# Patient Record
Sex: Male | Born: 1945 | Race: White | Hispanic: No | Marital: Married | Smoking: Former smoker
Health system: Southern US, Community
[De-identification: ages and names within clinical notes are randomized; demographics above are authoritative.]

## PROBLEM LIST (undated history)

## (undated) DIAGNOSIS — I10 Essential (primary) hypertension: Secondary | ICD-10-CM

## (undated) DIAGNOSIS — E785 Hyperlipidemia, unspecified: Secondary | ICD-10-CM

## (undated) DIAGNOSIS — Q231 Congenital insufficiency of aortic valve: Secondary | ICD-10-CM

## (undated) DIAGNOSIS — R7301 Impaired fasting glucose: Secondary | ICD-10-CM

## (undated) DIAGNOSIS — E119 Type 2 diabetes mellitus without complications: Secondary | ICD-10-CM

## (undated) DIAGNOSIS — I712 Thoracic aortic aneurysm, without rupture: Secondary | ICD-10-CM

## (undated) DIAGNOSIS — Q2381 Bicuspid aortic valve: Secondary | ICD-10-CM

## (undated) DIAGNOSIS — R972 Elevated prostate specific antigen [PSA]: Secondary | ICD-10-CM

## (undated) DIAGNOSIS — B351 Tinea unguium: Secondary | ICD-10-CM

## (undated) DIAGNOSIS — R011 Cardiac murmur, unspecified: Secondary | ICD-10-CM

## (undated) DIAGNOSIS — K219 Gastro-esophageal reflux disease without esophagitis: Secondary | ICD-10-CM

## (undated) DIAGNOSIS — I7121 Aneurysm of the ascending aorta, without rupture: Secondary | ICD-10-CM

## (undated) HISTORY — DX: Bicuspid aortic valve: Q23.81

## (undated) HISTORY — DX: Essential (primary) hypertension: I10

## (undated) HISTORY — DX: Congenital insufficiency of aortic valve: Q23.1

## (undated) HISTORY — DX: Hyperlipidemia, unspecified: E78.5

## (undated) HISTORY — DX: Impaired fasting glucose: R73.01

## (undated) HISTORY — DX: Type 2 diabetes mellitus without complications: E11.9

## (undated) HISTORY — PX: CATARACT EXTRACTION, BILATERAL: SHX1313

## (undated) HISTORY — DX: Elevated prostate specific antigen (PSA): R97.20

## (undated) SURGERY — Surgical Case
Anesthesia: *Unknown

---

## 2009-04-02 ENCOUNTER — Encounter: Admission: RE | Admit: 2009-04-02 | Discharge: 2009-04-02 | Payer: Self-pay | Admitting: Cardiology

## 2009-04-02 IMAGING — CT CT ANGIO CHEST
3 of 5 series · 17 of 30 positions shown · IV contrast (agent unspecified)
Comparison: None

CLINICAL DATA: History of bicuspid aortic valve, evaluate for
aneurysm

CT ANGIOGRAPHY CHEST WITH CONTRAST
TECHNIQUE: Multidetector CT imaging of the chest was performed
using the standard protocol during bolus administration of
intravenous contrast. Multiplanar CT image reconstructions
including MIPs were obtained to evaluate the vascular anatomy.
Contrast: 100 ml [VD]

[Series 5: angio · axial · 0.78mm/px · z∈[-263,-33]mm · 7 of 124 slices shown]
[im 16/124  lung]
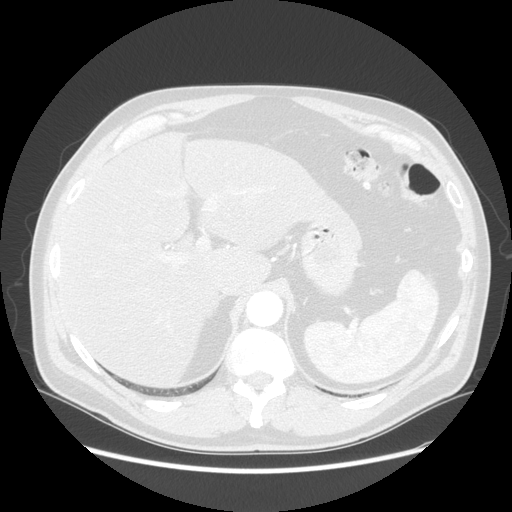
[im 31/124  mediastinal]
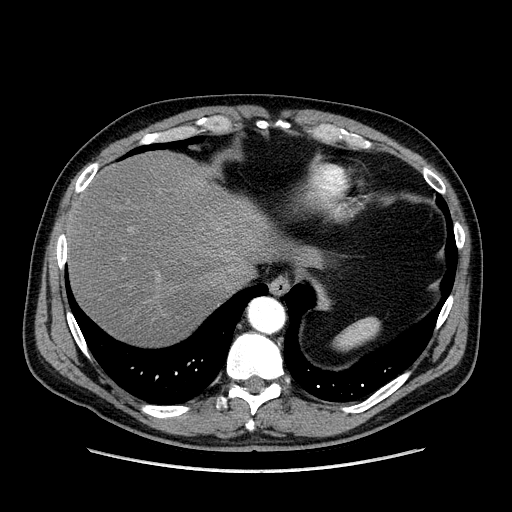
[im 47/124  lung]
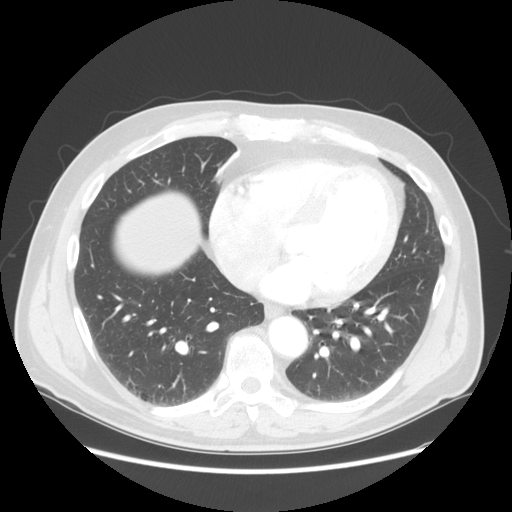
[im 62/124  mediastinal]
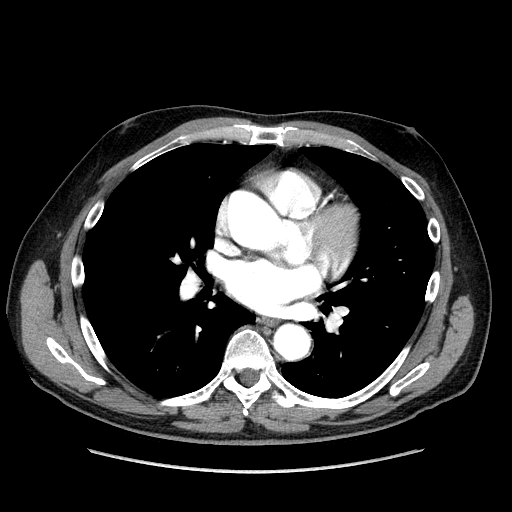
[im 77/124  lung]
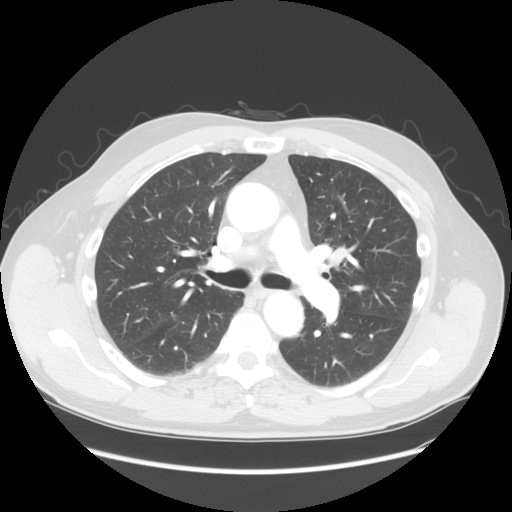
[im 93/124  mediastinal]
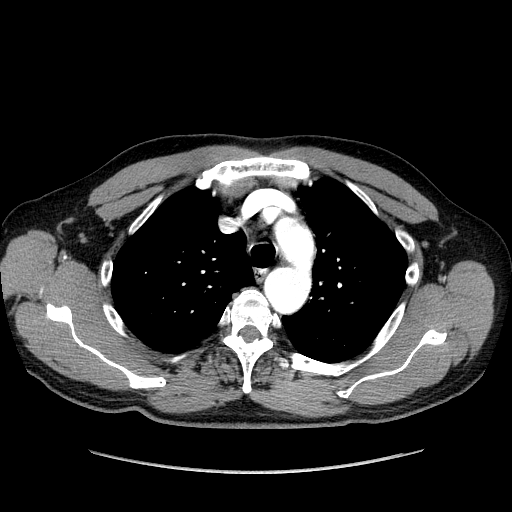
[im 108/124  lung]
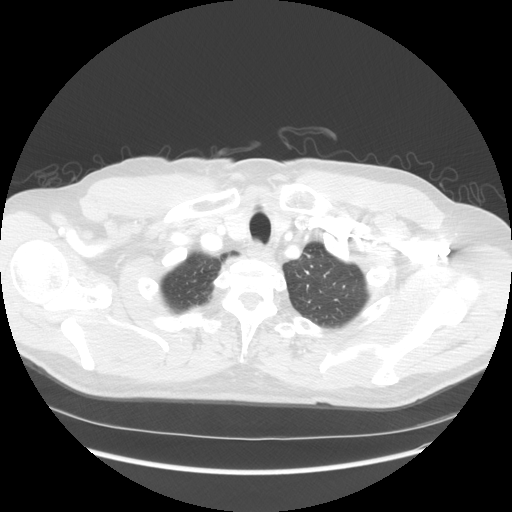

[Series 400: oblique · sagittal · 0.78mm/px · 2 of 50 slices shown]
[im 17/50  lung]
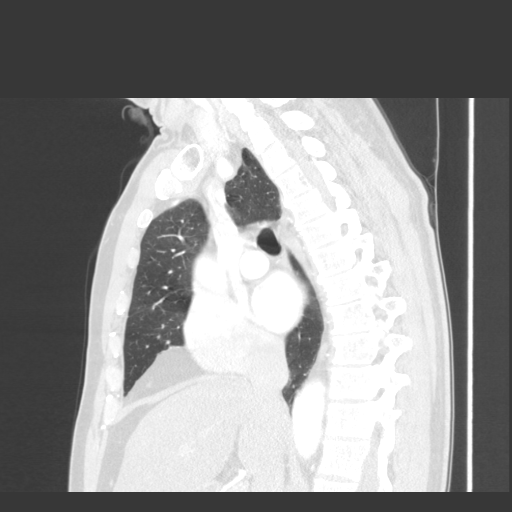
[im 33/50  lung]
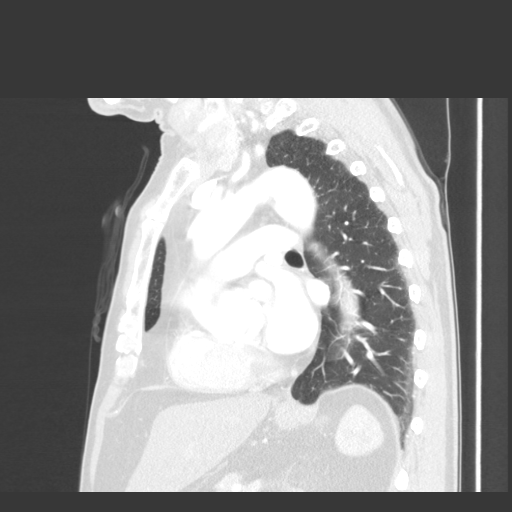

[Series 602: sagittal body · sagittal · 0.78mm/px · 8 of 161 slices shown]
[im 15/161  lung]
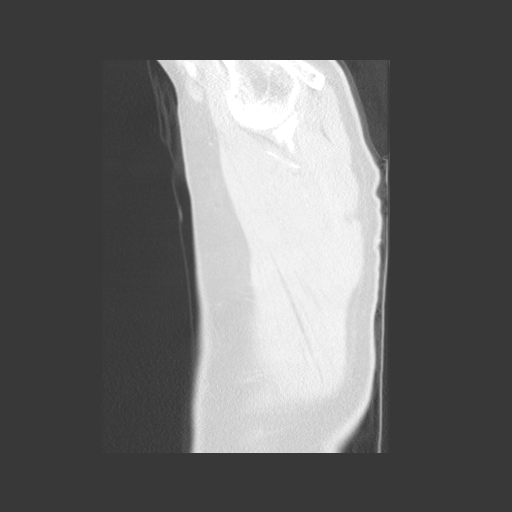
[im 30/161  lung]
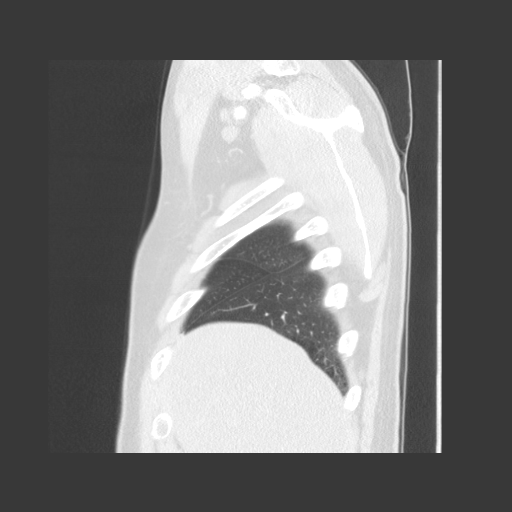
[im 59/161  lung]
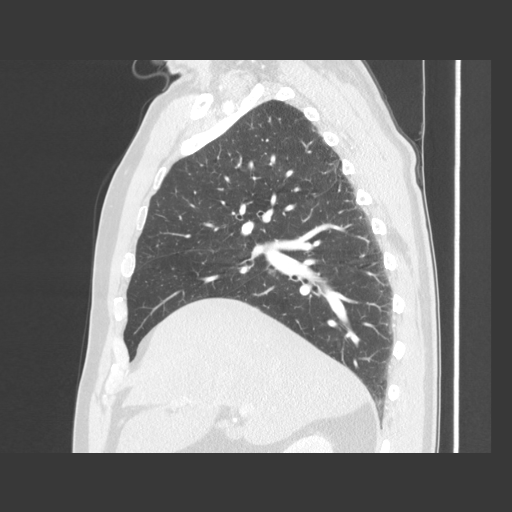
[im 73/161  lung]
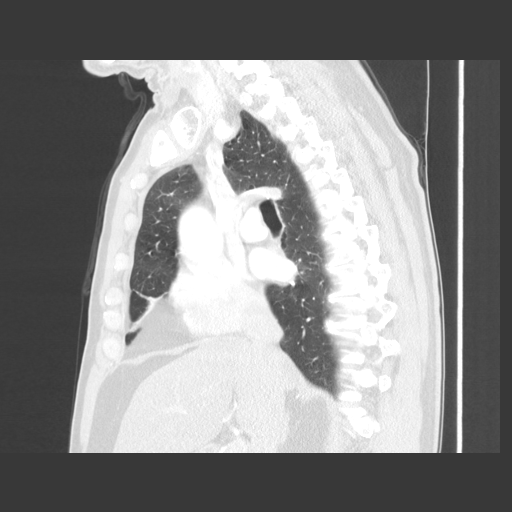
[im 88/161  lung]
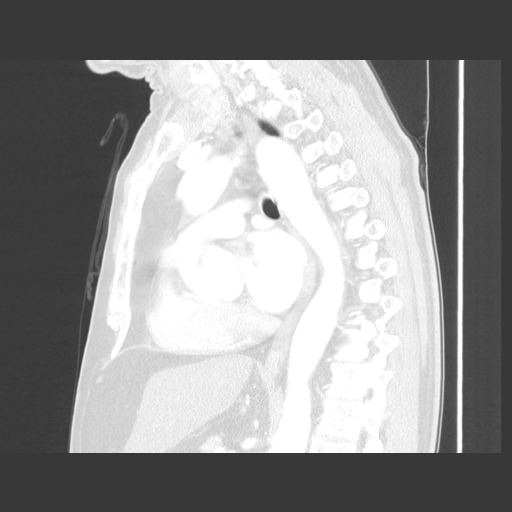
[im 102/161  lung]
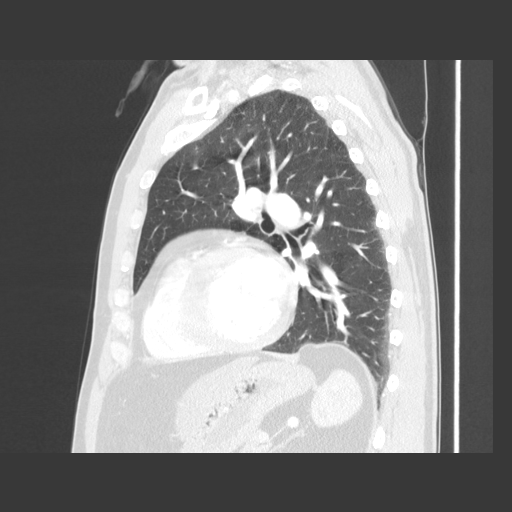
[im 131/161  lung]
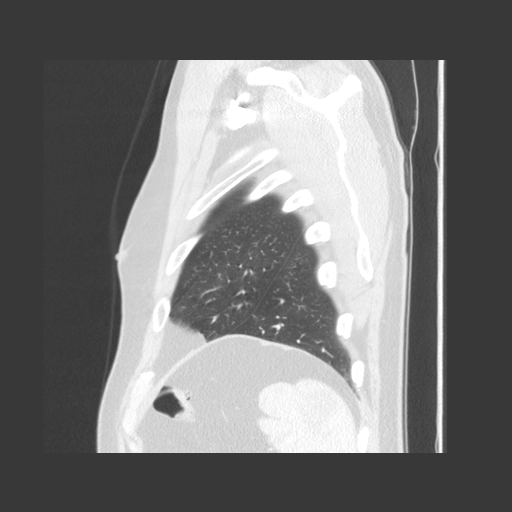
[im 146/161  lung]
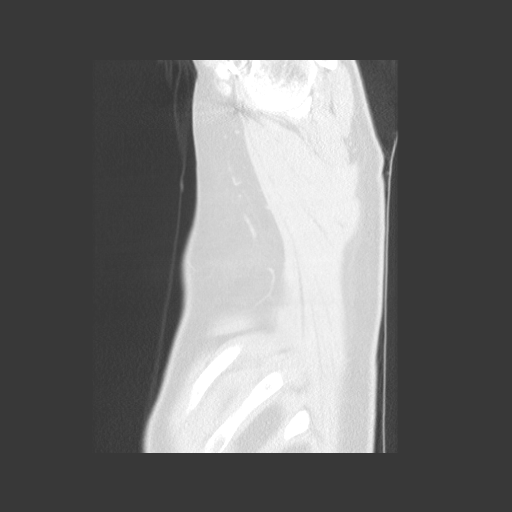

[17 of 30 positions shown; findings below may reference images not displayed]

FINDINGS: On the unenhanced study there is mild cardiomegaly
present with faint coronary artery calcification noted.  The liver
is somewhat low attenuation suggesting fatty infiltration.

The thoracic aorta opacifies well with no evidence of focal
aneurysm.  There is considerable cardiac motion obscuring detail at
the level of the aortic valve.  However no aneurysm is seen of the
sinus of Valsalva and the ascending aorta measures only 39 mm in
diameter at the level of the main pulmonary artery.  There is only
slight narrowing of the distal aortic arch but no definite evidence
of coarctation is identified.  The origins of the great vessels are
patent.  The descending thoracic aorta is normal in caliber.  The
pulmonary arteries opacify with no acute abnormality.

On the lung window images no lung parenchymal abnormality is seen.
No effusion is noted.  No bony abnormality is seen.

Review of the MIP images confirms the above findings.
IMPRESSION: 1.  No thoracic aortic aneurysm is seen.  The ascending aorta is
not significantly dilated. Cardiac motion does obscure the level of
the aortic valves.
2.  Minimal narrowing of the distal aortic large but no evidence of
coarctation is identified.

## 2015-09-20 DIAGNOSIS — Z1389 Encounter for screening for other disorder: Secondary | ICD-10-CM | POA: Insufficient documentation

## 2015-09-20 DIAGNOSIS — Z8042 Family history of malignant neoplasm of prostate: Secondary | ICD-10-CM | POA: Insufficient documentation

## 2015-09-23 ENCOUNTER — Telehealth: Payer: Self-pay | Admitting: Cardiology

## 2015-10-21 ENCOUNTER — Encounter: Payer: Self-pay | Admitting: Cardiology

## 2015-10-25 ENCOUNTER — Ambulatory Visit (INDEPENDENT_AMBULATORY_CARE_PROVIDER_SITE_OTHER): Payer: Medicare Other | Admitting: Cardiology

## 2015-10-25 ENCOUNTER — Encounter: Payer: Self-pay | Admitting: Cardiology

## 2015-10-25 VITALS — BP 130/58 | HR 51 | Ht 71.0 in | Wt 209.8 lb

## 2015-10-25 DIAGNOSIS — I10 Essential (primary) hypertension: Secondary | ICD-10-CM | POA: Insufficient documentation

## 2015-10-25 DIAGNOSIS — I350 Nonrheumatic aortic (valve) stenosis: Secondary | ICD-10-CM | POA: Insufficient documentation

## 2015-10-25 DIAGNOSIS — I351 Nonrheumatic aortic (valve) insufficiency: Secondary | ICD-10-CM

## 2015-10-25 DIAGNOSIS — R011 Cardiac murmur, unspecified: Secondary | ICD-10-CM | POA: Insufficient documentation

## 2015-10-25 DIAGNOSIS — Q231 Congenital insufficiency of aortic valve: Secondary | ICD-10-CM | POA: Insufficient documentation

## 2015-10-25 DIAGNOSIS — I77810 Thoracic aortic ectasia: Secondary | ICD-10-CM

## 2015-10-25 DIAGNOSIS — R7301 Impaired fasting glucose: Secondary | ICD-10-CM | POA: Insufficient documentation

## 2015-10-25 DIAGNOSIS — E7800 Pure hypercholesterolemia, unspecified: Secondary | ICD-10-CM | POA: Insufficient documentation

## 2015-10-25 DIAGNOSIS — E785 Hyperlipidemia, unspecified: Secondary | ICD-10-CM | POA: Insufficient documentation

## 2015-10-25 NOTE — Patient Instructions (Signed)

## 2015-10-25 NOTE — Progress Notes (Signed)
Cardiology Office Note    Date:  10/25/2015   ID:  Stephen Mullins, DOB 26-Jan-1946, MRN [id number]  PCP:  Gaye Alken, MD  Cardiologist:   Donato Schultz, MD     History of Present Illness:  Stephen Mullins is a 70 y.o. male with bicuspid aortic valve previously seen in 2010, CT angiogram performed at that time of aorta, no coarctation. 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  No symptoms, no chest pain, no syncope, no history of endocarditis, no shortness of breath. Gym 3 times a week. Cut grass.   Nonsmoker. Retired. Enjoying retired life.    Past Medical History  Diagnosis Date  . Hypertension   . Bicuspid aortic valve     with aortic stenosis and mild insufficiency, mild to moderate aortic root dilitation  . Borderline hyperlipidemia   . Elevated PSA     due to prostatitis  . Elevated fasting glucose     No past surgical history on file.  Outpatient Prescriptions Prior to Visit  Medication Sig Dispense Refill  . aspirin 81 MG tablet Take 81 mg by mouth daily.    . hydrochlorothiazide (HYDRODIURIL) 12.5 MG tablet Take 12.5 mg by mouth daily.    . metoprolol succinate (TOPROL-XL) 25 MG 24 hr tablet Take 25 mg by mouth daily.     No facility-administered medications prior to visit.     Allergies:   Tetanus toxoid adsorbed   Social History   Social History  . Marital Status: Married    Spouse Name: N/A  . Number of Children: N/A  . Years of Education: N/A   Occupational History  . retired    Social History Main Topics  . Smoking status: Former Smoker    Quit date: 10/20/2001  . Smokeless tobacco: None  . Alcohol Use: 0.0 oz/week    0 Standard drinks or equivalent per week  . Drug Use: No  . Sexual Activity: Not Asked   Other Topics Concern  . None   Social History Narrative     Family History:  The patient's family history includes Prostate cancer in his father.   ROS:   Please see the history of present illness.    ROS All  other systems reviewed and are negative.   PHYSICAL EXAM:   VS:  BP 130/58 mmHg  Pulse 51  Ht  (1.803 m)  Wt 209 lb 12.8 oz (95.165 kg)  BMI 29.27 kg/m2   GEN: Well nourished, well developed, in no acute distress HEENT: normal Neck: no JVD, +carotid bruits (radiation of murmur), or masses Cardiac: RRR; 3/6 systolic murmur RUSB, no rubs, or gallops,no edema  Respiratory:  clear to auscultation bilaterally, normal work of breathing GI: soft, nontender, nondistended, + BS MS: no deformity or atrophy Skin: warm and dry, no rash Neuro:  Alert and Oriented x 3, Strength and sensation are intact Psych: euthymic mood, full affect  Wt Readings from Last 3 Encounters:  10/25/15 209 lb 12.8 oz (95.165 kg)      Studies/Labs Reviewed:   EKG:  EKG is ordered today.  10/25/15-sinus bradycardia rate 51 with nonspecific ST-T wave changes, LVH.  Recent Labs: No results found for requested labs within last 365 days.   Lipid Panel No results found for: CHOL, TRIG, HDL, CHOLHDL, VLDL, LDLCALC, LDLDIRECT   Creatinine 0.9, ALT 28, LDL 144, HDL 40, hemoglobin A1c 6.1  Additional studies/ records that were reviewed today include:  Prior office note from 2010  reviewed.    ASSESSMENT:    1. Bicuspid aortic valve   2. Dilated aortic root (HCC)   3. Aortic stenosis   4. Aortic regurgitation   5. Hyperlipidemia      PLAN:  In order of problems listed above:  1. Bicuspid aortic valve-it is been approximately 7 years since last evaluation. We will check echocardiogram. Previously minimal aortic stenosis was noted. Prior CT scan of aorta showed no evidence of coarctation. 2. Essential hypertension-continue current medications. 3. Minimally dilated aortic root-39 mm at last check. Checking echocardiogram. 4. Hyperlipidemia-go ahead and continue with diet and exercise.    Medication Adjustments/Labs and Tests Ordered: Current medicines are reviewed at length with the patient today.   Concerns regarding medicines are outlined above.  Medication changes, Labs and Tests ordered today are listed in the Patient Instructions below. Patient Instructions  Medication Instructions:  The current medical regimen is effective;  continue present plan and medications.  Testing/Procedures: Your physician has requested that you have an echocardiogram. Echocardiography is a painless test that uses sound waves to create images of your heart. It provides your doctor with information about the size and shape of your heart and how well your heart's chambers and valves are working. This procedure takes approximately one hour. There are no restrictions for this procedure.  Follow-Up: Follow up in 1 year with Dr. Anne Fu.  You will receive a letter in the mail 2 months before you are due.  Please call us when you receive this letter to schedule your follow up appointment.  If you need a refill on your cardiac medications before your next appointment, please call your pharmacy.  Thank you for choosing [facility]!!             Signed, Donato Schultz, MD  10/25/2015 8:19 AM    [facility] [address] [place], [place], [place]  [address] Phone: [phone]; Fax: [phone]

## 2015-11-05 ENCOUNTER — Ambulatory Visit (HOSPITAL_COMMUNITY): Payer: Medicare Other | Attending: Cardiovascular Disease

## 2015-11-05 ENCOUNTER — Other Ambulatory Visit: Payer: Self-pay

## 2015-11-05 DIAGNOSIS — I77810 Thoracic aortic ectasia: Secondary | ICD-10-CM | POA: Diagnosis not present

## 2015-11-05 DIAGNOSIS — Z87891 Personal history of nicotine dependence: Secondary | ICD-10-CM | POA: Diagnosis not present

## 2015-11-05 DIAGNOSIS — I351 Nonrheumatic aortic (valve) insufficiency: Secondary | ICD-10-CM

## 2015-11-05 DIAGNOSIS — I340 Nonrheumatic mitral (valve) insufficiency: Secondary | ICD-10-CM | POA: Diagnosis not present

## 2015-11-05 DIAGNOSIS — E785 Hyperlipidemia, unspecified: Secondary | ICD-10-CM | POA: Insufficient documentation

## 2015-11-05 DIAGNOSIS — Q231 Congenital insufficiency of aortic valve: Secondary | ICD-10-CM | POA: Diagnosis not present

## 2015-11-05 DIAGNOSIS — I350 Nonrheumatic aortic (valve) stenosis: Secondary | ICD-10-CM | POA: Diagnosis not present

## 2015-11-05 DIAGNOSIS — I352 Nonrheumatic aortic (valve) stenosis with insufficiency: Secondary | ICD-10-CM | POA: Insufficient documentation

## 2015-11-05 DIAGNOSIS — I119 Hypertensive heart disease without heart failure: Secondary | ICD-10-CM | POA: Insufficient documentation

## 2015-11-05 DIAGNOSIS — I359 Nonrheumatic aortic valve disorder, unspecified: Secondary | ICD-10-CM | POA: Diagnosis present

## 2015-11-08 ENCOUNTER — Telehealth: Payer: Self-pay | Admitting: Cardiology

## 2015-11-08 NOTE — Telephone Encounter (Signed)
Returning call from this morning,he does not know who called. °

## 2015-11-08 NOTE — Telephone Encounter (Signed)
Notified of echo results.

## 2016-09-15 DIAGNOSIS — R69 Illness, unspecified: Secondary | ICD-10-CM | POA: Diagnosis not present

## 2016-10-13 DIAGNOSIS — E7800 Pure hypercholesterolemia, unspecified: Secondary | ICD-10-CM | POA: Diagnosis not present

## 2016-10-13 DIAGNOSIS — R7301 Impaired fasting glucose: Secondary | ICD-10-CM | POA: Diagnosis not present

## 2016-10-13 DIAGNOSIS — Z125 Encounter for screening for malignant neoplasm of prostate: Secondary | ICD-10-CM | POA: Diagnosis not present

## 2016-10-13 DIAGNOSIS — I10 Essential (primary) hypertension: Secondary | ICD-10-CM | POA: Diagnosis not present

## 2016-10-13 DIAGNOSIS — Q231 Congenital insufficiency of aortic valve: Secondary | ICD-10-CM | POA: Diagnosis not present

## 2017-01-25 DIAGNOSIS — B351 Tinea unguium: Secondary | ICD-10-CM | POA: Diagnosis not present

## 2017-02-02 ENCOUNTER — Ambulatory Visit (INDEPENDENT_AMBULATORY_CARE_PROVIDER_SITE_OTHER): Payer: Medicare HMO | Admitting: Podiatry

## 2017-02-02 ENCOUNTER — Encounter: Payer: Self-pay | Admitting: Podiatry

## 2017-02-02 DIAGNOSIS — Z79899 Other long term (current) drug therapy: Secondary | ICD-10-CM | POA: Diagnosis not present

## 2017-02-02 DIAGNOSIS — B351 Tinea unguium: Secondary | ICD-10-CM | POA: Diagnosis not present

## 2017-02-02 MED ORDER — TERBINAFINE HCL 250 MG PO TABS: 250.0000 mg | ORAL_TABLET | Freq: Every day | ORAL | 0 refills | Status: DC

## 2017-02-02 NOTE — Progress Notes (Signed)
   Subjective:    Patient ID: Stephen Mullins, male    DOB: 21-Mar-1946, 71 y.o.   MRN: [id number]  HPI  71 year old male presents the office if her concerns of fungus to his toenails. He states that he was on Lamisil previously to this has been over a year ago and that did help. He treatment 2016 as well as 2017 both with Lamisil. He states he is a 20 fungus for several years prior to this Lamisil did help but his stretching is reoccurrence. He is inquiring about doing one more round of Lamisil. He states the majority of his symptoms are post to the big toes as well as the third toes. He denies any pain in the toenails and denies any swelling redness or drainage. He states he appears a had a culture done. He states he understands the risks of Lamisil but wishes to do one more round.  Review of Systems  All other systems reviewed and are negative.      Objective:   Physical Exam General: AAO x3, NAD  Dermatological: Nails appear to be hypertrophic, dystrophic, discolored with yellow to brown discoloration most notably to the bilateral hallux nails as well as the third digit toenails. There is no surrounding redness or drainage or any signs of infection. There is no pain in the toenails. No open lesions are identified.  Vascular: Dorsalis Pedis artery and Posterior Tibial artery pedal pulses are 2/4 bilateral with immedate capillary fill time. Pedal hair growth present.There is no pain with calf compression, swelling, warmth, erythema.   Neruologic: Grossly intact via light touch bilateral. Vibratory intact via tuning fork bilateral. Protective threshold with Semmes Wienstein monofilament intact to all pedal sites bilateral.   Musculoskeletal: No gross boney pedal deformities bilateral. No pain, crepitus, or limitation noted with foot and ankle range of motion bilateral. Muscular strength 5/5 in all groups tested bilateral.  Gait: Unassisted, Nonantalgic.      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 At this point he understands the risks of Lamisil he states and he wishes to go ahead and proceed with another round. I discussed including one more round of 3 months however I would not continue after this point. He understands this. I did prescribe medication Lamisil stated I did order blood work. He has not started the medication; with results the blood work and he verbally understood this. His been over 1 year since he has been on Lamisil.  -We'll also try topical medicine which is ordered today through [facility] -Possible laser in the future -No guarantees given as the outcome but explained success rates for "cure"  -RTC 4 weeks to recheck blood work or sooner if needed.   Ovid CurdMatthew Dory Demont, DPM

## 2017-02-02 NOTE — Patient Instructions (Signed)

## 2017-02-03 LAB — CBC WITH DIFFERENTIAL/PLATELET
Basophils Absolute: 0 10*3/uL (ref 0.0–0.2)
Basos: 0 %
EOS (ABSOLUTE): 0.3 10*3/uL (ref 0.0–0.4)
EOS: 3 %
HEMATOCRIT: 47.5 % (ref 37.5–51.0)
HEMOGLOBIN: 16.3 g/dL (ref 13.0–17.7)
Immature Grans (Abs): 0 10*3/uL (ref 0.0–0.1)
Immature Granulocytes: 0 %
LYMPHS ABS: 1.6 10*3/uL (ref 0.7–3.1)
Lymphs: 19 %
MCH: 29.7 pg (ref 26.6–33.0)
MCHC: 34.3 g/dL (ref 31.5–35.7)
MCV: 87 fL (ref 79–97)
MONOCYTES: 7 %
Monocytes Absolute: 0.6 10*3/uL (ref 0.1–0.9)
NEUTROS ABS: 5.7 10*3/uL (ref 1.4–7.0)
Neutrophils: 71 %
Platelets: 230 10*3/uL (ref 150–379)
RBC: 5.49 x10E6/uL (ref 4.14–5.80)
RDW: 13.4 % (ref 12.3–15.4)
WBC: 8.2 10*3/uL (ref 3.4–10.8)

## 2017-02-03 LAB — HEPATIC FUNCTION PANEL
ALBUMIN: 4.4 g/dL (ref 3.5–4.8)
ALT: 23 IU/L (ref 0–44)
AST: 22 IU/L (ref 0–40)
Alkaline Phosphatase: 52 IU/L (ref 39–117)
BILIRUBIN TOTAL: 0.6 mg/dL (ref 0.0–1.2)
BILIRUBIN, DIRECT: 0.16 mg/dL (ref 0.00–0.40)
TOTAL PROTEIN: 7 g/dL (ref 6.0–8.5)

## 2017-02-04 ENCOUNTER — Telehealth: Payer: Self-pay | Admitting: *Deleted

## 2017-02-04 MED ORDER — NONFORMULARY OR COMPOUNDED ITEM: 2 refills | Status: DC

## 2017-02-04 NOTE — Telephone Encounter (Addendum)
-----   Message from Vivi BarrackMatthew R Wagoner, DPM sent at 02/03/2017  1:55 PM EDT ----- Lab work normal- ok to do lamisil. Please let him know. 02/04/2017-I informed pt of Dr. Gabriel RungWagoner's review of results and orders. Pt states Dr. Ardelle AntonWagoner had also [place]. I reviewed LOV 02/02/2017 and Dr. Ardelle AntonWagoner had ordered [facility] Onychomycosis nail lacquer. [facility] to [facility].

## 2017-03-02 ENCOUNTER — Ambulatory Visit (INDEPENDENT_AMBULATORY_CARE_PROVIDER_SITE_OTHER): Payer: Medicare HMO | Admitting: Podiatry

## 2017-03-02 ENCOUNTER — Encounter: Payer: Self-pay | Admitting: Podiatry

## 2017-03-02 DIAGNOSIS — B351 Tinea unguium: Secondary | ICD-10-CM | POA: Diagnosis not present

## 2017-03-02 DIAGNOSIS — Z79899 Other long term (current) drug therapy: Secondary | ICD-10-CM

## 2017-03-02 NOTE — Progress Notes (Signed)
Subjective: 71 year old male presents the office they for follow-up evaluation of nail fungus. He states his been on Lamisil he is having no issues taking the medication. He is also been applying topical medication. He has no new concerns today. Denies any pain or swelling or any signs of infection to the toenails. Denies any systemic complaints such as fevers, chills, nausea, vomiting. No acute changes since last appointment, and no other complaints at this time.   Objective: AAO x3, NAD DP/PT pulses palpable bilaterally, CRT less than 3 seconds Nails are very dystrophic, discolored, hypertrophic and yellowed upon discoloration. There is some darkened discoloration within the right hallux toenail. Upon debridement I was able to remove subungual hematoma which is old. There is no extension of hyperpigmentation around the surrounding skin to the underlying nail bed. There is no pain in the nails there is no swelling redness or drainage.  No open lesions or pre-ulcerative lesions.  No pain with calf compression, swelling, warmth, erythema  Assessment: Onychomycosis  Plan: -All treatment options discussed with the patient including all alternatives, risks, complications.  -Recommended he continue with Lamisil. He has no side effects but continue to monitor closely. I did repeat blood work today. Continue topical medication. I'll see him back in 4 weeks and likely repeat blood work again. -Patient encouraged to call the office with any questions, concerns, change in symptoms.   Ovid CurdMatthew Wagoner, DPM

## 2017-03-02 NOTE — Patient Instructions (Signed)

## 2017-03-03 LAB — CBC WITH DIFFERENTIAL/PLATELET
BASOS ABS: 0 10*3/uL (ref 0.0–0.2)
Basos: 0 %
EOS (ABSOLUTE): 0.3 10*3/uL (ref 0.0–0.4)
Eos: 3 %
Hematocrit: 47 % (ref 37.5–51.0)
Hemoglobin: 15.7 g/dL (ref 13.0–17.7)
IMMATURE GRANS (ABS): 0 10*3/uL (ref 0.0–0.1)
IMMATURE GRANULOCYTES: 0 %
LYMPHS: 23 %
Lymphocytes Absolute: 1.7 10*3/uL (ref 0.7–3.1)
MCH: 29.4 pg (ref 26.6–33.0)
MCHC: 33.4 g/dL (ref 31.5–35.7)
MCV: 88 fL (ref 79–97)
MONOS ABS: 0.5 10*3/uL (ref 0.1–0.9)
Monocytes: 7 %
NEUTROS PCT: 67 %
Neutrophils Absolute: 5.2 10*3/uL (ref 1.4–7.0)
PLATELETS: 232 10*3/uL (ref 150–379)
RBC: 5.34 x10E6/uL (ref 4.14–5.80)
RDW: 13.6 % (ref 12.3–15.4)
WBC: 7.8 10*3/uL (ref 3.4–10.8)

## 2017-03-03 LAB — HEPATIC FUNCTION PANEL
ALBUMIN: 4.3 g/dL (ref 3.5–4.8)
ALT: 26 IU/L (ref 0–44)
AST: 23 IU/L (ref 0–40)
Alkaline Phosphatase: 49 IU/L (ref 39–117)
BILIRUBIN TOTAL: 0.5 mg/dL (ref 0.0–1.2)
BILIRUBIN, DIRECT: 0.12 mg/dL (ref 0.00–0.40)
TOTAL PROTEIN: 7.2 g/dL (ref 6.0–8.5)

## 2017-03-04 NOTE — Telephone Encounter (Addendum)
-----   Message from Vivi BarrackMatthew R Wagoner, DPM sent at 03/03/2017  9:22 AM EDT ----- Blood work normal- please let him know. Continue lamisil. Follow up in 4 weeks. 03/04/2017-I informed pt of Dr. Gabriel RungWagoner's review of results and orders.

## 2017-03-18 DIAGNOSIS — R69 Illness, unspecified: Secondary | ICD-10-CM | POA: Diagnosis not present

## 2017-03-30 ENCOUNTER — Encounter: Payer: Self-pay | Admitting: Podiatry

## 2017-03-30 ENCOUNTER — Ambulatory Visit (INDEPENDENT_AMBULATORY_CARE_PROVIDER_SITE_OTHER): Payer: Medicare HMO | Admitting: Podiatry

## 2017-03-30 DIAGNOSIS — B351 Tinea unguium: Secondary | ICD-10-CM | POA: Diagnosis not present

## 2017-03-30 DIAGNOSIS — Z79899 Other long term (current) drug therapy: Secondary | ICD-10-CM | POA: Diagnosis not present

## 2017-03-30 NOTE — Progress Notes (Signed)
Subjective: 71 year old male presents the office they for follow-up evaluation of nail fungus. He has continued  on Lamisil he is having no issues taking the medication. He is also been applying topical medication. He has no new concerns today. Denies any pain or swelling or any signs of infection to the toenails. Denies any systemic complaints such as fevers, chills, nausea, vomiting. No acute changes since last appointment, and no other complaints at this time.   Objective: AAO x3, NAD DP/PT pulses palpable bilaterally, CRT less than 3 seconds Nails are very dystrophic, discolored, hypertrophic and yellowed upon discoloration. There is some darkened discoloration within the LEFT hallux toenail. Upon debridement I was able to remove subungual hematoma which is old. His has been ongoing for several years he sates.There is no extension of hyperpigmentation around the surrounding skin to the underlying nail bed. There is no pain in the nails there is no swelling redness or drainage.  No open lesions or pre-ulcerative lesions.  No pain with calf compression, swelling, warmth, erythema  Assessment: Onychomycosis  Plan: -All treatment options discussed with the patient including all alternatives, risks, complications.  -I digit debrided the left hallux toenail from the left hallux toenail to do a biopsy this given the longevity has been present. It does appear to be old subungual hematoma however. -Recommended he continue with Lamisil. He has no side effects but continue to monitor closely. I did repeat blood work today. Continue topical medication. I'll see him back in 4 weeks and likely repeat blood work again. -Patient encouraged to call the office with any questions, concerns, change in symptoms.   *Patient did show me his back today which there does appear to be a cyst or start of a boil to his back. Recommended him to see his PCP  Ovid CurdMatthew Wagoner, DPM

## 2017-03-30 NOTE — Patient Instructions (Signed)

## 2017-03-30 NOTE — Addendum Note (Signed)
Addended by: Hadley PenOX, Garnett Rekowski R on: 03/30/2017 12:12 PM   Modules accepted: Orders

## 2017-03-31 LAB — CBC WITH DIFFERENTIAL/PLATELET
BASOS ABS: 0 10*3/uL (ref 0.0–0.2)
Basos: 0 %
EOS (ABSOLUTE): 0.2 10*3/uL (ref 0.0–0.4)
Eos: 2 %
HEMOGLOBIN: 15.5 g/dL (ref 13.0–17.7)
Hematocrit: 46.1 % (ref 37.5–51.0)
IMMATURE GRANS (ABS): 0 10*3/uL (ref 0.0–0.1)
IMMATURE GRANULOCYTES: 0 %
LYMPHS: 19 %
Lymphocytes Absolute: 1.6 10*3/uL (ref 0.7–3.1)
MCH: 29.8 pg (ref 26.6–33.0)
MCHC: 33.6 g/dL (ref 31.5–35.7)
MCV: 89 fL (ref 79–97)
MONOCYTES: 8 %
Monocytes Absolute: 0.7 10*3/uL (ref 0.1–0.9)
NEUTROS ABS: 5.9 10*3/uL (ref 1.4–7.0)
NEUTROS PCT: 71 %
Platelets: 234 10*3/uL (ref 150–379)
RBC: 5.21 x10E6/uL (ref 4.14–5.80)
RDW: 13.7 % (ref 12.3–15.4)
WBC: 8.4 10*3/uL (ref 3.4–10.8)

## 2017-03-31 LAB — HEPATIC FUNCTION PANEL
ALT: 27 IU/L (ref 0–44)
AST: 23 IU/L (ref 0–40)
Albumin: 4.4 g/dL (ref 3.5–4.8)
Alkaline Phosphatase: 52 IU/L (ref 39–117)
BILIRUBIN TOTAL: 0.6 mg/dL (ref 0.0–1.2)
Bilirubin, Direct: 0.15 mg/dL (ref 0.00–0.40)
Total Protein: 7.2 g/dL (ref 6.0–8.5)

## 2017-04-02 ENCOUNTER — Telehealth: Payer: Self-pay | Admitting: *Deleted

## 2017-04-02 NOTE — Telephone Encounter (Addendum)
-----   Message from Vivi BarrackMatthew R Wagoner, DPM sent at 03/31/2017  3:58 PM EDT ----- Labs normal; please let him know he can continue Lamisil. Thank you.04/02/2017-Left message informing pt of Dr. Gabriel RungWagoner's review of results and orders.

## 2017-04-27 ENCOUNTER — Ambulatory Visit (INDEPENDENT_AMBULATORY_CARE_PROVIDER_SITE_OTHER): Payer: Medicare HMO | Admitting: Podiatry

## 2017-04-27 DIAGNOSIS — Z79899 Other long term (current) drug therapy: Secondary | ICD-10-CM | POA: Diagnosis not present

## 2017-04-27 DIAGNOSIS — B351 Tinea unguium: Secondary | ICD-10-CM | POA: Diagnosis not present

## 2017-04-27 MED ORDER — TERBINAFINE HCL 250 MG PO TABS: 250.0000 mg | ORAL_TABLET | Freq: Every day | ORAL | 0 refills | Status: DC

## 2017-04-27 NOTE — Patient Instructions (Signed)

## 2017-04-27 NOTE — Progress Notes (Signed)
Subjective: 71 year old male presents the office they for follow-up evaluation of nail fungus. He has continued on Lamisil he is having no issues taking the medication. He has almost completed 3 months. He is also been applying topical medication that he received from [facility]. He has no new concerns today. Denies any pain or swelling or any signs of infection to the toenails. Denies any systemic complaints such as fevers, chills, nausea, vomiting. No acute changes since last appointment, and no other complaints at this time.   Objective: AAO x3, NAD DP/PT pulses palpable bilaterally, CRT less than 3 seconds Nails are very dystrophic, discolored, hypertrophic and yellowed upon discoloration. There doesn't be evidence of clearing on the proximal nail borders. There is some darkened discoloration within the LEFT hallux toenail consistent with subungual hematoma. There is no extension of hyperpigmentation around the surrounding skin to the underlying nail bed. There is no pain in the nails there is no swelling redness or drainage.  No open lesions or pre-ulcerative lesions.  No pain with calf compression, swelling, warmth, erythema  Assessment: Onychomycosis  Plan: -All treatment options discussed with the patient including all alternatives, risks, complications.  -At this point the nails are improving. Discussed him continuing Lamisil for 30 more days for a total 4 months. He wishes to do this. Continue to monitor side effects. Repeated blood work today. -Continue topical antifungal as well. -Also continue preventive measures to help spreading and discussed shoe changes, cleaning shoes and showers as well.  Ovid Curd, DPM

## 2017-04-28 LAB — CBC WITH DIFFERENTIAL/PLATELET
BASOS: 1 %
Basophils Absolute: 0 10*3/uL (ref 0.0–0.2)
EOS (ABSOLUTE): 0.2 10*3/uL (ref 0.0–0.4)
EOS: 3 %
HEMATOCRIT: 44.9 % (ref 37.5–51.0)
HEMOGLOBIN: 15.4 g/dL (ref 13.0–17.7)
IMMATURE GRANS (ABS): 0 10*3/uL (ref 0.0–0.1)
IMMATURE GRANULOCYTES: 0 %
Lymphocytes Absolute: 1.8 10*3/uL (ref 0.7–3.1)
Lymphs: 23 %
MCH: 29.6 pg (ref 26.6–33.0)
MCHC: 34.3 g/dL (ref 31.5–35.7)
MCV: 86 fL (ref 79–97)
MONOCYTES: 7 %
MONOS ABS: 0.6 10*3/uL (ref 0.1–0.9)
NEUTROS PCT: 66 %
Neutrophils Absolute: 5.3 10*3/uL (ref 1.4–7.0)
Platelets: 258 10*3/uL (ref 150–379)
RBC: 5.21 x10E6/uL (ref 4.14–5.80)
RDW: 13.7 % (ref 12.3–15.4)
WBC: 7.9 10*3/uL (ref 3.4–10.8)

## 2017-04-28 LAB — HEPATIC FUNCTION PANEL
ALBUMIN: 4.2 g/dL (ref 3.5–4.8)
ALK PHOS: 50 IU/L (ref 39–117)
ALT: 22 IU/L (ref 0–44)
AST: 22 IU/L (ref 0–40)
Bilirubin Total: 0.4 mg/dL (ref 0.0–1.2)
Bilirubin, Direct: 0.11 mg/dL (ref 0.00–0.40)
TOTAL PROTEIN: 7.1 g/dL (ref 6.0–8.5)

## 2017-04-30 ENCOUNTER — Telehealth: Payer: Self-pay | Admitting: *Deleted

## 2017-04-30 NOTE — Telephone Encounter (Addendum)
-----   Message from Vivi Barrack, DPM sent at 04/28/2017 10:44 AM EDT ----- Lab work is normal- please let him know. Can continue Lamisil. Thanks.04/30/2017-I informed pt of Dr. Gabriel Rung review of results and orders.

## 2017-05-17 ENCOUNTER — Ambulatory Visit: Payer: Medicare HMO | Admitting: Podiatry

## 2017-05-24 ENCOUNTER — Encounter: Payer: Self-pay | Admitting: Podiatry

## 2017-05-24 ENCOUNTER — Ambulatory Visit (INDEPENDENT_AMBULATORY_CARE_PROVIDER_SITE_OTHER): Payer: Medicare HMO | Admitting: Podiatry

## 2017-05-24 DIAGNOSIS — B351 Tinea unguium: Secondary | ICD-10-CM

## 2017-05-24 NOTE — Progress Notes (Signed)
Subjec24tive: 71 year old male presents the office they for follow-up evaluation of nail fungus. He will finish his fourth month of Lamisil next week. Continue she denies any side effects the medication. He states the nails are looking better but still thickened discolored. He states that this has been ongoing issue and that he is in an uphill battle with the nails. Denies any pain to the toenails and denies any surrounding redness or drainage or any swelling. He has no other concerns today.   Objective: AAO x3, NAD DP/PT pulses palpable bilaterally, CRT less than 3 seconds Nails are very dystrophic, discolored, hypertrophic and yellowed upon discoloration. There is evidence of clear on the proximal nail borders. There is no pain in the nails there is no swelling redness or drainage or any clinical signs of infection present. No evidence of tinea pedis today.   No open lesions or pre-ulcerative lesions.  No pain with calf compression, swelling, warmth, erythema  Assessment: Onychomycosiswith some improvement   Plan: -All treatment options discussed with the patient including all alternatives, risks, complications -Finish course of Lamisil this point we do not do any further Lamisil. His continue with the topical antifungal. Redness continue this daily. Also discussed external factors to help with the fungus. -Follow-up in 3 months to recheck the nails. Discussed with him laser therapy other treatment options. He will hold off on this for now and continue with topical.   Ovid Curd, DPM

## 2017-06-14 DIAGNOSIS — L820 Inflamed seborrheic keratosis: Secondary | ICD-10-CM | POA: Diagnosis not present

## 2017-06-14 DIAGNOSIS — L720 Epidermal cyst: Secondary | ICD-10-CM | POA: Diagnosis not present

## 2017-06-23 ENCOUNTER — Encounter: Payer: Self-pay | Admitting: Podiatry

## 2017-06-23 ENCOUNTER — Ambulatory Visit (INDEPENDENT_AMBULATORY_CARE_PROVIDER_SITE_OTHER): Payer: Medicare HMO

## 2017-06-23 ENCOUNTER — Ambulatory Visit (INDEPENDENT_AMBULATORY_CARE_PROVIDER_SITE_OTHER): Payer: Medicare HMO | Admitting: Podiatry

## 2017-06-23 DIAGNOSIS — M722 Plantar fascial fibromatosis: Secondary | ICD-10-CM

## 2017-06-23 NOTE — Patient Instructions (Signed)

## 2017-06-24 NOTE — Progress Notes (Signed)
Subjective: Stephen Mullins presents the office today for new concerns of pain in the bottom of his left heel. He believes that he has plantar fasciitis fasciitis before. He states that he has pain if he sits for some time and stands back up. He denies any recent injury or trauma. No numbness or tingling. He has been stretching as well as taking anti-inflammatories as his been helping some but does continue. He is no other concerns today. He's having other issues of this toenails may continue to grow out. Denies any systemic complaints such as fevers, chills, nausea, vomiting. No acute changes since last appointment, and no other complaints at this time.   Objective: AAO x3, NAD DP/PT pulses palpable bilaterally, CRT less than 3 seconds Tenderness to palpation along the plantar medial tubercle of the calcaneus at the insertion of plantar fascia on the left foot. There is no pain along the course of the plantar fascia within the arch of the foot. Plantar fascia appears to be intact. There is no pain with lateral compression of the calcaneus or pain with vibratory sensation. There is no pain along the course or insertion of the achilles tendon. No other areas of tenderness to bilateral lower extremities. Negative tinel sign No pain on the toenails may continue to grow out. Overall improvement except the left great toe with only minimal improvement. No open lesions or pre-ulcerative lesions.  No pain with calf compression, swelling, warmth, erythema  Assessment: Left heel pain, likely plantar fasciitis  Plan: -All treatment options discussed with the patient including all alternatives, risks, complications.  -Etiology of symptoms were discussed -X-rays were obtained and reviewed with the patient. Calcaneal spurring is present. No evidence of acute fracture identified today. -Patient elects to proceed with steroid injection into the left heel. Under sterile skin preparation, a total of 2.5cc of kenalog 10, 0.5%  Marcaine plain, and 2% lidocaine plain were infiltrated into the symptomatic area without complication. A band-aid was applied. Patient tolerated the injection well without complication. Post-injection care with discussed with the patient. Discussed with the patient to ice the area over the next couple of days to help prevent a steroid flare.  -Plantar fascial brace was dispensed -Stretching, icing exercises daily. -Continue anti-inflammatories as needed -Discussed shoe gear changes and inserts -Follow up in 3 weeks if symptoms continue or sooner if needed. -Patient encouraged to call the office with any questions, concerns, change in symptoms.   Stephen Mullins, DPM

## 2017-06-25 DIAGNOSIS — M722 Plantar fascial fibromatosis: Secondary | ICD-10-CM | POA: Insufficient documentation

## 2017-07-15 ENCOUNTER — Encounter: Payer: Self-pay | Admitting: Podiatry

## 2017-07-15 ENCOUNTER — Ambulatory Visit: Payer: Medicare HMO | Admitting: Podiatry

## 2017-07-15 DIAGNOSIS — M722 Plantar fascial fibromatosis: Secondary | ICD-10-CM | POA: Diagnosis not present

## 2017-07-15 DIAGNOSIS — B351 Tinea unguium: Secondary | ICD-10-CM

## 2017-07-15 NOTE — Progress Notes (Signed)
Subjective: Stephen Mullins presents to the office today for follow-up evaluation of left heel pain. They state that they are doing better but the pain has moved. They have been icing, stretching, try to wear supportive shoe as much as possible.  He also feels that the toenail fungus is doing better.  He is asking if he should continue with the topical antifungal.  He denies any pain or redness or swelling to the toenail sites.  No other complaints at this time. No acute changes since last appointment. They deny any systemic complaints such as fevers, chills, nausea, vomiting.  Objective: General: AAO x3, NAD  Dermatological: Skin is warm, dry and supple bilateral. There are no open sores, no preulcerative lesions, no rash or signs of infection present.  Overall the toenails appear to be improved.  The left hallux toenail is still somewhat discolored with yellow discoloration of the other toenails are improved.  There is no pain, swelling, redness or drainage coming from the toenail sites.  Vascular: Dorsalis Pedis artery and Posterior Tibial artery pedal pulses are 2/4 bilateral with immedate capillary fill time. Pedal hair growth present. There is no pain with calf compression, swelling, warmth, erythema.   Neruologic: Grossly intact via light touch bilateral.   Musculoskeletal: There is improved but still continue tenderness palpation along the plantar  tubercle of the calcaneus at the insertion of the plantar fascia on the left foot, more along the lateral aspect. There is no pain along the course of the plantar fascia within the arch of the foot. Plantar fascia appears to be intact bilaterally. There is no pain with lateral compression of the calcaneus and there is no pain with vibratory sensation. There is no pain along the course or insertion of the Achilles tendon. There are no other areas of tenderness to bilateral lower extremities. No gross boney pedal deformities bilateral. No pain, crepitus,  or limitation noted with foot and ankle range of motion bilateral. Muscular strength 5/5 in all groups tested bilateral.  Gait: Unassisted, Nonantalgic.   Assessment: Presents for follow-up evaluation for heel pain, likely plantar fasciitis ; onychomycosis  Plan: -Treatment options discussed including all alternatives, risks, and complications -Patient elects to proceed with steroid injection into the left heel, more along the lateral aspect. Under sterile skin preparation, a total of 2.5cc of kenalog 10, 0.5% Marcaine plain, and 2% lidocaine plain were infiltrated into the symptomatic area without complication. A band-aid was applied. Patient tolerated the injection well without complication. Post-injection care with discussed with the patient. Discussed with the patient to ice the area over the next couple of days to help prevent a steroid flare.  -Ice and stretching exercises on a daily basis. -Continue supportive shoe gear. He has OTC inserts in his shoes -I want him to continue the topical antifungal.  I reordered-this today through [facility]. -Follow-up in 3-4 weeks or sooner if any problems arise. In the meantime, encouraged to call the office with any questions, concerns, change in symptoms.   Ovid CurdMatthew Floriene Jeschke, DPM

## 2017-08-05 ENCOUNTER — Ambulatory Visit: Payer: Medicare HMO | Admitting: Podiatry

## 2017-08-20 DIAGNOSIS — M722 Plantar fascial fibromatosis: Secondary | ICD-10-CM | POA: Diagnosis not present

## 2017-08-23 ENCOUNTER — Ambulatory Visit: Payer: Medicare HMO | Admitting: Podiatry

## 2017-08-23 DIAGNOSIS — M722 Plantar fascial fibromatosis: Secondary | ICD-10-CM | POA: Diagnosis not present

## 2017-08-23 NOTE — Progress Notes (Signed)
Subjective: Stephen Mullins presents the office today for follow-up evaluation of left heel pain, plantar fasciitis.  He states that overall of the second injection he noticed some good improvement but over the last couple weeks he is stated about the same.  He still gets some discomfort in the bottom of the heel.  Overall he is doing better and slowly improving but it does continue.  He denies any numbness or tingling.  The pain is worse in the morning and he first gets up.  The pain is not waking up at night.  He has been stretching icing as well. Denies any systemic complaints such as fevers, chills, nausea, vomiting. No acute changes since last appointment, and no other complaints at this time.  He is to hold off on ibuprofen as he has been taking this consistently for the last couple weeks but he is not having any side effects from medication he reports.  Objective: AAO x3, NAD DP/PT pulses palpable bilaterally, CRT less than 3 seconds There is improved but continued tenderness to palpation along the plantar medial tubercle of the calcaneus at the insertion of plantar fascia on the left foot. There is no pain along the course of the plantar fascia within the arch of the foot. Plantar fascia appears to be intact. There is no pain with lateral compression of the calcaneus or pain with vibratory sensation. There is no pain along the course or insertion of the achilles tendon. No other areas of tenderness to bilateral lower extremities. Negative tinel sign No open lesions or pre-ulcerative lesions.  No pain with calf compression, swelling, warmth, erythema  Assessment: 71 year old male with continued left heel pain, plantar fasciitis  Plan: -All treatment options discussed with the patient including all alternatives, risks, complications.  -Discussed the third steroid injection he wishes to proceed with this.  See procedure note below. -Night splint dispensed -Discussed shoe gear modifications.  He did  purchase new over-the-counter inserts last week.  Continue with this. -Continue stretching, icing daily. -Discussed other options including PT and EPAT -Patient encouraged to call the office with any questions, concerns, change in symptoms.   Vivi BarrackMatthew R Wagoner DPM

## 2017-09-15 DIAGNOSIS — R69 Illness, unspecified: Secondary | ICD-10-CM | POA: Diagnosis not present

## 2017-09-23 ENCOUNTER — Encounter: Payer: Self-pay | Admitting: Podiatry

## 2017-09-23 ENCOUNTER — Ambulatory Visit: Payer: Medicare HMO | Admitting: Podiatry

## 2017-09-23 DIAGNOSIS — M722 Plantar fascial fibromatosis: Secondary | ICD-10-CM

## 2017-09-23 NOTE — Patient Instructions (Signed)

## 2017-09-24 NOTE — Progress Notes (Signed)
Subjective: Stephen Mullins presents the office today for follow-up evaluation of left heel pain, plantar fasciitis.  He states that he thinks the area is  "almost healed". He states that he is doing much better in the max his pain level is about a 1/10 today.  He states that he has not been having any pain on a daily basis except for he did walk an entire round of golf and he had some pain afterwards but after sitting to rest his pain resolved.  He has been icing intermittently but has not been stretching or using the brace.  He has no other concerns today.  Objective: AAO x3, NAD DP/PT pulses palpable bilaterally, CRT less than 3 seconds There is currently no discomfort to the palpation along the plantar medial tubercle of the calcaneus at the insertion of plantar fascia on the left foot. There is no pain along the course of the plantar fascia within the arch of the foot. Plantar fascia appears to be intact. There is no pain with lateral compression of the calcaneus or pain with vibratory sensation. There is no pain along the course or insertion of the achilles tendon. No other areas of tenderness to bilateral lower extremities. Negative tinel sign No open lesions or pre-ulcerative lesions.  No pain with calf compression, swelling, warmth, erythema  Assessment: 72 year old male with resolved left heel pain, plantar fasciitis  Plan: -All treatment options discussed with the patient including all alternatives, risks, complications.  -At this point he is having very minimal discomfort.  We discussed measures to help prevent reoccurrence him to continue with stretching, icing on a daily basis as well as the inserts that he has inside of his shoes.  Also discussed supportive shoes.  There is any reoccurrence or any issues she is going to call the office for follow-up otherwise I will see him back as needed and he agrees this plan has no further questions or concerns today.  Stephen BarrackMatthew R Mustapha Mullins DPM

## 2017-10-05 DIAGNOSIS — E7800 Pure hypercholesterolemia, unspecified: Secondary | ICD-10-CM | POA: Diagnosis not present

## 2017-10-05 DIAGNOSIS — Z125 Encounter for screening for malignant neoplasm of prostate: Secondary | ICD-10-CM | POA: Diagnosis not present

## 2017-10-05 DIAGNOSIS — I719 Aortic aneurysm of unspecified site, without rupture: Secondary | ICD-10-CM | POA: Diagnosis not present

## 2017-10-05 DIAGNOSIS — I10 Essential (primary) hypertension: Secondary | ICD-10-CM | POA: Diagnosis not present

## 2017-10-05 DIAGNOSIS — R7301 Impaired fasting glucose: Secondary | ICD-10-CM | POA: Diagnosis not present

## 2017-10-05 DIAGNOSIS — Q231 Congenital insufficiency of aortic valve: Secondary | ICD-10-CM | POA: Diagnosis not present

## 2017-10-26 ENCOUNTER — Telehealth: Payer: Self-pay | Admitting: Cardiology

## 2017-10-26 DIAGNOSIS — Q231 Congenital insufficiency of aortic valve: Secondary | ICD-10-CM

## 2017-10-26 NOTE — Telephone Encounter (Signed)
Spoke with pt and advised we did need to get an echo because he had one in 2017 and result said to repeat in a year.  Advised I would place order and have scheduler reach out to pt.  Pt appreciative for call.

## 2017-10-26 NOTE — Telephone Encounter (Signed)
Patient calling, would like to verify if he needs to have an echo completed before next appointment 11-04-17

## 2017-10-28 ENCOUNTER — Encounter: Payer: Self-pay | Admitting: Cardiology

## 2017-11-01 ENCOUNTER — Ambulatory Visit (HOSPITAL_COMMUNITY): Payer: Medicare HMO | Attending: Cardiovascular Disease

## 2017-11-01 ENCOUNTER — Other Ambulatory Visit: Payer: Self-pay

## 2017-11-01 DIAGNOSIS — Q231 Congenital insufficiency of aortic valve: Secondary | ICD-10-CM | POA: Diagnosis not present

## 2017-11-01 DIAGNOSIS — I10 Essential (primary) hypertension: Secondary | ICD-10-CM | POA: Insufficient documentation

## 2017-11-04 ENCOUNTER — Ambulatory Visit: Payer: Medicare HMO | Admitting: Cardiology

## 2017-11-04 ENCOUNTER — Encounter: Payer: Self-pay | Admitting: Cardiology

## 2017-11-04 VITALS — BP 164/76 | HR 57 | Ht 71.0 in | Wt 208.8 lb

## 2017-11-04 DIAGNOSIS — I10 Essential (primary) hypertension: Secondary | ICD-10-CM

## 2017-11-04 DIAGNOSIS — Z01818 Encounter for other preprocedural examination: Secondary | ICD-10-CM | POA: Diagnosis not present

## 2017-11-04 DIAGNOSIS — I350 Nonrheumatic aortic (valve) stenosis: Secondary | ICD-10-CM

## 2017-11-04 LAB — BASIC METABOLIC PANEL
BUN / CREAT RATIO: 17 (ref 10–24)
BUN: 15 mg/dL (ref 8–27)
CHLORIDE: 102 mmol/L (ref 96–106)
CO2: 24 mmol/L (ref 20–29)
Calcium: 10.1 mg/dL (ref 8.6–10.2)
Creatinine, Ser: 0.88 mg/dL (ref 0.76–1.27)
GFR, EST AFRICAN AMERICAN: 99 mL/min/{1.73_m2} (ref >59)
GFR, EST NON AFRICAN AMERICAN: 86 mL/min/{1.73_m2} (ref >59)
Glucose: 128 mg/dL — ABNORMAL HIGH (ref 65–99)
POTASSIUM: 4.1 mmol/L (ref 3.5–5.2)
SODIUM: 140 mmol/L (ref 134–144)

## 2017-11-04 LAB — CBC
HEMATOCRIT: 48.3 % (ref 37.5–51.0)
Hemoglobin: 16.7 g/dL (ref 13.0–17.7)
MCH: 30 pg (ref 26.6–33.0)
MCHC: 34.6 g/dL (ref 31.5–35.7)
MCV: 87 fL (ref 79–97)
PLATELETS: 274 10*3/uL (ref 150–379)
RBC: 5.56 x10E6/uL (ref 4.14–5.80)
RDW: 13.5 % (ref 12.3–15.4)
WBC: 8.7 10*3/uL (ref 3.4–10.8)

## 2017-11-04 LAB — PROTIME-INR
INR: 1.1 (ref 0.8–1.2)
Prothrombin Time: 11.1 s (ref 9.1–12.0)

## 2017-11-04 NOTE — Patient Instructions (Signed)
Medication Instructions:  The current medical regimen is effective;  continue present plan and medications.  Labwork: Please have blood work today (BMP,CBC and PT/INR)  Testing/Procedures: Your physician has requested that you have a cardiac catheterization. Cardiac catheterization is used to diagnose and/or treat various heart conditions. Doctors may recommend this procedure for a number of different reasons. The most common reason is to evaluate chest pain. Chest pain can be a symptom of coronary artery disease (CAD), and cardiac catheterization can show whether plaque is narrowing or blocking your heart's arteries. This procedure is also used to evaluate the valves, as well as measure the blood flow and oxygen levels in different parts of your heart. For further information please visit [email or web address]. Please follow instruction sheet, as given.  You are being referred to [facility] for evaluation of Aortic Valve Replacement.  Follow-Up: Further follow up to be determined postoperatively.  If you need a refill on your cardiac medications before your next appointment, please call your pharmacy.  Thank you for choosing [facility]!!      [facility] [address], [address] [place] [address] Dept: [phone] Loc: [phone]  Rosary LivelyDavid E Kutsch  11/04/2017  You are scheduled for a cardiac cath on Tuesday, November 09, 2017 with Dr. Clifton JamesMcAlhany .  1. Please arrive at the [facility] at [facility]: [address] [place], [place] [address] at 6 AM (two hours before your procedure to ensure your preparation). Free valet parking service is available.   Special note: Every effort is made to have your procedure done on time. Please understand that emergencies sometimes delay scheduled procedures.  2. Diet: Nothing to eat or drink after midnight the night before.  3.  Labs: please have blood work today.  4. Medication instructions in preparation for your procedure:     On the morning of your procedure, take your ASA and any other medications as listed with sips of water.  5. Plan for one night stay--bring personal belongings. 6. Bring a current list of your medications and current insurance cards. 7. You MUST have a responsible person to drive you home. 8. Someone MUST be with you the first 24 hours after you arrive home or your discharge will be delayed. 9. Please wear clothes that are easy to get on and off and wear slip-on shoes.  Thank you for allowing us to care for you!   -- [facility]

## 2017-11-04 NOTE — H&P (View-Only) (Signed)
Cardiology Office Note    Date:  11/04/2017   ID:  RYANE Mullins, DOB 1946-05-11, MRN [id number]  PCP:  [name], MD  Cardiologist:   Donato Schultz, MD     History of Present Illness:  Stephen Mullins is a 72 y.o. male with bicuspid aortic valve previously seen in 2010, CT angiogram performed at that time of aorta, no coarctation. 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  Echocardiogram on 11/01/17 shows now severe aortic stenosis, bicuspid.  LVH noted as well.  Normal EF.  He has been noticing some increased shortness of breath when going upstairs, for instance that is beach house when getting up to the top after carrying the groceries, he is winded.  No chest pain, no syncope, no history of endocarditis.  He takes good care of himself.  Nonsmoker (quit in 2003).  Retired. Enjoying retired life.    Past Medical History:  Diagnosis Date  . 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 Borderline hyperlipidemia   . Elevated fasting glucose   . Elevated PSA    due to prostatitis  . Hypertension     History reviewed. No pertinent surgical history.  Outpatient Medications Prior to Visit  Medication Sig Dispense Refill  . aspirin 81 MG tablet Take 81 mg by mouth daily.    . hydrochlorothiazide (HYDRODIURIL) 12.5 MG tablet Take 12.5 mg by mouth daily.    [name] ibuprofen (ADVIL,MOTRIN) 600 MG tablet Take 600 mg by mouth every 6 (six) hours as needed for mild pain.    . metoprolol succinate (TOPROL-XL) 25 MG 24 hr tablet Take 25 mg by mouth daily.    [facility]  Achilles Tendonitis Cream- Diclofenac 3%, Baclofen 2%, Bupivacaine 1%, Doxepin 5%, Gabapentin 6%, Ibuprofen 3%, Pentoxifylline 3% Apply 1-2 grams to affected area 3-4 times daily Qty. 120 gm 3 refills    . NONFORMULARY OR COMPOUNDED ITEM [facility]:  Onychomycosis nail lacquer - Fluconazole 2%, Terbinafine 1%, DMSO,  apply daily to affected area. (Patient not taking: Reported on 11/04/2017) 120 each 2  . terbinafine (LAMISIL) 250 MG tablet Take 1 tablet (250 mg total) by mouth daily. (Patient not taking: Reported on 11/04/2017) 90 tablet 0  . terbinafine (LAMISIL) 250 MG tablet Take 1 tablet (250 mg total) by mouth daily. (Patient not taking: Reported on 11/04/2017) 30 tablet 0   No facility-administered medications prior to visit.      Allergies:   Tetanus toxoid adsorbed and Tetanus toxoid, adsorbed   Social History   Socioeconomic History  . Marital status: Married    Spouse name: None  . Number of children: None  . Years of education: None  . Highest education level: None  Social Needs  . Financial resource strain: None  . Food insecurity - worry: None  . Food insecurity - inability: None  . Transportation needs - medical: None  . Transportation needs - non-medical: None  Occupational History  . Occupation: retired  Tobacco Use  . Smoking status: Former Smoker    Last attempt to quit: 10/20/2001    Years since quitting: 16.0  . Smokeless tobacco: Never Used  Substance and Sexual Activity  . Alcohol use: Yes    Alcohol/week: 0.0 oz  . Drug use: No  . Sexual activity: None  Other Topics Concern  . None  Social History Narrative  . None  Family History:  The patient's family history includes Prostate cancer in his father.   ROS:   Please see the history of present illness.    Review of Systems  All other systems reviewed and are negative.    PHYSICAL EXAM:   VS:  BP (!) 164/76   Pulse (!) 57   Ht 5\' 11"  (1.803 m)   Wt 208 lb 12.8 oz (94.7 kg)   BMI 29.12 kg/m    GEN: Well nourished, well developed, in no acute distress  HEENT: normal  Neck: no JVD, +carotid bruits, or masses Cardiac: RRR; 3/6 SM, no  rubs, or gallops,no edema  Respiratory:  clear to auscultation bilaterally, normal work of breathing GI: soft, nontender, nondistended, + BS MS: no deformity or atrophy    Skin: warm and dry, no rash Neuro:  Alert and Oriented x 3, Strength and sensation are intact Psych: euthymic mood, full affect  Wt Readings from Last 3 Encounters:  11/04/17 208 lb 12.8 oz (94.7 kg)  10/25/15 209 lb 12.8 oz (95.2 kg)      Studies/Labs Reviewed:   EKG:  EKG is ordered today.  11/04/17-sinus bradycardia rate 57, LVH with repolarization abnormality personally reviewed-prior 10/25/15-sinus bradycardia rate 51 with nonspecific ST-T wave changes, LVH.  Recent Labs: 04/27/2017: ALT 22; Hemoglobin 15.4; Platelets 258   Lipid Panel No results found for: CHOL, TRIG, HDL, CHOLHDL, VLDL, LDLCALC, LDLDIRECT   Creatinine 0.9, ALT 28, LDL 144, HDL 40, hemoglobin A1c 6.1  Additional studies/ records that were reviewed today include:  Prior ecg, labs, office note reviewed  ECHO: 11/01/17: - Left ventricle: The cavity size was normal. Wall thickness was   increased in a pattern of severe LVH. Systolic function was   normal. The estimated ejection fraction was in the range of 60%   to 65%. Wall motion was normal; there were no regional wall   motion abnormalities. Features are consistent with a pseudonormal   left ventricular filling pattern, with concomitant abnormal   relaxation and increased filling pressure (grade 2 diastolic   dysfunction). - Aortic valve: Mildly calcified annulus. Bicuspid; moderately   thickened, moderately calcified leaflets. There was severe   stenosis. There was moderate to severe regurgitation. Valve area   (VTI): 0.88 cm^2. Valve area (Vmax): 0.81 cm^2. Valve area   (Vmean): 0.73 cm^2. - Left atrium: The atrium was mildly dilated. - Pulmonary arteries: Systolic pressure was mildly increased. PA   peak pressure: 32 mm Hg (S).   ASSESSMENT:    1. Aortic valve stenosis, etiology of cardiac valve disease unspecified   2. Pre-operative examination   3. Essential (primary) hypertension      PLAN:  In order of problems listed  above:  1. Bicuspid aortic valve- now demonstrates severe aortic stenosis.  Has symptoms of shortness of breath.  Previously moderate aortic stenosis was noted. Prior CT scan of aorta showed no evidence of coarctation.  I will go ahead and refer him to [facility] for evaluation of aortic valve replacement.  We will go ahead and set him up for cardiac catheterization preoperatively .  Risks and benefits including stroke, heart attack, death, renal impairment, bleeding have been discussed.  He is willing to proceed.  I do not feel strongly that he requires a right heart catheterization. 2. Essential hypertension-continue current medications.  Mildly elevated today, previously was under good control.  Does have signs of LVH on echocardiogram.  No changes made today. 3. 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 4. Hyperlipidemia-go ahead and continue with diet and exercise.  Follow-up to be determined postop  Medication Adjustments/Labs and Tests Ordered: Current medicines are reviewed at length with the patient today.  Concerns regarding medicines are outlined above.  Medication changes, Labs and Tests ordered today are listed in the Patient Instructions below. There are no Patient Instructions on file for this visit.     Signed, Donato Schultz, MD  11/04/2017 10:06 AM    [facility] [address] [place], [place], [place]  [address] Phone: [phone]; Fax: [phone]

## 2017-11-04 NOTE — Progress Notes (Signed)
Cardiology Office Note    Date:  11/04/2017   ID:  Stephen Mullins, DOB 1946-05-11, MRN [id number]  PCP:  [name], MD  Cardiologist:   Donato Schultz, MD     History of Present Illness:  Stephen Mullins is a 72 y.o. male with bicuspid aortic valve previously seen in 2010, CT angiogram performed at that time of aorta, no coarctation. 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  Echocardiogram on 11/01/17 shows now severe aortic stenosis, bicuspid.  LVH noted as well.  Normal EF.  He has been noticing some increased shortness of breath when going upstairs, for instance that [place] when getting up to the top after carrying the groceries, he is winded.  No chest pain, no syncope, no history of endocarditis.  He takes good care of himself.  Nonsmoker (quit in 2003).  Retired. Enjoying retired life.    Past Medical History:  Diagnosis Date  . 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 Borderline hyperlipidemia   . Elevated fasting glucose   . Elevated PSA    due to prostatitis  . Hypertension     History reviewed. No pertinent surgical history.  Outpatient Medications Prior to Visit  Medication Sig Dispense Refill  . aspirin 81 MG tablet Take 81 mg by mouth daily.    . hydrochlorothiazide (HYDRODIURIL) 12.5 MG tablet Take 12.5 mg by mouth daily.    [name] ibuprofen (ADVIL,MOTRIN) 600 MG tablet Take 600 mg by mouth every 6 (six) hours as needed for mild pain.    . metoprolol succinate (TOPROL-XL) 25 MG 24 hr tablet Take 25 mg by mouth daily.    [facility]  Achilles Tendonitis Cream- Diclofenac 3%, Baclofen 2%, Bupivacaine 1%, Doxepin 5%, Gabapentin 6%, Ibuprofen 3%, Pentoxifylline 3% Apply 1-2 grams to affected area 3-4 times daily Qty. 120 gm 3 refills    . NONFORMULARY OR COMPOUNDED ITEM [facility]:  Onychomycosis nail lacquer - Fluconazole 2%, Terbinafine 1%, DMSO,  apply daily to affected area. (Patient not taking: Reported on 11/04/2017) 120 each 2  . terbinafine (LAMISIL) 250 MG tablet Take 1 tablet (250 mg total) by mouth daily. (Patient not taking: Reported on 11/04/2017) 90 tablet 0  . terbinafine (LAMISIL) 250 MG tablet Take 1 tablet (250 mg total) by mouth daily. (Patient not taking: Reported on 11/04/2017) 30 tablet 0   No facility-administered medications prior to visit.      Allergies:   Tetanus toxoid adsorbed and Tetanus toxoid, adsorbed   Social History   Socioeconomic History  . Marital status: Married    Spouse name: None  . Number of children: None  . Years of education: None  . Highest education level: None  Social Needs  . Financial resource strain: None  . Food insecurity - worry: None  . Food insecurity - inability: None  . Transportation needs - medical: None  . Transportation needs - non-medical: None  Occupational History  . Occupation: retired  Tobacco Use  . Smoking status: Former Smoker    Last attempt to quit: 10/20/2001    Years since quitting: 16.0  . Smokeless tobacco: Never Used  Substance and Sexual Activity  . Alcohol use: Yes    Alcohol/week: 0.0 oz  . Drug use: No  . Sexual activity: None  Other Topics Concern  . None  Social History Narrative  . None  Family History:  The patient's family history includes Prostate cancer in his father.   ROS:   Please see the history of present illness.    Review of Systems  All other systems reviewed and are negative.    PHYSICAL EXAM:   VS:  BP (!) 164/76   Pulse (!) 57   Ht 5\' 11"  (1.803 m)   Wt 208 lb 12.8 oz (94.7 kg)   BMI 29.12 kg/m    GEN: Well nourished, well developed, in no acute distress  HEENT: normal  Neck: no JVD, +carotid bruits, or masses Cardiac: RRR; 3/6 SM, no  rubs, or gallops,no edema  Respiratory:  clear to auscultation bilaterally, normal work of breathing GI: soft, nontender, nondistended, + BS MS: no deformity or atrophy    Skin: warm and dry, no rash Neuro:  Alert and Oriented x 3, Strength and sensation are intact Psych: euthymic mood, full affect  Wt Readings from Last 3 Encounters:  11/04/17 208 lb 12.8 oz (94.7 kg)  10/25/15 209 lb 12.8 oz (95.2 kg)      Studies/Labs Reviewed:   EKG:  EKG is ordered today.  11/04/17-sinus bradycardia rate 57, LVH with repolarization abnormality personally reviewed-prior 10/25/15-sinus bradycardia rate 51 with nonspecific ST-T wave changes, LVH.  Recent Labs: 04/27/2017: ALT 22; Hemoglobin 15.4; Platelets 258   Lipid Panel No results found for: CHOL, TRIG, HDL, CHOLHDL, VLDL, LDLCALC, LDLDIRECT   Creatinine 0.9, ALT 28, LDL 144, HDL 40, hemoglobin A1c 6.1  Additional studies/ records that were reviewed today include:  Prior ecg, labs, office note reviewed  ECHO: 11/01/17: - Left ventricle: The cavity size was normal. Wall thickness was   increased in a pattern of severe LVH. Systolic function was   normal. The estimated ejection fraction was in the range of 60%   to 65%. Wall motion was normal; there were no regional wall   motion abnormalities. Features are consistent with a pseudonormal   left ventricular filling pattern, with concomitant abnormal   relaxation and increased filling pressure (grade 2 diastolic   dysfunction). - Aortic valve: Mildly calcified annulus. Bicuspid; moderately   thickened, moderately calcified leaflets. There was severe   stenosis. There was moderate to severe regurgitation. Valve area   (VTI): 0.88 cm^2. Valve area (Vmax): 0.81 cm^2. Valve area   (Vmean): 0.73 cm^2. - Left atrium: The atrium was mildly dilated. - Pulmonary arteries: Systolic pressure was mildly increased. PA   peak pressure: 32 mm Hg (S).   ASSESSMENT:    1. Aortic valve stenosis, etiology of cardiac valve disease unspecified   2. Pre-operative examination   3. Essential (primary) hypertension      PLAN:  In order of problems listed  above:  1. Bicuspid aortic valve- now demonstrates severe aortic stenosis.  Has symptoms of shortness of breath.  Previously moderate aortic stenosis was noted. Prior CT scan of aorta showed no evidence of coarctation.  I will go ahead and refer him to [facility] for evaluation of aortic valve replacement.  We will go ahead and set him up for cardiac catheterization preoperatively .  Risks and benefits including stroke, heart attack, death, renal impairment, bleeding have been discussed.  He is willing to proceed.  I do not feel strongly that he requires a right heart catheterization. 2. Essential hypertension-continue current medications.  Mildly elevated today, previously was under good control.  Does have signs of LVH on echocardiogram.  No changes made today. 3. 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 4. Hyperlipidemia-go ahead and continue with diet and exercise.  Follow-up to be determined postop  Medication Adjustments/Labs and Tests Ordered: Current medicines are reviewed at length with the patient today.  Concerns regarding medicines are outlined above.  Medication changes, Labs and Tests ordered today are listed in the Patient Instructions below. There are no Patient Instructions on file for this visit.     Signed, Donato Schultz, MD  11/04/2017 10:06 AM    [facility] [address] [place], [place], [place]  [address] Phone: [phone]; Fax: [phone]

## 2017-11-08 ENCOUNTER — Telehealth: Payer: Self-pay | Admitting: *Deleted

## 2017-11-08 NOTE — Telephone Encounter (Signed)
Pt contacted pre-catheterization scheduled at [facility] for: Tuesday November 09, 2017 at 8 AM Verified arrival time and place: [facility] at: 6 AM Nothing to eat or drink after midnight prior to cath.  Hold:  HCTZ AM of cath  Except hold medications AM meds can be  taken pre-cath with sip of water including: ASA 81 mg   Confirmed patient has responsible person to drive home post procedure and observe patient for 24 hours: yes

## 2017-11-09 ENCOUNTER — Encounter (HOSPITAL_COMMUNITY)
Admission: RE | Disposition: A | Discharge status: Home / Self Care | Payer: Self-pay | Source: Ambulatory Visit | Attending: Cardiovascular Disease

## 2017-11-09 ENCOUNTER — Ambulatory Visit (HOSPITAL_COMMUNITY)
Admission: RE | Admit: 2017-11-09 | Discharge: 2017-11-09 | Disposition: A | Discharge status: Home / Self Care | Payer: Medicare HMO | Source: Ambulatory Visit | Attending: Cardiovascular Disease | Admitting: Cardiovascular Disease

## 2017-11-09 ENCOUNTER — Encounter (HOSPITAL_COMMUNITY): Payer: Self-pay | Admitting: Cardiovascular Disease

## 2017-11-09 DIAGNOSIS — I2510 Atherosclerotic heart disease of native coronary artery without angina pectoris: Secondary | ICD-10-CM | POA: Insufficient documentation

## 2017-11-09 DIAGNOSIS — I10 Essential (primary) hypertension: Secondary | ICD-10-CM | POA: Insufficient documentation

## 2017-11-09 DIAGNOSIS — I350 Nonrheumatic aortic (valve) stenosis: Secondary | ICD-10-CM | POA: Insufficient documentation

## 2017-11-09 DIAGNOSIS — Z7982 Long term (current) use of aspirin: Secondary | ICD-10-CM | POA: Diagnosis not present

## 2017-11-09 DIAGNOSIS — Z87891 Personal history of nicotine dependence: Secondary | ICD-10-CM | POA: Insufficient documentation

## 2017-11-09 HISTORY — PX: RIGHT/LEFT HEART CATH AND CORONARY ANGIOGRAPHY: CATH118266

## 2017-11-09 LAB — POCT I-STAT 3, ART BLOOD GAS (G3+)
Bicarbonate: 24.7 mmol/L (ref 20.0–28.0)
O2 Saturation: 93 %
TCO2: 26 mmol/L (ref 22–32)
pCO2 arterial: 40.2 mmHg (ref 32.0–48.0)
pH, Arterial: 7.396 (ref 7.350–7.450)
pO2, Arterial: 67 mmHg — ABNORMAL LOW (ref 83.0–108.0)

## 2017-11-09 LAB — POCT I-STAT 3, VENOUS BLOOD GAS (G3P V)
Acid-Base Excess: 1 mmol/L (ref 0.0–2.0)
Bicarbonate: 26.3 mmol/L (ref 20.0–28.0)
O2 Saturation: 66 %
TCO2: 28 mmol/L (ref 22–32)
pCO2, Ven: 45.2 mmHg (ref 44.0–60.0)
pH, Ven: 7.373 (ref 7.250–7.430)
pO2, Ven: 36 mmHg (ref 32.0–45.0)

## 2017-11-09 LAB — POCT ACTIVATED CLOTTING TIME: Activated Clotting Time: 186 s

## 2017-11-09 SURGERY — RIGHT/LEFT HEART CATH AND CORONARY ANGIOGRAPHY
Anesthesia: LOCAL

## 2017-11-09 MED ORDER — HEPARIN (PORCINE) IN NACL 2-0.9 UNIT/ML-% IJ SOLN
INTRAMUSCULAR | Status: AC | PRN
  Administered 2017-11-09: 1000 mL

## 2017-11-09 MED ORDER — IOPAMIDOL (ISOVUE-370) INJECTION 76%
INTRAVENOUS | Status: DC | PRN
  Administered 2017-11-09: 85 mL via INTRA_ARTERIAL

## 2017-11-09 MED ORDER — MIDAZOLAM HCL 2 MG/2ML IJ SOLN
INTRAMUSCULAR | Status: AC
  Filled 2017-11-09: qty 2

## 2017-11-09 MED ORDER — SODIUM CHLORIDE 0.9 % WEIGHT BASED INFUSION
3.0000 mL/kg/h | INTRAVENOUS | Status: AC
  Administered 2017-11-09: 3 mL/kg/h via INTRAVENOUS

## 2017-11-09 MED ORDER — SODIUM CHLORIDE 0.9 % WEIGHT BASED INFUSION: 1.0000 mL/kg/h | INTRAVENOUS | Status: DC

## 2017-11-09 MED ORDER — HEPARIN SODIUM (PORCINE) 1000 UNIT/ML IJ SOLN
INTRAMUSCULAR | Status: DC | PRN
  Administered 2017-11-09: 4500 [IU] via INTRAVENOUS

## 2017-11-09 MED ORDER — LIDOCAINE HCL 1 % IJ SOLN
INTRAMUSCULAR | Status: AC
  Filled 2017-11-09: qty 20

## 2017-11-09 MED ORDER — IOPAMIDOL (ISOVUE-370) INJECTION 76%
INTRAVENOUS | Status: AC
  Filled 2017-11-09: qty 100

## 2017-11-09 MED ORDER — SODIUM CHLORIDE 0.9% FLUSH: 3.0000 mL | INTRAVENOUS | Status: DC | PRN

## 2017-11-09 MED ORDER — MIDAZOLAM HCL 2 MG/2ML IJ SOLN
INTRAMUSCULAR | Status: DC | PRN
  Administered 2017-11-09: 1 mg via INTRAVENOUS
  Administered 2017-11-09: 2 mg via INTRAVENOUS

## 2017-11-09 MED ORDER — VERAPAMIL HCL 2.5 MG/ML IV SOLN
INTRAVENOUS | Status: DC | PRN
  Administered 2017-11-09: 10 mL via INTRA_ARTERIAL

## 2017-11-09 MED ORDER — LIDOCAINE HCL (PF) 1 % IJ SOLN
INTRAMUSCULAR | Status: DC | PRN
  Administered 2017-11-09 (×2): 2 mL via SUBCUTANEOUS
  Administered 2017-11-09: 15 mL via SUBCUTANEOUS

## 2017-11-09 MED ORDER — SODIUM CHLORIDE 0.9 % IV SOLN: 250.0000 mL | INTRAVENOUS | Status: DC | PRN

## 2017-11-09 MED ORDER — SODIUM CHLORIDE 0.9% FLUSH: 3.0000 mL | Freq: Two times a day (BID) | INTRAVENOUS | Status: DC

## 2017-11-09 MED ORDER — FENTANYL CITRATE (PF) 100 MCG/2ML IJ SOLN
INTRAMUSCULAR | Status: DC | PRN
  Administered 2017-11-09 (×2): 25 ug via INTRAVENOUS

## 2017-11-09 MED ORDER — ASPIRIN 81 MG PO CHEW: 81.0000 mg | CHEWABLE_TABLET | ORAL | Status: DC

## 2017-11-09 MED ORDER — VERAPAMIL HCL 2.5 MG/ML IV SOLN
INTRAVENOUS | Status: AC
  Filled 2017-11-09: qty 2

## 2017-11-09 MED ORDER — SODIUM CHLORIDE 0.9 % IV SOLN: INTRAVENOUS | Status: AC

## 2017-11-09 MED ORDER — HEPARIN SODIUM (PORCINE) 1000 UNIT/ML IJ SOLN
INTRAMUSCULAR | Status: AC
  Filled 2017-11-09: qty 1

## 2017-11-09 MED ORDER — FENTANYL CITRATE (PF) 100 MCG/2ML IJ SOLN
INTRAMUSCULAR | Status: AC
  Filled 2017-11-09: qty 2

## 2017-11-09 SURGICAL SUPPLY — 18 items
CATH BALLN WEDGE 5F 110CM (CATHETERS) ×2 IMPLANT
CATH IMPULSE 5F ANG/FL3.5 (CATHETERS) ×2 IMPLANT
CATH INFINITI 5 FR 3DRC (CATHETERS) ×2 IMPLANT
CATH SWAN GANZ 7F STRAIGHT (CATHETERS) ×2 IMPLANT
COVER PRB 48X5XTLSCP FOLD TPE (BAG) ×1 IMPLANT
COVER PROBE 5X48 (BAG) ×1
DEVICE RAD COMP TR BAND LRG (VASCULAR PRODUCTS) ×2 IMPLANT
GLIDESHEATH SLEND SS 6F .021 (SHEATH) ×2 IMPLANT
GUIDEWIRE .025 260CM (WIRE) ×2 IMPLANT
GUIDEWIRE INQWIRE 1.5J.035X260 (WIRE) ×1 IMPLANT
INQWIRE 1.5J .035X260CM (WIRE) ×2
KIT HEART LEFT (KITS) ×2 IMPLANT
KIT MICROPUNCTURE NIT STIFF (SHEATH) ×2 IMPLANT
PACK CARDIAC CATHETERIZATION (CUSTOM PROCEDURE TRAY) ×2 IMPLANT
SHEATH GLIDE SLENDER 4/5FR (SHEATH) ×2 IMPLANT
SHEATH PINNACLE 7F 10CM (SHEATH) ×2 IMPLANT
TRANSDUCER W/STOPCOCK (MISCELLANEOUS) ×2 IMPLANT
TUBING CIL FLEX 10 FLL-RA (TUBING) ×2 IMPLANT

## 2017-11-09 NOTE — Interval H&P Note (Signed)
History and Physical Interval Note:  11/09/2017 8:39 AM  Stephen Mullins  has presented today for cardiac cath with the diagnosis of severe aortic stenosis  The various methods of treatment have been discussed with the patient and family. After consideration of risks, benefits and other options for treatment, the patient has consented to  Procedure(s): RIGHT/LEFT HEART CATH AND CORONARY ANGIOGRAPHY (N/A) as a surgical intervention .  The patient's history has been reviewed, patient examined, no change in status, stable for surgery.  I have reviewed the patient's chart and labs.  Questions were answered to the patient's satisfaction.    Cath Lab Visit (complete for each Cath Lab visit)  Clinical Evaluation Leading to the Procedure:   ACS: No.  Non-ACS:    Anginal Classification: No Symptoms  Anti-ischemic medical therapy: Minimal Therapy (1 class of medications)  Non-Invasive Test Results: No non-invasive testing performed  Prior CABG: No previous CABG         Verne Carrowhristopher Chuck Caban

## 2017-11-09 NOTE — CV Procedure (Signed)
Site area: Right femoral vein  Site Prior to Removal: level 0 Pressure Applied For: 10 mins Manual: yes Patient Status During Pull:  stable Post Pull Site: level 0 Post Pull Instructions Given: Yes  Post Pull Pulses Present: DP 2+ Dressing Applied: gauze and tegaderm Bedrest begins @ 1035 Comments: Bedrest for 3 hours

## 2017-11-09 NOTE — Discharge Instructions (Signed)
Radial Site Care °Refer to this sheet in the next few weeks. These instructions provide you with information about caring for yourself after your procedure. Your health care provider may also give you more specific instructions. Your treatment has been planned according to current medical practices, but problems sometimes occur. Call your health care provider if you have any problems or questions after your procedure. °What can I expect after the procedure? °After your procedure, it is typical to have the following: °· Bruising at the radial site that usually fades within 1-2 weeks. °· Blood collecting in the tissue (hematoma) that may be painful to the touch. It should usually decrease in size and tenderness within 1-2 weeks. ° °Follow these instructions at home: °· Take medicines only as directed by your health care provider. °· You may shower 24-48 hours after the procedure or as directed by your health care provider. Remove the bandage (dressing) and gently wash the site with plain soap and water. Pat the area dry with a clean towel. Do not rub the site, because this may cause bleeding. °· Do not take baths, swim, or use a hot tub until your health care provider approves. °· Check your insertion site every day for redness, swelling, or drainage. °· Do not apply powder or lotion to the site. °· Do not flex or bend the affected arm for 24 hours or as directed by your health care provider. °· Do not push or pull heavy objects with the affected arm for 24 hours or as directed by your health care provider. °· Do not lift over 10 lb (4.5 kg) for 5 days after your procedure or as directed by your health care provider. °· Ask your health care provider when it is okay to: °? Return to work or school. °? Resume usual physical activities or sports. °? Resume sexual activity. °· Do not drive home if you are discharged the same day as the procedure. Have someone else drive you. °· You may drive 24 hours after the procedure  unless otherwise instructed by your health care provider. °· Do not operate machinery or power tools for 24 hours after the procedure. °· If your procedure was done as an outpatient procedure, which means that you went home the same day as your procedure, a responsible adult should be with you for the first 24 hours after you arrive home. °· Keep all follow-up visits as directed by your health care provider. This is important. °Contact a health care provider if: °· You have a fever. °· You have chills. °· You have increased bleeding from the radial site. Hold pressure on the site. °Get help right away if: °· You have unusual pain at the radial site. °· You have redness, warmth, or swelling at the radial site. °· You have drainage (other than a small amount of blood on the dressing) from the radial site. °· The radial site is bleeding, and the bleeding does not stop after 30 minutes of holding steady pressure on the site. °· Your arm or hand becomes pale, cool, tingly, or numb. °This information is not intended to replace advice given to you by your health care provider. Make sure you discuss any questions you have with your health care provider. °Document Released: 09/26/2010 Document Revised: 01/30/2016 Document Reviewed: 03/12/2014 °Elsevier Interactive Patient Education © 2018 [employer] Inc[employer] °Femoral Site Care °Refer to this sheet in the next few weeks. These instructions provide you with information about caring for yourself after your procedure. Your   health care provider may also give you more specific instructions. Your treatment has been planned according to current medical practices, but problems sometimes occur. Call your health care provider if you have any problems or questions after your procedure. °What can I expect after the procedure? °After your procedure, it is typical to have the following: °· Bruising at the site that usually fades within 1-2 weeks. °· Blood collecting in the tissue (hematoma) that  may be painful to the touch. It should usually decrease in size and tenderness within 1-2 weeks. ° °Follow these instructions at home: °· Take medicines only as directed by your health care provider. °· You may shower 24-48 hours after the procedure or as directed by your health care provider. Remove the bandage (dressing) and gently wash the site with plain soap and water. Pat the area dry with a clean towel. Do not rub the site, because this may cause bleeding. °· Do not take baths, swim, or use a hot tub until your health care provider approves. °· Check your insertion site every day for redness, swelling, or drainage. °· Do not apply powder or lotion to the site. °· Limit use of stairs to twice a day for the first 2-3 days or as directed by your health care provider. °· Do not squat for the first 2-3 days or as directed by your health care provider. °· Do not lift over 10 lb (4.5 kg) for 5 days after your procedure or as directed by your health care provider. °· Ask your health care provider when it is okay to: °? Return to work or school. °? Resume usual physical activities or sports. °? Resume sexual activity. °· Do not drive home if you are discharged the same day as the procedure. Have someone else drive you. °· You may drive 24 hours after the procedure unless otherwise instructed by your health care provider. °· Do not operate machinery or power tools for 24 hours after the procedure or as directed by your health care provider. °· If your procedure was done as an outpatient procedure, which means that you went home the same day as your procedure, a responsible adult should be with you for the first 24 hours after you arrive home. °· Keep all follow-up visits as directed by your health care provider. This is important. °Contact a health care provider if: °· You have a fever. °· You have chills. °· You have increased bleeding from the site. Hold pressure on the site. °Get help right away if: °· You have  unusual pain at the site. °· You have redness, warmth, or swelling at the site. °· You have drainage (other than a small amount of blood on the dressing) from the site. °· The site is bleeding, and the bleeding does not stop after 30 minutes of holding steady pressure on the site. °· Your leg or foot becomes pale, cool, tingly, or numb. °This information is not intended to replace advice given to you by your health care provider. Make sure you discuss any questions you have with your health care provider. °Document Released: 04/27/2014 Document Revised: 01/30/2016 Document Reviewed: 03/13/2014 °Elsevier Interactive Patient Education © 2018 Elsevier Inc. ° °

## 2017-11-24 ENCOUNTER — Other Ambulatory Visit: Payer: Self-pay | Admitting: *Deleted

## 2017-11-24 ENCOUNTER — Encounter: Payer: Self-pay | Admitting: Cardiothoracic Surgery

## 2017-11-24 ENCOUNTER — Institutional Professional Consult (permissible substitution): Payer: Medicare HMO | Admitting: Cardiothoracic Surgery

## 2017-11-24 VITALS — BP 150/66 | HR 61 | Resp 20 | Ht 71.0 in | Wt 202.0 lb

## 2017-11-24 DIAGNOSIS — Q231 Congenital insufficiency of aortic valve: Secondary | ICD-10-CM | POA: Diagnosis not present

## 2017-11-24 DIAGNOSIS — I350 Nonrheumatic aortic (valve) stenosis: Secondary | ICD-10-CM

## 2017-11-24 DIAGNOSIS — Q2381 Bicuspid aortic valve: Secondary | ICD-10-CM

## 2017-11-24 NOTE — Progress Notes (Signed)
PCP is [name], MD Referring Provider is Jake Bathe, MD  Chief Complaint  Patient presents with  . Aortic Stenosis    Surgical eval, Cardiac Cath 11/09/17, ECHO 11/01/17   Patient examined, images of cardiac cath angiograms and echocardiogram personally reviewed and discussed with patient and wife   HPI: 72 year old retired male ex-smoker with long history of cardiac murmur.  The patient has been followed by Dr. Anne Fu for aortic stenosis which by echo has been moderate in severity.  However on the most recent echo his gradient indicated severe aortic stenosis with a valve area of 0.7, peak gradient 75 mmHg, mean gradient 42 mmHg.  There is also moderate-severe aortic insufficiency.  Patient had severe LVH.  Although the patient today denies any symptoms of chest pain shortness of breath on exertion or presyncope, in the past he has related symptoms of shortness of breath while [place] items [place].  The patient does aerobic activity at the fitness center at least 3 days a week and also plays some doubles racquetball with his wife.  No family history of heart valve surgery or aortic stenosis.  No history of rheumatic fever or scarlet fever as a child.  No recent dental complaints.  He gets his teeth cleaned every 6 months. He is recently retired from [employer].  The patient had normal coronaries on coronary angiogram.  Right heart cath pressures were normal cardiac index was 2.1 Past Medical History:  Diagnosis Date  . 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 Borderline hyperlipidemia   . Elevated fasting glucose   . Elevated PSA    due to prostatitis  . Hypertension     Past Surgical History:  Procedure Laterality Date  . RIGHT/LEFT HEART CATH AND CORONARY ANGIOGRAPHY N/A 11/09/2017   Procedure: RIGHT/LEFT HEART CATH AND CORONARY ANGIOGRAPHY;  Surgeon:  [name], MD;  Location: [facility] INVASIVE CV LAB;  Service: Cardiovascular;  Laterality: N/A;    Family History  Problem Relation Age of Onset  . Prostate cancer Father     Social History Social History   Tobacco Use  . Smoking status: Former Smoker    Last attempt to quit: 10/20/2001    Years since quitting: 16.1  . Smokeless tobacco: Never Used  Substance Use Topics  . Alcohol use: Yes    Alcohol/week: 0.0 oz  . Drug use: No    Current Outpatient Medications  Medication Sig Dispense Refill  . aspirin 81 MG tablet Take 81 mg by mouth daily.    . hydrochlorothiazide (HYDRODIURIL) 12.5 MG tablet Take 12.5 mg by mouth daily.    [name] ibuprofen (ADVIL,MOTRIN) 600 MG tablet Take 600 mg by mouth every 6 (six) hours as needed for headache or mild pain.     . metoprolol succinate (TOPROL-XL) 25 MG 24 hr tablet Take 25 mg by mouth daily.     No current facility-administered medications for this visit.     Allergies  Allergen Reactions  . Tetanus Toxoid Adsorbed Other (See Comments)    TURNED BLUE AGE 69  . Tetanus Toxoid, Adsorbed Other (See Comments)    Pt. States as small child he had tetnus made from horse serum and he turned 'blue'    Review of Systems        Review of Systems :  [ y ] = yes, [  ] = no  General :  Weight gain [   ]    Weight loss  [   ]  Fatigue [  ]  Fever [  ]  Chills  [  ]                                Weakness  [  ]           HEENT    Headache [  ]  Dizziness [  ]  Blurred vision [  ] Glaucoma  [  ]                          Nosebleeds [  ] Painful or loose teeth [  ]        Cardiac :  Chest pain/ pressure [  ]  Resting SOB [  ] exertional SOB [yes]                        Orthopnea [  ]  Pedal edema  [  ]  Palpitations [  ] Syncope/presyncope [ ]                         Paroxysmal nocturnal dyspnea [  ]         Pulmonary : cough [  ]  wheezing [  ]  Hemoptysis [  ] Sputum [  ] Snoring [  ]                              Pneumothorax [  ]   Sleep apnea [  ]        GI : Vomiting [  ]  Dysphagia [  ]  Melena  [  ]  Abdominal pain [  ] BRBPR [  ]              Heart burn [  ]  Constipation [  ] Diarrhea  [  ] Colonoscopy [   ]        GU : Hematuria [  ]  Dysuria [  ]  Nocturia [  ] UTI's [  ]        Vascular : Claudication [  ]  Rest pain [  ]  DVT [  ] Vein stripping [  ] leg ulcers [  ]                          TIA [  ] Stroke [  ]  Varicose veins [yes on left leg]        NEURO :  Headaches  [  ] Seizures [  ] Vision changes [  ] Paresthesias [  ]                                       Seizures [  ]        Musculoskeletal :  Arthritis [  ] Gout  [  ]  Back pain [  ]  Joint pain [  ]        Skin :  Rash [  ]  Melanoma [  ] Sores [  ]  Heme : Bleeding problems [  ]Clotting Disorders [  ] Anemia [  ]Blood Transfusion [ ]         Endocrine : Diabetes [  ] Heat or Cold intolerance [  ] Polyuria [  ]excessive thirst [ ]         Psych : Depression [  ]  Anxiety [  ]  Psych hospitalizations [  ] Memory change [  ]        Right-hand-dominant      Has never been hospitalized                                         BP (!) 150/66   Pulse 61   Resp 20   Ht 5\' 11"  (1.803 m)   Wt 202 lb (91.6 kg)   SpO2 97%   BMI 28.17 kg/m  Physical Exam     Physical Exam  General: Well-developed well-nourished 72 year old male no acute distress accompanied by his wife HEENT: Normocephalic pupils equal , dentition adequate Neck: Supple without JVD, adenopathy, or bruit Chest: Clear to auscultation, symmetrical breath sounds, no rhonchi, no tenderness             or deformity Cardiovascular: Regular rate and rhythm, n4/6 systolic ejection murmur of ASr, no gallop, peripheral pulses             palpable in all extremities Abdomen:  Soft, nontender, no palpable mass or organomegaly Extremities: Warm, well-perfused, no clubbing cyanosis edema or tenderness,              no venous stasis changes of the legs-superficial varicosities of  left lower leg Rectal/GU: Deferred Neuro: Grossly non--focal and symmetrical throughout Skin: Clean and dry without rash or ulceration   Diagnostic Tests: Echo images coronary arteriogram images all personally reviewed and reported above  Impression: Severe aortic stenosis with LVH  bicuspid aortic valve Evidence of some ascending aortic dilatation by echo-CT scan pending   Plan patient would benefit from aortic valve replacement with a biologic AVR for improved survival, preservation of LV function and to prevent heart failure.  I discussed the procedure of AVR in detail with the patient is wife including indications benefits details of surgery, and risks. He indicates his understanding and agrees to proceed with surgery which [facility].  Mikey BussingPeter Van Trigt III, MD [facility] [phone]

## 2017-11-25 ENCOUNTER — Other Ambulatory Visit: Payer: Self-pay | Admitting: *Deleted

## 2017-11-25 DIAGNOSIS — I351 Nonrheumatic aortic (valve) insufficiency: Secondary | ICD-10-CM

## 2017-11-25 DIAGNOSIS — Z01818 Encounter for other preprocedural examination: Secondary | ICD-10-CM

## 2017-11-25 DIAGNOSIS — I350 Nonrheumatic aortic (valve) stenosis: Secondary | ICD-10-CM

## 2017-12-15 NOTE — Pre-Procedure Instructions (Signed)
Stephen LivelyDavid E Mullins  12/15/2017      [facility] - [place], [place] - [address] [place] [id number] Phone: [phone] Fax: [phone]    Your procedure is scheduled on 12/20/2017.  [facility] at 0530 A.M.  Call this number if you have problems the morning of surgery:  [phone]   Remember:  Do not eat food or drink liquids after midnight.   Continue all medications as directed by your physician except follow these medication instructions before surgery below   Take these medicines the morning of surgery with A SIP OF WATER: Metoprolol succinate (Toprol-XL)  7 days prior to surgery STOP taking any NSAIDs - Aleve, Naproxen, Ibuprofen, Motrin, Advil, Goody's, BC's; all herbal medications, fish oil, and all vitamins.  Follow your doctors instructions regarding your Aspirin.  If no instructions were given by your doctor, then you will need to call the prescribing office office to get instructions.       Do not wear jewelry.  Do not wear lotions, powders, or colognes, or deodorant.  Men may shave face and neck.  Do not bring valuables to the hospital.  [facility] is not responsible for any belongings or valuables.  Hearing Aids, eyeglasses, contacts, dentures or bridgework may not be worn into surgery.  Leave your suitcase in the car.  After surgery it may be brought to your room.  For patients admitted to the hospital, discharge time will be determined by your treatment team.  Patients discharged the day of surgery will not be allowed to drive home.   Name and phone number of your driver:    Special instructions:   [facility]- Preparing For Surgery  Before surgery, you can play an important role. Because skin is not sterile, your skin needs to be as free of germs as possible. You can reduce the number of germs on your skin by washing with CHG (chlorahexidine gluconate) Soap before surgery.   CHG is an antiseptic cleaner which kills germs and bonds with the skin to continue killing germs even after washing.  Please do not use if you have an allergy to CHG or antibacterial soaps. If your skin becomes reddened/irritated stop using the CHG.  Do not shave (including legs and underarms) for at least 48 hours prior to first CHG shower. It is OK to shave your face.  Please follow these instructions carefully.   1. Shower the NIGHT BEFORE SURGERY and the MORNING OF SURGERY with CHG.   2. If you chose to wash your hair, wash your hair first as usual with your normal shampoo.  3. After you shampoo, rinse your hair and body thoroughly to remove the shampoo.  4. Use CHG as you would any other liquid soap. You can apply CHG directly to the skin and wash gently with a scrungie or a clean washcloth.   5. Apply the CHG Soap to your body ONLY FROM THE NECK DOWN.  Do not use on open wounds or open sores. Avoid contact with your eyes, ears, mouth and genitals (private parts). Wash Face and genitals (private parts)  with your normal soap.  6. Wash thoroughly, paying special attention to the area where your surgery will be performed.  7. Thoroughly rinse your body with warm water from the neck down.  8. DO NOT shower/wash with your normal soap after using and rinsing off the CHG Soap.  9. Pat yourself dry with a  CLEAN TOWEL.  10. Wear CLEAN PAJAMAS to bed the night before surgery, wear comfortable clothes the morning of surgery  11. Place CLEAN SHEETS on your bed the night of your first shower and DO NOT SLEEP WITH PETS.    Day of Surgery: Shower as stated above. Do not apply any deodorants/lotions.  Please wear clean clothes to the hospital/surgery center.      Please read over the following fact sheets that you were given.

## 2017-12-16 ENCOUNTER — Other Ambulatory Visit: Payer: Self-pay

## 2017-12-16 ENCOUNTER — Encounter (HOSPITAL_COMMUNITY): Payer: Self-pay

## 2017-12-16 ENCOUNTER — Encounter (HOSPITAL_COMMUNITY)
Admission: RE | Admit: 2017-12-16 | Discharge: 2017-12-16 | Disposition: A | Discharge status: Home / Self Care | Payer: Medicare HMO | Source: Ambulatory Visit | Attending: Cardiothoracic Surgery | Admitting: Cardiothoracic Surgery

## 2017-12-16 ENCOUNTER — Ambulatory Visit (HOSPITAL_COMMUNITY)
Admission: RE | Admit: 2017-12-16 | Discharge: 2017-12-16 | Disposition: A | Discharge status: Home / Self Care | Payer: Medicare HMO | Source: Ambulatory Visit | Attending: Cardiothoracic Surgery | Admitting: Cardiothoracic Surgery

## 2017-12-16 ENCOUNTER — Ambulatory Visit
Admission: RE | Admit: 2017-12-16 | Discharge: 2017-12-16 | Disposition: A | Discharge status: Home / Self Care | Payer: Medicare HMO | Source: Ambulatory Visit | Attending: Cardiothoracic Surgery | Admitting: Cardiothoracic Surgery

## 2017-12-16 DIAGNOSIS — Z01810 Encounter for preprocedural cardiovascular examination: Secondary | ICD-10-CM | POA: Insufficient documentation

## 2017-12-16 DIAGNOSIS — I350 Nonrheumatic aortic (valve) stenosis: Secondary | ICD-10-CM

## 2017-12-16 DIAGNOSIS — I719 Aortic aneurysm of unspecified site, without rupture: Secondary | ICD-10-CM | POA: Diagnosis not present

## 2017-12-16 DIAGNOSIS — Z01818 Encounter for other preprocedural examination: Secondary | ICD-10-CM

## 2017-12-16 DIAGNOSIS — R918 Other nonspecific abnormal finding of lung field: Secondary | ICD-10-CM | POA: Diagnosis not present

## 2017-12-16 DIAGNOSIS — I351 Nonrheumatic aortic (valve) insufficiency: Secondary | ICD-10-CM

## 2017-12-16 HISTORY — DX: Aneurysm of the ascending aorta, without rupture: I71.21

## 2017-12-16 HISTORY — DX: Gastro-esophageal reflux disease without esophagitis: K21.9

## 2017-12-16 HISTORY — DX: Cardiac murmur, unspecified: R01.1

## 2017-12-16 HISTORY — DX: Tinea unguium: B35.1

## 2017-12-16 HISTORY — DX: Thoracic aortic aneurysm, without rupture: I71.2

## 2017-12-16 LAB — BLOOD GAS, ARTERIAL
Acid-Base Excess: 2.7 mmol/L — ABNORMAL HIGH (ref 0.0–2.0)
Bicarbonate: 26.8 mmol/L (ref 20.0–28.0)
Drawn by: 470591
O2 Saturation: 97.7 %
Patient temperature: 98.6
pCO2 arterial: 42 mmHg (ref 32.0–48.0)
pH, Arterial: 7.421 (ref 7.350–7.450)
pO2, Arterial: 97.4 mmHg (ref 83.0–108.0)

## 2017-12-16 LAB — CBC
HCT: 48 % (ref 39.0–52.0)
Hemoglobin: 16 g/dL (ref 13.0–17.0)
MCH: 30 pg (ref 26.0–34.0)
MCHC: 33.3 g/dL (ref 30.0–36.0)
MCV: 90.1 fL (ref 78.0–100.0)
Platelets: 239 10*3/uL (ref 150–400)
RBC: 5.33 MIL/uL (ref 4.22–5.81)
RDW: 13.3 % (ref 11.5–15.5)
WBC: 8.8 10*3/uL (ref 4.0–10.5)

## 2017-12-16 LAB — PULMONARY FUNCTION TEST
DL/VA % pred: 105 %
DL/VA: 4.91 ml/min/mmHg/L
DLCO unc % pred: 86 %
DLCO unc: 29.21 ml/min/mmHg
FEF 25-75 Post: 2.98 L/sec
FEF 25-75 Pre: 1.58 L/sec
FEF2575-%Change-Post: 87 %
FEF2575-%Pred-Post: 121 %
FEF2575-%Pred-Pre: 64 %
FEV1-%Change-Post: 19 %
FEV1-%Pred-Post: 85 %
FEV1-%Pred-Pre: 71 %
FEV1-Post: 2.83 L
FEV1-Pre: 2.36 L
FEV1FVC-%Change-Post: 8 %
FEV1FVC-%Pred-Pre: 98 %
FEV6-%Change-Post: 10 %
FEV6-%Pred-Post: 85 %
FEV6-%Pred-Pre: 77 %
FEV6-Post: 3.63 L
FEV6-Pre: 3.28 L
FEV6FVC-%Pred-Post: 106 %
FEV6FVC-%Pred-Pre: 106 %
FVC-%Change-Post: 10 %
FVC-%Pred-Post: 80 %
FVC-%Pred-Pre: 72 %
FVC-Post: 3.63 L
FVC-Pre: 3.28 L
Post FEV1/FVC ratio: 78 %
Post FEV6/FVC ratio: 100 %
Pre FEV1/FVC ratio: 72 %
Pre FEV6/FVC Ratio: 100 %
RV % pred: 116 %
RV: 2.96 L
TLC % pred: 92 %
TLC: 6.72 L

## 2017-12-16 LAB — HEMOGLOBIN A1C
Hgb A1c MFr Bld: 5.8 % — ABNORMAL HIGH (ref 4.8–5.6)
Mean Plasma Glucose: 119.76 mg/dL

## 2017-12-16 LAB — URINALYSIS, ROUTINE W REFLEX MICROSCOPIC
Bilirubin Urine: NEGATIVE
Glucose, UA: NEGATIVE mg/dL
Hgb urine dipstick: NEGATIVE
Ketones, ur: NEGATIVE mg/dL
Leukocytes, UA: NEGATIVE
Nitrite: NEGATIVE
Protein, ur: NEGATIVE mg/dL
Specific Gravity, Urine: 1.021 (ref 1.005–1.030)
pH: 5 (ref 5.0–8.0)

## 2017-12-16 LAB — COMPREHENSIVE METABOLIC PANEL
ALT: 26 U/L (ref 17–63)
AST: 26 U/L (ref 15–41)
Albumin: 4.2 g/dL (ref 3.5–5.0)
Alkaline Phosphatase: 51 U/L (ref 38–126)
Anion gap: 13 (ref 5–15)
BUN: 15 mg/dL (ref 6–20)
CO2: 19 mmol/L — ABNORMAL LOW (ref 22–32)
Calcium: 9.7 mg/dL (ref 8.9–10.3)
Chloride: 107 mmol/L (ref 101–111)
Creatinine, Ser: 0.88 mg/dL (ref 0.61–1.24)
GFR calc Af Amer: >60 mL/min (ref >60)
GFR calc non Af Amer: >60 mL/min (ref >60)
Glucose, Bld: 123 mg/dL — ABNORMAL HIGH (ref 65–99)
Potassium: 4 mmol/L (ref 3.5–5.1)
Sodium: 139 mmol/L (ref 135–145)
Total Bilirubin: 1 mg/dL (ref 0.3–1.2)
Total Protein: 7.4 g/dL (ref 6.5–8.1)

## 2017-12-16 LAB — ABO/RH: ABO/RH(D): O POS

## 2017-12-16 LAB — SURGICAL PCR SCREEN
MRSA, PCR: NEGATIVE
Staphylococcus aureus: NEGATIVE

## 2017-12-16 LAB — APTT: aPTT: 34 seconds (ref 24–36)

## 2017-12-16 LAB — PROTIME-INR
INR: 1.03
Prothrombin Time: 13.4 seconds (ref 11.4–15.2)

## 2017-12-16 IMAGING — CR DG CHEST 2V
2 series · 2 of 2 positions shown · non-contrast
Comparison: Chest CT [DATE]

CLINICAL DATA: Preoperative study prior to a aortic valve surgery.

EXAM:
CHEST - 2 VIEW

[w chest pa]
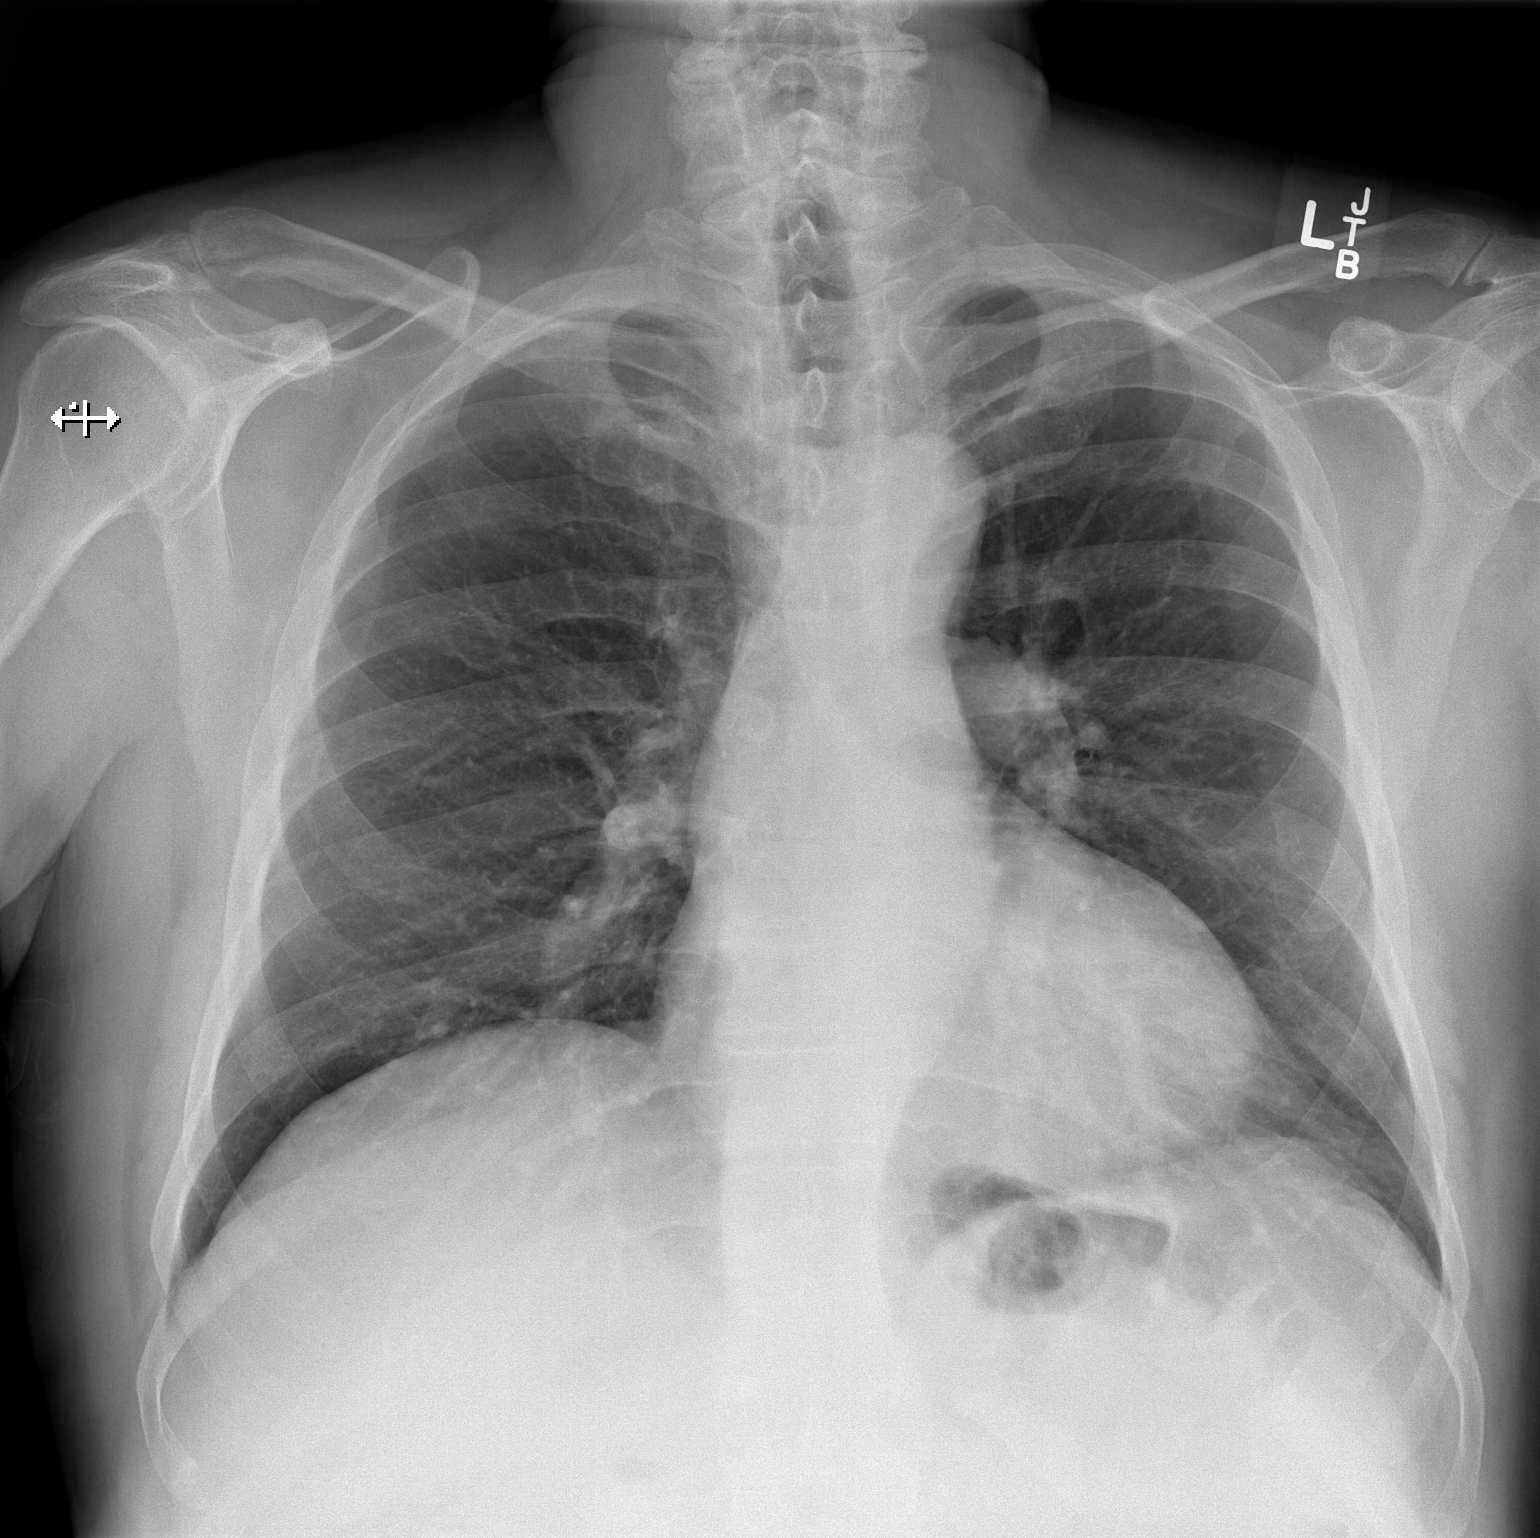

[w chest lat]
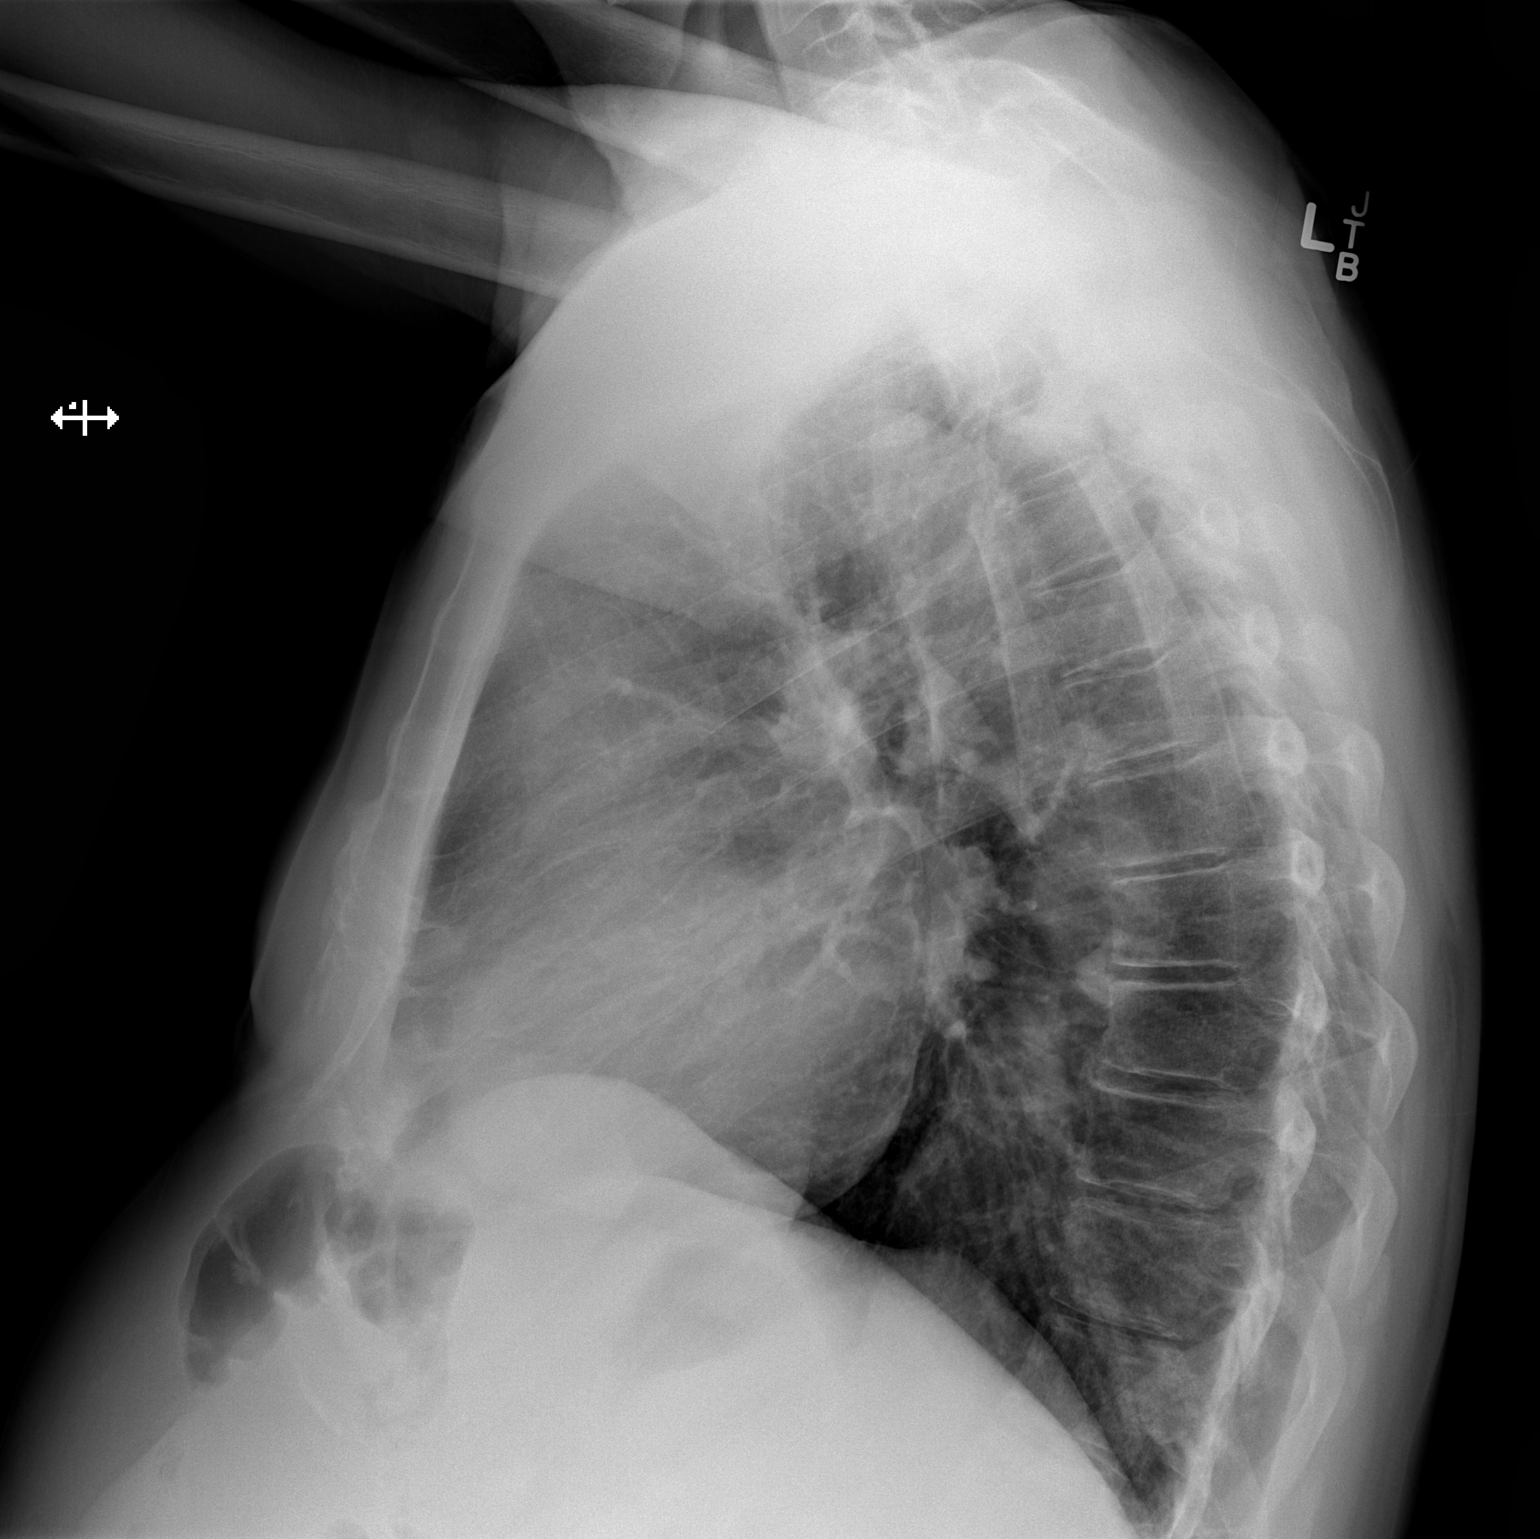

[2 of 2 positions shown; findings below may reference images not displayed]

FINDINGS: The heart, hila, and mediastinum are normal. There is a mildly
tortuous thoracic aorta. No pneumothorax. No nodules or masses. No
focal infiltrates.
IMPRESSION: No active cardiopulmonary disease.

## 2017-12-16 IMAGING — CT CT ANGIO CHEST
2 of 6 series · 13 of 36 positions shown · IV contrast (iopamidol)
Comparison: Prior CTA of the chest on [DATE]

CLINICAL DATA: Aortic stenosis. Preoperative evaluation prior to
planned aortic valve replacement surgery.

EXAM:
CT ANGIOGRAPHY CHEST WITH CONTRAST
TECHNIQUE: Multidetector CT imaging of the chest was performed using the
standard protocol during bolus administration of intravenous
contrast. Multiplanar CT image reconstructions and MIPs were
obtained to evaluate the vascular anatomy.
CONTRAST:  75mL [EK] IOPAMIDOL ([EK]) INJECTION 76%

[Series 5: cta thorax 2.00 bv36 s3 ax axial arterial · axial · arterial · 0.60mm/px · z∈[+1480,+1758]mm · 12 of 165 slices shown]
[im 13/165  lung]
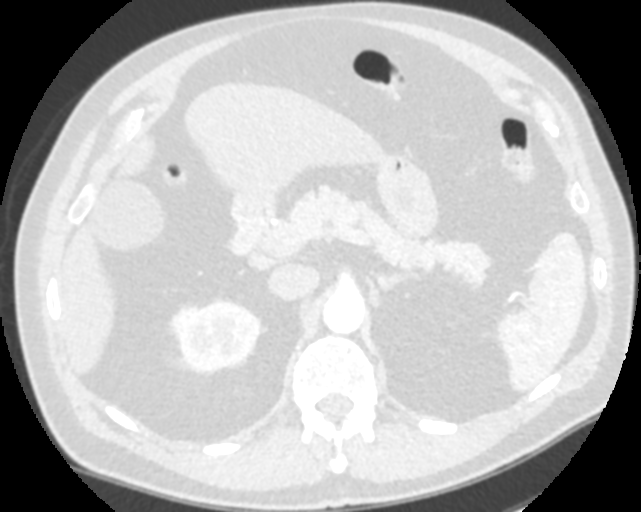
[im 26/165  mediastinal]
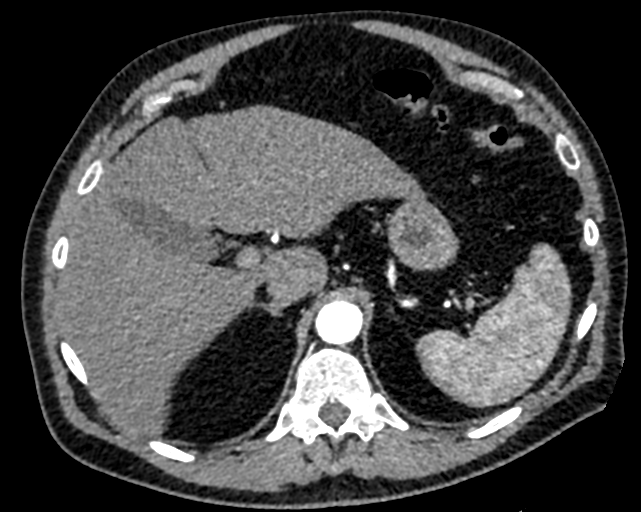
[im 38/165  lung]
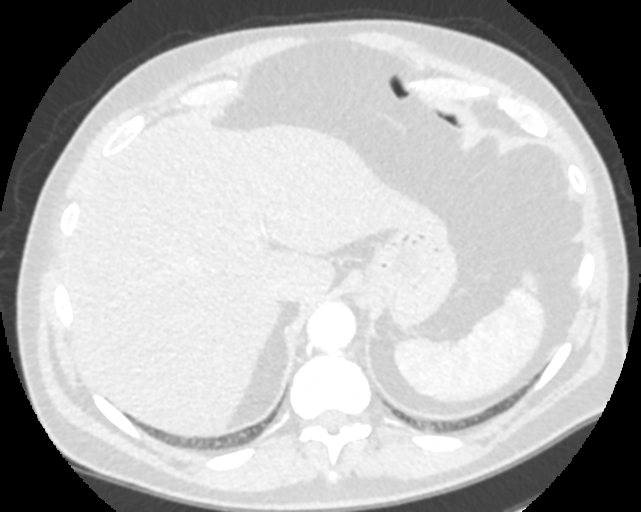
[im 51/165  mediastinal]
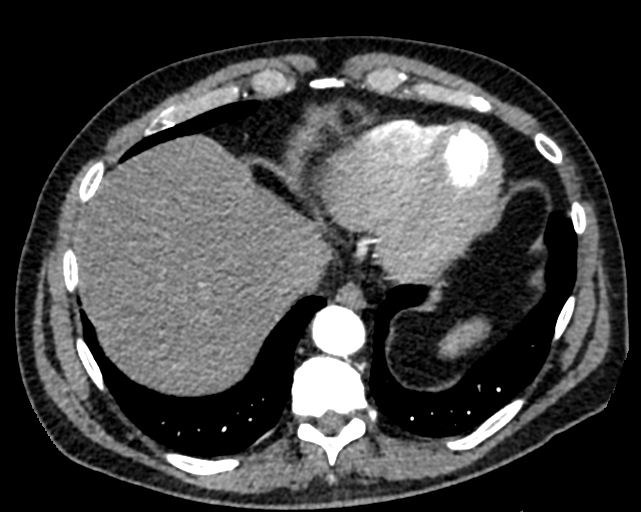
[im 64/165  lung]
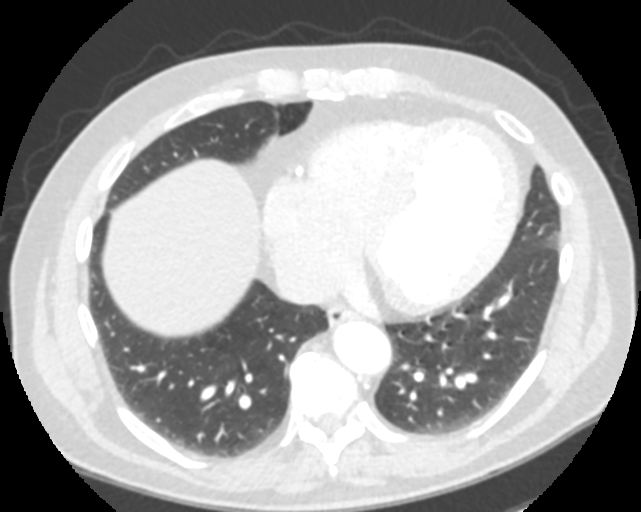
[im 76/165  mediastinal]
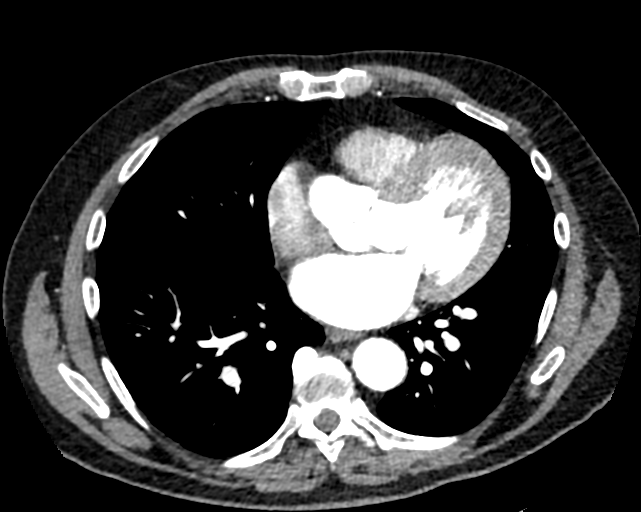
[im 89/165  lung]
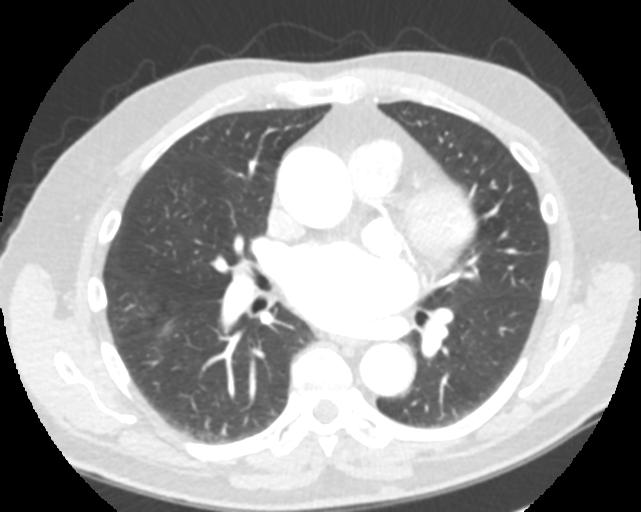
[im 101/165  mediastinal]
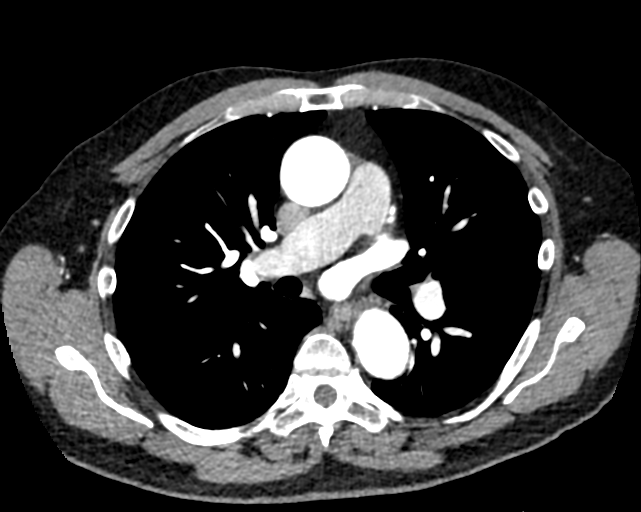
[im 114/165  lung]
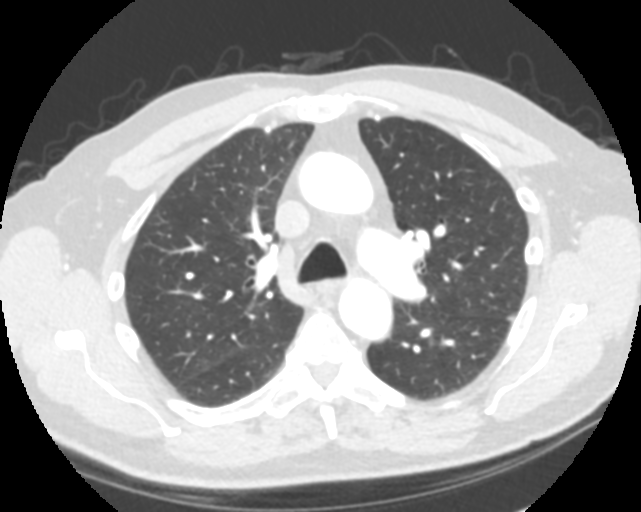
[im 127/165  mediastinal]
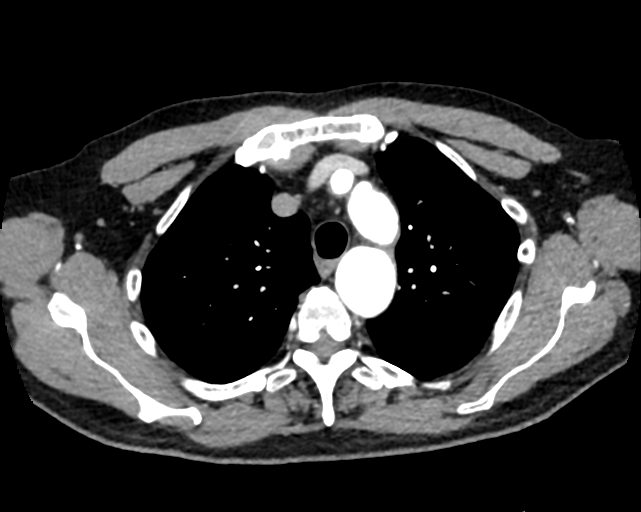
[im 139/165  lung]
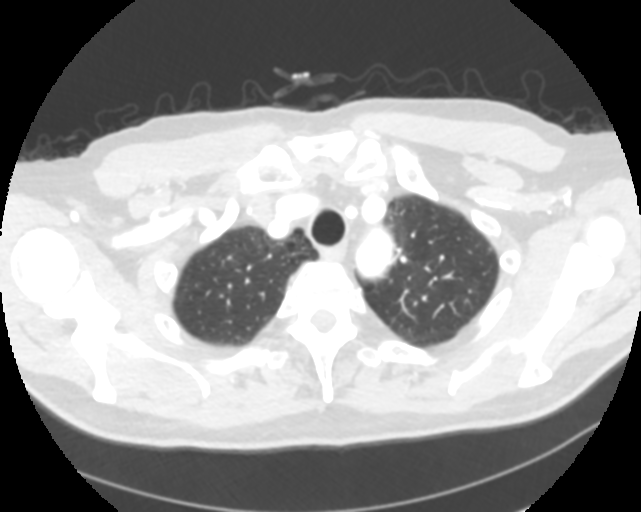
[im 152/165  mediastinal]
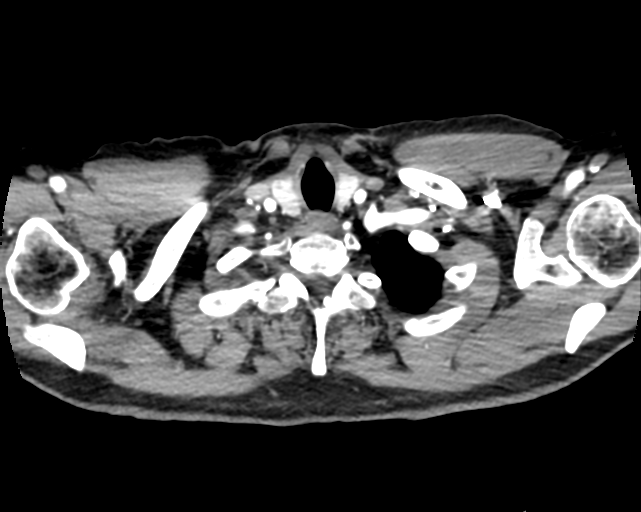

[Series 10: cta thorax 2.00 bv36 s3 cor cor st · coronal · 0.64mm/px · 1 of 154 slices shown]
[im 77/154  mediastinal]
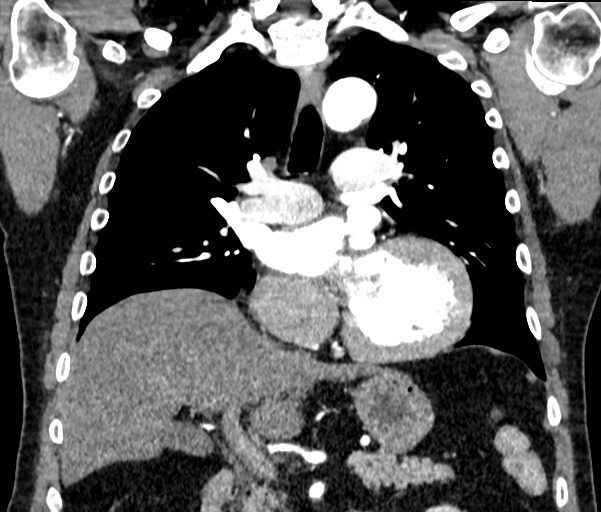

[13 of 36 positions shown; findings below may reference images not displayed]

FINDINGS: Cardiovascular: The aortic valve and annulus are heavily calcified.
The aortic root measures approximately 3.9 cm in diameter at the
level of the sinuses of Valsalva. The ascending thoracic aorta
demonstrates maximal diameter of 4.4 cm. The proximal arch measures
2.9 cm. The distal arch and proximal descending thoracic aorta are
mildly dilated measuring up to 3.6 cm. The lower descending thoracic
aorta measures 3 cm. No evidence of aortic dissection. No
significant atherosclerotic plaque is identified in the thoracic
aorta.

Proximal great vessels are normally patent and demonstrate normal
branching anatomy. The heart size is at the upper limits of normal.
Mild amount of calcified plaque present in the proximal LAD. No
pericardial fluid identified. Central pulmonary arteries are normal
in caliber.

Mediastinum/Nodes: No enlarged mediastinal, hilar, or axillary lymph
nodes. Thyroid gland, trachea, and esophagus demonstrate no
significant findings.

Lungs/Pleura: There is no evidence of pulmonary edema,
consolidation, pneumothorax, nodule or pleural fluid.

Upper Abdomen: No acute abnormality.

Musculoskeletal: No chest wall abnormality. No acute or significant
osseous findings.

Review of the MIP images confirms the above findings.
IMPRESSION: Heavily calcified aortic valve with associated mild aneurysmal
dilatation of the ascending thoracic aorta measuring 4.4 cm in
greatest diameter.

Aortic aneurysm NOS ([EK]-[EK]).

## 2017-12-16 MED ORDER — IOPAMIDOL (ISOVUE-370) INJECTION 76%
75.0000 mL | Freq: Once | INTRAVENOUS | Status: AC | PRN
  Administered 2017-12-16: 75 mL via INTRAVENOUS

## 2017-12-16 MED ORDER — ALBUTEROL SULFATE (2.5 MG/3ML) 0.083% IN NEBU
2.5000 mg | INHALATION_SOLUTION | Freq: Once | RESPIRATORY_TRACT | Status: AC
  Administered 2017-12-16: 2.5 mg via RESPIRATORY_TRACT

## 2017-12-16 NOTE — Progress Notes (Signed)
PCP: Dr. Zachery DauerBarnes Cardiologist: Dr. Anne FuSkains  EKG: 11/25/2027 CXR: Today ECHO: 11/01/2017 Stress Test: denies Cardiac Cath: 11/09/2017  Patient denies shortness of breath, fever, cough, and chest pain at PAT appointment.  Patient verbalized understanding of instructions provided today at the PAT appointment.  Patient asked to review instructions at home and day of surgery.

## 2017-12-16 NOTE — Progress Notes (Signed)
Pre-AVR testing has been completed. 1-39% ICA stenosis bilaterally.  12/16/17 10:40 AM Stephen Mullins RVT

## 2017-12-17 ENCOUNTER — Encounter (HOSPITAL_COMMUNITY): Payer: Self-pay

## 2017-12-17 MED ORDER — POTASSIUM CHLORIDE 2 MEQ/ML IV SOLN
80.0000 meq | INTRAVENOUS | Status: DC
  Filled 2017-12-17: qty 40

## 2017-12-17 MED ORDER — DEXMEDETOMIDINE HCL IN NACL 400 MCG/100ML IV SOLN
0.1000 ug/kg/h | INTRAVENOUS | Status: AC
  Administered 2017-12-20: 0.7 ug/kg/h via INTRAVENOUS
  Filled 2017-12-17: qty 100

## 2017-12-17 MED ORDER — TRANEXAMIC ACID 1000 MG/10ML IV SOLN
1.5000 mg/kg/h | INTRAVENOUS | Status: AC
  Administered 2017-12-20: 1.5 mg/kg/h via INTRAVENOUS
  Filled 2017-12-17: qty 25

## 2017-12-17 MED ORDER — EPINEPHRINE PF 1 MG/ML IJ SOLN
0.0000 ug/min | INTRAVENOUS | Status: DC
  Filled 2017-12-17: qty 4

## 2017-12-17 MED ORDER — NITROGLYCERIN IN D5W 200-5 MCG/ML-% IV SOLN
2.0000 ug/min | INTRAVENOUS | Status: AC
  Administered 2017-12-20: 5 ug/min via INTRAVENOUS
  Filled 2017-12-17: qty 250

## 2017-12-17 MED ORDER — SODIUM CHLORIDE 0.9 % IV SOLN
750.0000 mg | INTRAVENOUS | Status: DC
  Filled 2017-12-17 (×3): qty 750

## 2017-12-17 MED ORDER — DOPAMINE-DEXTROSE 3.2-5 MG/ML-% IV SOLN
0.0000 ug/kg/min | INTRAVENOUS | Status: DC
  Filled 2017-12-17: qty 250

## 2017-12-17 MED ORDER — PLASMA-LYTE 148 IV SOLN
INTRAVENOUS | Status: DC
  Filled 2017-12-17: qty 2.5

## 2017-12-17 MED ORDER — SODIUM CHLORIDE 0.9 % IV SOLN
INTRAVENOUS | Status: DC
  Filled 2017-12-17: qty 30

## 2017-12-17 MED ORDER — MAGNESIUM SULFATE 50 % IJ SOLN
40.0000 meq | INTRAMUSCULAR | Status: DC
  Filled 2017-12-17: qty 9.85

## 2017-12-17 MED ORDER — SODIUM CHLORIDE 0.9 % IV SOLN
30.0000 ug/min | INTRAVENOUS | Status: AC
  Administered 2017-12-20: 30 ug/min via INTRAVENOUS
  Filled 2017-12-17: qty 2

## 2017-12-17 MED ORDER — TRANEXAMIC ACID (OHS) BOLUS VIA INFUSION
15.0000 mg/kg | INTRAVENOUS | Status: AC
  Administered 2017-12-20: 1359 mg via INTRAVENOUS
  Filled 2017-12-17: qty 1359

## 2017-12-17 MED ORDER — SODIUM CHLORIDE 0.9 % IV SOLN
1.5000 g | INTRAVENOUS | Status: AC
  Administered 2017-12-20: 1.5 g via INTRAVENOUS
  Filled 2017-12-17: qty 1.5

## 2017-12-17 MED ORDER — MILRINONE LACTATE IN DEXTROSE 20-5 MG/100ML-% IV SOLN
0.1250 ug/kg/min | INTRAVENOUS | Status: AC
  Administered 2017-12-20: 0.25 ug/kg/min via INTRAVENOUS
  Filled 2017-12-17: qty 100

## 2017-12-17 MED ORDER — TRANEXAMIC ACID (OHS) PUMP PRIME SOLUTION
2.0000 mg/kg | INTRAVENOUS | Status: DC
  Filled 2017-12-17: qty 1.81

## 2017-12-17 MED ORDER — VANCOMYCIN HCL 10 G IV SOLR
1500.0000 mg | INTRAVENOUS | Status: AC
  Administered 2017-12-20: 1500 mg via INTRAVENOUS
  Filled 2017-12-17: qty 1500

## 2017-12-17 MED ORDER — INSULIN REGULAR HUMAN 100 UNIT/ML IJ SOLN
INTRAMUSCULAR | Status: AC
  Administered 2017-12-20: 1 [IU]/h via INTRAVENOUS
  Filled 2017-12-17: qty 1

## 2017-12-17 NOTE — Progress Notes (Signed)
Anesthesia chart review: Patient is a 72 year old male scheduled for aortic valve replacement on 12/20/17 by Dr. Kathlee NationsPeter Van Trigt. 12/16/17 chest CT also showed 4.4 cm TAA. [name].   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 Only mild CAD by 11/2017 cath.  PCP is Dr. Juluis RainierElizabeth Barnes. Cardiologist is Dr. Donato SchultzMark Skains.  Meds include aspirin 81 mg, HCTZ, Toprol-XL, Afrin nasal spray.  BP (!) 158/56   Pulse (!) 57   Temp 36.6 C   Resp 20   Ht 5\' 11"  (1.803 m)   Wt 199 lb 12.8 oz (90.6 kg)   SpO2 95%   BMI 27.87 kg/m   EKG 11/04/17: SB at 57 bpm, LVH with repolarization abnormality.  Cardiac cath 11/09/17:  Prox RCA lesion is 30% stenosed.  Mid RCA lesion is 30% stenosed.  RPDA lesion is 30% stenosed.  Ost Cx to Mid Cx lesion is 20% stenosed.  Ost 1st Mrg lesion is 20% stenosed.  Mid LAD lesion is 20% stenosed.  Ost LM lesion is 20% stenosed. 1. Mild non-obstructive CAD 2. Severe aortic stenosis (mean gradient 35.6 mmHg, peak to peak gradient 36 mmHg, AVA 0.95 cm2). Recommendations: Continue workup for AVR.   Echo 11/01/17: Study Conclusions - Left ventricle: The cavity size was normal. Wall thickness was   increased in a pattern of severe LVH. Systolic function was   normal. The estimated ejection fraction was in the range of 60%   to 65%. Wall motion was normal; there were no regional wall   motion abnormalities. Features are consistent with a pseudonormal   left ventricular filling pattern, with concomitant abnormal   relaxation and increased filling pressure (grade 2 diastolic   dysfunction). - Aortic valve: Mildly calcified annulus. Bicuspid; moderately   thickened, moderately calcified leaflets. There was severe   stenosis. There was moderate to severe regurgitation. Valve area   (VTI): 0.88 cm^2. Valve area (Vmax):  0.81 cm^2. Valve area   (Vmean): 0.73 cm^2. - Left atrium: The atrium was mildly dilated. - Pulmonary arteries: Systolic pressure was mildly increased. PA   peak pressure: 32 mm Hg (S).  Carotid U/S 12/16/17: Final Interpretation: - Right Carotid: Velocities in the right ICA are consistent with a 1-39% stenosis. - Left Carotid: Velocities in the left ICA are consistent with a 1-39% stenosis. - Vertebrals: Bilateral vertebral arteries demonstrate antegrade flow.  CTA chest 12/16/17: IMPRESSION: Heavily calcified aortic valve with associated mild aneurysmal dilatation of the ascending thoracic aorta measuring 4.4 cm in greatest diameter.  CXR 12/16/17: IMPRESSION: No active cardiopulmonary disease.  PFTs 12/16/17: FVC 3.28 (72%), FEV1 2.36 (71%), DLCO unc 29.31 (86%).  Preoperative labs noted. CBC, PT/PTT, UA WNL. Cr 0.88. A1c 5.8.   If no acute changes then I anticipate that he can proceed as planned. Patient is currently posted for AVR, but also has evidence of 4.4 cm ascending TAA on yesterday's CTA which was ordered by Dr. Donata ClayVan Trigt due to finding of mildly dilated ascending aorta on echo. Anesthesiologist and surgeon to evaluate patient on the day of surgery to review definitive anesthesia and surgical plans.  Velna Ochsllison Zelenak, PA-C [facility]/Anesthesiology Phone [phone] 12/17/2017 10:08 AM

## 2017-12-19 NOTE — Anesthesia Preprocedure Evaluation (Addendum)
Anesthesia Evaluation  Patient identified by MRN, date of birth, ID band Patient awake    Reviewed: Allergy & Precautions, NPO status , Patient's Chart, lab work & pertinent test results, reviewed documented beta blocker date and time   History of Anesthesia Complications Negative for: history of anesthetic complications  Airway Mallampati: II  TM Distance: >3 FB Neck ROM: Full    Dental  (+) Dental Advisory Given   Pulmonary former smoker (quit 2003),    breath sounds clear to auscultation       Cardiovascular hypertension, Pt. on medications and Pt. on home beta blockers (-) angina+ CAD (non-obstructive by cath 3/19) and + Peripheral Vascular Disease (mod aortic root dilation)  + Valvular Problems/Murmurs (bicuspid Ao valve) AS  Rhythm:Regular Rate:Normal + Systolic murmurs 1/612/19 ECHO: EF 60-65%, severe AS with mod-severe AI, mean grad 43 mmHg, peak grad 75 mmHg, AVA (VTI) 0.88 cm2   Neuro/Psych negative neurological ROS     GI/Hepatic negative GI ROS, Neg liver ROS,   Endo/Other  negative endocrine ROS  Renal/GU negative Renal ROS     Musculoskeletal   Abdominal   Peds  Hematology negative hematology ROS (+)   Anesthesia Other Findings Prostate cancer  Reproductive/Obstetrics                            Anesthesia Physical Anesthesia Plan  ASA: III  Anesthesia Plan: General   Post-op Pain Management:    Induction: Intravenous  PONV Risk Score and Plan: 2 and Treatment may vary due to age or medical condition  Airway Management Planned: Oral ETT  Additional Equipment: Arterial line, PA Cath, TEE and Ultrasound Guidance Line Placement  Intra-op Plan:   Post-operative Plan: Post-operative intubation/ventilation  Informed Consent: I have reviewed the patients History and Physical, chart, labs and discussed the procedure including the risks, benefits and alternatives for the  proposed anesthesia with the patient or authorized representative who has indicated his/her understanding and acceptance.   Dental advisory given  Plan Discussed with: CRNA and Surgeon  Anesthesia Plan Comments: (Plan routine monitors, A-line, PA catheter, GETA with TEE and post op ventilation)        Anesthesia Quick Evaluation

## 2017-12-20 ENCOUNTER — Inpatient Hospital Stay (HOSPITAL_COMMUNITY): Payer: Medicare HMO | Admitting: Vascular Surgery

## 2017-12-20 ENCOUNTER — Encounter (HOSPITAL_COMMUNITY): Payer: Self-pay | Admitting: *Deleted

## 2017-12-20 ENCOUNTER — Encounter (HOSPITAL_COMMUNITY)
Admission: RE | Disposition: A | Discharge status: Home / Self Care | Payer: Self-pay | Source: Ambulatory Visit | Attending: Cardiothoracic Surgery

## 2017-12-20 ENCOUNTER — Inpatient Hospital Stay (HOSPITAL_COMMUNITY): Payer: Medicare HMO

## 2017-12-20 ENCOUNTER — Inpatient Hospital Stay (HOSPITAL_COMMUNITY): Payer: Medicare HMO | Admitting: Certified Registered"

## 2017-12-20 ENCOUNTER — Inpatient Hospital Stay (HOSPITAL_COMMUNITY)
Admission: RE | Admit: 2017-12-20 | Discharge: 2017-12-25 | DRG: 220 | Disposition: A | Discharge status: Home / Self Care | Payer: Medicare HMO | Source: Ambulatory Visit | Attending: Cardiothoracic Surgery | Admitting: Cardiothoracic Surgery

## 2017-12-20 DIAGNOSIS — E8770 Fluid overload, unspecified: Secondary | ICD-10-CM | POA: Diagnosis not present

## 2017-12-20 DIAGNOSIS — E785 Hyperlipidemia, unspecified: Secondary | ICD-10-CM | POA: Diagnosis not present

## 2017-12-20 DIAGNOSIS — R197 Diarrhea, unspecified: Secondary | ICD-10-CM | POA: Diagnosis not present

## 2017-12-20 DIAGNOSIS — I459 Conduction disorder, unspecified: Secondary | ICD-10-CM | POA: Diagnosis present

## 2017-12-20 DIAGNOSIS — Z8042 Family history of malignant neoplasm of prostate: Secondary | ICD-10-CM | POA: Diagnosis not present

## 2017-12-20 DIAGNOSIS — Z952 Presence of prosthetic heart valve: Secondary | ICD-10-CM

## 2017-12-20 DIAGNOSIS — I480 Paroxysmal atrial fibrillation: Secondary | ICD-10-CM | POA: Diagnosis not present

## 2017-12-20 DIAGNOSIS — Z7982 Long term (current) use of aspirin: Secondary | ICD-10-CM | POA: Diagnosis not present

## 2017-12-20 DIAGNOSIS — I712 Thoracic aortic aneurysm, without rupture: Secondary | ICD-10-CM | POA: Diagnosis present

## 2017-12-20 DIAGNOSIS — I358 Other nonrheumatic aortic valve disorders: Secondary | ICD-10-CM | POA: Diagnosis not present

## 2017-12-20 DIAGNOSIS — Z87891 Personal history of nicotine dependence: Secondary | ICD-10-CM

## 2017-12-20 DIAGNOSIS — Q231 Congenital insufficiency of aortic valve: Secondary | ICD-10-CM

## 2017-12-20 DIAGNOSIS — I77810 Thoracic aortic ectasia: Secondary | ICD-10-CM | POA: Diagnosis not present

## 2017-12-20 DIAGNOSIS — I350 Nonrheumatic aortic (valve) stenosis: Secondary | ICD-10-CM

## 2017-12-20 DIAGNOSIS — D62 Acute posthemorrhagic anemia: Secondary | ICD-10-CM | POA: Diagnosis not present

## 2017-12-20 DIAGNOSIS — R7303 Prediabetes: Secondary | ICD-10-CM | POA: Diagnosis not present

## 2017-12-20 DIAGNOSIS — I10 Essential (primary) hypertension: Secondary | ICD-10-CM | POA: Diagnosis not present

## 2017-12-20 DIAGNOSIS — I352 Nonrheumatic aortic (valve) stenosis with insufficiency: Secondary | ICD-10-CM | POA: Diagnosis not present

## 2017-12-20 DIAGNOSIS — I083 Combined rheumatic disorders of mitral, aortic and tricuspid valves: Secondary | ICD-10-CM | POA: Diagnosis not present

## 2017-12-20 DIAGNOSIS — Z953 Presence of xenogenic heart valve: Secondary | ICD-10-CM

## 2017-12-20 DIAGNOSIS — J9811 Atelectasis: Secondary | ICD-10-CM | POA: Diagnosis not present

## 2017-12-20 DIAGNOSIS — J90 Pleural effusion, not elsewhere classified: Secondary | ICD-10-CM | POA: Diagnosis not present

## 2017-12-20 HISTORY — PX: AORTIC VALVE REPLACEMENT: SHX41

## 2017-12-20 HISTORY — PX: TEE WITHOUT CARDIOVERSION: SHX5443

## 2017-12-20 LAB — POCT I-STAT, CHEM 8
BUN: 17 mg/dL (ref 6–20)
BUN: 15 mg/dL (ref 6–20)
BUN: 16 mg/dL (ref 6–20)
BUN: 14 mg/dL (ref 6–20)
BUN: 14 mg/dL (ref 6–20)
BUN: 12 mg/dL (ref 6–20)
Calcium, Ion: 1.34 mmol/L (ref 1.15–1.40)
Calcium, Ion: 1.13 mmol/L — ABNORMAL LOW (ref 1.15–1.40)
Calcium, Ion: 1.29 mmol/L (ref 1.15–1.40)
Calcium, Ion: 1.13 mmol/L — ABNORMAL LOW (ref 1.15–1.40)
Calcium, Ion: 1.11 mmol/L — ABNORMAL LOW (ref 1.15–1.40)
Calcium, Ion: 1.25 mmol/L (ref 1.15–1.40)
Chloride: 103 mmol/L (ref 101–111)
Chloride: 103 mmol/L (ref 101–111)
Chloride: 105 mmol/L (ref 101–111)
Chloride: 104 mmol/L (ref 101–111)
Chloride: 103 mmol/L (ref 101–111)
Chloride: 107 mmol/L (ref 101–111)
Creatinine, Ser: 0.7 mg/dL (ref 0.61–1.24)
Creatinine, Ser: 0.7 mg/dL (ref 0.61–1.24)
Creatinine, Ser: 0.8 mg/dL (ref 0.61–1.24)
Creatinine, Ser: 0.7 mg/dL (ref 0.61–1.24)
Creatinine, Ser: 0.7 mg/dL (ref 0.61–1.24)
Creatinine, Ser: 0.8 mg/dL (ref 0.61–1.24)
Glucose, Bld: 140 mg/dL — ABNORMAL HIGH (ref 65–99)
Glucose, Bld: 120 mg/dL — ABNORMAL HIGH (ref 65–99)
Glucose, Bld: 134 mg/dL — ABNORMAL HIGH (ref 65–99)
Glucose, Bld: 145 mg/dL — ABNORMAL HIGH (ref 65–99)
Glucose, Bld: 138 mg/dL — ABNORMAL HIGH (ref 65–99)
Glucose, Bld: 96 mg/dL (ref 65–99)
HCT: 44 % (ref 39.0–52.0)
HCT: 34 % — ABNORMAL LOW (ref 39.0–52.0)
HCT: 41 % (ref 39.0–52.0)
HCT: 33 % — ABNORMAL LOW (ref 39.0–52.0)
HCT: 34 % — ABNORMAL LOW (ref 39.0–52.0)
HCT: 34 % — ABNORMAL LOW (ref 39.0–52.0)
Hemoglobin: 15 g/dL (ref 13.0–17.0)
Hemoglobin: 11.6 g/dL — ABNORMAL LOW (ref 13.0–17.0)
Hemoglobin: 13.9 g/dL (ref 13.0–17.0)
Hemoglobin: 11.2 g/dL — ABNORMAL LOW (ref 13.0–17.0)
Hemoglobin: 11.6 g/dL — ABNORMAL LOW (ref 13.0–17.0)
Hemoglobin: 11.6 g/dL — ABNORMAL LOW (ref 13.0–17.0)
Potassium: 4 mmol/L (ref 3.5–5.1)
Potassium: 3.7 mmol/L (ref 3.5–5.1)
Potassium: 4.1 mmol/L (ref 3.5–5.1)
Potassium: 4.3 mmol/L (ref 3.5–5.1)
Potassium: 4.2 mmol/L (ref 3.5–5.1)
Potassium: 3.9 mmol/L (ref 3.5–5.1)
Sodium: 141 mmol/L (ref 135–145)
Sodium: 141 mmol/L (ref 135–145)
Sodium: 142 mmol/L (ref 135–145)
Sodium: 139 mmol/L (ref 135–145)
Sodium: 140 mmol/L (ref 135–145)
Sodium: 142 mmol/L (ref 135–145)
TCO2: 29 mmol/L (ref 22–32)
TCO2: 30 mmol/L (ref 22–32)
TCO2: 30 mmol/L (ref 22–32)
TCO2: 27 mmol/L (ref 22–32)
TCO2: 27 mmol/L (ref 22–32)
TCO2: 25 mmol/L (ref 22–32)

## 2017-12-20 LAB — POCT I-STAT 4, (NA,K, GLUC, HGB,HCT)
Glucose, Bld: 126 mg/dL — ABNORMAL HIGH (ref 65–99)
HCT: 35 % — ABNORMAL LOW (ref 39.0–52.0)
Hemoglobin: 11.9 g/dL — ABNORMAL LOW (ref 13.0–17.0)
Potassium: 4 mmol/L (ref 3.5–5.1)
Sodium: 143 mmol/L (ref 135–145)

## 2017-12-20 LAB — POCT I-STAT 3, ART BLOOD GAS (G3+)
Acid-Base Excess: 5 mmol/L — ABNORMAL HIGH (ref 0.0–2.0)
Acid-base deficit: 2 mmol/L (ref 0.0–2.0)
Acid-base deficit: 7 mmol/L — ABNORMAL HIGH (ref 0.0–2.0)
Acid-base deficit: 1 mmol/L (ref 0.0–2.0)
Acid-base deficit: 2 mmol/L (ref 0.0–2.0)
Bicarbonate: 30.4 mmol/L — ABNORMAL HIGH (ref 20.0–28.0)
Bicarbonate: 24 mmol/L (ref 20.0–28.0)
Bicarbonate: 18.7 mmol/L — ABNORMAL LOW (ref 20.0–28.0)
Bicarbonate: 24.1 mmol/L (ref 20.0–28.0)
Bicarbonate: 23.5 mmol/L (ref 20.0–28.0)
O2 Saturation: 100 %
O2 Saturation: 100 %
O2 Saturation: 97 %
O2 Saturation: 98 %
O2 Saturation: 98 %
Patient temperature: 35.3
Patient temperature: 38
Patient temperature: 38.5
TCO2: 32 mmol/L (ref 22–32)
TCO2: 25 mmol/L (ref 22–32)
TCO2: 20 mmol/L — ABNORMAL LOW (ref 22–32)
TCO2: 25 mmol/L (ref 22–32)
TCO2: 25 mmol/L (ref 22–32)
pCO2 arterial: 49.5 mmHg — ABNORMAL HIGH (ref 32.0–48.0)
pCO2 arterial: 44.9 mmHg (ref 32.0–48.0)
pCO2 arterial: 32.8 mmHg (ref 32.0–48.0)
pCO2 arterial: 44.4 mmHg (ref 32.0–48.0)
pCO2 arterial: 45.3 mmHg (ref 32.0–48.0)
pH, Arterial: 7.396 (ref 7.350–7.450)
pH, Arterial: 7.336 — ABNORMAL LOW (ref 7.350–7.450)
pH, Arterial: 7.356 (ref 7.350–7.450)
pH, Arterial: 7.347 — ABNORMAL LOW (ref 7.350–7.450)
pH, Arterial: 7.331 — ABNORMAL LOW (ref 7.350–7.450)
pO2, Arterial: 398 mmHg — ABNORMAL HIGH (ref 83.0–108.0)
pO2, Arterial: 371 mmHg — ABNORMAL HIGH (ref 83.0–108.0)
pO2, Arterial: 88 mmHg (ref 83.0–108.0)
pO2, Arterial: 109 mmHg — ABNORMAL HIGH (ref 83.0–108.0)
pO2, Arterial: 126 mmHg — ABNORMAL HIGH (ref 83.0–108.0)

## 2017-12-20 LAB — APTT: aPTT: 39 seconds — ABNORMAL HIGH (ref 24–36)

## 2017-12-20 LAB — GLUCOSE, CAPILLARY
Glucose-Capillary: 127 mg/dL — ABNORMAL HIGH (ref 65–99)
Glucose-Capillary: 148 mg/dL — ABNORMAL HIGH (ref 65–99)
Glucose-Capillary: 144 mg/dL — ABNORMAL HIGH (ref 65–99)
Glucose-Capillary: 109 mg/dL — ABNORMAL HIGH (ref 65–99)
Glucose-Capillary: 100 mg/dL — ABNORMAL HIGH (ref 65–99)
Glucose-Capillary: 106 mg/dL — ABNORMAL HIGH (ref 65–99)
Glucose-Capillary: 114 mg/dL — ABNORMAL HIGH (ref 65–99)
Glucose-Capillary: 117 mg/dL — ABNORMAL HIGH (ref 65–99)
Glucose-Capillary: 119 mg/dL — ABNORMAL HIGH (ref 65–99)
Glucose-Capillary: 125 mg/dL — ABNORMAL HIGH (ref 65–99)

## 2017-12-20 LAB — PROTIME-INR
INR: 1.49
Prothrombin Time: 17.9 seconds — ABNORMAL HIGH (ref 11.4–15.2)

## 2017-12-20 LAB — CBC
HCT: 36.7 % — ABNORMAL LOW (ref 39.0–52.0)
HEMATOCRIT: 37.8 % — AB (ref 39.0–52.0)
HEMOGLOBIN: 12.6 g/dL — AB (ref 13.0–17.0)
Hemoglobin: 12.3 g/dL — ABNORMAL LOW (ref 13.0–17.0)
MCH: 29.8 pg (ref 26.0–34.0)
MCH: 29.5 pg (ref 26.0–34.0)
MCHC: 33.3 g/dL (ref 30.0–36.0)
MCHC: 33.5 g/dL (ref 30.0–36.0)
MCV: 89.4 fL (ref 78.0–100.0)
MCV: 88 fL (ref 78.0–100.0)
Platelets: 186 10*3/uL (ref 150–400)
Platelets: 159 10*3/uL (ref 150–400)
RBC: 4.23 MIL/uL (ref 4.22–5.81)
RBC: 4.17 MIL/uL — ABNORMAL LOW (ref 4.22–5.81)
RDW: 13.2 % (ref 11.5–15.5)
RDW: 12.9 % (ref 11.5–15.5)
WBC: 12.6 10*3/uL — ABNORMAL HIGH (ref 4.0–10.5)
WBC: 11.4 10*3/uL — ABNORMAL HIGH (ref 4.0–10.5)

## 2017-12-20 LAB — HEMOGLOBIN AND HEMATOCRIT, BLOOD
HCT: 34.7 % — ABNORMAL LOW (ref 39.0–52.0)
Hemoglobin: 11.9 g/dL — ABNORMAL LOW (ref 13.0–17.0)

## 2017-12-20 LAB — CREATININE, SERUM
Creatinine, Ser: 0.82 mg/dL (ref 0.61–1.24)
GFR calc Af Amer: >60 mL/min (ref >60)
GFR calc non Af Amer: >60 mL/min (ref >60)

## 2017-12-20 LAB — PREPARE RBC (CROSSMATCH)

## 2017-12-20 LAB — MAGNESIUM: Magnesium: 3.1 mg/dL — ABNORMAL HIGH (ref 1.7–2.4)

## 2017-12-20 LAB — PLATELET COUNT: Platelets: 177 10*3/uL (ref 150–400)

## 2017-12-20 IMAGING — DX DG CHEST 1V PORT
1 series · 1 of 1 positions shown · non-contrast
Comparison: [DATE].

CLINICAL DATA: Status post aortic valve replacement.

EXAM:
PORTABLE CHEST 1 VIEW

[chest ap]
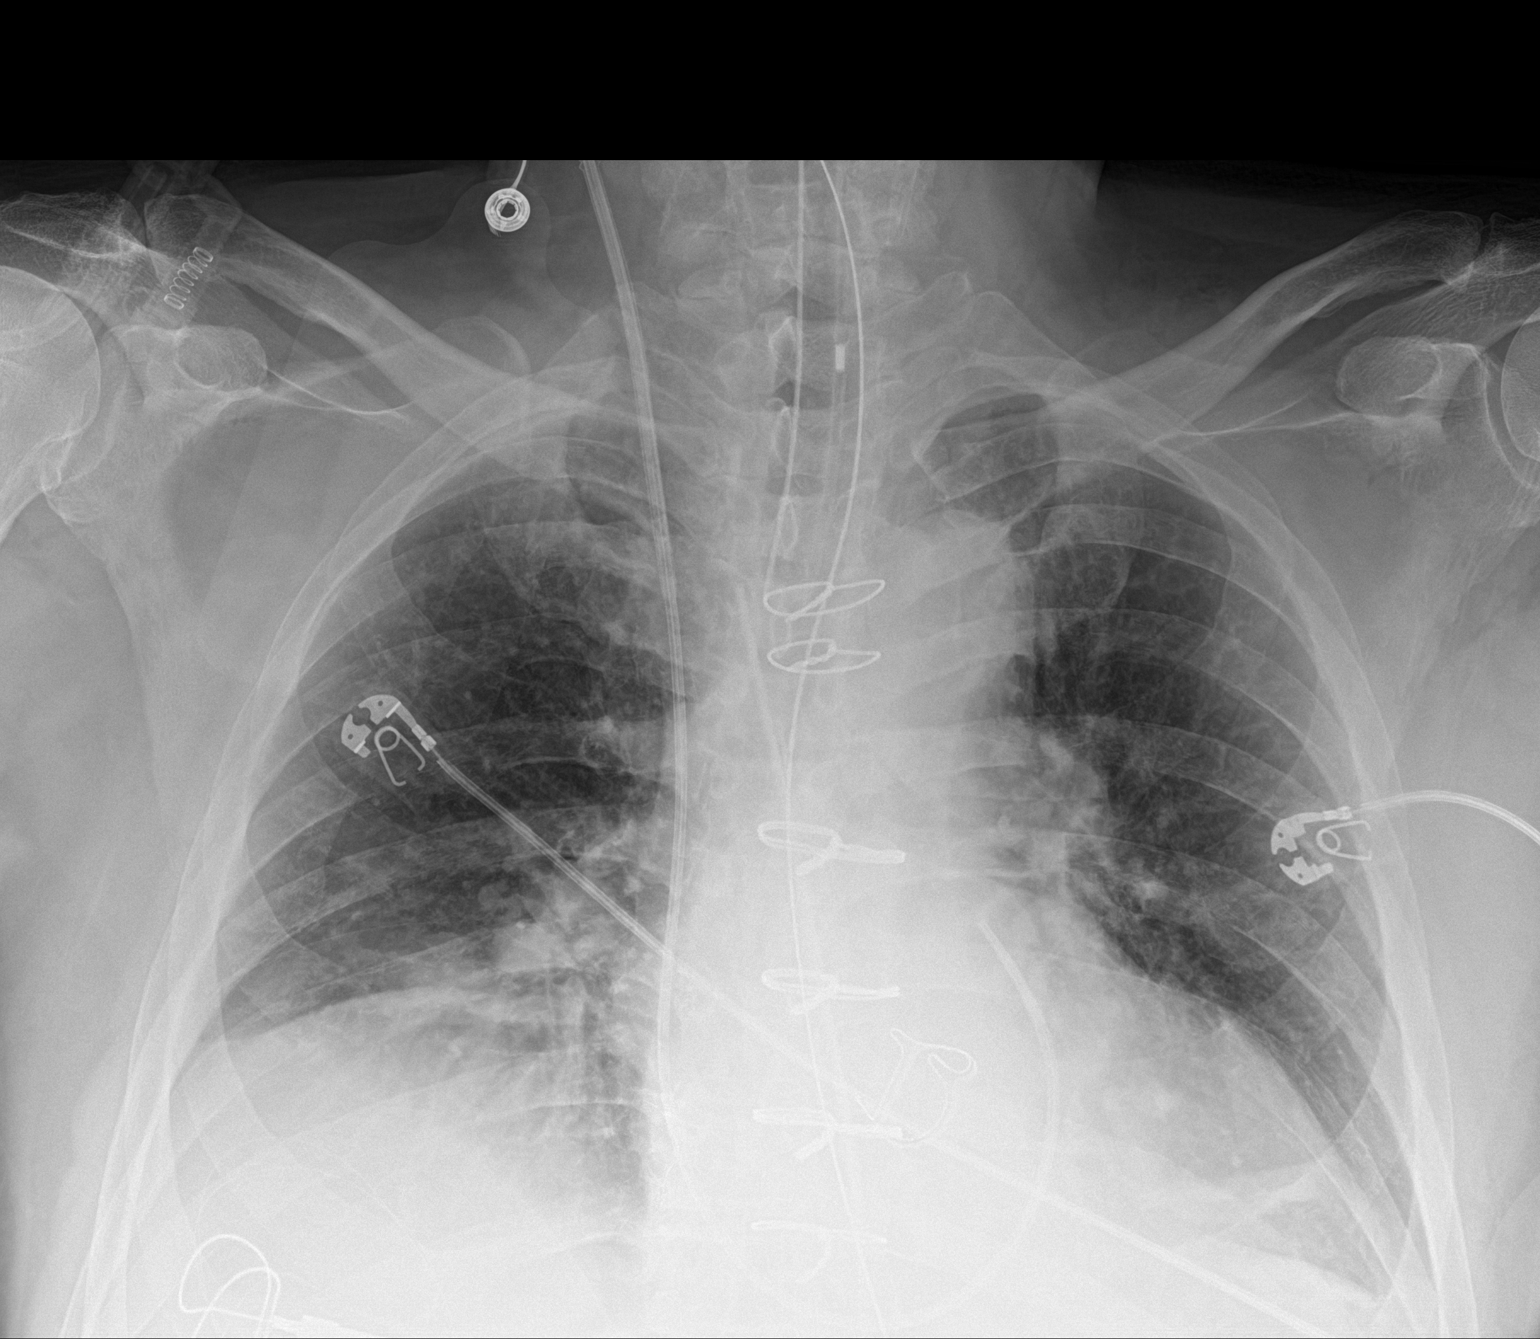

[1 of 1 positions shown; findings below may reference images not displayed]

FINDINGS: Cardiomegaly. Median sternotomy, for AVR. ET tube 3.4 cm above
carina, satisfactory. Swan-Ganz catheter tip RIGHT ventricular
outflow tract. Mediastinal tube good position. Enteric tube appears
to lie in the stomach. Bibasilar subsegmental atelectasis without
consolidation or edema. RIGHT pleural effusion. No pneumothorax.
IMPRESSION: Satisfactory postoperative appearance following AVR.

## 2017-12-20 SURGERY — REPLACEMENT, AORTIC VALVE, OPEN
Anesthesia: General | Site: Chest

## 2017-12-20 MED ORDER — CHLORHEXIDINE GLUCONATE CLOTH 2 % EX PADS
6.0000 | MEDICATED_PAD | Freq: Every day | CUTANEOUS | Status: DC
  Administered 2017-12-20 – 2017-12-23 (×4): 6 via TOPICAL

## 2017-12-20 MED ORDER — ORAL CARE MOUTH RINSE
15.0000 mL | Freq: Two times a day (BID) | OROMUCOSAL | Status: DC
  Administered 2017-12-22 – 2017-12-23 (×2): 15 mL via OROMUCOSAL

## 2017-12-20 MED ORDER — THROMBIN 5000 UNITS EX SOLR
CUTANEOUS | Status: AC
  Filled 2017-12-20: qty 5000

## 2017-12-20 MED ORDER — FENTANYL CITRATE (PF) 250 MCG/5ML IJ SOLN
INTRAMUSCULAR | Status: AC
  Filled 2017-12-20: qty 25

## 2017-12-20 MED ORDER — SODIUM CHLORIDE 0.9 % IV SOLN: Freq: Once | INTRAVENOUS | Status: DC

## 2017-12-20 MED ORDER — METOPROLOL TARTRATE 12.5 MG HALF TABLET: 12.5000 mg | ORAL_TABLET | Freq: Once | ORAL | Status: DC

## 2017-12-20 MED ORDER — MAGNESIUM SULFATE 4 GM/100ML IV SOLN
4.0000 g | Freq: Once | INTRAVENOUS | Status: AC
  Administered 2017-12-20: 4 g via INTRAVENOUS
  Filled 2017-12-20: qty 100

## 2017-12-20 MED ORDER — DEXMEDETOMIDINE HCL IN NACL 200 MCG/50ML IV SOLN
0.0000 ug/kg/h | INTRAVENOUS | Status: DC
  Administered 2017-12-20: 0.5 ug/kg/h via INTRAVENOUS
  Filled 2017-12-20: qty 50

## 2017-12-20 MED ORDER — SODIUM CHLORIDE 0.9 % IV SOLN
INTRAVENOUS | Status: DC | PRN
  Administered 2017-12-20: 11:00:00 via INTRAVENOUS

## 2017-12-20 MED ORDER — THROMBIN 5000 UNITS EX SOLR
CUTANEOUS | Status: DC | PRN
  Administered 2017-12-20: 5000 [IU] via TOPICAL

## 2017-12-20 MED ORDER — TRAMADOL HCL 50 MG PO TABS
50.0000 mg | ORAL_TABLET | ORAL | Status: DC | PRN
  Administered 2017-12-20 – 2017-12-21 (×5): 100 mg via ORAL
  Filled 2017-12-20 (×5): qty 2

## 2017-12-20 MED ORDER — ASPIRIN EC 325 MG PO TBEC
325.0000 mg | DELAYED_RELEASE_TABLET | Freq: Every day | ORAL | Status: DC
  Administered 2017-12-21 – 2017-12-25 (×5): 325 mg via ORAL
  Filled 2017-12-20 (×5): qty 1

## 2017-12-20 MED ORDER — POTASSIUM CHLORIDE 10 MEQ/50ML IV SOLN
10.0000 meq | INTRAVENOUS | Status: AC
  Administered 2017-12-20: 10 meq via INTRAVENOUS

## 2017-12-20 MED ORDER — MORPHINE SULFATE (PF) 2 MG/ML IV SOLN
1.0000 mg | INTRAVENOUS | Status: DC | PRN
  Administered 2017-12-20 (×2): 2 mg via INTRAVENOUS

## 2017-12-20 MED ORDER — PROPOFOL 10 MG/ML IV BOLUS
INTRAVENOUS | Status: DC | PRN
  Administered 2017-12-20: 20 mg via INTRAVENOUS

## 2017-12-20 MED ORDER — METOCLOPRAMIDE HCL 5 MG/ML IJ SOLN
10.0000 mg | Freq: Four times a day (QID) | INTRAMUSCULAR | Status: AC
  Administered 2017-12-20 – 2017-12-25 (×20): 10 mg via INTRAVENOUS
  Filled 2017-12-20 (×20): qty 2

## 2017-12-20 MED ORDER — PHENYLEPHRINE HCL 10 MG/ML IJ SOLN
INTRAVENOUS | Status: DC | PRN
  Administered 2017-12-20: 25 ug/min via INTRAVENOUS

## 2017-12-20 MED ORDER — MORPHINE SULFATE (PF) 2 MG/ML IV SOLN
2.0000 mg | INTRAVENOUS | Status: DC | PRN
  Filled 2017-12-20 (×2): qty 1

## 2017-12-20 MED ORDER — SODIUM CHLORIDE 0.9 % IV SOLN
0.0000 ug/min | INTRAVENOUS | Status: DC
  Administered 2017-12-21: 25 ug/min via INTRAVENOUS
  Filled 2017-12-20: qty 20
  Filled 2017-12-20 (×2): qty 2

## 2017-12-20 MED ORDER — PROPOFOL 10 MG/ML IV BOLUS
INTRAVENOUS | Status: AC
  Filled 2017-12-20: qty 20

## 2017-12-20 MED ORDER — ASPIRIN 81 MG PO CHEW
324.0000 mg | CHEWABLE_TABLET | Freq: Every day | ORAL | Status: DC
  Filled 2017-12-20: qty 4

## 2017-12-20 MED ORDER — HEPARIN SODIUM (PORCINE) 1000 UNIT/ML IJ SOLN
INTRAMUSCULAR | Status: DC | PRN
  Administered 2017-12-20: 32000 [IU] via INTRAVENOUS

## 2017-12-20 MED ORDER — FAMOTIDINE IN NACL 20-0.9 MG/50ML-% IV SOLN
20.0000 mg | Freq: Two times a day (BID) | INTRAVENOUS | Status: AC
  Administered 2017-12-20: 20 mg via INTRAVENOUS

## 2017-12-20 MED ORDER — SODIUM CHLORIDE 0.9% FLUSH
3.0000 mL | Freq: Two times a day (BID) | INTRAVENOUS | Status: DC
  Administered 2017-12-21 – 2017-12-25 (×7): 3 mL via INTRAVENOUS

## 2017-12-20 MED ORDER — SODIUM CHLORIDE 0.9% FLUSH
10.0000 mL | Freq: Two times a day (BID) | INTRAVENOUS | Status: DC
  Administered 2017-12-21 – 2017-12-24 (×5): 10 mL

## 2017-12-20 MED ORDER — CEFAZOLIN SODIUM-DEXTROSE 2-4 GM/100ML-% IV SOLN
2.0000 g | Freq: Three times a day (TID) | INTRAVENOUS | Status: AC
  Administered 2017-12-20 – 2017-12-22 (×6): 2 g via INTRAVENOUS
  Filled 2017-12-20 (×6): qty 100

## 2017-12-20 MED ORDER — LACTATED RINGERS IV SOLN: INTRAVENOUS | Status: DC

## 2017-12-20 MED ORDER — SODIUM CHLORIDE 0.9 % IJ SOLN
OROMUCOSAL | Status: DC | PRN
  Administered 2017-12-20 (×3): 4 mL via TOPICAL

## 2017-12-20 MED ORDER — MIDAZOLAM HCL 10 MG/2ML IJ SOLN
INTRAMUSCULAR | Status: AC
  Filled 2017-12-20: qty 2

## 2017-12-20 MED ORDER — SODIUM CHLORIDE 0.9% FLUSH: 10.0000 mL | INTRAVENOUS | Status: DC | PRN

## 2017-12-20 MED ORDER — SODIUM CHLORIDE 0.45 % IV SOLN
INTRAVENOUS | Status: DC | PRN
  Administered 2017-12-20: 20 mL/h via INTRAVENOUS

## 2017-12-20 MED ORDER — MIDAZOLAM HCL 5 MG/5ML IJ SOLN
INTRAMUSCULAR | Status: DC | PRN
  Administered 2017-12-20: 3 mg via INTRAVENOUS
  Administered 2017-12-20 (×2): 2 mg via INTRAVENOUS
  Administered 2017-12-20: 3 mg via INTRAVENOUS

## 2017-12-20 MED ORDER — PANTOPRAZOLE SODIUM 40 MG PO TBEC
40.0000 mg | DELAYED_RELEASE_TABLET | Freq: Every day | ORAL | Status: DC
  Administered 2017-12-22 – 2017-12-25 (×4): 40 mg via ORAL
  Filled 2017-12-20 (×4): qty 1

## 2017-12-20 MED ORDER — 0.9 % SODIUM CHLORIDE (POUR BTL) OPTIME
TOPICAL | Status: DC | PRN
  Administered 2017-12-20: 1000 mL

## 2017-12-20 MED ORDER — ACETAMINOPHEN 500 MG PO TABS
1000.0000 mg | ORAL_TABLET | Freq: Four times a day (QID) | ORAL | Status: DC
  Administered 2017-12-20 – 2017-12-25 (×18): 1000 mg via ORAL
  Filled 2017-12-20 (×19): qty 2

## 2017-12-20 MED ORDER — SODIUM CHLORIDE 0.9 % IV SOLN
250.0000 mL | INTRAVENOUS | Status: DC
  Administered 2017-12-21: 250 mL via INTRAVENOUS

## 2017-12-20 MED ORDER — INSULIN REGULAR BOLUS VIA INFUSION
0.0000 [IU] | Freq: Three times a day (TID) | INTRAVENOUS | Status: DC
  Filled 2017-12-20: qty 10

## 2017-12-20 MED ORDER — MIDAZOLAM HCL 2 MG/2ML IJ SOLN
2.0000 mg | INTRAMUSCULAR | Status: DC | PRN
  Administered 2017-12-20 (×2): 2 mg via INTRAVENOUS
  Filled 2017-12-20 (×2): qty 2

## 2017-12-20 MED ORDER — LACTATED RINGERS IV SOLN: 500.0000 mL | Freq: Once | INTRAVENOUS | Status: DC | PRN

## 2017-12-20 MED ORDER — SODIUM CHLORIDE 0.9 % IV SOLN
INTRAVENOUS | Status: DC | PRN
  Administered 2017-12-20: 750 mg via INTRAVENOUS

## 2017-12-20 MED ORDER — CHLORHEXIDINE GLUCONATE 4 % EX LIQD: 30.0000 mL | CUTANEOUS | Status: DC

## 2017-12-20 MED ORDER — ACETAMINOPHEN 160 MG/5ML PO SOLN: 1000.0000 mg | Freq: Four times a day (QID) | ORAL | Status: DC

## 2017-12-20 MED ORDER — ALBUMIN HUMAN 5 % IV SOLN
250.0000 mL | INTRAVENOUS | Status: AC | PRN
  Administered 2017-12-20 – 2017-12-21 (×3): 250 mL via INTRAVENOUS
  Filled 2017-12-20: qty 250

## 2017-12-20 MED ORDER — HEMOSTATIC AGENTS (NO CHARGE) OPTIME
TOPICAL | Status: DC | PRN
  Administered 2017-12-20 (×4): 1 via TOPICAL

## 2017-12-20 MED ORDER — ACETAMINOPHEN 650 MG RE SUPP
650.0000 mg | Freq: Once | RECTAL | Status: AC
  Administered 2017-12-20: 650 mg via RECTAL

## 2017-12-20 MED ORDER — VASOPRESSIN 20 UNIT/ML IV SOLN
INTRAVENOUS | Status: AC
Start: 2017-12-20 — End: 2017-12-20
  Filled 2017-12-20: qty 1

## 2017-12-20 MED ORDER — ALBUMIN HUMAN 5 % IV SOLN
INTRAVENOUS | Status: DC | PRN
  Administered 2017-12-20 (×2): via INTRAVENOUS

## 2017-12-20 MED ORDER — LACTATED RINGERS IV SOLN
INTRAVENOUS | Status: DC | PRN
  Administered 2017-12-20: 07:00:00 via INTRAVENOUS

## 2017-12-20 MED ORDER — METOPROLOL TARTRATE 12.5 MG HALF TABLET: 12.5000 mg | ORAL_TABLET | Freq: Two times a day (BID) | ORAL | Status: DC

## 2017-12-20 MED ORDER — CHLORHEXIDINE GLUCONATE 0.12 % MT SOLN
15.0000 mL | Freq: Once | OROMUCOSAL | Status: AC
  Administered 2017-12-20: 15 mL via OROMUCOSAL
  Filled 2017-12-20: qty 15

## 2017-12-20 MED ORDER — NITROGLYCERIN IN D5W 200-5 MCG/ML-% IV SOLN: 0.0000 ug/min | INTRAVENOUS | Status: DC

## 2017-12-20 MED ORDER — PROTAMINE SULFATE 10 MG/ML IV SOLN
INTRAVENOUS | Status: DC | PRN
  Administered 2017-12-20: 320 mg via INTRAVENOUS

## 2017-12-20 MED ORDER — ONDANSETRON HCL 4 MG/2ML IJ SOLN
4.0000 mg | Freq: Four times a day (QID) | INTRAMUSCULAR | Status: DC | PRN
  Administered 2017-12-21 – 2017-12-22 (×2): 4 mg via INTRAVENOUS
  Filled 2017-12-20 (×2): qty 2

## 2017-12-20 MED ORDER — SODIUM CHLORIDE 0.9 % IV SOLN: INTRAVENOUS | Status: DC

## 2017-12-20 MED ORDER — SODIUM CHLORIDE 0.9 % IV SOLN
INTRAVENOUS | Status: DC
  Administered 2017-12-21: 2.2 [IU]/h via INTRAVENOUS
  Filled 2017-12-20: qty 1

## 2017-12-20 MED ORDER — ROCURONIUM BROMIDE 10 MG/ML (PF) SYRINGE
PREFILLED_SYRINGE | INTRAVENOUS | Status: AC
Start: 2017-12-20 — End: 2017-12-20
  Filled 2017-12-20: qty 5

## 2017-12-20 MED ORDER — VANCOMYCIN HCL IN DEXTROSE 1-5 GM/200ML-% IV SOLN
1000.0000 mg | INTRAVENOUS | Status: AC
  Administered 2017-12-20 – 2017-12-21 (×2): 1000 mg via INTRAVENOUS
  Filled 2017-12-20 (×2): qty 200

## 2017-12-20 MED ORDER — CHLORHEXIDINE GLUCONATE 0.12 % MT SOLN
15.0000 mL | OROMUCOSAL | Status: AC
  Administered 2017-12-20: 15 mL via OROMUCOSAL
  Filled 2017-12-20: qty 15

## 2017-12-20 MED ORDER — ROCURONIUM BROMIDE 100 MG/10ML IV SOLN
INTRAVENOUS | Status: DC | PRN
  Administered 2017-12-20 (×4): 50 mg via INTRAVENOUS

## 2017-12-20 MED ORDER — BISACODYL 10 MG RE SUPP: 10.0000 mg | Freq: Every day | RECTAL | Status: DC

## 2017-12-20 MED ORDER — DOCUSATE SODIUM 100 MG PO CAPS
200.0000 mg | ORAL_CAPSULE | Freq: Every day | ORAL | Status: DC
  Administered 2017-12-21 – 2017-12-22 (×2): 200 mg via ORAL
  Filled 2017-12-20 (×2): qty 2

## 2017-12-20 MED ORDER — MILRINONE LACTATE IN DEXTROSE 20-5 MG/100ML-% IV SOLN
0.1250 ug/kg/min | INTRAVENOUS | Status: DC
  Administered 2017-12-20 – 2017-12-21 (×2): 0.3 ug/kg/min via INTRAVENOUS
  Filled 2017-12-20 (×2): qty 100

## 2017-12-20 MED ORDER — LACTATED RINGERS IV SOLN
INTRAVENOUS | Status: DC
  Administered 2017-12-21: 07:00:00 via INTRAVENOUS

## 2017-12-20 MED ORDER — METOPROLOL TARTRATE 5 MG/5ML IV SOLN
2.5000 mg | INTRAVENOUS | Status: DC | PRN
  Administered 2017-12-21: 5 mg via INTRAVENOUS
  Administered 2017-12-22 (×2): 2.5 mg via INTRAVENOUS
  Administered 2017-12-22: 4 mg via INTRAVENOUS
  Administered 2017-12-23: 5 mg via INTRAVENOUS
  Filled 2017-12-20 (×5): qty 5

## 2017-12-20 MED ORDER — METOPROLOL TARTRATE 25 MG/10 ML ORAL SUSPENSION: 12.5000 mg | Freq: Two times a day (BID) | ORAL | Status: DC

## 2017-12-20 MED ORDER — BISACODYL 5 MG PO TBEC
10.0000 mg | DELAYED_RELEASE_TABLET | Freq: Every day | ORAL | Status: DC
  Administered 2017-12-21 – 2017-12-22 (×2): 10 mg via ORAL
  Filled 2017-12-20 (×2): qty 2

## 2017-12-20 MED ORDER — SODIUM BICARBONATE 8.4 % IV SOLN
50.0000 meq | Freq: Once | INTRAVENOUS | Status: AC
  Administered 2017-12-20: 50 meq via INTRAVENOUS

## 2017-12-20 MED ORDER — FENTANYL CITRATE (PF) 250 MCG/5ML IJ SOLN
INTRAMUSCULAR | Status: DC | PRN
  Administered 2017-12-20: 100 ug via INTRAVENOUS
  Administered 2017-12-20: 50 ug via INTRAVENOUS
  Administered 2017-12-20: 700 ug via INTRAVENOUS
  Administered 2017-12-20: 100 ug via INTRAVENOUS
  Administered 2017-12-20: 300 ug via INTRAVENOUS

## 2017-12-20 MED ORDER — ACETAMINOPHEN 160 MG/5ML PO SOLN: 650.0000 mg | Freq: Once | ORAL | Status: AC

## 2017-12-20 MED ORDER — OXYCODONE HCL 5 MG PO TABS
5.0000 mg | ORAL_TABLET | ORAL | Status: DC | PRN
  Administered 2017-12-20: 5 mg via ORAL
  Administered 2017-12-21 – 2017-12-22 (×7): 10 mg via ORAL
  Administered 2017-12-22: 5 mg via ORAL
  Administered 2017-12-23: 10 mg via ORAL
  Filled 2017-12-20: qty 2
  Filled 2017-12-20: qty 1
  Filled 2017-12-20 (×2): qty 2
  Filled 2017-12-20: qty 1
  Filled 2017-12-20 (×5): qty 2
  Filled 2017-12-20: qty 1

## 2017-12-20 MED ORDER — SODIUM CHLORIDE 0.9% FLUSH: 3.0000 mL | INTRAVENOUS | Status: DC | PRN

## 2017-12-20 SURGICAL SUPPLY — 87 items
ADAPTER CARDIO PERF ANTE/RETRO (ADAPTER) ×4 IMPLANT
BAG DECANTER FOR FLEXI CONT (MISCELLANEOUS) ×4 IMPLANT
BLADE STERNUM SYSTEM 6 (BLADE) ×4 IMPLANT
BLADE SURG 12 STRL SS (BLADE) ×4 IMPLANT
BLADE SURG 15 STRL LF DISP TIS (BLADE) ×2 IMPLANT
BLADE SURG 15 STRL SS (BLADE) ×2
CANISTER SUCT 3000ML PPV (MISCELLANEOUS) ×4 IMPLANT
CANNULA GUNDRY RCSP 15FR (MISCELLANEOUS) ×4 IMPLANT
CATH CPB KIT VANTRIGT (MISCELLANEOUS) ×4 IMPLANT
CATH HEART VENT LEFT (CATHETERS) ×4 IMPLANT
CATH RETROPLEGIA CORONARY 14FR (CATHETERS) IMPLANT
CATH ROBINSON RED A/P 18FR (CATHETERS) ×12 IMPLANT
CATH THORACIC 36FR RT ANG (CATHETERS) ×4 IMPLANT
CATH/SQUID NICHOLS JEHLE COR (CATHETERS) ×4 IMPLANT
CLIP FOGARTY SPRING 6M (CLIP) IMPLANT
CONT SPEC 4OZ CLIKSEAL STRL BL (MISCELLANEOUS) ×4 IMPLANT
COVER SURGICAL LIGHT HANDLE (MISCELLANEOUS) IMPLANT
CRADLE DONUT ADULT HEAD (MISCELLANEOUS) ×4 IMPLANT
DRAIN CHANNEL 32F RND 10.7 FF (WOUND CARE) ×4 IMPLANT
DRAPE CARDIOVASCULAR INCISE (DRAPES) ×2
DRAPE SLUSH/WARMER DISC (DRAPES) ×4 IMPLANT
DRAPE SRG 135X102X78XABS (DRAPES) ×2 IMPLANT
DRSG AQUACEL AG ADV 3.5X14 (GAUZE/BANDAGES/DRESSINGS) ×4 IMPLANT
ELECT BLADE 4.0 EZ CLEAN MEGAD (MISCELLANEOUS) ×4
ELECT BLADE 6.5 EXT (BLADE) ×4 IMPLANT
ELECT CAUTERY BLADE 6.4 (BLADE) ×4 IMPLANT
ELECT REM PT RETURN 9FT ADLT (ELECTROSURGICAL) ×8
ELECTRODE BLDE 4.0 EZ CLN MEGD (MISCELLANEOUS) ×2 IMPLANT
ELECTRODE REM PT RTRN 9FT ADLT (ELECTROSURGICAL) ×4 IMPLANT
FELT TEFLON 1X6 (MISCELLANEOUS) ×4 IMPLANT
GAUZE SPONGE 4X4 12PLY STRL (GAUZE/BANDAGES/DRESSINGS) ×8 IMPLANT
GLOVE BIO SURGEON STRL SZ7.5 (GLOVE) ×12 IMPLANT
GLOVE BIOGEL M 6.5 STRL (GLOVE) ×12 IMPLANT
GLOVE BIOGEL M STER SZ 6 (GLOVE) ×12 IMPLANT
GLOVE BIOGEL PI IND STRL 6 (GLOVE) ×4 IMPLANT
GLOVE BIOGEL PI IND STRL 6.5 (GLOVE) ×8 IMPLANT
GLOVE BIOGEL PI IND STRL 7.0 (GLOVE) ×4 IMPLANT
GLOVE BIOGEL PI INDICATOR 6 (GLOVE) ×4
GLOVE BIOGEL PI INDICATOR 6.5 (GLOVE) ×8
GLOVE BIOGEL PI INDICATOR 7.0 (GLOVE) ×4
GOWN STRL REUS W/ TWL LRG LVL3 (GOWN DISPOSABLE) ×14 IMPLANT
GOWN STRL REUS W/TWL LRG LVL3 (GOWN DISPOSABLE) ×14
HEMOSTAT POWDER SURGIFOAM 1G (HEMOSTASIS) ×12 IMPLANT
HEMOSTAT SURGICEL 2X14 (HEMOSTASIS) ×4 IMPLANT
INSERT FOGARTY XLG (MISCELLANEOUS) IMPLANT
KIT BASIN OR (CUSTOM PROCEDURE TRAY) ×4 IMPLANT
KIT SUCTION CATH 14FR (SUCTIONS) ×4 IMPLANT
KIT TURNOVER KIT B (KITS) ×4 IMPLANT
LEAD PACING MYOCARDI (MISCELLANEOUS) ×4 IMPLANT
LINE VENT (MISCELLANEOUS) ×4 IMPLANT
NS IRRIG 1000ML POUR BTL (IV SOLUTION) ×24 IMPLANT
PACK OPEN HEART (CUSTOM PROCEDURE TRAY) ×4 IMPLANT
PAD ARMBOARD 7.5X6 YLW CONV (MISCELLANEOUS) ×8 IMPLANT
POWDER SURGICEL 3.0 GRAM (HEMOSTASIS) ×4 IMPLANT
SEALANT SURG COSEAL 8ML (VASCULAR PRODUCTS) ×4 IMPLANT
SET CARDIOPLEGIA MPS 5001102 (MISCELLANEOUS) ×4 IMPLANT
SURGIFLO W/THROMBIN 8M KIT (HEMOSTASIS) ×4 IMPLANT
SUT BONE WAX W31G (SUTURE) ×4 IMPLANT
SUT ETHIBON 2 0 V 52N 30 (SUTURE) ×8 IMPLANT
SUT ETHIBON EXCEL 2-0 V-5 (SUTURE) ×4 IMPLANT
SUT ETHIBOND 2 0 SH (SUTURE) ×2
SUT ETHIBOND 2 0 SH 36X2 (SUTURE) ×2 IMPLANT
SUT ETHIBOND V-5 VALVE (SUTURE) ×4 IMPLANT
SUT PROLENE 3 0 RB 1 (SUTURE) ×8 IMPLANT
SUT PROLENE 3 0 SH 1 (SUTURE) IMPLANT
SUT PROLENE 3 0 SH DA (SUTURE) ×4 IMPLANT
SUT PROLENE 4 0 RB 1 (SUTURE) ×20
SUT PROLENE 4 0 SH DA (SUTURE) ×8 IMPLANT
SUT PROLENE 4-0 RB1 .5 CRCL 36 (SUTURE) ×20 IMPLANT
SUT STEEL 6MS V (SUTURE) ×4 IMPLANT
SUT STEEL SZ 6 DBL 3X14 BALL (SUTURE) ×4 IMPLANT
SUT VIC AB 1 CTX 36 (SUTURE) ×6
SUT VIC AB 1 CTX36XBRD ANBCTR (SUTURE) ×6 IMPLANT
SUT VIC AB 2-0 CTX 27 (SUTURE) IMPLANT
SUT VIC AB 3-0 X1 27 (SUTURE) IMPLANT
SYR 10ML KIT SKIN ADHESIVE (MISCELLANEOUS) IMPLANT
SYSTEM SAHARA CHEST DRAIN ATS (WOUND CARE) ×4 IMPLANT
TOWEL GREEN STERILE (TOWEL DISPOSABLE) ×4 IMPLANT
TOWEL GREEN STERILE FF (TOWEL DISPOSABLE) ×4 IMPLANT
TRAY FOLEY SILVER 16FR TEMP (SET/KITS/TRAYS/PACK) ×4 IMPLANT
TUBE CONNECTING 12'X1/4 (SUCTIONS) ×1
TUBE CONNECTING 12X1/4 (SUCTIONS) ×3 IMPLANT
UNDERPAD 30X30 (UNDERPADS AND DIAPERS) ×4 IMPLANT
VALVE AORTIC SZ25 INSP/RESIL (Prosthesis & Implant Heart) ×4 IMPLANT
VENT LEFT HEART 12002 (CATHETERS) ×8
WATER STERILE IRR 1000ML POUR (IV SOLUTION) ×8 IMPLANT
YANKAUER SUCT BULB TIP NO VENT (SUCTIONS) ×8 IMPLANT

## 2017-12-20 NOTE — Progress Notes (Signed)
      [address]       [place] [id number]             [phone]      S/p AVR  Intubated, starting to wake up  BP 118/70   Pulse 86   Temp 100.2 F (37.9 C)   Resp 14   SpO2 99%  Ci= 2.4   Intake/Output Summary (Last 24 hours) at 12/20/2017 1749 Last data filed at 12/20/2017 1700 Gross per 24 hour  Intake 4014.2 ml  Output 2450 ml  Net 1564.2 ml   Doing well early postop- wean to extubate  Viviann SpareSteven C. Dorris FetchHendrickson, MD [facility] [phone]

## 2017-12-20 NOTE — Anesthesia Procedure Notes (Signed)
Procedure Name: Intubation Date/Time: 12/20/2017 7:47 AM Performed by: Josie Dixon, CRNA Pre-anesthesia Checklist: Patient identified, Emergency Drugs available, Suction available and Patient being monitored Patient Re-evaluated:Patient Re-evaluated prior to induction Oxygen Delivery Method: Circle system utilized Preoxygenation: Pre-oxygenation with 100% oxygen Induction Type: IV induction Ventilation: Mask ventilation without difficulty and Oral airway inserted - appropriate to patient size Laryngoscope Size: Mac and 3 Grade View: Grade II Tube type: Subglottic suction tube Tube size: 7.5 mm Number of attempts: 1 Airway Equipment and Method: Stylet Placement Confirmation: ETT inserted through vocal cords under direct vision,  positive ETCO2 and breath sounds checked- equal and bilateral Secured at: 24 cm Tube secured with: Tape Dental Injury: Teeth and Oropharynx as per pre-operative assessment

## 2017-12-20 NOTE — Progress Notes (Signed)
Pre Procedure note for inpatients:   Stephen LivelyDavid E Mullins has been scheduled for Procedure(s): AORTIC VALVE REPLACEMENT (AVR) (N/A) TRANSESOPHAGEAL ECHOCARDIOGRAM (TEE) (N/A) today. The various methods of treatment have been discussed with the patient. After consideration of the risks, benefits and treatment options the patient has consented to the planned procedure.   The patient has been seen and labs reviewed. There are no changes in the patient's condition to prevent proceeding with the planned procedure today.  Recent labs:  Lab Results  Component Value Date   WBC 8.8 12/16/2017   HGB 16.0 12/16/2017   HCT 48.0 12/16/2017   PLT 239 12/16/2017   GLUCOSE 123 (H) 12/16/2017   ALT 26 12/16/2017   AST 26 12/16/2017   NA 139 12/16/2017   K 4.0 12/16/2017   CL 107 12/16/2017   CREATININE 0.88 12/16/2017   BUN 15 12/16/2017   CO2 19 (L) 12/16/2017   INR 1.03 12/16/2017   HGBA1C 5.8 (H) 12/16/2017    Stephen BussingPeter Van Trigt III, MD 12/20/2017 7:15 AM

## 2017-12-20 NOTE — Brief Op Note (Signed)
12/20/2017  10:28 AM  PATIENT:  Stephen Mullins  72 y.o. male  PRE-OPERATIVE DIAGNOSIS:  1.  SEVERE AS 2. MODERATE to SEVERE AI  POST-OPERATIVE DIAGNOSIS:  1.  SEVERE AS 2. MODERATE to SEVERE AI  PROCEDURE:  TRANSESOPHAGEAL ECHOCARDIOGRAM (TEE), MEDIAN STERNOTOMY for  AORTIC VALVE REPLACEMENT (AVR) (using a Pericardial tissue valve, Model 11500A, Serial # [id number], size 25)  SURGEON:  Surgeon(s) and Role:    Kerin PernaVan Trigt, Zyla Dascenzo, MD - Primary  PHYSICIAN ASSISTANT: Doree Fudgeonielle [name] PA-C  ASSISTANTS: Alyssa Hegarty RNFA  ANESTHESIA:   general  EBL:  400 cc  BLOOD ADMINISTERED  DRAINS: Chest tubes placed in the mediastinal and pleural spaces   SPECIMEN:  Source of Specimen:  Native aortic valve leaflets  DISPOSITION OF SPECIMEN:  PATHOLOGY  COUNTS CORRECT:  YES  DICTATION: .Dragon Dictation  PLAN OF CARE: Admit to inpatient   PATIENT DISPOSITION:  ICU - intubated and hemodynamically stable.   Delay start of Pharmacological VTE agent (>24hrs) due to surgical blood loss or risk of bleeding: yes  BASELINE WEIGHT: 90.6 kg

## 2017-12-20 NOTE — Plan of Care (Signed)
Promoting pulmonary toilet. Have dangled once, will dangle and stand in AM.

## 2017-12-20 NOTE — Transfer of Care (Signed)
Immediate Anesthesia Transfer of Care Note  Patient: Stephen LivelyDavid E Mullins  Procedure(s) Performed: AORTIC VALVE REPLACEMENT (AVR) (N/A Chest) TRANSESOPHAGEAL ECHOCARDIOGRAM (TEE) (N/A )  Patient Location: ICU  Anesthesia Type:General  Level of Consciousness: sedated  Airway & Oxygen Therapy: Patient remains intubated per anesthesia plan; pt placed on ventilator by RT.   Post-op Assessment: Report given to RN and Post -op Vital signs reviewed and stable  Post vital signs: Reviewed and stable  Last Vitals:  Vitals Value Taken Time  BP    Temp    Pulse 80 12/20/2017 12:08 PM  Resp 11 12/20/2017 12:08 PM  SpO2 97 % 12/20/2017 12:08 PM  Vitals shown include unvalidated device data.  Last Pain:  Vitals:   12/20/17 0558  TempSrc: Oral      Patients Stated Pain Goal: 4 (12/20/17 [id number])  Complications: No apparent anesthesia complications

## 2017-12-20 NOTE — Procedures (Signed)
Extubation Procedure Note  Patient Details:   Name: Stephen LivelyDavid E Mcmahen DOB: 1946/07/26 MRN: [id number]   Airway Documentation:     Evaluation  O2 sats: stable throughout Complications: No apparent complications Patient did tolerate procedure well. Bilateral Breath Sounds: Clear   Yes   Patient was extubated to a 4L [place] without any complications, dyspnea or stridor noted. Patient was instructed on IS x 5, highest goal achieved was 1,05200mL. NIF: -30, VC: 1.2L  Carmichael Burdette L 12/20/2017, 6:03 PM

## 2017-12-20 NOTE — Anesthesia Procedure Notes (Signed)
Arterial Line Insertion Start/End4/15/2019 6:45 AM, 12/20/2017 6:50 AM Performed by: Malachi Carlho, [place], CRNA, CRNA  Patient location: Pre-op. Preanesthetic checklist: patient identified, IV checked, site marked, risks and benefits discussed, surgical consent, monitors and equipment checked, pre-op evaluation and anesthesia consent Lidocaine 1% used for infiltration Left, radial was placed Catheter size: 20 G Hand hygiene performed , maximum sterile barriers used  and Seldinger technique used Allen's test indicative of satisfactory collateral circulation Attempts: 1 Procedure performed without using ultrasound guided technique. Ultrasound Notes:anatomy identified, needle tip was noted to be adjacent to the nerve/plexus identified and no ultrasound evidence of intravascular and/or intraneural injection Following insertion, dressing applied and Biopatch. Post procedure assessment: normal  Patient tolerated the procedure well with no immediate complications.

## 2017-12-20 NOTE — Op Note (Signed)
Stephen Mullins, AYRE NO.:  [id number]  MEDICAL RECORD NO.:  [id number]  LOCATION:  [facility]                         FACILITY:  [facility]  PHYSICIAN:  Kerin Perna, M.D.  DATE OF BIRTH:  12/03/1945  DATE OF PROCEDURE:  12/20/2017 DATE OF DISCHARGE:                              OPERATIVE REPORT   OPERATION:  Aortic valve replacement with a 25-mm Edwards pericardial tissue valve - Inspiris, serial [id number].  SURGEON:  Kerin Perna, M.D.  ASSISTANT:  Doree Fudge, PA-C.  PREOPERATIVE DIAGNOSES: 1. Severe aortic stenosis with moderate to severe aortic     insufficiency. 2. Bicuspid heavily calcified aortic valve. 3. Minimal ascending aortic dilatation measuring 4.3 cm.  POSTOPERATIVE DIAGNOSES: 1. Severe aortic stenosis with moderate to severe aortic     insufficiency. 2. Bicuspid heavily calcified aortic valve. 3. Minimal ascending aortic dilatation measuring 4.3 cm.  CLINICAL NOTE:  The patient is a 72 year old gentleman with known aortic stenosis from bicuspid valve, who has been followed carefully by Dr. Donato Schultz with serial echocardiograms.  His recent echo showed a significant increase in transvalvular gradient with a peak of 75 mmHg and measurements consistent with severe aortic stenosis.  He also had moderate-severe aortic insufficiency.  He had had some symptoms of shortness of breath carrying luggage up the stairway, but was able to tolerate his usual daily routine.  There were no symptoms of presyncope or palpitations.  Dr. Anne Fu had the patient evaluated with left and right heart catheterization, which showed no significant coronary artery disease and minimally elevated right-sided pressures.  Because of his severe aortic stenosis, he was felt to be a candidate for surgical revascularization and I saw the patient in consultation in the office. After reviewing his echocardiogram, right and left heart catheterization data, and examining  the patient, I agreed with his cardiologist's recommendation for aortic valve replacement for severe aortic stenosis.  I discussed the procedure of aortic valve replacement with the patient and family including the indications, benefits, alternatives, and risks. He understood that we use a tissue valve to avoid lifelong commitment to anticoagulation.  He understood the risks to include stroke, bleeding, blood transfusion, postoperative pulmonary problems including pleural effusion, postoperative infection, postoperative organ failure, and death.  He demonstrated his understanding and agreed to proceed with surgery under what I felt was an informed consent.  OPERATIVE FINDINGS: 1. Severely calcified and thickened bicuspid aortic valve. 2. Successful replacement with a 25-mm tissue pericardial valve with     normal transvalvular gradient and no significant AI.  Preserved LV     function by echo.  DESCRIPTION OF PROCEDURE:  The patient was brought to the operating room and placed supine on the operating table.  General anesthesia was induced under invasive hemodynamic monitoring.  The chest, abdomen, and legs were prepped with Betadine and draped as a sterile field.  A proper time-out was performed.  A sternal incision was made.  The sternum was retracted.  The pericardium was opened and suspended.  Pursestrings were placed in the ascending aorta and right atrium.  Heparin was administered.  The ascending aorta was mildly enlarged, but not a significant aneurysm that would be recommended  for resection- replacement.  The patient was cannulated and placed on bypass after the ACT was documented as being therapeutic.  A left ventricular vent was placed via the right superior pulmonary vein.  Cardioplegia cannulas were placed both antegrade and retrograde cold blood cardioplegia, and the patient was cooled to 32 degrees.  The aortic crossclamp was applied, and 1 L of cold blood cardioplegia  was delivered in split doses mainly by the retrograde catheter since the patient had significant aortic insufficiency.  There was good cardioplegic arrest, and septal temperature dropped less than 12 degrees.  Cardioplegia was delivered about every 20 minutes.  A transverse aortotomy was performed.  The aortic valve was inspected. It was heavily calcified, thickened, and bicuspid.  It was excised.  The annulus was debrided of heavy deposits of calcium.  The outflow tract was irrigated with cold saline.  The annulus was sized to a 25-mm Edwards Inspiris valve.  The subannular 2-0 pledgeted Ethibond sutures were placed around the anulus, numbering 17 total.  The valve was brought to the field and the sutures were placed through the sewing ring and the valve was seated and sutures were tied.  The valve conformed the annulus well without space for perivalvular leak.  There was no obstruction of the coronary ostia.  The aortotomy was closed in layers using two running 4-0 Prolenes and air were vented from the heart with a dose of retrograde warm blood cardioplegia in the usual de-airing maneuvers.  The crossclamp was removed.  The aorta was cardioverted back to a regular rhythm.  The patient was rewarmed.  The cardioplegia cannulas were removed.  The LV vent was removed.  Temporary pacing wires were applied.  The patient was rewarmed to normothermia and the lungs were expanded.  The patient was then started on low-dose milrinone and weaned off cardiopulmonary bypass. Echo showed preserved LV systolic function.  The tissue valve showed normal function.  Protamine was administered without adverse reaction. The cannulas were removed.  The superior pericardial fat was closed over the aorta.  Anterior mediastinal chest tube was placed and brought out through separate incisions.  The sternum was closed with wire.  The patient remained stable.  The pectoralis fascia was closed with a running #1  Vicryl.  The subcutaneous and skin layers were closed using running Vicryl and sterile dressings were applied.  Total cardiopulmonary bypass time was 100 minutes.     Kerin PernaPeter Van Trigt, M.D.     PV/MEDQ  D:  12/20/2017  T:  12/20/2017  Job:  [id number]  cc:   Jake BatheMark C Skains, MD

## 2017-12-20 NOTE — Anesthesia Procedure Notes (Signed)
Central Venous Catheter Insertion Performed by: Annye Asa, MD, anesthesiologist Start/End4/15/2019 6:46 AM, 12/20/2017 7:02 AM Patient location: Pre-op. Preanesthetic checklist: patient identified, IV checked, risks and benefits discussed, surgical consent, monitors and equipment checked, pre-op evaluation, timeout performed and anesthesia consent Position: supine Lidocaine 1% used for infiltration and patient sedated Hand hygiene performed , maximum sterile barriers used  and Seldinger technique used Catheter size: 8.5 Fr PA cath was placed.Sheath introducer Swan type:thermodilution Procedure performed using ultrasound guided technique. Ultrasound Notes:anatomy identified, needle tip was noted to be adjacent to the nerve/plexus identified, no ultrasound evidence of intravascular and/or intraneural injection and image(s) printed for medical record Attempts: 1 Following insertion, line sutured, dressing applied and Biopatch. Post procedure assessment: blood return through all ports, free fluid flow and no air  Patient tolerated the procedure well with no immediate complications. Additional procedure comments: PA catheter:  Routine monitors. Timeout, sterile prep, drape, FBP R neck.  Trendelenburg position.  1% Lido local, finder and trocar RIJ 1st pass with US guidance.  Cordis placed over J wire. PA catheter in easily.  Sterile dressing applied.  Patient tolerated well, VSS.  Jenita Seashore, MD.

## 2017-12-20 NOTE — Progress Notes (Signed)
  Echocardiogram Echocardiogram Transesophageal has been performed.  Stephen Mullins Stephen Mullins 12/20/2017, 10:02 AM

## 2017-12-21 ENCOUNTER — Inpatient Hospital Stay (HOSPITAL_COMMUNITY): Payer: Medicare HMO

## 2017-12-21 ENCOUNTER — Encounter (HOSPITAL_COMMUNITY): Payer: Self-pay | Admitting: Cardiothoracic Surgery

## 2017-12-21 ENCOUNTER — Other Ambulatory Visit: Payer: Self-pay

## 2017-12-21 LAB — GLUCOSE, CAPILLARY
Glucose-Capillary: 102 mg/dL — ABNORMAL HIGH (ref 65–99)
Glucose-Capillary: 106 mg/dL — ABNORMAL HIGH (ref 65–99)
Glucose-Capillary: 107 mg/dL — ABNORMAL HIGH (ref 65–99)
Glucose-Capillary: 114 mg/dL — ABNORMAL HIGH (ref 65–99)
Glucose-Capillary: 115 mg/dL — ABNORMAL HIGH (ref 65–99)
Glucose-Capillary: 118 mg/dL — ABNORMAL HIGH (ref 65–99)
Glucose-Capillary: 119 mg/dL — ABNORMAL HIGH (ref 65–99)
Glucose-Capillary: 119 mg/dL — ABNORMAL HIGH (ref 65–99)
Glucose-Capillary: 122 mg/dL — ABNORMAL HIGH (ref 65–99)
Glucose-Capillary: 124 mg/dL — ABNORMAL HIGH (ref 65–99)
Glucose-Capillary: 126 mg/dL — ABNORMAL HIGH (ref 65–99)
Glucose-Capillary: 134 mg/dL — ABNORMAL HIGH (ref 65–99)
Glucose-Capillary: 135 mg/dL — ABNORMAL HIGH (ref 65–99)
Glucose-Capillary: 140 mg/dL — ABNORMAL HIGH (ref 65–99)
Glucose-Capillary: 144 mg/dL — ABNORMAL HIGH (ref 65–99)
Glucose-Capillary: 150 mg/dL — ABNORMAL HIGH (ref 65–99)
Glucose-Capillary: 98 mg/dL (ref 65–99)
Glucose-Capillary: 99 mg/dL (ref 65–99)

## 2017-12-21 LAB — CREATININE, SERUM: CREATININE: 0.89 mg/dL (ref 0.61–1.24)

## 2017-12-21 LAB — POCT I-STAT, CHEM 8
BUN: 12 mg/dL (ref 6–20)
Calcium, Ion: 1.21 mmol/L (ref 1.15–1.40)
Chloride: 103 mmol/L (ref 101–111)
Creatinine, Ser: 0.8 mg/dL (ref 0.61–1.24)
Glucose, Bld: 156 mg/dL — ABNORMAL HIGH (ref 65–99)
HCT: 38 % — ABNORMAL LOW (ref 39.0–52.0)
Hemoglobin: 12.9 g/dL — ABNORMAL LOW (ref 13.0–17.0)
Potassium: 4.2 mmol/L (ref 3.5–5.1)
Sodium: 137 mmol/L (ref 135–145)
TCO2: 23 mmol/L (ref 22–32)

## 2017-12-21 LAB — CBC
HEMATOCRIT: 34.7 % — AB (ref 39.0–52.0)
HEMATOCRIT: 40.3 % (ref 39.0–52.0)
HEMOGLOBIN: 13.7 g/dL (ref 13.0–17.0)
Hemoglobin: 11.4 g/dL — ABNORMAL LOW (ref 13.0–17.0)
MCH: 29.2 pg (ref 26.0–34.0)
MCH: 30.8 pg (ref 26.0–34.0)
MCHC: 32.9 g/dL (ref 30.0–36.0)
MCHC: 34 g/dL (ref 30.0–36.0)
MCV: 89 fL (ref 78.0–100.0)
MCV: 90.6 fL (ref 78.0–100.0)
Platelets: 160 10*3/uL (ref 150–400)
Platelets: 166 10*3/uL (ref 150–400)
RBC: 3.9 MIL/uL — AB (ref 4.22–5.81)
RBC: 4.45 MIL/uL (ref 4.22–5.81)
RDW: 13.3 % (ref 11.5–15.5)
RDW: 13.8 % (ref 11.5–15.5)
WBC: 11.6 10*3/uL — ABNORMAL HIGH (ref 4.0–10.5)
WBC: 14.8 10*3/uL — AB (ref 4.0–10.5)

## 2017-12-21 LAB — BASIC METABOLIC PANEL
ANION GAP: 7 (ref 5–15)
BUN: 11 mg/dL (ref 6–20)
CHLORIDE: 109 mmol/L (ref 101–111)
CO2: 24 mmol/L (ref 22–32)
Calcium: 8 mg/dL — ABNORMAL LOW (ref 8.9–10.3)
Creatinine, Ser: 0.83 mg/dL (ref 0.61–1.24)
GFR calc non Af Amer: >60 mL/min (ref >60)
Glucose, Bld: 101 mg/dL — ABNORMAL HIGH (ref 65–99)
POTASSIUM: 3.7 mmol/L (ref 3.5–5.1)
SODIUM: 140 mmol/L (ref 135–145)

## 2017-12-21 LAB — MAGNESIUM
MAGNESIUM: 2.4 mg/dL (ref 1.7–2.4)
MAGNESIUM: 2.1 mg/dL (ref 1.7–2.4)

## 2017-12-21 IMAGING — DX DG CHEST 1V PORT
1 series · 1 of 1 positions shown · non-contrast
Comparison: [DATE]

CLINICAL DATA: Follow-up aortic valve repair

EXAM:
PORTABLE CHEST 1 VIEW

[chest ap]
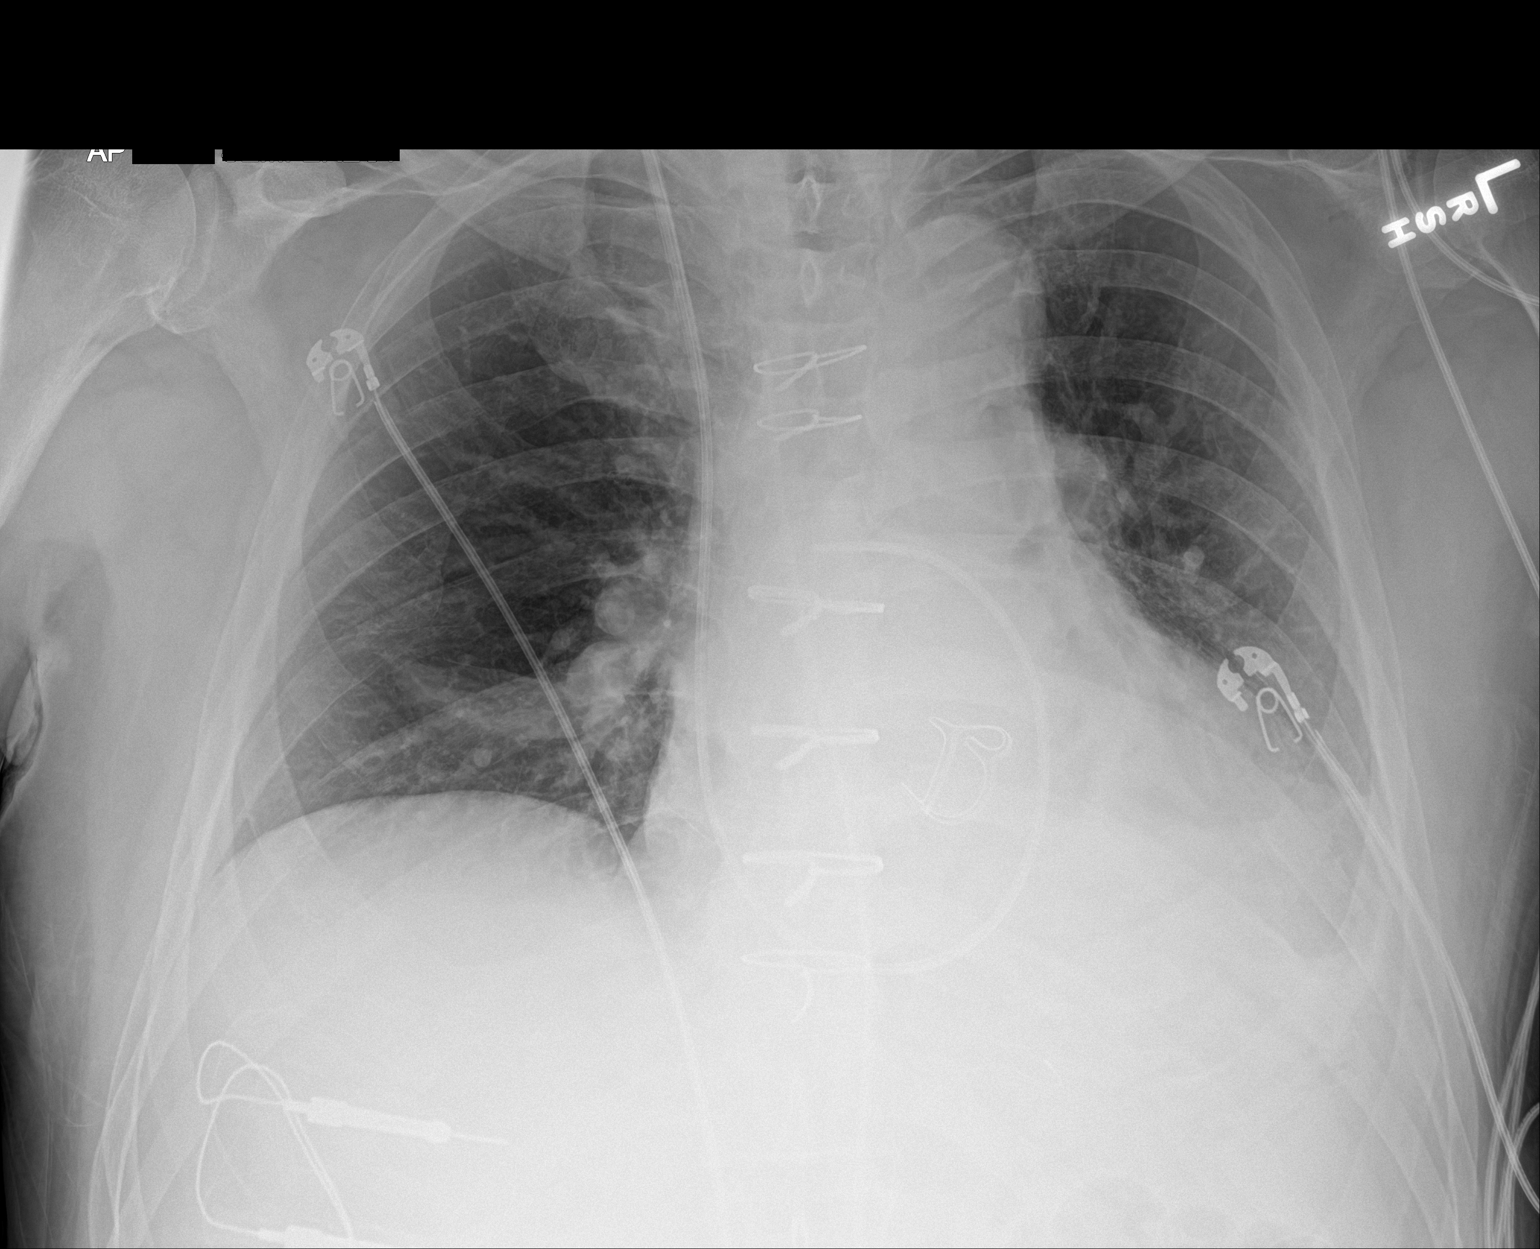

[1 of 1 positions shown; findings below may reference images not displayed]

FINDINGS: Interval removal of endotracheal tube and NG tube. Swan-Ganz
catheter remains in the central right pulmonary artery. Changes of
aortic valve replacement. No pneumothorax. Cardiomegaly with
vascular congestion and bibasilar atelectasis. Small left effusion.
IMPRESSION: Interval extubation.  No pneumothorax.

Bibasilar atelectasis.

Small left effusion.

Cardiomegaly, vascular congestion.

## 2017-12-21 MED ORDER — POTASSIUM CHLORIDE 10 MEQ/50ML IV SOLN
10.0000 meq | INTRAVENOUS | Status: AC
  Administered 2017-12-21 (×5): 10 meq via INTRAVENOUS
  Filled 2017-12-21 (×4): qty 50

## 2017-12-21 MED ORDER — INSULIN DETEMIR 100 UNIT/ML ~~LOC~~ SOLN
18.0000 [IU] | Freq: Two times a day (BID) | SUBCUTANEOUS | Status: DC
  Filled 2017-12-21: qty 0.18

## 2017-12-21 MED ORDER — INSULIN DETEMIR 100 UNIT/ML ~~LOC~~ SOLN
10.0000 [IU] | Freq: Two times a day (BID) | SUBCUTANEOUS | Status: DC
Start: 2017-12-21 — End: 2017-12-23
  Administered 2017-12-21 – 2017-12-22 (×4): 10 [IU] via SUBCUTANEOUS
  Filled 2017-12-21 (×5): qty 0.1

## 2017-12-21 MED ORDER — POTASSIUM CHLORIDE 10 MEQ/50ML IV SOLN: 10.0000 meq | INTRAVENOUS | Status: DC

## 2017-12-21 MED ORDER — POTASSIUM CHLORIDE CRYS ER 20 MEQ PO TBCR
20.0000 meq | EXTENDED_RELEASE_TABLET | Freq: Two times a day (BID) | ORAL | Status: DC
  Administered 2017-12-21 – 2017-12-25 (×9): 20 meq via ORAL
  Filled 2017-12-21 (×9): qty 1

## 2017-12-21 MED ORDER — POTASSIUM CHLORIDE 10 MEQ/50ML IV SOLN: 10.0000 meq | INTRAVENOUS | Status: AC

## 2017-12-21 MED ORDER — CYCLOBENZAPRINE HCL 10 MG PO TABS
5.0000 mg | ORAL_TABLET | Freq: Two times a day (BID) | ORAL | Status: DC
  Administered 2017-12-21 (×2): 5 mg via ORAL
  Filled 2017-12-21 (×2): qty 1

## 2017-12-21 MED ORDER — FUROSEMIDE 10 MG/ML IJ SOLN
20.0000 mg | Freq: Two times a day (BID) | INTRAMUSCULAR | Status: DC
  Administered 2017-12-21 – 2017-12-22 (×4): 20 mg via INTRAVENOUS
  Filled 2017-12-21 (×4): qty 2

## 2017-12-21 MED ORDER — INSULIN ASPART 100 UNIT/ML ~~LOC~~ SOLN
0.0000 [IU] | SUBCUTANEOUS | Status: DC
  Administered 2017-12-21 – 2017-12-22 (×4): 2 [IU] via SUBCUTANEOUS

## 2017-12-21 MED FILL — Electrolyte-R (PH 7.4) Solution: INTRAVENOUS | Qty: 3000 | Status: AC

## 2017-12-21 MED FILL — Sodium Bicarbonate IV Soln 8.4%: INTRAVENOUS | Qty: 50 | Status: AC

## 2017-12-21 MED FILL — Sodium Chloride IV Soln 0.9%: INTRAVENOUS | Qty: 2000 | Status: AC

## 2017-12-21 MED FILL — Potassium Chloride Inj 2 mEq/ML: INTRAVENOUS | Qty: 20 | Status: AC

## 2017-12-21 MED FILL — Mannitol IV Soln 20%: INTRAVENOUS | Qty: 500 | Status: AC

## 2017-12-21 MED FILL — Magnesium Sulfate Inj 50%: INTRAMUSCULAR | Qty: 10 | Status: AC

## 2017-12-21 MED FILL — Heparin Sodium (Porcine) Inj 1000 Unit/ML: INTRAMUSCULAR | Qty: 30 | Status: AC

## 2017-12-21 MED FILL — Lidocaine HCl IV Inj 20 MG/ML: INTRAVENOUS | Qty: 10 | Status: AC

## 2017-12-21 MED FILL — Heparin Sodium (Porcine) Inj 1000 Unit/ML: INTRAMUSCULAR | Qty: 2500 | Status: AC

## 2017-12-21 NOTE — Progress Notes (Addendum)
[facility] DAILY ICU PROGRESS NOTE                   [address]            [place], [id number]          [phone]   1 Day Post-Op Procedure(s) (LRB): AORTIC VALVE REPLACEMENT (AVR) (N/A) TRANSESOPHAGEAL ECHOCARDIOGRAM (TEE) (N/A)  Total Length of Stay:  LOS: 1 day   Subjective: Patient awake, alert. He has incisional pain and cramping of his lower back and calves.  Objective: Vital signs in last 24 hours: Temp:  [95.5 F (35.3 C)-101.7 F (38.7 C)] 98.6 F (37 C) (04/16 0700) Pulse Rate:  [80-88] 88 (04/16 0700) Cardiac Rhythm: Atrial paced (04/16 0400) Resp:  [11-32] 14 (04/16 0700) BP: (74-169)/(51-158) 112/67 (04/16 0700) SpO2:  [94 %-100 %] 97 % (04/16 0700) Arterial Line BP: (77-208)/(50-104) 114/59 (04/16 0700) FiO2 (%):  [40 %-50 %] 40 % (04/15 1740) Weight:  [209 lb 10.5 oz (95.1 kg)] 209 lb 10.5 oz (95.1 kg) (04/16 0600)  Filed Weights   12/21/17 0500 12/21/17 0600  Weight: 209 lb 10.5 oz (95.1 kg) 209 lb 10.5 oz (95.1 kg)    Weight change:    Hemodynamic parameters for last 24 hours: PAP: (22-54)/(11-27) 36/18 CO:  [4.3 L/min-6.8 L/min] 6.6 L/min CI:  [2 L/min/m2-3.2 L/min/m2] 3.1 L/min/m2  Intake/Output from previous day: 04/15 0701 - 04/16 0700 In: 5577.2 [P.O.:90; I.V.:3437.2; Blood:250; IV Piggyback:1800] Out: 3290 [Urine:2490; Blood:400; Chest Tube:400]  Intake/Output this shift: No intake/output data recorded.  Current Meds: Scheduled Meds: . acetaminophen  1,000 mg Oral Q6H   Or  . acetaminophen (TYLENOL) oral liquid 160 mg/5 mL  1,000 mg Per Tube Q6H  . aspirin EC  325 mg Oral Daily   Or  . aspirin  324 mg Per Tube Daily  . bisacodyl  10 mg Oral Daily   Or  . bisacodyl  10 mg Rectal Daily  . Chlorhexidine Gluconate Cloth  6 each Topical Daily  . docusate sodium  200 mg Oral Daily  . insulin regular  0-10 Units Intravenous TID WC  . mouth rinse  15 mL Mouth Rinse BID  . metoCLOPramide (REGLAN) injection  10 mg  Intravenous Q6H  . metoprolol tartrate  12.5 mg Oral BID   Or  . metoprolol tartrate  12.5 mg Per Tube BID  . [START ON 12/22/2017] pantoprazole  40 mg Oral Daily  . sodium chloride flush  10-40 mL Intracatheter Q12H  . sodium chloride flush  3 mL Intravenous Q12H   Continuous Infusions: . sodium chloride 20 mL/hr at 12/21/17 0700  . sodium chloride 1 mL/hr at 12/21/17 0700  . sodium chloride Stopped (12/20/17 2300)  . albumin human    .  ceFAZolin (ANCEF) IV Stopped (12/21/17 [id number])  . dexmedetomidine (PRECEDEX) IV infusion Stopped (12/21/17 0541)  . famotidine (PEPCID) IV Stopped (12/20/17 1318)  . insulin (NOVOLIN-R) infusion 1.4 Units/hr (12/21/17 0636)  . lactated ringers    . lactated ringers 20 mL/hr at 12/21/17 0700  . lactated ringers 20 mL/hr at 12/21/17 0700  . milrinone 0.3 mcg/kg/min (12/21/17 0700)  . nitroGLYCERIN 0 mcg/min (12/20/17 1205)  . phenylephrine (NEO-SYNEPHRINE) Adult infusion 25.067 mcg/min (12/21/17 0700)  . vancomycin Stopped (12/20/17 1355)   PRN Meds:.sodium chloride, albumin human, lactated ringers, metoprolol tartrate, midazolam, morphine injection, ondansetron (ZOFRAN) IV, oxyCODONE, sodium chloride flush, sodium chloride flush, traMADol  General appearance: alert, cooperative and no distress Neurologic: intact Heart:  RRR, no murmur, rub with chest tubes in place Lungs: Slighlty diminshed at bases Abdomen: Soft, non tender, bowel sounds present Extremities: SCDs intact Wound: Aquacel intact  Lab Results: CBC: Recent Labs    12/20/17 1759 12/20/17 1808 12/21/17 0434  WBC 11.4*  --  11.6*  HGB 12.3* 11.6* 11.4*  HCT 36.7* 34.0* 34.7*  PLT 159  --  160   BMET:  Recent Labs    12/20/17 1808 12/21/17 0434  NA 142 140  K 3.9 3.7  CL 107 109  CO2  --  24  GLUCOSE 96 101*  BUN 12 11  CREATININE 0.80 0.83  CALCIUM  --  8.0*    CMET: Lab Results  Component Value Date   WBC 11.6 (H) 12/21/2017   HGB 11.4 (L) 12/21/2017   HCT 34.7  (L) 12/21/2017   PLT 160 12/21/2017   GLUCOSE 101 (H) 12/21/2017   ALT 26 12/16/2017   AST 26 12/16/2017   NA 140 12/21/2017   K 3.7 12/21/2017   CL 109 12/21/2017   CREATININE 0.83 12/21/2017   BUN 11 12/21/2017   CO2 24 12/21/2017   INR 1.49 12/20/2017   HGBA1C 5.8 (H) 12/16/2017    PT/INR:  Recent Labs    12/20/17 1224  LABPROT 17.9*  INR 1.49   Radiology: Dg Chest Port 1 View  Result Date: 12/20/2017 CLINICAL DATA:  Status post aortic valve replacement. EXAM: PORTABLE CHEST 1 VIEW COMPARISON:  12/16/2017. FINDINGS: Cardiomegaly. Median sternotomy, for AVR. ET tube 3.4 cm above carina, satisfactory. Swan-Ganz catheter tip RIGHT ventricular outflow tract. Mediastinal tube good position. Enteric tube appears to lie in the stomach. Bibasilar subsegmental atelectasis without consolidation or edema. RIGHT pleural effusion. No pneumothorax. IMPRESSION: Satisfactory postoperative appearance following AVR. Electronically Signed   By: Elsie Stain M.D.   On: 12/20/2017 12:44     Assessment/Plan: S/P Procedure(s) (LRB): AORTIC VALVE REPLACEMENT (AVR) (N/A) TRANSESOPHAGEAL ECHOCARDIOGRAM (TEE) (N/A)  1. CV-A paced. On Milrinone and Neo Synephrine drips as well as Lopressor 12.5 mg bid. CO/CI 7.7/3.6. EKG showed SB,PACs. Will wean drips. 2. Pulmonary-On 3 liters of oxygen via . Will wean ro room air over next few days. Chest tubes with 400 cc since surgery. CXR this am appears to show no pneumothorax, left base atelectasis and small pleural effusion. Encourage incentive spirometer. 3. Volume overload-will give low dose Lasix once off Neo 4. ABL anemia-H and H 11.4 and 34.7 5. Supplement potassium 6. CBGs 98/106/140. Pre op HGA1C 5.8. 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 He will need further surveillance with medical doctor after discharge. He will be provided diet recommendations with discharge paperwork. 7. Regarding mucsle cramps, as discussed with Dr. [name], Flexeril bid and keep  potassium supplemented accordingly. 8. Please see progression orders    Donielle Margaretann Loveless 12/21/2017 7:45 AM   patient examined and medical record reviewed,agree with above note. Kathlee Nations Trigt III 12/21/2017

## 2017-12-21 NOTE — Progress Notes (Signed)
[facility] BRIEF SICU PROGRESS NOTE  1 Day Post-Op  S/P Procedure(s) (LRB): AORTIC VALVE REPLACEMENT (AVR) (N/A) TRANSESOPHAGEAL ECHOCARDIOGRAM (TEE) (N/A)   Stable day NSR w/ stable BP on milrinone and Neo Breathing comfortably w/ O2 sats 93-95% Diuresing some Labs okay  Plan: Continue current plan  Purcell Nailslarence H Owen, MD 12/21/2017 8:22 PM

## 2017-12-22 ENCOUNTER — Inpatient Hospital Stay (HOSPITAL_COMMUNITY): Payer: Medicare HMO

## 2017-12-22 LAB — CBC
HCT: 39.1 % (ref 39.0–52.0)
Hemoglobin: 13.1 g/dL (ref 13.0–17.0)
MCH: 30.3 pg (ref 26.0–34.0)
MCHC: 33.5 g/dL (ref 30.0–36.0)
MCV: 90.5 fL (ref 78.0–100.0)
Platelets: 164 10*3/uL (ref 150–400)
RBC: 4.32 MIL/uL (ref 4.22–5.81)
RDW: 13.4 % (ref 11.5–15.5)
WBC: 15.9 10*3/uL — ABNORMAL HIGH (ref 4.0–10.5)

## 2017-12-22 LAB — TYPE AND SCREEN
ABO/RH(D): O POS
Antibody Screen: NEGATIVE
Unit division: 0
Unit division: 0

## 2017-12-22 LAB — BPAM RBC
Blood Product Expiration Date: 201905082359
Blood Product Expiration Date: 201905082359
Unit Type and Rh: 5100
Unit Type and Rh: 5100

## 2017-12-22 LAB — BASIC METABOLIC PANEL
Anion gap: 9 (ref 5–15)
BUN: 13 mg/dL (ref 6–20)
CO2: 23 mmol/L (ref 22–32)
Calcium: 8.9 mg/dL (ref 8.9–10.3)
Chloride: 104 mmol/L (ref 101–111)
Creatinine, Ser: 0.93 mg/dL (ref 0.61–1.24)
GFR calc Af Amer: >60 mL/min (ref >60)
GFR calc non Af Amer: >60 mL/min (ref >60)
Glucose, Bld: 137 mg/dL — ABNORMAL HIGH (ref 65–99)
Potassium: 4.3 mmol/L (ref 3.5–5.1)
Sodium: 136 mmol/L (ref 135–145)

## 2017-12-22 LAB — POCT I-STAT, CHEM 8
BUN: 18 mg/dL (ref 6–20)
Calcium, Ion: 1.2 mmol/L (ref 1.15–1.40)
Chloride: 100 mmol/L — ABNORMAL LOW (ref 101–111)
Creatinine, Ser: 0.9 mg/dL (ref 0.61–1.24)
Glucose, Bld: 132 mg/dL — ABNORMAL HIGH (ref 65–99)
HCT: 39 % (ref 39.0–52.0)
Hemoglobin: 13.3 g/dL (ref 13.0–17.0)
Potassium: 4 mmol/L (ref 3.5–5.1)
Sodium: 137 mmol/L (ref 135–145)
TCO2: 26 mmol/L (ref 22–32)

## 2017-12-22 LAB — GLUCOSE, CAPILLARY
Glucose-Capillary: 114 mg/dL — ABNORMAL HIGH (ref 65–99)
Glucose-Capillary: 126 mg/dL — ABNORMAL HIGH (ref 65–99)
Glucose-Capillary: 131 mg/dL — ABNORMAL HIGH (ref 65–99)
Glucose-Capillary: 133 mg/dL — ABNORMAL HIGH (ref 65–99)

## 2017-12-22 IMAGING — DX DG CHEST 1V PORT
1 series · 1 of 1 positions shown · non-contrast
Comparison: Yesterday

CLINICAL DATA: Sore chest after aortic valve replacement.

EXAM:
PORTABLE CHEST 1 VIEW

[chest ap]
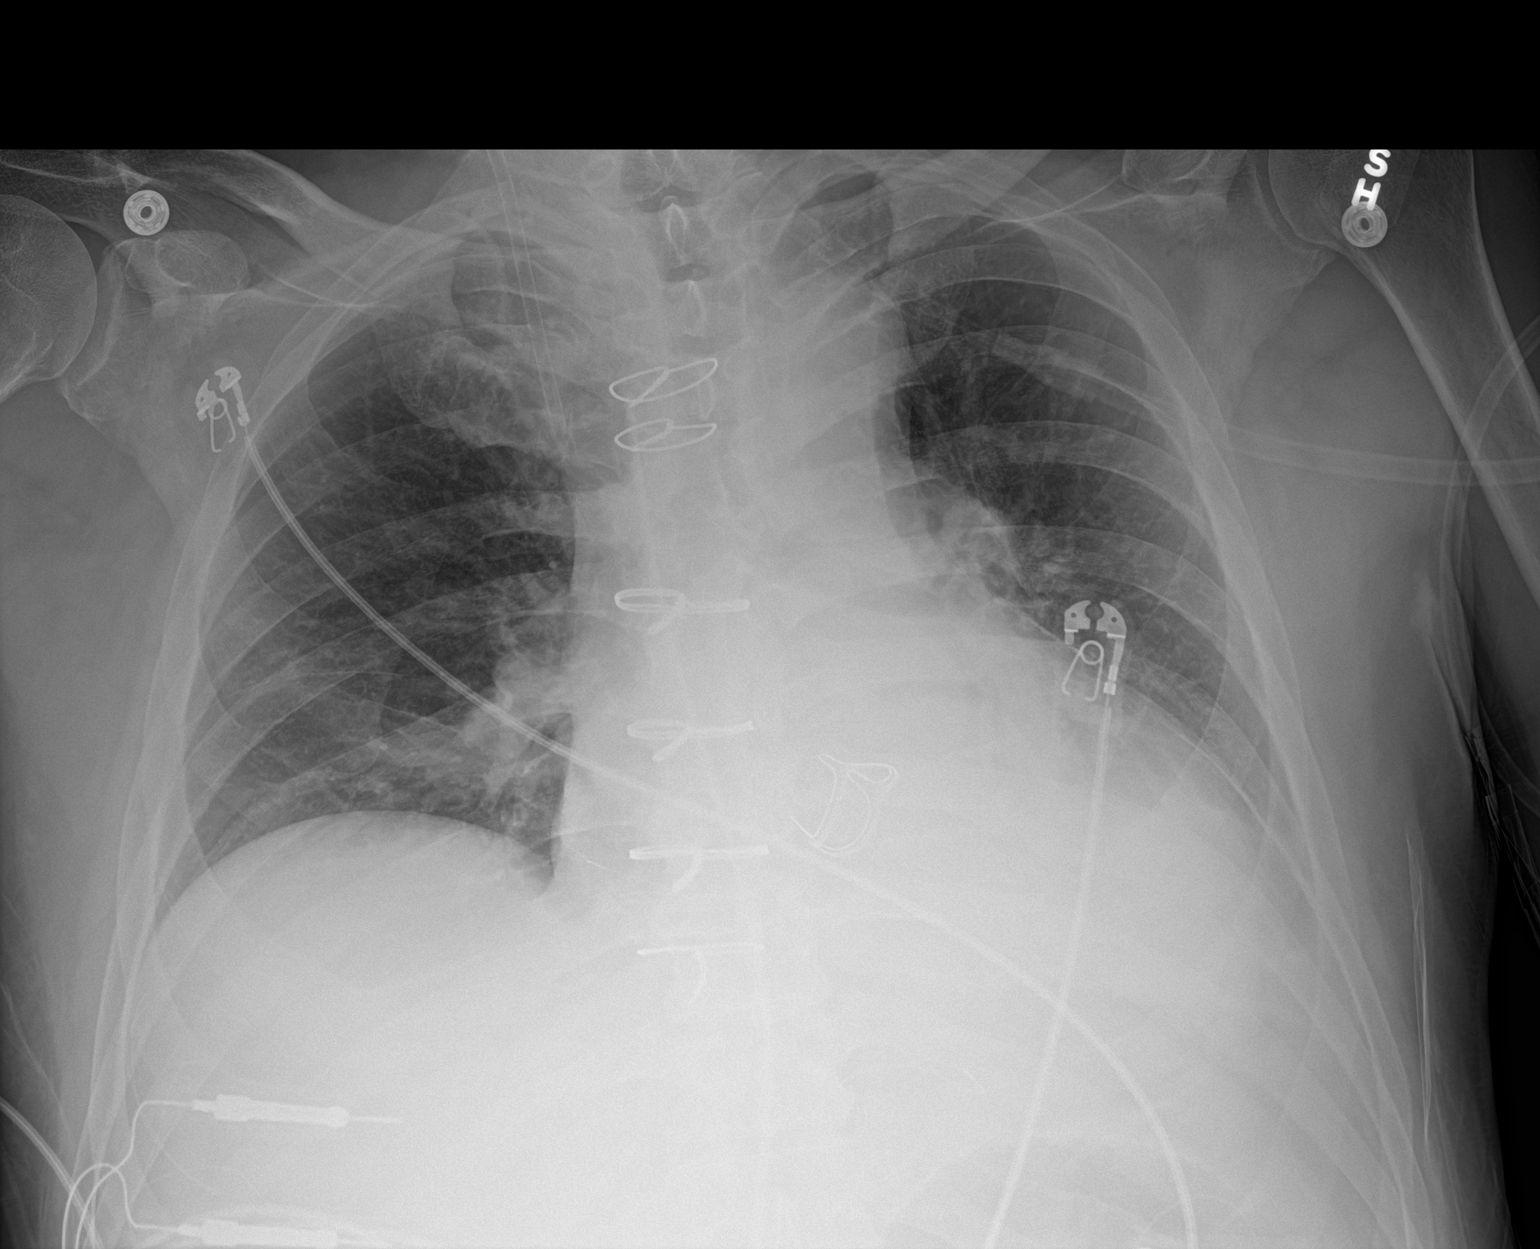

[1 of 1 positions shown; findings below may reference images not displayed]

FINDINGS: A Swan-Ganz catheter has been removed. Right IJ sheath remains.
Mediastinal drain remains.

Stable cardiopericardial enlargement. Aortic valve replacement. Low
volume chest with streaky densities at the bases. No visible
pneumothorax.
IMPRESSION: Stable low volumes and atelectasis. Stable cardiopericardial
enlargement.

## 2017-12-22 MED ORDER — HYDRALAZINE HCL 20 MG/ML IJ SOLN: 10.0000 mg | INTRAMUSCULAR | Status: DC | PRN

## 2017-12-22 MED ORDER — BISACODYL 10 MG RE SUPP
10.0000 mg | Freq: Once | RECTAL | Status: AC
  Administered 2017-12-22: 10 mg via RECTAL
  Filled 2017-12-22: qty 1

## 2017-12-22 MED ORDER — METOPROLOL TARTRATE 12.5 MG HALF TABLET
12.5000 mg | ORAL_TABLET | Freq: Two times a day (BID) | ORAL | Status: DC
Start: 2017-12-22 — End: 2017-12-23
  Administered 2017-12-22 – 2017-12-23 (×3): 12.5 mg via ORAL
  Filled 2017-12-22 (×3): qty 1

## 2017-12-22 MED ORDER — AMIODARONE HCL IN DEXTROSE 360-4.14 MG/200ML-% IV SOLN
60.0000 mg/h | INTRAVENOUS | Status: DC
  Filled 2017-12-22: qty 200

## 2017-12-22 MED ORDER — SORBITOL 70 % PO SOLN
30.0000 mL | Freq: Once | ORAL | Status: AC
  Administered 2017-12-22: 30 mL via ORAL
  Filled 2017-12-22 (×2): qty 30

## 2017-12-22 MED ORDER — AMIODARONE LOAD VIA INFUSION
150.0000 mg | Freq: Once | INTRAVENOUS | Status: DC
  Filled 2017-12-22: qty 83.34

## 2017-12-22 MED ORDER — AMIODARONE HCL IN DEXTROSE 360-4.14 MG/200ML-% IV SOLN: 30.0000 mg/h | INTRAVENOUS | Status: DC

## 2017-12-22 MED ORDER — KETOROLAC TROMETHAMINE 15 MG/ML IJ SOLN
15.0000 mg | Freq: Four times a day (QID) | INTRAMUSCULAR | Status: DC
  Administered 2017-12-22 (×2): 15 mg via INTRAVENOUS
  Filled 2017-12-22 (×2): qty 1

## 2017-12-22 MED ORDER — FUROSEMIDE 10 MG/ML IJ SOLN
40.0000 mg | Freq: Two times a day (BID) | INTRAMUSCULAR | Status: DC
  Administered 2017-12-23 – 2017-12-25 (×5): 40 mg via INTRAVENOUS
  Filled 2017-12-22 (×5): qty 4

## 2017-12-22 NOTE — Discharge Summary (Signed)
Physician Discharge Summary       [address]       [place] [address]             [id number]    Patient ID: Stephen Mullins MRN: [id number] DOB/AGE: 1946/08/08 72 y.o.  Admit date: 12/20/2017 Discharge date: 12/25/2017  Admission Diagnoses: 1. Severe aortic stenosis with moderate to severe aortic     insufficiency. 2. Bicuspid heavily calcified aortic valve.   Discharge Diagnoses:  1. S/P aortic valve replacement with bioprosthetic valve 2. Post op atrial fibrillation 3. History of hypertension 4. History of borderline hyperlipidemia 5. History of pre diabetes 6. 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    Procedure (s):  Aortic valve replacement with a 25-mm Edwards pericardial tissue valve - Inspiris, serial [id number] by Dr. [name] 12/20/2017.  History of Presenting Illness: 72 year old retired male ex-smoker with long history of cardiac murmur.  The patient has been followed by Dr. Anne Fu for aortic stenosis which by echo has been moderate in severity.  However on the most recent echo his gradient indicated severe aortic stenosis with a valve area of 0.7, peak gradient 75 mmHg, mean gradient 42 mmHg.  There is also moderate-severe aortic insufficiency.  Patient had severe LVH.  Although the patient today denies any symptoms of chest pain shortness of breath on exertion or presyncope, in the past he has related symptoms of shortness of breath while [place].  The patient does aerobic activity at the fitness center at least 3 days a week and also plays some doubles racquetball with his wife.  No family history of heart valve surgery or aortic stenosis.  No history of rheumatic fever or scarlet fever as a child.  No recent dental complaints.  He gets his teeth cleaned every 6 months. He is recently retired from [employer].  The patient had normal coronaries on coronary  angiogram.  Right heart cath pressures were normal cardiac index was 2.1. Patient would benefit from aortic valve replacement with a biologic AVR for improved survival, preservation of LV function and to prevent heart failure.  Dr. [name] discussed the procedure of AVR in detail with the patient is wife including indications, benefits, details of surgery, and risks. He presented to [facility] on 12/20/2017 in order to undergo an aortic valve replacement.  Brief Hospital Course:  The patient was extubated the evening of surgery without difficulty. He was initially AAI paced. He remained afebrile and hemodynamically stable. Theone Murdoch, a line, chest tubes, and foley were  removed early in the post operative course. Lopressor was started and titrated accordingly. He went into atrial fibrillation on 12/22/2017. He initially converted to SR prior to be given Amiodarone;however, he went then was PAF. He was put on oral Amiodarone. He will be started on Eliquis at time of discharge He was volume over loaded and diuresed. He had ABL anemia. He did not require a post op transfusion. Last H and H was 11 and 34.4. He was weaned off the insulin drip.  The patient's HGA1C pre op was 5.8. The patient was felt surgically stable for transfer from the ICU to [facility]U for further convalescence on 12/23/2017. He continues to progress with cardiac rehab. He was ambulating on room air. He has been tolerating a diet and has had a bowel movement. Epicardial pacing wires were removed on 12/25/2017. Chest tube sutures will be removed  the day of discharge. The patient is felt surgically stable for discharge today.   Latest Vital Signs: Blood pressure (!) 150/80, pulse (!) 59, temperature 97.9 F (36.6 C), temperature source Oral, resp. rate (!) 27, height 5' 11.5" (1.816 m), weight 197 lb 12.8 oz (89.7 kg), SpO2 92 %.  Physical Exam: Cardiovascular: RRR, flow murmur Pulmonary: Clear to auscultation bilaterally Abdomen:  Soft, non tender, bowel sounds present. Extremities: Mild bilateral lower extremity edema. Wounds: Clean and dry.  No erythema or signs of infection.  Discharge Condition:Stable and discharged to home.  Recent laboratory studies:  Lab Results  Component Value Date   WBC 7.5 12/25/2017   HGB 11.6 (L) 12/25/2017   HCT 35.9 (L) 12/25/2017   MCV 90.4 12/25/2017   PLT 242 12/25/2017   Lab Results  Component Value Date   NA 139 12/25/2017   K 3.5 12/25/2017   CL 102 12/25/2017   CO2 28 12/25/2017   CREATININE 0.95 12/25/2017   GLUCOSE 126 (H) 12/25/2017    Diagnostic Studies:  Dg Chest Port 1 View  Result Date: 12/24/2017 CLINICAL DATA:  Aortic valve replacement EXAM: PORTABLE CHEST 1 VIEW COMPARISON:  12/23/2017 FINDINGS: Right jugular sheath in the right innominate vein unchanged. No pneumothorax. Cardiac enlargement. Aortic valve replacement. Negative for heart failure. Mild left lower lobe atelectasis with interval improvement. Mild right lower lobe atelectasis unchanged. No effusion IMPRESSION: Improvement in left lower lobe atelectasis. Electronically Signed   By: Marlan Palau M.D.   On: 12/24/2017 09:19    Ct Angio Chest Aorta W/cm &/or Wo/cm  Result Date: 12/16/2017 CLINICAL DATA:  Aortic stenosis. Preoperative evaluation prior to planned aortic valve replacement surgery. EXAM: CT ANGIOGRAPHY CHEST WITH CONTRAST TECHNIQUE: Multidetector CT imaging of the chest was performed using the standard protocol during bolus administration of intravenous contrast. Multiplanar CT image reconstructions and MIPs were obtained to evaluate the vascular anatomy. CONTRAST:  75mL ISOVUE-370 IOPAMIDOL (ISOVUE-370) INJECTION 76% COMPARISON:  Prior CTA of the chest on 04/02/2009 FINDINGS: Cardiovascular: The aortic valve and annulus are heavily calcified. The aortic root measures approximately 3.9 cm in diameter at the level of the sinuses of Valsalva. The ascending thoracic aorta demonstrates maximal  diameter of 4.4 cm. The proximal arch measures 2.9 cm. The distal arch and proximal descending thoracic aorta are mildly dilated measuring up to 3.6 cm. The lower descending thoracic aorta measures 3 cm. No evidence of aortic dissection. No significant atherosclerotic plaque is identified in the thoracic aorta. Proximal great vessels are normally patent and demonstrate normal branching anatomy. The heart size is at the upper limits of normal. Mild amount of calcified plaque present in the proximal LAD. No pericardial fluid identified. Central pulmonary arteries are normal in caliber. Mediastinum/Nodes: No enlarged mediastinal, hilar, or axillary lymph nodes. Thyroid gland, trachea, and esophagus demonstrate no significant findings. Lungs/Pleura: There is no evidence of pulmonary edema, consolidation, pneumothorax, nodule or pleural fluid. Upper Abdomen: No acute abnormality. Musculoskeletal: No chest wall abnormality. No acute or significant osseous findings. Review of the MIP images confirms the above findings. IMPRESSION: Heavily calcified aortic valve with associated mild aneurysmal dilatation of the ascending thoracic aorta measuring 4.4 cm in greatest diameter. Aortic aneurysm NOS (ICD10-I71.9). Electronically Signed   By: Irish Lack M.D.   On: 12/16/2017 16:39       Discharge Instructions    Amb Referral to Cardiac Rehabilitation   Complete by:  As directed    Diagnosis:  Valve Replacement   Valve:  Aortic  Discharge Medications: Allergies as of 12/25/2017      Reactions   Tetanus Toxoid, Adsorbed Other (See Comments)   Pt. States as small child he had tetnus made from horse serum and he turned 'blue' ?? SHORTNESS OF BREATH ??      Medication List    STOP taking these medications   ibuprofen 200 MG tablet Commonly known as:  ADVIL,MOTRIN     TAKE these medications   acetaminophen 500 MG tablet Commonly known as:  TYLENOL Take 2 tablets (1,000 mg total) by mouth every 6  (six) hours as needed.   amiodarone 400 MG tablet Commonly known as:  PACERONE Take 1 tablet (400 mg total) by mouth 2 (two) times daily. X 7 days, then decrease to 200 mg (1/2 tab) by mouth twice daily   apixaban 5 MG Tabs tablet Commonly known as:  ELIQUIS Take 1 tablet (5 mg total) by mouth 2 (two) times daily. Start taking on:  12/26/2017   aspirin 81 MG tablet Take 81 mg by mouth daily.   hydrochlorothiazide 12.5 MG tablet Commonly known as:  HYDRODIURIL Take 12.5 mg by mouth daily.   hydrocortisone cream 1 % Apply 1 application topically daily as needed for itching.   metoprolol succinate 25 MG 24 hr tablet Commonly known as:  TOPROL-XL Take 25 mg by mouth daily.   neomycin-bacitracin-polymyxin ointment Commonly known as:  NEOSPORIN Apply 1 application topically daily as needed for wound care.   oxyCODONE 5 MG immediate release tablet Commonly known as:  Oxy IR/ROXICODONE Take 1 tablet (5 mg total) by mouth every 4 (four) hours as needed for severe pain.   oxymetazoline 0.05 % nasal spray Commonly known as:  AFRIN Place 1 spray into both nostrils daily as needed for congestion.   potassium chloride SA 20 MEQ tablet Commonly known as:  K-DUR,KLOR-CON Take 1 tablet (20 mEq total) by mouth daily.      The patient has been discharged on:   1.Beta Blocker:  Yes [ x  ]                              No   [   ]                              If No, reason:  2.Ace Inhibitor/ARB: Yes [   ]                                     No  [ n   ]                                     If No, reason: no labile BP  3.Statin:   Yes [   ]                  No  [  x ]                  If No, reason:No CAD  4.Marlowe Kays:  Yes  [  x ]                  No   [   ]  If No, reason:  Follow Up Appointments: Follow-up Information    Donata ClayVan Trigt, Theron AristaPeter, MD. Go to.   Specialty:  Cardiothoracic Surgery Why:  PA/LAT CXR to be taken ([facility] which is in the same  building as Dr. Zenaida NieceVan Trigt's office) on at;Appointment time is at Contact information: [address] [place] [address] [phone]        Juluis RainierBarnes, Elizabeth, MD. Call.   Specialty:  Family Medicine Why:  for a follow up appointment regarding HGA1C 5.8 (pre diabetes) Contact information: [address] [place] [address] [phone]        Rosalio MacadamiaGerhardt, Lori C, NP. Go on 01/10/2018.   Specialties:  Nurse Practitioner, Interventional Cardiology, Cardiology, Radiology Why:  Appointment time is at @11 :30am  Contact information: [address] [place] [address] [phone]           Signed: Carl Bestrin BarrettPA-C 12/25/2017, 11:11 AM

## 2017-12-22 NOTE — Progress Notes (Signed)
Patient ID: Stephen LivelyDavid E Mullins, male   DOB: [birth year], 72 y.o.   MRN: [id number] T[facility] Evening Rounds:  Hemodynamically stable, sinus 67. sats 94% Urine output ok Chest tube out  BMET    Component Value Date/Time   NA 137 12/22/2017 1604   NA 140 11/04/2017 1040   K 4.0 12/22/2017 1604   CL 100 (L) 12/22/2017 1604   CO2 23 12/22/2017 0339   GLUCOSE 132 (H) 12/22/2017 1604   BUN 18 12/22/2017 1604   BUN 15 11/04/2017 1040   CREATININE 0.90 12/22/2017 1604   CALCIUM 8.9 12/22/2017 0339   GFRNONAA >60 12/22/2017 0339   GFRAA >60 12/22/2017 0339   CBC    Component Value Date/Time   WBC 15.9 (H) 12/22/2017 0339   RBC 4.32 12/22/2017 0339   HGB 13.3 12/22/2017 1604   HGB 16.7 11/04/2017 1040   HCT 39.0 12/22/2017 1604   HCT 48.3 11/04/2017 1040   PLT 164 12/22/2017 0339   PLT 274 11/04/2017 1040   MCV 90.5 12/22/2017 0339   MCV 87 11/04/2017 1040   MCH 30.3 12/22/2017 0339   MCHC 33.5 12/22/2017 0339   RDW 13.4 12/22/2017 0339   RDW 13.5 11/04/2017 1040   LYMPHSABS 1.8 04/27/2017 0946   EOSABS 0.2 04/27/2017 0946   BASOSABS 0.0 04/27/2017 0946

## 2017-12-22 NOTE — Progress Notes (Signed)
2 Days Post-Op Procedure(s) (LRB): AORTIC VALVE REPLACEMENT (AVR) (N/A) TRANSESOPHAGEAL ECHOCARDIOGRAM (TEE) (N/A) Subjective: Walking, still sore HR better   Objective: Vital signs in last 24 hours: Temp:  [98.3 F (36.8 C)-99.3 F (37.4 C)] 98.5 F (36.9 C) (04/17 0733) Pulse Rate:  [58-86] 63 (04/17 0700) Cardiac Rhythm: Normal sinus rhythm;Atrial paced (04/17 0000) Resp:  [11-26] 15 (04/17 0700) BP: (113-148)/(50-86) 129/73 (04/17 0700) SpO2:  [93 %-98 %] 95 % (04/17 0700) Arterial Line BP: (119-191)/(59-85) 148/59 (04/17 0700) Weight:  [208 lb 1.8 oz (94.4 kg)] 208 lb 1.8 oz (94.4 kg) (04/17 0400)  Hemodynamic parameters for last 24 hours: PAP: (33-43)/(16-23) 43/23  Intake/Output from previous day: 04/16 0701 - 04/17 0700 In: 2198.1 [P.O.:600; I.V.:928.1; IV Piggyback:650] Out: 2315 [Urine:1875; Chest Tube:440] Intake/Output this shift: No intake/output data recorded.        Exam    General- alert and comfortable    Neck- no JVD, no cervical adenopathy palpable, no carotid bruit   Lungs- clear without rales, wheezes   Cor- regular rate and rhythm, no murmur , gallop   Abdomen- soft, non-tender   Extremities - warm, non-tender, minimal edema   Neuro- oriented, appropriate, no focal weakness  Lab Results: Recent Labs    12/21/17 1655 12/21/17 1747 12/22/17 0339  WBC 14.8*  --  15.9*  HGB 13.7 12.9* 13.1  HCT 40.3 38.0* 39.1  PLT 166  --  164   BMET:  Recent Labs    12/21/17 0434  12/21/17 1747 12/22/17 0339  NA 140  --  137 136  K 3.7  --  4.2 4.3  CL 109  --  103 104  CO2 24  --   --  23  GLUCOSE 101*  --  156* 137*  BUN 11  --  12 13  CREATININE 0.83   < > 0.80 0.93  CALCIUM 8.0*  --   --  8.9   < > = values in this interval not displayed.    PT/INR:  Recent Labs    12/20/17 1224  LABPROT 17.9*  INR 1.49   ABG    Component Value Date/Time   PHART 7.331 (L) 12/20/2017 1859   HCO3 23.5 12/20/2017 1859   TCO2 23 12/21/2017 1747   ACIDBASEDEF 2.0 12/20/2017 1859   O2SAT 98.0 12/20/2017 1859   CBG (last 3)  Recent Labs    12/21/17 2001 12/22/17 0038 12/22/17 0731  GLUCAP 122* 133* 126*    Assessment/Plan: S/P Procedure(s) (LRB): AORTIC VALVE REPLACEMENT (AVR) (N/A) TRANSESOPHAGEAL ECHOCARDIOGRAM (TEE) (N/A) Mobilize Diuresis d/c tubes/lines BP control - iv hydralazine prn   LOS: 2 days    Stephen Mullins 12/22/2017

## 2017-12-22 NOTE — Progress Notes (Signed)
Mediastinal chest tube removed  Per orders without complications. Patient will stay in bed for over one hour.

## 2017-12-23 ENCOUNTER — Inpatient Hospital Stay (HOSPITAL_COMMUNITY): Payer: Medicare HMO

## 2017-12-23 LAB — COMPREHENSIVE METABOLIC PANEL
ALT: 13 U/L — ABNORMAL LOW (ref 17–63)
AST: 24 U/L (ref 15–41)
Albumin: 2.9 g/dL — ABNORMAL LOW (ref 3.5–5.0)
Alkaline Phosphatase: 44 U/L (ref 38–126)
Anion gap: 8 (ref 5–15)
BUN: 21 mg/dL — ABNORMAL HIGH (ref 6–20)
CO2: 24 mmol/L (ref 22–32)
Calcium: 8.5 mg/dL — ABNORMAL LOW (ref 8.9–10.3)
Chloride: 106 mmol/L (ref 101–111)
Creatinine, Ser: 0.87 mg/dL (ref 0.61–1.24)
GFR calc Af Amer: >60 mL/min (ref >60)
GFR calc non Af Amer: >60 mL/min (ref >60)
Glucose, Bld: 123 mg/dL — ABNORMAL HIGH (ref 65–99)
Potassium: 3.9 mmol/L (ref 3.5–5.1)
Sodium: 138 mmol/L (ref 135–145)
Total Bilirubin: 1 mg/dL (ref 0.3–1.2)
Total Protein: 5.6 g/dL — ABNORMAL LOW (ref 6.5–8.1)

## 2017-12-23 LAB — CBC
HCT: 36.8 % — ABNORMAL LOW (ref 39.0–52.0)
Hemoglobin: 11.9 g/dL — ABNORMAL LOW (ref 13.0–17.0)
MCH: 29.5 pg (ref 26.0–34.0)
MCHC: 32.3 g/dL (ref 30.0–36.0)
MCV: 91.3 fL (ref 78.0–100.0)
Platelets: 173 10*3/uL (ref 150–400)
RBC: 4.03 MIL/uL — ABNORMAL LOW (ref 4.22–5.81)
RDW: 13.5 % (ref 11.5–15.5)
WBC: 11.4 10*3/uL — ABNORMAL HIGH (ref 4.0–10.5)

## 2017-12-23 LAB — GLUCOSE, CAPILLARY
Glucose-Capillary: 111 mg/dL — ABNORMAL HIGH (ref 65–99)
Glucose-Capillary: 132 mg/dL — ABNORMAL HIGH (ref 65–99)
Glucose-Capillary: 116 mg/dL — ABNORMAL HIGH (ref 65–99)

## 2017-12-23 IMAGING — DX DG CHEST 1V PORT
1 series · 1 of 1 positions shown · non-contrast
Comparison: Radiograph [DATE].

CLINICAL DATA: Status post aortic valve replacement.

EXAM:
PORTABLE CHEST 1 VIEW

[chest ap]
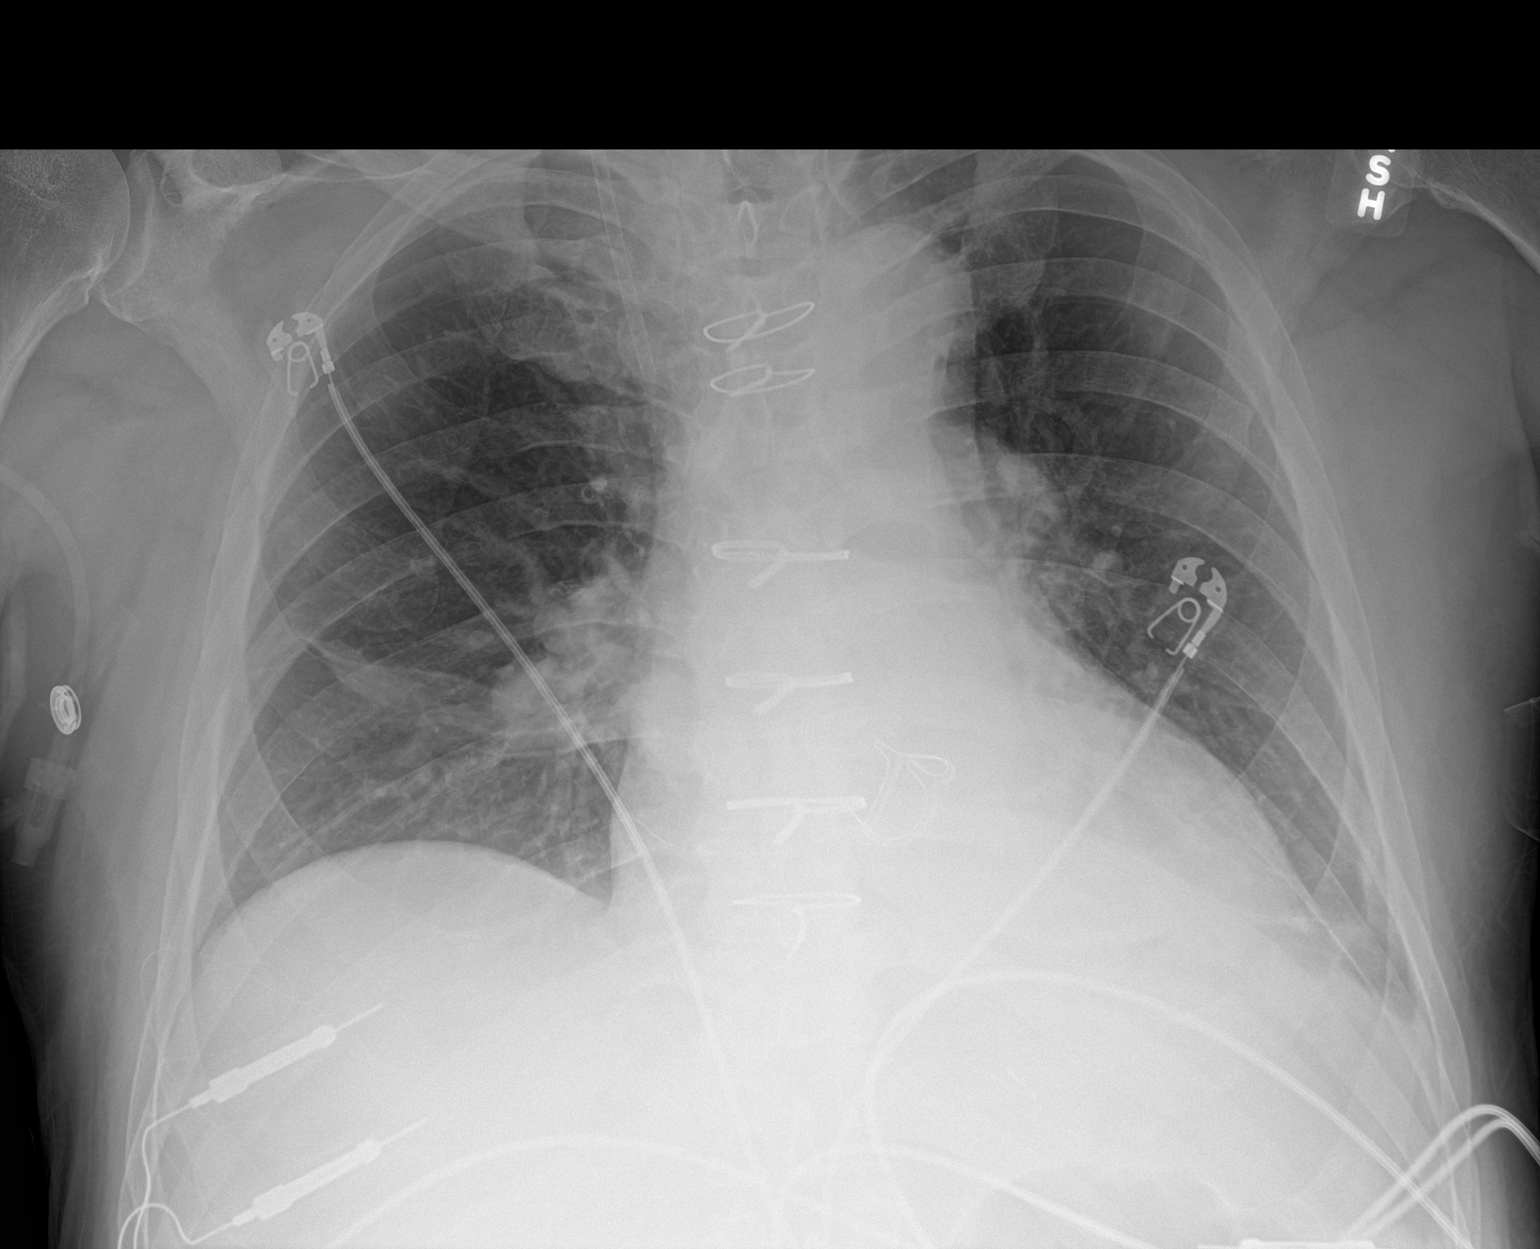

[1 of 1 positions shown; findings below may reference images not displayed]

FINDINGS: Stable cardiomegaly. Aortic valve prosthesis is noted. Right
internal jugular venous sheath is unchanged in position. No
pneumothorax is noted. Stable bibasilar subsegmental atelectasis is
noted. Minimal left pleural effusion is noted. Bony thorax is
unremarkable.
IMPRESSION: Stable bibasilar subsegmental atelectasis with minimal left pleural
effusion.

## 2017-12-23 MED ORDER — SODIUM CHLORIDE 0.9% FLUSH: 3.0000 mL | INTRAVENOUS | Status: DC | PRN

## 2017-12-23 MED ORDER — AMIODARONE HCL 200 MG PO TABS
200.0000 mg | ORAL_TABLET | Freq: Two times a day (BID) | ORAL | Status: DC
  Administered 2017-12-23 (×2): 200 mg via ORAL
  Filled 2017-12-23 (×2): qty 1

## 2017-12-23 MED ORDER — POTASSIUM CHLORIDE CRYS ER 20 MEQ PO TBCR
30.0000 meq | EXTENDED_RELEASE_TABLET | Freq: Once | ORAL | Status: AC
  Administered 2017-12-23: 30 meq via ORAL
  Filled 2017-12-23: qty 1

## 2017-12-23 MED ORDER — SODIUM CHLORIDE 0.9% FLUSH
3.0000 mL | Freq: Two times a day (BID) | INTRAVENOUS | Status: DC
  Administered 2017-12-23 – 2017-12-25 (×4): 3 mL via INTRAVENOUS

## 2017-12-23 MED ORDER — MOVING RIGHT ALONG BOOK
Freq: Once | Status: DC
  Filled 2017-12-23: qty 1

## 2017-12-23 MED ORDER — AMIODARONE IV BOLUS ONLY 150 MG/100ML
150.0000 mg | Freq: Once | INTRAVENOUS | Status: AC
  Administered 2017-12-23: 150 mg via INTRAVENOUS

## 2017-12-23 MED ORDER — OXYCODONE HCL 5 MG PO TABS
5.0000 mg | ORAL_TABLET | ORAL | Status: DC | PRN
  Administered 2017-12-23: 5 mg via ORAL
  Filled 2017-12-23: qty 1

## 2017-12-23 MED ORDER — METOPROLOL TARTRATE 25 MG PO TABS
25.0000 mg | ORAL_TABLET | Freq: Two times a day (BID) | ORAL | Status: DC
  Administered 2017-12-24 – 2017-12-25 (×3): 25 mg via ORAL
  Filled 2017-12-23 (×3): qty 1

## 2017-12-23 MED ORDER — SODIUM CHLORIDE 0.9 % IV SOLN: 250.0000 mL | INTRAVENOUS | Status: DC | PRN

## 2017-12-23 MED ORDER — METOPROLOL TARTRATE 25 MG PO TABS
25.0000 mg | ORAL_TABLET | Freq: Once | ORAL | Status: AC
  Administered 2017-12-23: 25 mg via ORAL
  Filled 2017-12-23: qty 1

## 2017-12-23 MED ORDER — TRAMADOL HCL 50 MG PO TABS: 50.0000 mg | ORAL_TABLET | ORAL | Status: DC | PRN

## 2017-12-23 NOTE — Care Management Note (Signed)
Case Management Note Donn PieriniKristi Nashly Olsson RN, BSN Unit 4E-Case Manager- 2H Coverage [phone]  Patient Details  Name: Rosary LivelyDavid E Camino MRN: [id number] Date of Birth: 08-17-1946  Subjective/Objective:   Pt admitted s/p AVR                 Action/Plan: PTA pt lived at home with spouse- CM to follow for transition of care needs.   Expected Discharge Date:                  Expected Discharge Plan:  Home/Self Care  In-House Referral:     Discharge planning Services  CM Consult  Post Acute Care Choice:    Choice offered to:     DME Arranged:    DME Agency:     HH Arranged:    HH Agency:     Status of Service:  In process, will continue to follow  If discussed at Long Length of Stay Meetings, dates discussed:    Discharge Disposition:   Additional Comments:  Darrold SpanWebster, Korrie Hofbauer Hall, RN 12/23/2017, 10:39 AM

## 2017-12-23 NOTE — Progress Notes (Signed)
Heart rhythm converted from affib to NSR just before amiodarone was about to be started. Amiodarone held for now.

## 2017-12-23 NOTE — Progress Notes (Signed)
Patient moved to 4E22 via wheelchair. All belongings taken with patient. Wife at the bedside.

## 2017-12-23 NOTE — Plan of Care (Signed)
  Problem: Pain Managment: Goal: General experience of comfort will improve Outcome: Progressing   Problem: Skin Integrity: Goal: Risk for impaired skin integrity will decrease Outcome: Completed/Met   Problem: Activity: Goal: Risk for activity intolerance will decrease Outcome: Completed/Met   Problem: Cardiac: Goal: Hemodynamic stability will improve Outcome: Progressing   Problem: Urinary Elimination: Goal: Ability to achieve and maintain adequate renal perfusion and functioning will improve Outcome: Progressing   Problem: Cardiac: Goal: Hemodynamic stability will improve Outcome: Progressing

## 2017-12-23 NOTE — Progress Notes (Signed)
Stephen Mullins. [name] called and new order to keep introducer in for one more day. Will inform nurse when report given.

## 2017-12-23 NOTE — Progress Notes (Signed)
Patient having bursts of A. Fib with rates changing 140-200. Jacques Earthly. [name] PA called and notified. New orders received.

## 2017-12-23 NOTE — Progress Notes (Addendum)
[facility] DAILY ICU PROGRESS NOTE                   [address]            [place] [id number]          [phone]   3 Days Post-Op Procedure(s) (LRB): AORTIC VALVE REPLACEMENT (AVR) (N/A) TRANSESOPHAGEAL ECHOCARDIOGRAM (TEE) (N/A)  Total Length of Stay:  LOS: 3 days   Subjective: Patient with loose stools  Objective: Vital signs in last 24 hours: Temp:  [98.4 F (36.9 C)-99.3 F (37.4 C)] 98.5 F (36.9 C) (04/18 0402) Pulse Rate:  [59-75] 62 (04/18 0400) Cardiac Rhythm: Normal sinus rhythm (04/18 0400) Resp:  [14-25] 22 (04/18 0519) BP: (109-168)/(60-79) 128/60 (04/18 0400) SpO2:  [87 %-97 %] 96 % (04/18 0400) Arterial Line BP: (126-168)/(52-65) 126/52 (04/17 0900) Weight:  [204 lb 12.9 oz (92.9 kg)] 204 lb 12.9 oz (92.9 kg) (04/18 0500)  Filed Weights   12/21/17 0600 12/22/17 0400 12/23/17 0500  Weight: 209 lb 10.5 oz (95.1 kg) 208 lb 1.8 oz (94.4 kg) 204 lb 12.9 oz (92.9 kg)    Weight change: -3 lb 4.9 oz (-1.5 kg)      Intake/Output from previous day: 04/17 0701 - 04/18 0700 In: 872.8 [P.O.:360; I.V.:512.8] Out: 1155 [Urine:1095; Chest Tube:60]  Intake/Output this shift: No intake/output data recorded.  Current Meds: Scheduled Meds: . acetaminophen  1,000 mg Oral Q6H   Or  . acetaminophen (TYLENOL) oral liquid 160 mg/5 mL  1,000 mg Per Tube Q6H  . amiodarone  150 mg Intravenous Once  . aspirin EC  325 mg Oral Daily   Or  . aspirin  324 mg Per Tube Daily  . bisacodyl  10 mg Oral Daily   Or  . bisacodyl  10 mg Rectal Daily  . Chlorhexidine Gluconate Cloth  6 each Topical Daily  . docusate sodium  200 mg Oral Daily  . furosemide  40 mg Intravenous BID  . insulin detemir  10 Units Subcutaneous BID  . mouth rinse  15 mL Mouth Rinse BID  . metoCLOPramide (REGLAN) injection  10 mg Intravenous Q6H  . metoprolol tartrate  12.5 mg Oral BID  . pantoprazole  40 mg Oral Daily  . potassium chloride  20 mEq Oral BID  . sodium chloride flush   10-40 mL Intracatheter Q12H  . sodium chloride flush  3 mL Intravenous Q12H   Continuous Infusions: . sodium chloride Stopped (12/21/17 1200)  . sodium chloride Stopped (12/22/17 [id number])  . sodium chloride Stopped (12/20/17 2300)  . amiodarone    . lactated ringers 20 mL/hr at 12/22/17 0600   PRN Meds:.sodium chloride, hydrALAZINE, metoprolol tartrate, midazolam, morphine injection, ondansetron (ZOFRAN) IV, oxyCODONE, sodium chloride flush, sodium chloride flush, traMADol  General appearance: alert, cooperative and no distress Neurologic: intact Heart: RRR, no murmur Lungs: Slighlty diminshed at bases Abdomen: Soft, non tender, bowel sounds present Extremities: SCDs intact Wound: Aquacel removed and wound is clean and dry.  Lab Results: CBC: Recent Labs    12/21/17 1655  12/22/17 0339 12/22/17 1604  WBC 14.8*  --  15.9*  --   HGB 13.7   < > 13.1 13.3  HCT 40.3   < > 39.1 39.0  PLT 166  --  164  --    < > = values in this interval not displayed.   BMET:  Recent Labs    12/22/17 0339 12/22/17 1604 12/23/17 0535  NA 136 137 138  K 4.3 4.0 3.9  CL 104 100* 106  CO2 23  --  24  GLUCOSE 137* 132* 123*  BUN 13 18 21*  CREATININE 0.93 0.90 0.87  CALCIUM 8.9  --  8.5*    CMET: Lab Results  Component Value Date   WBC 15.9 (H) 12/22/2017   HGB 13.3 12/22/2017   HCT 39.0 12/22/2017   PLT 164 12/22/2017   GLUCOSE 123 (H) 12/23/2017   ALT 13 (L) 12/23/2017   AST 24 12/23/2017   NA 138 12/23/2017   K 3.9 12/23/2017   CL 106 12/23/2017   CREATININE 0.87 12/23/2017   BUN 21 (H) 12/23/2017   CO2 24 12/23/2017   INR 1.49 12/20/2017   HGBA1C 5.8 (H) 12/16/2017    PT/INR:  Recent Labs    12/20/17 1224  LABPROT 17.9*  INR 1.49   Radiology: No results found.   Assessment/Plan: S/P Procedure(s) (LRB): AORTIC VALVE REPLACEMENT (AVR) (N/A) TRANSESOPHAGEAL ECHOCARDIOGRAM (TEE) (N/A)  1. CV-He went into a fib before midnight. He converted back to SR just before  Amiodarone was to be started. On Lopressor 12.5 mg bid. Will start Amiodarone 200 mg bid as discussed with Dr. [name]. 2. Pulmonary-On room air. Chest tubes with 400 cc since surgery. CXR this am appears stable (low lung volumes, no pneumothorax, left base atelectasis and small pleural effusion, and cardiomegaly). Encourage incentive spirometer. 3. Volume overload-on Lasix 40 mg IV bid 5. Supplement potassium 6. CBGs 131/114/111. Pre op HGA1C 5.8. 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 He will need further surveillance with medical doctor after discharge. He will be provided diet recommendations with discharge paperwork. 7. Regarding mucsle cramps, as discussed with Dr. [name], Flexeril bid and keep potassium supplemented accordingly. 8. Stop stool softeners secondary to diarrhea 9. Transfer to 4E  Donielle Margaretann Loveless PA-C 12/23/2017 7:29 AM   D/c central line in am tomorrow patient examined and medical record reviewed,agree with above note. Kathlee Nations Trigt III 12/23/2017

## 2017-12-23 NOTE — Progress Notes (Signed)
Patient HR increased to above 130's x 2 with activity. Patient HR sustained at 120-138 for greater than 30 minutes following physical activity and ambulation. PRN IV Metoprolol 2.5 mL given IV. Patient assisted to stand at bedside for use of urinal. Text page sent to Gershon CraneWayne Gold, MD. Call returned from MD. New orders received and verified. Pharmacy called to confirm medication orders. Patient HR decreased to 64 per telemetry monitor. Holding one time order for Metoprolol at this time due to abrupt change in heart rate. Patient converted to NSR in low 60s.

## 2017-12-24 ENCOUNTER — Inpatient Hospital Stay (HOSPITAL_COMMUNITY): Payer: Medicare HMO

## 2017-12-24 LAB — CBC
HEMATOCRIT: 34.4 % — AB (ref 39.0–52.0)
Hemoglobin: 11 g/dL — ABNORMAL LOW (ref 13.0–17.0)
MCH: 28.9 pg (ref 26.0–34.0)
MCHC: 32 g/dL (ref 30.0–36.0)
MCV: 90.3 fL (ref 78.0–100.0)
PLATELETS: 188 10*3/uL (ref 150–400)
RBC: 3.81 MIL/uL — AB (ref 4.22–5.81)
RDW: 13.3 % (ref 11.5–15.5)
WBC: 8.8 10*3/uL (ref 4.0–10.5)

## 2017-12-24 LAB — BASIC METABOLIC PANEL
Anion gap: 9 (ref 5–15)
BUN: 18 mg/dL (ref 6–20)
CALCIUM: 8.6 mg/dL — AB (ref 8.9–10.3)
CO2: 26 mmol/L (ref 22–32)
Chloride: 105 mmol/L (ref 101–111)
Creatinine, Ser: 0.84 mg/dL (ref 0.61–1.24)
Glucose, Bld: 118 mg/dL — ABNORMAL HIGH (ref 65–99)
Potassium: 3.5 mmol/L (ref 3.5–5.1)
Sodium: 140 mmol/L (ref 135–145)

## 2017-12-24 IMAGING — DX DG CHEST 1V PORT
1 series · 1 of 1 positions shown · non-contrast
Comparison: [DATE]

CLINICAL DATA: Aortic valve replacement

EXAM:
PORTABLE CHEST 1 VIEW

[chest ap]
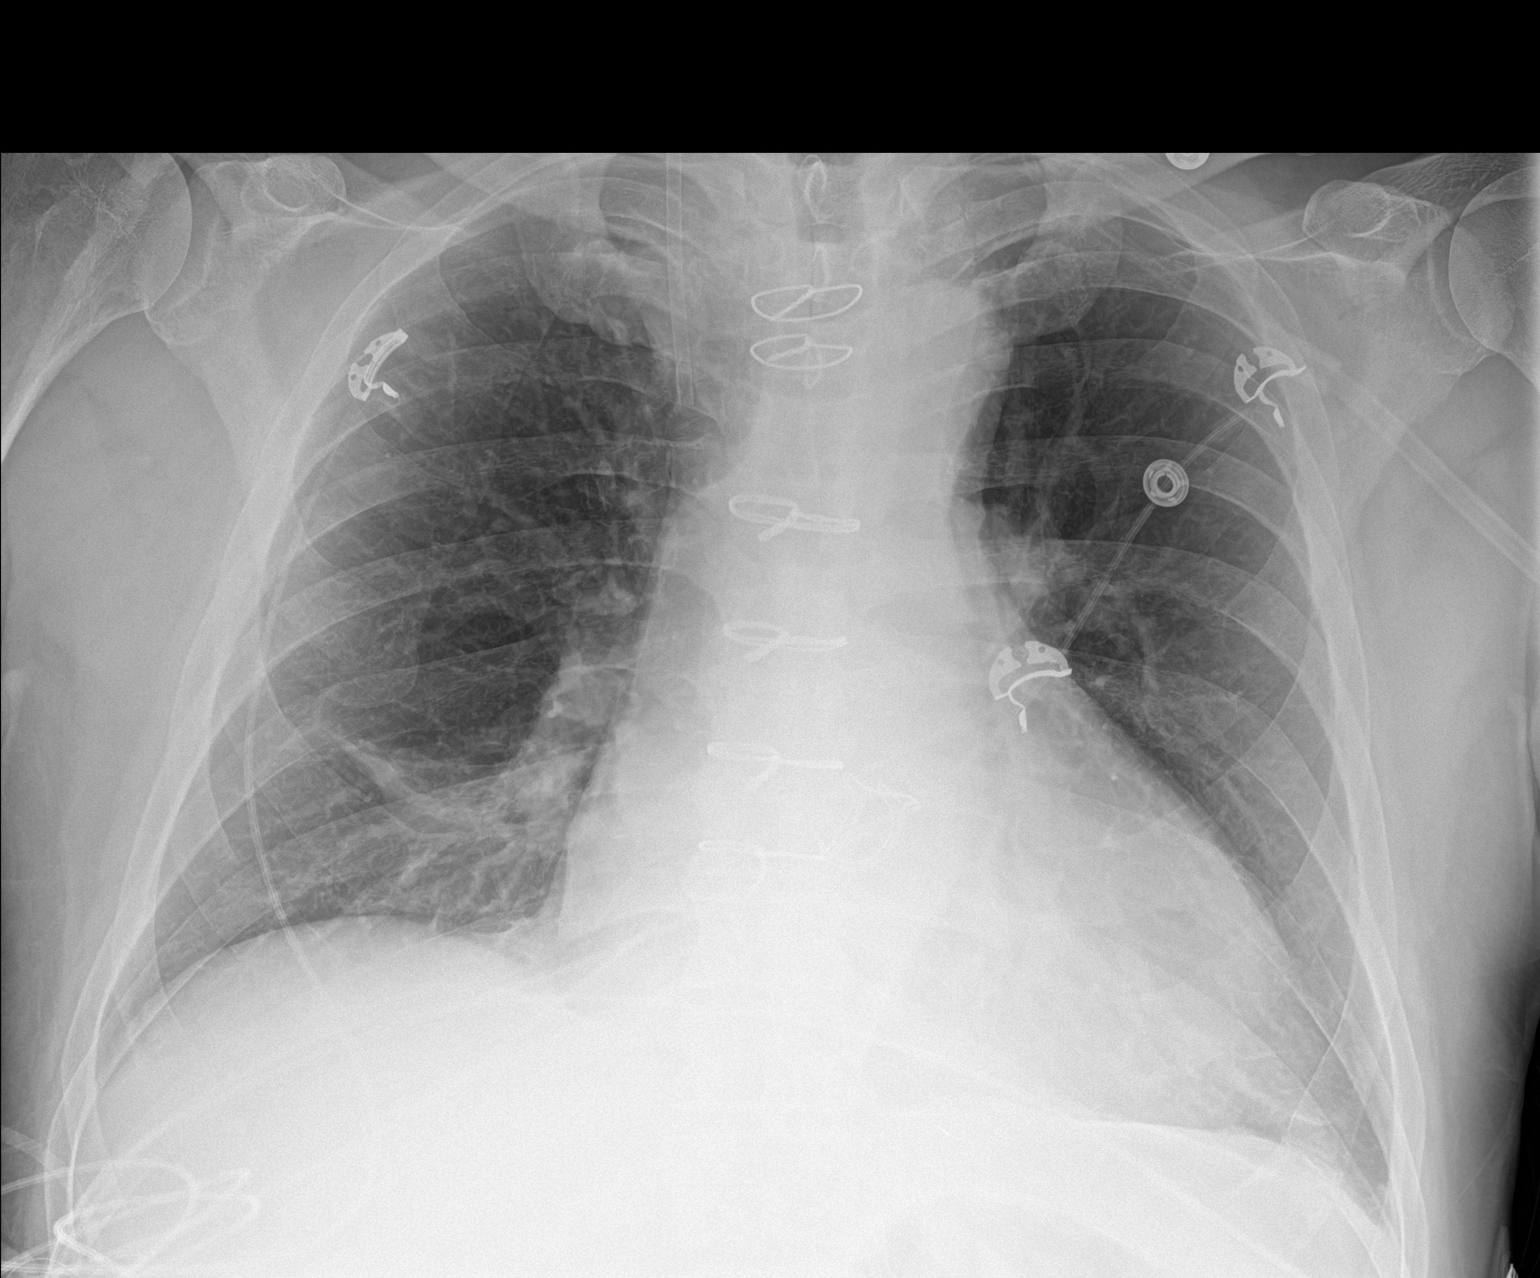

[1 of 1 positions shown; findings below may reference images not displayed]

FINDINGS: Right jugular sheath in the right innominate vein unchanged. No
pneumothorax.

Cardiac enlargement. Aortic valve replacement. Negative for heart
failure. Mild left lower lobe atelectasis with interval improvement.
Mild right lower lobe atelectasis unchanged. No effusion
IMPRESSION: Improvement in left lower lobe atelectasis.

## 2017-12-24 MED ORDER — POTASSIUM CHLORIDE CRYS ER 20 MEQ PO TBCR
40.0000 meq | EXTENDED_RELEASE_TABLET | Freq: Once | ORAL | Status: AC
  Administered 2017-12-24: 40 meq via ORAL
  Filled 2017-12-24: qty 2

## 2017-12-24 MED ORDER — ENOXAPARIN SODIUM 40 MG/0.4ML ~~LOC~~ SOLN
40.0000 mg | SUBCUTANEOUS | Status: DC
  Administered 2017-12-24 – 2017-12-25 (×2): 40 mg via SUBCUTANEOUS
  Filled 2017-12-24 (×2): qty 0.4

## 2017-12-24 MED ORDER — AMIODARONE HCL 200 MG PO TABS
400.0000 mg | ORAL_TABLET | Freq: Two times a day (BID) | ORAL | Status: DC
  Administered 2017-12-24 – 2017-12-25 (×3): 400 mg via ORAL
  Filled 2017-12-24 (×3): qty 2

## 2017-12-24 NOTE — Progress Notes (Signed)
CARDIAC REHAB PHASE I   PRE:  Rate/Rhythm: 78 Afib  BP:  Supine:   Sitting: 165/73  Standing:    SaO2: 95 RA  MODE:  Ambulation: 790 ft   POST:  Rate/Rhythm: 79 Afib  BP:  Supine:   Sitting: 148/97  Standing:    SaO2: 97 RA 1355-1420 Assisted X 1 to ambulate. Gait steady. Pt walked 790 feet without c/o. HR stable 70's with walking Afib. Pt to recliner after walk with call light in reach. We will continue to follow pt.  Melina CopaLisa Brunilda Eble RN 12/24/2017 2:21 PM

## 2017-12-24 NOTE — Anesthesia Postprocedure Evaluation (Signed)
Anesthesia Post Note  Patient: Rosary LivelyDavid E Hoglund  Procedure(s) Performed: AORTIC VALVE REPLACEMENT (AVR) (N/A Chest) TRANSESOPHAGEAL ECHOCARDIOGRAM (TEE) (N/A )     Patient location during evaluation: SICU Anesthesia Type: General Level of consciousness: awake and alert, oriented and patient cooperative Pain management: pain level controlled Vital Signs Assessment: post-procedure vital signs reviewed and stable Respiratory status: spontaneous breathing, nonlabored ventilation, respiratory function stable and patient connected to nasal cannula oxygen (pt has been ambulating) Cardiovascular status: blood pressure returned to baseline and stable Postop Assessment: no apparent nausea or vomiting and adequate PO intake Anesthetic complications: no Comments: Delayed entry, but eval in SICU    Last Vitals:  Vitals:   12/23/17 2102 12/23/17 2333  BP: (!) 147/77 139/74  Pulse: 84 67  Resp:  18  Temp: 37.2 C 37.1 C  SpO2: 93%     Last Pain:  Vitals:   12/24/17 0704  TempSrc:   PainSc: 2                  Raynard Mapps,E. Kenyatte Gruber

## 2017-12-24 NOTE — Progress Notes (Addendum)
      [address]       [place] [id number]             [phone]        4 Days Post-Op Procedure(s) (LRB): AORTIC VALVE REPLACEMENT (AVR) (N/A) TRANSESOPHAGEAL ECHOCARDIOGRAM (TEE) (N/A)  Subjective: Patient just waking up this am. He has no specific complaints.  Objective: Vital signs in last 24 hours: Temp:  [98.4 F (36.9 C)-99 F (37.2 C)] 98.7 F (37.1 C) (04/18 2333) Pulse Rate:  [56-96] 67 (04/18 2333) Cardiac Rhythm: Normal sinus rhythm;Ventricular paced (04/18 2333) Resp:  [14-22] 18 (04/18 2333) BP: (118-159)/(67-91) 139/74 (04/18 2333) SpO2:  [93 %-96 %] 93 % (04/18 2102)  Pre op weight 90.6  kg Current Weight  12/23/17 204 lb 12.9 oz (92.9 kg)       Intake/Output from previous day: 04/18 0701 - 04/19 0700 In: 820 [P.O.:600; I.V.:220] Out: 1650 [Urine:1650]   Physical Exam:  Cardiovascular: RRR, flow murmur Pulmonary: Clear to auscultation bilaterally Abdomen: Soft, non tender, bowel sounds present. Extremities: Mild bilateral lower extremity edema. Wounds: Clean and dry.  No erythema or signs of infection.  Lab Results: CBC: Recent Labs    12/23/17 0535 12/24/17 0500  WBC 11.4* 8.8  HGB 11.9* 11.0*  HCT 36.8* 34.4*  PLT 173 188   BMET:  Recent Labs    12/23/17 0535 12/24/17 0500  NA 138 140  K 3.9 3.5  CL 106 105  CO2 24 26  GLUCOSE 123* 118*  BUN 21* 18  CREATININE 0.87 0.84  CALCIUM 8.5* 8.6*    PT/INR:  Lab Results  Component Value Date   INR 1.49 12/20/2017   INR 1.03 12/16/2017   INR 1.1 11/04/2017   ABG:  INR: Will add last result for INR, ABG once components are confirmed Will add last 4 CBG results once components are confirmed  Assessment/Plan: 1. CV-He has been having brief episodes of a fib with RVR. He converts back to SR but because of paroxysmal a fib may need anticoagulation. On Lopressor 25 mg bid and Amiodarone 200 mg bid. As discussed with Dr. Donata ClayVan Trigt, will increase Amiodarone  to 400 mg bid, start Lovenox. Will likely need Eliquis. 2. Pulmonary-On room air. CXR this am appears stable ( no pneumothorax, left base atelectasis and small pleural effusion, and cardiomegaly). Encourage incentive spirometer. 3. Volume overload-on Lasix 40 mg IV bid. Will change to oral in am 4. ABL anemia-H and H stable at 11 and 34.4 5. Supplement potassium. 6. Roll and tape wires. Remove EPW in am 7. Remove sleeve  Donielle M ZimmermanPA-C 12/24/2017,7:17 AM   Start Eliquis at discharge DC EPWs sat if not needed Looking like DC sun/mon patient examined and medical record reviewed,agree with above note. Kathlee Nationseter Van Trigt III 12/24/2017

## 2017-12-25 LAB — BASIC METABOLIC PANEL
Anion gap: 9 (ref 5–15)
BUN: 17 mg/dL (ref 6–20)
CALCIUM: 9 mg/dL (ref 8.9–10.3)
CHLORIDE: 102 mmol/L (ref 101–111)
CO2: 28 mmol/L (ref 22–32)
CREATININE: 0.95 mg/dL (ref 0.61–1.24)
GFR calc non Af Amer: >60 mL/min (ref >60)
Glucose, Bld: 126 mg/dL — ABNORMAL HIGH (ref 65–99)
Potassium: 3.5 mmol/L (ref 3.5–5.1)
SODIUM: 139 mmol/L (ref 135–145)

## 2017-12-25 LAB — CBC
HCT: 35.9 % — ABNORMAL LOW (ref 39.0–52.0)
HEMOGLOBIN: 11.6 g/dL — AB (ref 13.0–17.0)
MCH: 29.2 pg (ref 26.0–34.0)
MCHC: 32.3 g/dL (ref 30.0–36.0)
MCV: 90.4 fL (ref 78.0–100.0)
PLATELETS: 242 10*3/uL (ref 150–400)
RBC: 3.97 MIL/uL — ABNORMAL LOW (ref 4.22–5.81)
RDW: 13.6 % (ref 11.5–15.5)
WBC: 7.5 10*3/uL (ref 4.0–10.5)

## 2017-12-25 MED ORDER — APIXABAN 5 MG PO TABS: 5.0000 mg | ORAL_TABLET | Freq: Two times a day (BID) | ORAL | 1 refills | Status: DC

## 2017-12-25 MED ORDER — FUROSEMIDE 40 MG PO TABS: 40.0000 mg | ORAL_TABLET | Freq: Every day | ORAL | Status: DC

## 2017-12-25 MED ORDER — OXYCODONE HCL 5 MG PO TABS: 5.0000 mg | ORAL_TABLET | ORAL | 0 refills | Status: DC | PRN

## 2017-12-25 MED ORDER — POTASSIUM CHLORIDE CRYS ER 20 MEQ PO TBCR: 20.0000 meq | EXTENDED_RELEASE_TABLET | Freq: Every day | ORAL | 3 refills | Status: DC

## 2017-12-25 MED ORDER — ACETAMINOPHEN 500 MG PO TABS: 1000.0000 mg | ORAL_TABLET | Freq: Four times a day (QID) | ORAL | 0 refills | Status: AC | PRN

## 2017-12-25 MED ORDER — AMIODARONE HCL 400 MG PO TABS: 400.0000 mg | ORAL_TABLET | Freq: Two times a day (BID) | ORAL | 1 refills | Status: DC

## 2017-12-25 NOTE — Progress Notes (Signed)
CARDIAC REHAB PHASE I       Pt ambulating independently.  Education completed including risk factor modification, low fat-low cholesterol diet, exercise, and medication compliance.  Pt oriented to outpatient cardiac rehab.  [facility] pt [facility]  Understanding verbalized  Deveron FurlongJoann Hager [place]ston, RN, BSN Cardiac Pulmonary Rehab 12/25/17 10:32 AM

## 2017-12-25 NOTE — Progress Notes (Signed)
CTS removed without difficulty.  DSD applied.  Site clean and intact.

## 2017-12-25 NOTE — Progress Notes (Signed)
EPW removed without difficulty.  Patient informed of bedrest and of need to monitor for 3 hours post removal.

## 2017-12-25 NOTE — Discharge Instructions (Signed)
Aortic Valve Stenosis Aortic valve stenosis is a narrowing of the aortic valve. The aortic valve opens and closes to regulate blood flow between the lower left chamber of the heart (left ventricle) and the blood vessel that leads away from the heart (aorta). When the aortic valve becomes narrow, it makes it difficult for the heart to pump blood into the aorta, which causes the heart to work harder. The extra work can weaken the heart over time. Aortic valve stenosis can range from mild to severe. If untreated, it can become more severe over time and can lead to heart failure. What are the causes? This condition may be caused by:  Buildup of calcium around and on the valve. This can occur with aging. This is the most common cause of aortic valve stenosis.  Birth defect.  Rheumatic fever.  Radiation to the chest.  What increases the risk? You may be more likely to develop this condition if:  You are over the age of 39.  You were born with an abnormal bicuspid valve.  What are the signs or symptoms? You may have no symptoms until your condition becomes severe. It may take 10-20 years for mild or moderate aortic valve stenosis to become severe. Symptoms may include:  Shortness of breath. This may get worse during physical activity.  Feeling unusually weak and tired (fatigue).  Extreme discomfort in the chest, neck, or arm (angina).  A heartbeat that is irregular or faster than normal (palpitations).  Dizziness or fainting. This may happen when you get physically tired or after you take certain heart medicines, such as nitroglycerin.  How is this diagnosed? This condition may be diagnosed with:  A physical exam.  Echocardiogram. This is a type of imaging test that uses sound waves (ultrasound) to make an image of your heart. There are two types that may be used: ? Transthoracic echocardiogram (TTE). This type of echocardiogram is noninvasive, and it is usually done  first. ? Transesophageal echocardiogram (TEE). This type of echocardiogram is done by passing a flexible tube down your esophagus. The heart and the esophagus are close to each other, so your health care provider can take very clear, detailed pictures of the heart using this type of test.  Cardiac catheterization. In this procedure, a thin, flexible tube (catheter) is passed through a large vein in your neck, groin, or arm. This procedure provides information about arteries, structures, blood pressure, and oxygen levels in your heart.  Electrocardiogram (ECG). This records the electrical impulses of your heart and assesses heart function.  Stress tests. These are tests that evaluate the blood supply to your heart and your hearts response to exercise.  Blood tests.  You may work with a health care provider who specializes in the heart (cardiologist). How is this treated? Treatment depends on how severe your condition is and what your symptoms are. You will need to have your heart checked regularly to make sure that your condition is not getting worse or causing serious problems. If your condition is mild, no treatment may be needed. Treatment may include:  Medicines that help keep your heart rate regular.  Medicines that thin your blood (anticoagulants) to prevent the formation of blood clots.  Antibiotic medicines to help prevent infection.  Surgery to replace your aortic valve. This is the most common treatment for aortic valve stenosis. Several types of surgeries are available. The surgery may be done through a large incision over your heart (open heart surgery), or it may  be done using a minimally invasive technique (transcatheter aortic valve replacement, or TAVR).  Follow these instructions at home: Lifestyle   Limit alcohol intake to no more than 1 drink per day for nonpregnant women and 2 drinks per day for men. One drink equals 12 oz of beer, 5 oz of wine, or 1 oz of hard  liquor.  Do not use any tobacco products, such as cigarettes, chewing tobacco, or e-cigarettes. If you need help quitting, ask your health care provider.  Work with your health care provider to manage your blood pressure and cholesterol.  Maintain a healthy weight. Eating and drinking  Follow instructions from your health care provider about eating or drinking restrictions. ? Limit how much caffeine you drink. Caffeine can affect your heart's rate and rhythm.  Drink enough fluid to keep your urine clear or pale yellow.  Eat a heart-healthy diet. This should include plenty of fresh fruits and vegetables. If you eat meat, it should be lean cuts. Avoid foods that are: ? High in salt, saturated fat, or sugar. ? Canned or highly processed. ? Fried. Activity  Return to your normal activities as told by your health care provider. Ask your health care provider what activities are safe for you.  Exercise regularly, as told by your health care provider. Ask your health care provider what types of exercise are safe for you.  If your aortic valve stenosis is mild, you may need to avoid only very intense physical activity. The more severe your aortic valve stenosis is, the more activities you may need to avoid. General instructions  Take over-the-counter and prescription medicines only as told by your health care provider.  If you are a woman and you plan to become pregnant, talk with your health care provider before you become pregnant.  Tell all health care providers who care for you that you have aortic valve stenosis.  Keep all follow-up visits as told by your health care provider. This is important. Contact a health care provider if:  You have a fever. Get help right away if:  You develop chest pain or tightness.  You develop shortness of breath or difficulty breathing.  You feel light-headed.  You feel like you might faint.  Your heartbeat is irregular or faster than  normal. These symptoms may represent a serious problem that is an emergency. Do not wait to see if the symptoms will go away. Get medical help right away. Call your local emergency services (911 in the [place] Do not drive yourself to the hospital. This information is not intended to replace advice given to you by your health care provider. Make sure you discuss any questions you have with your health care provider. Document Released: 05/23/2003 Document Revised: 01/30/2016 Document Reviewed: 07/28/2015 Elsevier Interactive Patient Education  2017 Elsevier Inc. Preventing Type 2 Diabetes Mellitus Type 2 diabetes (type 2 diabetes mellitus) is a long-term (chronic) disease that affects blood sugar (glucose) levels. Normally, a hormone called insulin allows glucose to enter cells in the body. The cells use glucose for energy. In type 2 diabetes, one or both of these problems may be present:  The body does not make enough insulin.  The body does not respond properly to insulin that it makes (insulin resistance).  Insulin resistance or lack of insulin causes excess glucose to build up in the blood instead of going into cells. As a result, high blood glucose (hyperglycemia) develops, which can cause many complications. Being overweight or obese and having an  inactive (sedentary) lifestyle can increase your risk for diabetes. Type 2 diabetes can be delayed or prevented by making certain nutrition and lifestyle changes. What nutrition changes can be made?  Eat healthy meals and snacks regularly. Keep a healthy snack with you for when you get hungry between meals, such as fruit or a handful of nuts.  Eat lean meats and proteins that are low in saturated fats, such as chicken, fish, egg whites, and beans. Avoid processed meats.  Eat plenty of fruits and vegetables and plenty of grains that have not been processed (whole grains). It is recommended that you eat: ? 1?2 cups of fruit every day. ? 2?3 cups  of vegetables every day. ? 6?8 oz of whole grains every day, such as oats, whole wheat, bulgur, brown rice, quinoa, and millet.  Eat low-fat dairy products, such as milk, yogurt, and cheese.  Eat foods that contain healthy fats, such as nuts, avocado, olive oil, and canola oil.  Drink water throughout the day. Avoid drinks that contain added sugar, such as soda or sweet tea.  Follow instructions from your health care provider about specific eating or drinking restrictions.  Control how much food you eat at a time (portion size). ? Check food labels to find out the serving sizes of foods. ? Use a kitchen scale to weigh amounts of foods.  Saute or steam food instead of frying it. Cook with water or broth instead of oils or butter.  Limit your intake of: ? Salt (sodium). Have no more than 1 tsp (2,400 mg) of sodium a day. If you have heart disease or high blood pressure, have less than ? tsp (1,500 mg) of sodium a day. ? Saturated fat. This is fat that is solid at room temperature, such as butter or fat on meat. What lifestyle changes can be made?  Activity  Do moderate-intensity physical activity for at least 30 minutes on at least 5 days of the week, or as much as told by your health care provider.  Ask your health care provider what activities are safe for you. A mix of physical activities may be best, such as walking, swimming, cycling, and strength training.  Try to add physical activity into your day. For example: ? Park in spots that are farther away than usual, so that you walk more. For example, park in a far corner of the parking lot when you go to the office or the grocery store. ? Take a walk during your lunch break. ? Use stairs instead of elevators or escalators. Weight Loss  Lose weight as directed. Your health care provider can determine how much weight loss is best for you and can help you lose weight safely.  If you are overweight or obese, you may be instructed  to lose at least 5?7 % of your body weight. Alcohol and Tobacco   Limit alcohol intake to no more than 1 drink a day for nonpregnant women and 2 drinks a day for men. One drink equals 12 oz of beer, 5 oz of wine, or 1 oz of hard liquor.  Do not use any tobacco products, such as cigarettes, chewing tobacco, and e-cigarettes. If you need help quitting, ask your health care provider. Work With Your Health Care Provider  Have your blood glucose tested regularly, as told by your health care provider.  Discuss your risk factors and how you can reduce your risk for diabetes.  Get screening tests as told by your health care provider. You  may have screening tests regularly, especially if you have certain risk factors for type 2 diabetes.  Make an appointment with a diet and nutrition specialist (registered dietitian). A registered dietitian can help you make a healthy eating plan and can help you understand portion sizes and food labels. Why are these changes important?  It is possible to prevent or delay type 2 diabetes and related health problems by making lifestyle and nutrition changes.  It can be difficult to recognize signs of type 2 diabetes. The best way to avoid possible damage to your body is to take actions to prevent the disease before you develop symptoms. What can happen if changes are not made?  Your blood glucose levels may keep increasing. Having high blood glucose for a long time is dangerous. Too much glucose in your blood can damage your blood vessels, heart, kidneys, nerves, and eyes.  You may develop prediabetes or type 2 diabetes. Type 2 diabetes can lead to many chronic health problems and complications, such as: ? Heart disease. ? Stroke. ? Blindness. ? Kidney disease. ? Depression. ? Poor circulation in the feet and legs, which could lead to surgical removal (amputation) in severe cases. Where to find support:  Ask your health care provider to recommend a  registered dietitian, diabetes educator, or weight loss program.  Look for local or online weight loss groups.  Join a gym, fitness club, or outdoor activity group, such as a walking club. Where to find more information: To learn more about diabetes and diabetes prevention, visit:  American Diabetes Association (ADA): www.diabetes.[employer] of Diabetes and Digestive and Kidney Diseases: ToyArticles.ca  To learn more about healthy eating, visit:  The [place] Department of Agriculture Architect), Choose My Plate: http://yates.biz/  Office of Disease Prevention and Health Promotion (ODPHP), Dietary Guidelines: ListingMagazine.si  Aortic Valve Replacement, Care After Refer to this sheet in the next few weeks. These instructions provide you with information about caring for yourself after your procedure. Your health care provider may also give you more specific instructions. Your treatment has been planned according to current medical practices, but problems sometimes occur. Call your health care provider if you have any problems or questions after your procedure. What can I expect after the procedure? After the procedure, it is common to have:  Pain around your incision area.  A small amount of blood or clear fluid coming from your incision.  Follow these instructions at home: Eating and drinking   Follow instructions from your health care provider about eating or drinking restrictions. ? Limit alcohol intake to no more than 1 drink per day for nonpregnant women and 2 drinks per day for men. One drink equals 12 oz of beer, 5 oz of wine, or 1 oz of hard liquor. ? Limit how much caffeine you drink. Caffeine can affect your heart's rate and rhythm.  Drink enough fluid to keep your urine clear or pale yellow.  Eat a heart-healthy diet. This should include plenty of fresh fruits and vegetables. If you eat meat, it should  be lean cuts. Avoid foods that are: ? High in salt, saturated fat, or sugar. ? Canned or highly processed. ? Fried. Activity  Return to your normal activities as told by your health care provider. Ask your health care provider what activities are safe for you.  Exercise regularly once you have recovered, as told by your health care provider.  Avoid sitting for more than 2 hours at a time without moving. Get up  and move around at least once every 1-2 hours. This helps to prevent blood clots in the legs.  Do not lift anything that is heavier than 10 lb (4.5 kg) until your health care provider approves.  Avoid pushing or pulling things with your arms until your health care provider approves. This includes pulling on handrails to help you climb stairs. Incision care   Follow instructions from your health care provider about how to take care of your incision. Make sure you: ? Wash your hands with soap and water before you change your bandage (dressing). If soap and water are not available, use hand sanitizer. ? Change your dressing as told by your health care provider. ? Leave stitches (sutures), skin glue, or adhesive strips in place. These skin closures may need to stay in place for 2 weeks or longer. If adhesive strip edges start to loosen and curl up, you may trim the loose edges. Do not remove adhesive strips completely unless your health care provider tells you to do that.  Check your incision area every day for signs of infection. Check for: ? More redness, swelling, or pain. ? More fluid or blood. ? Warmth. ? Pus or a bad smell. Medicines  Take over-the-counter and prescription medicines only as told by your health care provider.  If you were prescribed an antibiotic medicine, take it as told by your health care provider. Do not stop taking the antibiotic even if you start to feel better. Travel  Avoid airplane travel for as long as told by your health care provider.  When you  travel, bring a list of your medicines and a record of your medical history with you. Carry your medicines with you. Driving  Ask your health care provider when it is safe for you to drive. Do not drive until your health care provider approves.  Do not drive or operate heavy machinery while taking prescription pain medicine. Lifestyle   Do not use any tobacco products, such as cigarettes, chewing tobacco, or e-cigarettes. If you need help quitting, ask your health care provider.  Resume sexual activity as told by your health care provider. Do not use medicines for erectile dysfunction unless your health care provider approves, if this applies.  Work with your health care provider to keep your blood pressure and cholesterol under control, and to manage any other heart conditions that you have.  Maintain a healthy weight. General instructions  Do not take baths, swim, or use a hot tub until your health care provider approves.  Do not strain to have a bowel movement.  Avoid crossing your legs while sitting down.  Check your temperature every day for a fever. A fever may be a sign of infection.  If you are a woman and you plan to become pregnant, talk with your health care provider before you become pregnant.  Wear compression stockings if your health care provider instructs you to do this. These stockings help to prevent blood clots and reduce swelling in your legs.  Tell all health care providers who care for you that you have an artificial (prosthetic) aortic valve. If you have or have had heart disease or endocarditis, tell all health care providers about these conditions as well.  Keep all follow-up visits as told by your health care provider. This is important. Contact a health care provider if:  You develop a skin rash.  You experience sudden, unexplained changes in your weight.  You have more redness, swelling, or pain around your  incision.  You have more fluid or blood  coming from your incision.  Your incision feels warm to the touch.  You have pus or a bad smell coming from your incision.  You have a fever. Get help right away if:  You develop chest pain that is different from the pain coming from your incision.  You develop shortness of breath or difficulty breathing.  You start to feel light-headed. These symptoms may represent a serious problem that is an emergency. Do not wait to see if the symptoms will go away. Get medical help right away. Call your local emergency services (911 in the [place] Do not drive yourself to the hospital. This information is not intended to replace advice given to you by your health care provider. Make sure you discuss any questions you have with your health care provider. Document Released: 03/12/2005 Document Revised: 01/30/2016 Document Reviewed: 07/28/2015 Elsevier Interactive Patient Education  2017 Elsevier Inc.  Summary  You can reduce your risk for type 2 diabetes by increasing your physical activity, eating healthy foods, and losing weight as directed.  Talk with your health care provider about your risk for type 2 diabetes. Ask about any blood tests or screening tests that you need to have. This information is not intended to replace advice given to you by your health care provider. Make sure you discuss any questions you have with your health care provider. Document Released: 12/16/2015 Document Revised: 01/30/2016 Document Reviewed: 10/15/2015 Elsevier Interactive Patient Education  [employer]2018 Elsevier Inc.

## 2017-12-25 NOTE — Progress Notes (Addendum)
      [address]       [place] [id number]             [phone]      5 Days Post-Op Procedure(s) (LRB): AORTIC VALVE REPLACEMENT (AVR) (N/A) TRANSESOPHAGEAL ECHOCARDIOGRAM (TEE) (N/A)   Subjective:  No complaints.  Feels great wants to go home today.  + ambulation  + BM  Objective: Vital signs in last 24 hours: Temp:  [97.9 F (36.6 C)-99 F (37.2 C)] 97.9 F (36.6 C) (04/20 0851) Pulse Rate:  [59-81] 59 (04/20 0546) Cardiac Rhythm: Heart block (04/20 0800) Resp:  [22-27] 27 (04/20 0851) BP: (126-150)/(56-80) 150/80 (04/20 0851) SpO2:  [92 %-100 %] 92 % (04/20 0546) Weight:  [197 lb 12.8 oz (89.7 kg)] 197 lb 12.8 oz (89.7 kg) (04/20 0546)  Intake/Output from previous day: 04/19 0701 - 04/20 0700 In: 120 [P.O.:120] Out: 700 [Urine:700]  General appearance: alert, cooperative and no distress Heart: regular rate and rhythm Lungs: clear to auscultation bilaterally Abdomen: soft, non-tender; bowel sounds normal; no masses,  no organomegaly Extremities: edema trace Wound: clean and dry  Lab Results: Recent Labs    12/24/17 0500 12/25/17 0259  WBC 8.8 7.5  HGB 11.0* 11.6*  HCT 34.4* 35.9*  PLT 188 242   BMET:  Recent Labs    12/24/17 0500 12/25/17 0259  NA 140 139  K 3.5 3.5  CL 105 102  CO2 26 28  GLUCOSE 118* 126*  BUN 18 17  CREATININE 0.84 0.95  CALCIUM 8.6* 9.0    PT/INR: No results for input(s): LABPROT, INR in the last 72 hours. ABG    Component Value Date/Time   PHART 7.331 (L) 12/20/2017 1859   HCO3 23.5 12/20/2017 1859   TCO2 26 12/22/2017 1604   ACIDBASEDEF 2.0 12/20/2017 1859   O2SAT 98.0 12/20/2017 1859   CBG (last 3)  Recent Labs    12/22/17 2338 12/23/17 0322 12/23/17 1212  GLUCAP 132* 111* 116*    Assessment/Plan: S/P Procedure(s) (LRB): AORTIC VALVE REPLACEMENT (AVR) (N/A) TRANSESOPHAGEAL ECHOCARDIOGRAM (TEE) (N/A)  1. CV- PAF, currently in NSR- continue Amiodarone, Lopressor, start Eliquis at  discharge 2. Pulm- no acute issues, off oxygen, continue IS 3. Renal- creatinine stable, weight is trending down, will transition to oral Lasix daily 4. Expected post operative blood loss anemia, stable 5. Dispo- patient stable, continues to have PAF rate controlled... Will d/c EPW today, plan is to start Eliquis at discharge, patient really wants to go home today, will discuss with staff   LOS: 5 days    Stephen Mullins 12/25/2017   I have seen and examined the patient and agree with the assessment and plan as outlined.  D/C home  Purcell Nailslarence H Jezabella Schriever, MD 12/25/2017 3:06 PM

## 2017-12-27 ENCOUNTER — Telehealth: Payer: Self-pay

## 2017-12-27 NOTE — Telephone Encounter (Signed)
Patient's wife called and stated that Stephen Mullins has had a sore throat, cold-like symptoms, and low grade fever (100) for the past 2 days.  She stated that he does not have a cough.  She was requesting advice to what he can take to help symptoms.  I advised her that he could take over-the-counter Coricidin HBP for management of symptoms.  Also, I advised to give us a call back if he develops a persistent cough or shortness of breath.  She acknowledged receipt.

## 2017-12-28 ENCOUNTER — Telehealth (HOSPITAL_COMMUNITY): Payer: Self-pay

## 2017-12-28 NOTE — Telephone Encounter (Signed)
Patients insurance is active and benefits verified through [employer] - $45.00 co-pay, no deductible, out of pocket amount of $4,200/$337.01 has been met, no co-insurance, and no pre-authorization is required. Passport/reference [id number]  Will contact patient to see if he is interested in the Cardiac Rehab Program. If interested, patient will need to complete follow up appt. Once completed, patient will be contacted for scheduling upon review by the RN Navigator.

## 2017-12-28 NOTE — Telephone Encounter (Signed)
Attempted to call patient to see if he is interested in the Cardiac Rehab Program - Lm on vm °

## 2018-01-03 ENCOUNTER — Telehealth (HOSPITAL_COMMUNITY): Payer: Self-pay

## 2018-01-03 NOTE — Telephone Encounter (Signed)
Patient returned phone call and stated he is interested in the Cardiac Rehab program. Martin Majestic over scheduling process with patient and he verbalized understanding. Went over insurance with patient and patient stated is he has not met his out of pocket amount he will have to re-think participating as the co-pay may be too expensive. I explained the Maintenance program to patient so he can think about that. Will recheck insurance close to orientation date once scheduled. Will call patient for scheduling once follow up appt has been completed.

## 2018-01-10 ENCOUNTER — Other Ambulatory Visit (INDEPENDENT_AMBULATORY_CARE_PROVIDER_SITE_OTHER): Payer: Medicare HMO | Admitting: *Deleted

## 2018-01-10 ENCOUNTER — Ambulatory Visit: Payer: Medicare HMO | Admitting: Nurse Practitioner

## 2018-01-10 ENCOUNTER — Encounter: Payer: Self-pay | Admitting: Nurse Practitioner

## 2018-01-10 VITALS — BP 160/90 | HR 62 | Ht 71.0 in | Wt 189.8 lb

## 2018-01-10 DIAGNOSIS — I480 Paroxysmal atrial fibrillation: Secondary | ICD-10-CM | POA: Diagnosis not present

## 2018-01-10 DIAGNOSIS — Z952 Presence of prosthetic heart valve: Secondary | ICD-10-CM | POA: Diagnosis not present

## 2018-01-10 DIAGNOSIS — R509 Fever, unspecified: Secondary | ICD-10-CM

## 2018-01-10 DIAGNOSIS — Z79899 Other long term (current) drug therapy: Secondary | ICD-10-CM | POA: Diagnosis not present

## 2018-01-10 DIAGNOSIS — Z953 Presence of xenogenic heart valve: Secondary | ICD-10-CM

## 2018-01-10 LAB — URINALYSIS
Bilirubin, UA: NEGATIVE
Glucose, UA: NEGATIVE
Ketones, UA: NEGATIVE
Leukocytes, UA: NEGATIVE
Nitrite, UA: NEGATIVE
Protein, UA: NEGATIVE
RBC, UA: NEGATIVE
Specific Gravity, UA: 1.021 (ref 1.005–1.030)
Urobilinogen, Ur: 0.2 mg/dL (ref 0.2–1.0)
pH, UA: 5 (ref 5.0–7.5)

## 2018-01-10 NOTE — Patient Instructions (Addendum)
We will be checking the following labs today - BMET, TSH, LFTs and CBC  UA if possible   Medication Instructions:    Continue with your current medicines. BUT  Cut the amiodarone back to 1/2 a tablet (200 mg) just once a day  Do not chew the potassium tablets - ok to dissolve as we talked about.     Testing/Procedures To Be Arranged:  Echocardiogram prior to next visit please.   Follow-Up:   See Dr. Anne Fu in 6 weeks.     Other Special Instructions:   I will send a note to cardiac rehab   Continue to monitor your temperature and BP at home.     If you need a refill on your cardiac medications before your next appointment, please call your pharmacy.   Call the [facility] office at [phone] if you have any questions, problems or concerns.

## 2018-01-10 NOTE — Progress Notes (Addendum)
CARDIOLOGY OFFICE NOTE  Date:  01/10/2018    Stephen Mullins Date of Birth: [birth year] Medical Record #[id number]  PCP:  [name], MD  Cardiologist:  [facility]    Chief Complaint  Patient presents with  . Aortic Stenosis  . Atrial Fibrillation    Post hospital visit - seen for Dr. Anne Fu    History of Present Illness: Stephen Mullins is a 72 y.o. male who presents today for a post hospital visit. Seen for Dr. Anne Fu.   He has a history of being an ex-smoker with long history of cardiac murmur. Most recent echo his gradient indicated severe aortic stenosis with a valve area of 0.7, peak gradient 75 mmHg, mean gradient 42 mmHg. There was also moderate-severe aortic insufficiency. Severe LVH was noted. Very mild DOE and basically without symptoms.  Had normal coronaries on coronary angiogram. Right heart cath pressures were normal - cardiac index was 2.1. He was referred for AVR with Dr. [name] - this was performed on 12/20/2017. Noted CTA of the chest (12/2017) with dilatation of the ascending aorta - measuring 4.4 cm in greatest diameter.   His post op course was uneventful. Did have AF. Placed on amiodarone. Eliquis started at discharge. Had ABL anemia as well - no transfusion.   Comes in today. Here with his wife. Having issues with not sleeping. He is dizzy with standing. BP is elevated here - he has been checking at home - says it has leveled out over the past 5 days or so - about 130/70. He has been chewing the potassium because of the size. He has had some low grade fevers - 100.2. No cough. No swelling. Sternum looks fine. Not using any pain medicine. No shortness of breath. Not using any pain medicine - no real chest pain. No palpitations. He has trouble with getting comfortable in the bed - sleeping in short increments but feels like he is getting rest.   Past Medical History:  Diagnosis Date  . Bicuspid aortic valve    with aortic stenosis and mild insufficiency,  mild to moderate aortic root dilitation  . Borderline hyperlipidemia   . Elevated fasting glucose   . Elevated PSA    due to prostatitis  . GERD (gastroesophageal reflux disease)   . Heart murmur   . Hypertension   . Nail fungus   . Thoracic ascending aortic aneurysm [facility])     Past Surgical History:  Procedure Laterality Date  . AORTIC VALVE REPLACEMENT N/A 12/20/2017   Procedure: AORTIC VALVE REPLACEMENT (AVR);  Surgeon: [name], [name], MD;  Location: [facility] OR;  Service: Open Heart Surgery;  Laterality: N/A;  . CATARACT EXTRACTION, BILATERAL    . RIGHT/LEFT HEART CATH AND CORONARY ANGIOGRAPHY N/A 11/09/2017   Procedure: RIGHT/LEFT HEART CATH AND CORONARY ANGIOGRAPHY;  Surgeon: [name], MD;  Location: [facility] INVASIVE CV LAB;  Service: Cardiovascular;  Laterality: N/A;  . TEE WITHOUT CARDIOVERSION N/A 12/20/2017   Procedure: TRANSESOPHAGEAL ECHOCARDIOGRAM (TEE);  Surgeon: [name], [name], MD;  Location: [facility] OR;  Service: Open Heart Surgery;  Laterality: N/A;     Medications: Current Meds  Medication Sig  . acetaminophen (TYLENOL) 500 MG tablet Take 2 tablets (1,000 mg total) by mouth every 6 (six) hours as needed.  [name] amiodarone (PACERONE) 200 MG tablet Take 200 mg by mouth 2 (two) times daily.  [name] apixaban (ELIQUIS) 5 MG TABS tablet Take 1 tablet (5 mg total) by mouth 2 (two) times daily.  [name]  aspirin 81 MG tablet Take 81 mg by mouth daily.  . hydrochlorothiazide (HYDRODIURIL) 12.5 MG tablet Take 12.5 mg by mouth daily.  . hydrocortisone cream 1 % Apply 1 application topically daily as needed for itching.  . metoprolol succinate (TOPROL-XL) 25 MG 24 hr tablet Take 25 mg by mouth daily.  [name] neomycin-bacitracin-polymyxin (NEOSPORIN) ointment Apply 1 application topically daily as needed for wound care.  [name] oxyCODONE (OXY IR/ROXICODONE) 5 MG immediate release tablet Take 1 tablet (5 mg total) by mouth every 4 (four) hours as needed for severe pain.  [name] oxymetazoline (AFRIN) 0.05 %  nasal spray Place 1 spray into both nostrils daily as needed for congestion.  . potassium chloride SA (K-DUR,KLOR-CON) 20 MEQ tablet Take 1 tablet (20 mEq total) by mouth daily.  . [DISCONTINUED] amiodarone (PACERONE) 100 MG tablet Take 100 mg by mouth 2 (two) times daily.     Allergies: Allergies  Allergen Reactions  . Tetanus Toxoid, Adsorbed Other (See Comments)    Pt. States as small child he had tetnus made from horse serum and he turned 'blue' ?? SHORTNESS OF BREATH ??    Social History: The patient  reports that he quit smoking about 16 years ago. He has never used smokeless tobacco. He reports that he drinks about 0.6 - 1.2 oz of alcohol per week. He reports that he does not use drugs.   Family History: The patient's family history includes Prostate cancer in his father.   Review of Systems: Please see the history of present illness.   Otherwise, the review of systems is positive for none.   All other systems are reviewed and negative.   Physical Exam: VS:  BP (!) 160/90 (BP Location: Left Arm, Patient Position: Sitting, Cuff Size: Normal)   Pulse 62   Ht  (1.803 m)   Wt 189 lb 12.8 oz (86.1 kg)   BMI 26.47 kg/m  .  BMI Body mass index is 26.47 kg/m.  Wt Readings from Last 3 Encounters:  01/10/18 189 lb 12.8 oz (86.1 kg)  12/25/17 197 lb 12.8 oz (89.7 kg)  12/16/17 199 lb 12.8 oz (90.6 kg)   Repeat BP by me is 130/80  General: Pleasant. Well developed, well nourished and in no acute distress.   HEENT: Normal.  Neck: Supple, no JVD, carotid bruits, or masses noted.  Cardiac: Regular rate and rhythm. Very soft outflow murmur. Sternum looks fine. No edema.  Respiratory:  Lungs are clear to auscultation bilaterally with normal work of breathing.  GI: Soft and nontender.  MS: No deformity or atrophy. Gait and ROM intact.  Skin: Warm and dry. Color is normal.  Neuro:  Strength and sensation are intact and no gross focal deficits noted.  Psych: Alert,  appropriate and with normal affect.   LABORATORY DATA:  EKG:  EKG is ordered today. This demonstrates NSR with LVH.  Lab Results  Component Value Date   WBC 7.5 12/25/2017   HGB 11.6 (L) 12/25/2017   HCT 35.9 (L) 12/25/2017   PLT 242 12/25/2017   GLUCOSE 126 (H) 12/25/2017   ALT 13 (L) 12/23/2017   AST 24 12/23/2017   NA 139 12/25/2017   K 3.5 12/25/2017   CL 102 12/25/2017   CREATININE 0.95 12/25/2017   BUN 17 12/25/2017   CO2 28 12/25/2017   INR 1.49 12/20/2017   HGBA1C 5.8 (H) 12/16/2017     BNP (last 3 results) No results for input(s): BNP in the last 8760 hours.  ProBNP (  last 3 results) No results for input(s): PROBNP in the last 8760 hours.   Other Studies Reviewed Today:  Procedure (s):  Aortic valve replacement with a 25-mm Edwards pericardial tissue valve - Inspiris, serial [id number] by Dr. [name] 12/20/2017.   TEE Study Conclusions 12/20/2017  - Left ventricle: The cavity size was normal. Wall thickness was   normal. Systolic function was normal. The estimated ejection   fraction was in the range of 60% to 65%. Wall motion was normal;   there were no regional wall motion abnormalities. - Aortic valve: Normal-sized, noncalcified annulus. Bicuspid;   moderately thickened leaflets; fusion of the right-left coronary   commissure. Cusp separation was severely reduced. Right coronary   and left coronary cusp immobility was noted. Transvalvular   velocity was increased, 448 cm/s, due to severe stenosis. Peak   gradient 80 mmHg, mean gradient 46 mmHg. AVA (VTI) 1.14 cm2.   There was moderate to severe regurgitation directed towards the   mitral anterior leaflet. - Mitral valve: Normal-sized, noncalcified annulus. Normal   thickness leaflets . Mobility of the posterior leaflet was very   mildly restricted. Transvalvular velocity was within the normal   range. There was no evidence for stenosis. There was mild   regurgitation directed centrally. - Staged  echo: Limited Post CPB exam: Good LV function post bypass.   EF 50-55%, with no wall motion abnormalities despite pacing.   Prosthetic aortic valve is well-seated in the Aortic annulus. No   AI seen in LV outflow tract. Post aortic valve replacement images   demonstrate no residual valvular insufficiency or perivalvular   leak. The mean gradient acorss the new aoric valve is 9 mmHg,   with a peak of 17 mmHg. No change post bypass in mitral valve   function, mild MR remains.  Impressions:  - Post aortic valve replacement surgery, the prosthetic aortic   valve appears to be functioning normally. Excellent prosthetic   aortic valve function without evidence for perivalvular leak.   Other valves unchanged. No other significant changes from   pre-bypass images   Cardiac Cath Conclusion 11/2017    Prox RCA lesion is 30% stenosed.  Mid RCA lesion is 30% stenosed.  RPDA lesion is 30% stenosed.  Ost Cx to Mid Cx lesion is 20% stenosed.  Ost 1st Mrg lesion is 20% stenosed.  Mid LAD lesion is 20% stenosed.  Ost LM lesion is 20% stenosed.   1. Mild non-obstructive CAD 2. Severe aortic stenosis (mean gradient 35.6 mmHg, peak to peak gradient 36 mmHg, AVA 0.95 cm2).  Recommendations: Continue workup for AVR.    PreCabg Doppler 12/2017 Final Interpretation: Right Carotid: Velocities in the right ICA are consistent with a 1-39% stenosis.  Left Carotid: Velocities in the left ICA are consistent with a 1-39% stenosis. Vertebrals: Bilateral vertebral arteries demonstrate antegrade flow.  Electronically signed by Leonides Sake on 12/16/2017 at 4:28:08 PM.  CTA CHEST IMPRESSION 12/2017: Heavily calcified aortic valve with associated mild aneurysmal dilatation of the ascending thoracic aorta measuring 4.4 cm in greatest diameter.  Aortic aneurysm NOS (ICD10-I71.9).   Electronically Signed   By: Irish Lack M.D.   On: 12/16/2017 16:39     Assessment & Plan:  1.  Severe AS - s/p AVR - he is doing well clinically - very mild transient dizziness. Recheck of his BP is ok by me.  Sternum looks fine. Some low grade fever noted - will check lab and UA today. He is interested in cardiac  rehab - may only do for a few weeks due to the copay. Will get his echo updated in the next few weeks. He is reminded about SBE.   2. Post of AF - remains in NSR - will cut the amiodarone back to just 200 mg a day. Lab today. Do not suspect that this will be long term.   3. Post op anemia - rechecking lab today.   4. HTN - recheck by me is ok - 130/80. I have asked him to continue to monitor.   5. Mild carotid disease - recommend repeat study in 1 to 2 years.   6. High risk medicine - no problems with the Eliquis - doubt this will be long term as well. Does need some baseline lab with the amiodarone.   7. Ascending thoracic aortic aneurysm - measuring 4.4 cm in the greatest diameter - this will need following going forward - most likely by Dr. [name].    Current medicines are reviewed with the patient today.  The patient does not have concerns regarding medicines other than what has been noted above.  The following changes have been made:  See above.  Labs/ tests ordered today include:    Orders Placed This Encounter  Procedures  . Basic metabolic panel  . CBC  . Hepatic function panel  . TSH  . Urinalysis  . ECHOCARDIOGRAM COMPLETE     Disposition:   FU with Dr. Anne Fu in 6 weeks.    Patient is agreeable to this plan and will call if any problems develop in the interim.   SignedNorma Fredrickson, NP  01/10/2018 12:35 PM  [facility] [address] [place], [place]  [address] Phone: [phone] Fax: [phone]

## 2018-01-11 ENCOUNTER — Telehealth (HOSPITAL_COMMUNITY): Payer: Self-pay | Admitting: *Deleted

## 2018-01-11 DIAGNOSIS — Z952 Presence of prosthetic heart valve: Secondary | ICD-10-CM | POA: Diagnosis not present

## 2018-01-11 DIAGNOSIS — H6590 Unspecified nonsuppurative otitis media, unspecified ear: Secondary | ICD-10-CM | POA: Diagnosis not present

## 2018-01-11 DIAGNOSIS — R42 Dizziness and giddiness: Secondary | ICD-10-CM | POA: Diagnosis not present

## 2018-01-11 LAB — HEPATIC FUNCTION PANEL
ALT: 24 IU/L (ref 0–44)
AST: 21 IU/L (ref 0–40)
Albumin: 4.5 g/dL (ref 3.5–4.8)
Alkaline Phosphatase: 91 IU/L (ref 39–117)
Bilirubin Total: 0.4 mg/dL (ref 0.0–1.2)
Bilirubin, Direct: 0.11 mg/dL (ref 0.00–0.40)
Total Protein: 7.6 g/dL (ref 6.0–8.5)

## 2018-01-11 LAB — TSH: TSH: 5.22 u[IU]/mL — ABNORMAL HIGH (ref 0.450–4.500)

## 2018-01-11 LAB — BASIC METABOLIC PANEL
BUN/Creatinine Ratio: 14 (ref 10–24)
BUN: 15 mg/dL (ref 8–27)
CO2: 26 mmol/L (ref 20–29)
Calcium: 10.4 mg/dL — ABNORMAL HIGH (ref 8.6–10.2)
Chloride: 98 mmol/L (ref 96–106)
Creatinine, Ser: 1.06 mg/dL (ref 0.76–1.27)
GFR calc Af Amer: 81 mL/min/{1.73_m2} (ref >59)
GFR calc non Af Amer: 70 mL/min/{1.73_m2} (ref >59)
Glucose: 96 mg/dL (ref 65–99)
Potassium: 4.7 mmol/L (ref 3.5–5.2)
Sodium: 141 mmol/L (ref 134–144)

## 2018-01-11 LAB — CBC
Hematocrit: 43.9 % (ref 37.5–51.0)
Hemoglobin: 14.5 g/dL (ref 13.0–17.7)
MCH: 28.8 pg (ref 26.6–33.0)
MCHC: 33 g/dL (ref 31.5–35.7)
MCV: 87 fL (ref 79–97)
Platelets: 356 10*3/uL (ref 150–379)
RBC: 5.03 x10E6/uL (ref 4.14–5.80)
RDW: 13.5 % (ref 12.3–15.4)
WBC: 8.3 10*3/uL (ref 3.4–10.8)

## 2018-01-11 NOTE — Telephone Encounter (Signed)
-----   Message from Jake Bathe, MD sent at 01/11/2018  7:14 AM EDT ----- Regarding: RE: Thoracic aneurysm Parameters I would follow normal protocol. No special restrictions. In general would avoid heavy abrupt lifting exercises which are not performed in cardiac rehab.   Donato Schultz, MD  ----- Message ----- From: Chelsea Aus, RN Sent: 01/10/2018  10:13 PM To: Jake Bathe, MD, Rosalio Macadamia, NP Subject: Thoracic aneurysm Parameters                    The above patient seen today in follow up from AV Replacement on 4/15. Noted in his history 4.4 Thoracic aneurysm. This was noted during pre-op testing.  What is an acceptable BP at rest and on exertion? Any limitations or restrictions to observe in cardiac rehab?  Any equipment that should be avoided?  Thanks for your input and guidance Pharmacist, hospital, BSN Cardiac and Pulmonary Rehab Nurse Navigator

## 2018-01-11 NOTE — Telephone Encounter (Signed)
-----   Message from Rosalio Macadamia, NP sent at 01/11/2018  1:07 PM EDT ----- Regarding: FW: Thoracic aneurysm Parameters Would avoid any activity that results in a "valsalva maneuver". Need BP at goal - under 130/85.   lori ----- Message ----- From: Chelsea Aus, RN Sent: 01/10/2018  10:13 PM To: Jake Bathe, MD, Rosalio Macadamia, NP Subject: Thoracic aneurysm Parameters                    The above patient seen today in follow up from AV Replacement on 4/15. Noted in his history 4.4 Thoracic aneurysm. This was noted during pre-op testing.  What is an acceptable BP at rest and on exertion? Any limitations or restrictions to observe in cardiac rehab?  Any equipment that should be avoided?  Thanks for your input and guidance Pharmacist, hospital, BSN Cardiac and Pulmonary Rehab Nurse Navigator

## 2018-01-25 ENCOUNTER — Ambulatory Visit (HOSPITAL_COMMUNITY): Payer: Medicare HMO | Attending: Cardiovascular Disease

## 2018-01-25 ENCOUNTER — Other Ambulatory Visit: Payer: Self-pay

## 2018-01-25 DIAGNOSIS — I359 Nonrheumatic aortic valve disorder, unspecified: Secondary | ICD-10-CM | POA: Diagnosis present

## 2018-01-25 DIAGNOSIS — R011 Cardiac murmur, unspecified: Secondary | ICD-10-CM | POA: Insufficient documentation

## 2018-01-25 DIAGNOSIS — Q231 Congenital insufficiency of aortic valve: Secondary | ICD-10-CM | POA: Diagnosis not present

## 2018-01-25 DIAGNOSIS — I10 Essential (primary) hypertension: Secondary | ICD-10-CM | POA: Diagnosis not present

## 2018-01-25 DIAGNOSIS — Z48812 Encounter for surgical aftercare following surgery on the circulatory system: Secondary | ICD-10-CM | POA: Diagnosis not present

## 2018-01-25 DIAGNOSIS — Z952 Presence of prosthetic heart valve: Secondary | ICD-10-CM

## 2018-02-01 ENCOUNTER — Other Ambulatory Visit: Payer: Self-pay | Admitting: Cardiothoracic Surgery

## 2018-02-01 DIAGNOSIS — Z953 Presence of xenogenic heart valve: Secondary | ICD-10-CM

## 2018-02-02 ENCOUNTER — Ambulatory Visit (INDEPENDENT_AMBULATORY_CARE_PROVIDER_SITE_OTHER): Payer: Self-pay | Admitting: Cardiothoracic Surgery

## 2018-02-02 ENCOUNTER — Ambulatory Visit
Admission: RE | Admit: 2018-02-02 | Discharge: 2018-02-02 | Disposition: A | Discharge status: Home / Self Care | Payer: Medicare HMO | Source: Ambulatory Visit | Attending: Cardiothoracic Surgery | Admitting: Cardiothoracic Surgery

## 2018-02-02 ENCOUNTER — Encounter: Payer: Self-pay | Admitting: Cardiothoracic Surgery

## 2018-02-02 ENCOUNTER — Other Ambulatory Visit: Payer: Self-pay

## 2018-02-02 VITALS — BP 132/80 | HR 57 | Resp 18 | Ht 71.0 in | Wt 189.6 lb

## 2018-02-02 DIAGNOSIS — Z953 Presence of xenogenic heart valve: Secondary | ICD-10-CM

## 2018-02-02 DIAGNOSIS — Z954 Presence of other heart-valve replacement: Secondary | ICD-10-CM | POA: Diagnosis not present

## 2018-02-02 IMAGING — DX DG CHEST 2V
2 series · 2 of 2 positions shown · non-contrast
Comparison: [DATE] and [DATE].

CLINICAL DATA: Postop aortic valve replacement [DATE]. No
current complaints.

EXAM:
CHEST - 2 VIEW

[dg chest 2 view (1 of 2)]
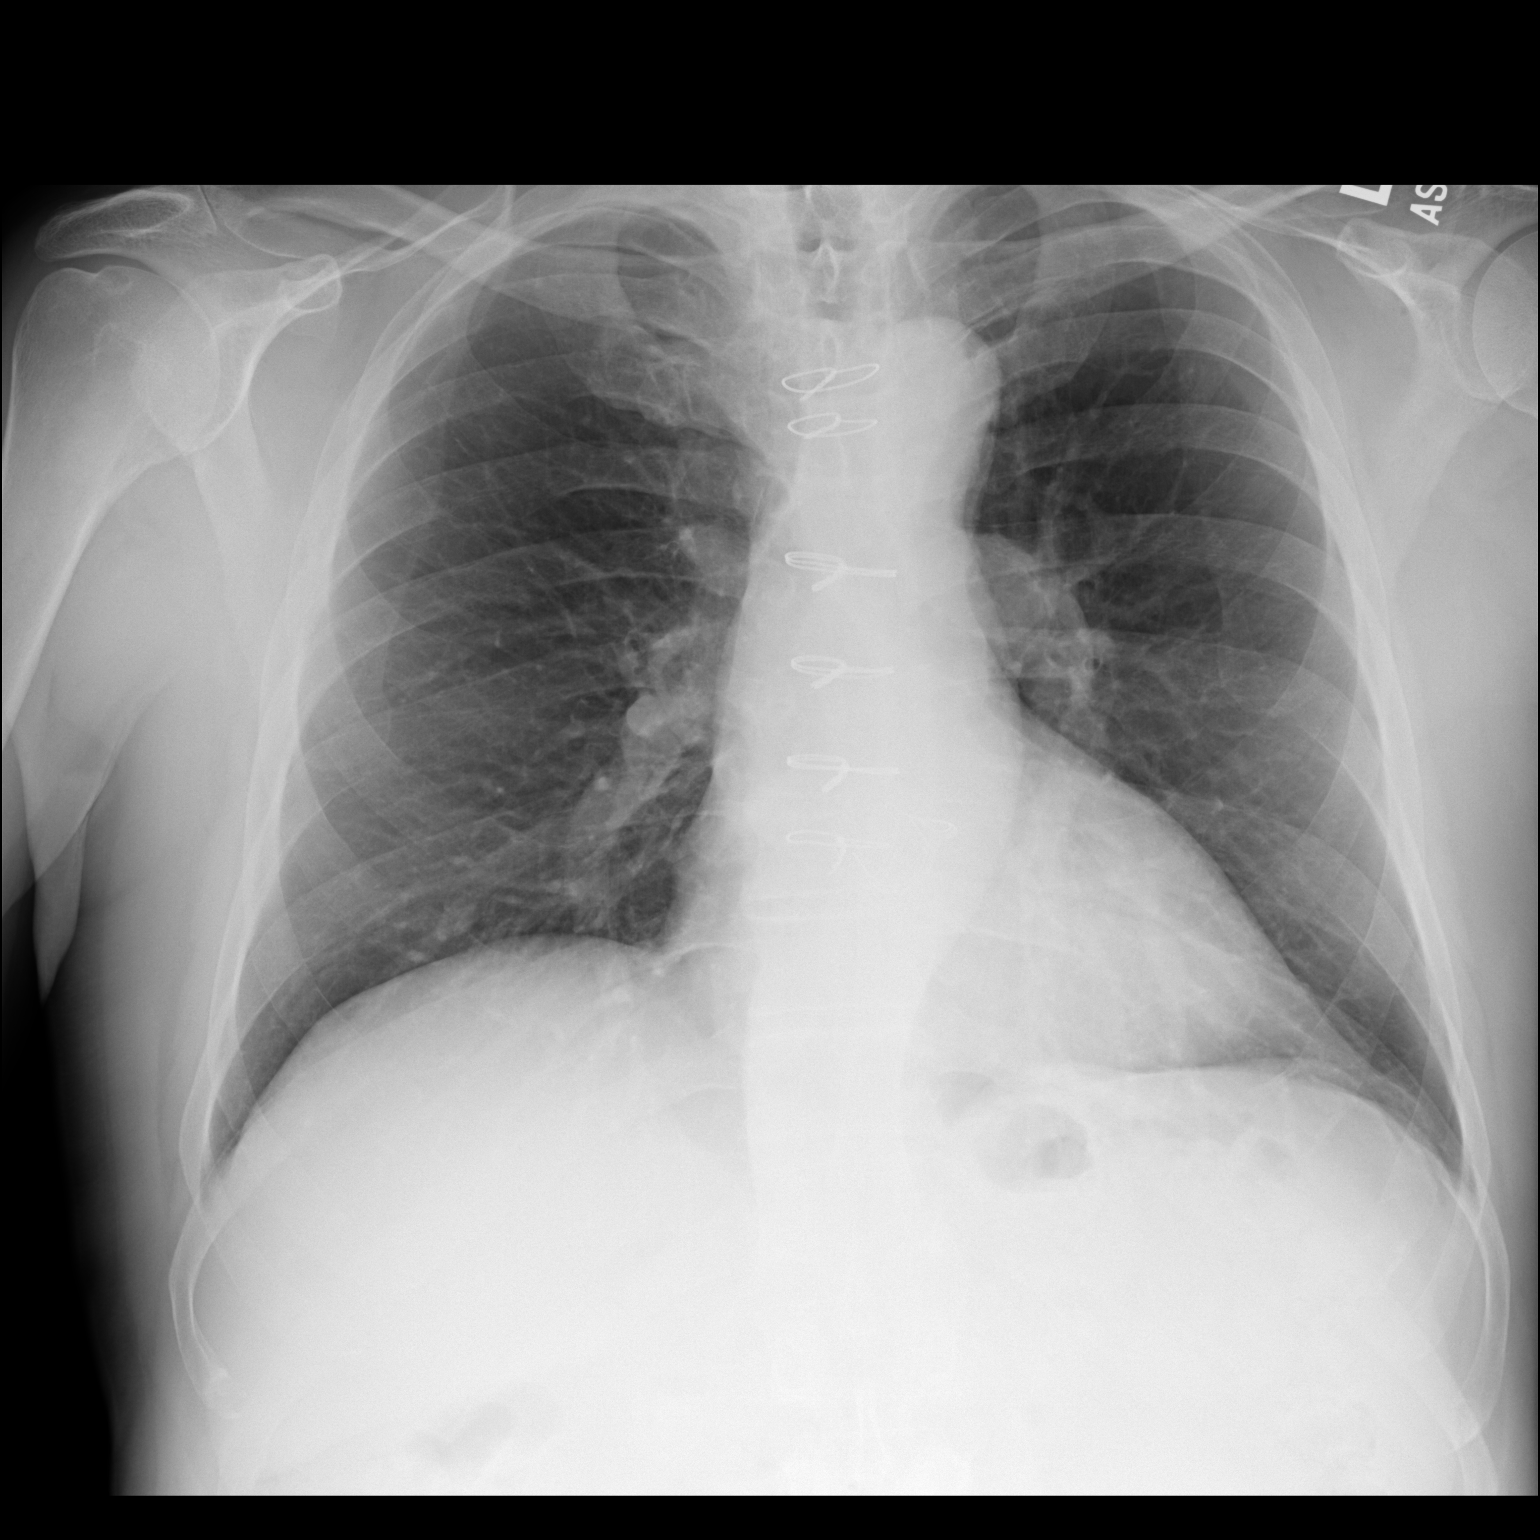

[dg chest 2 view (2 of 2)]
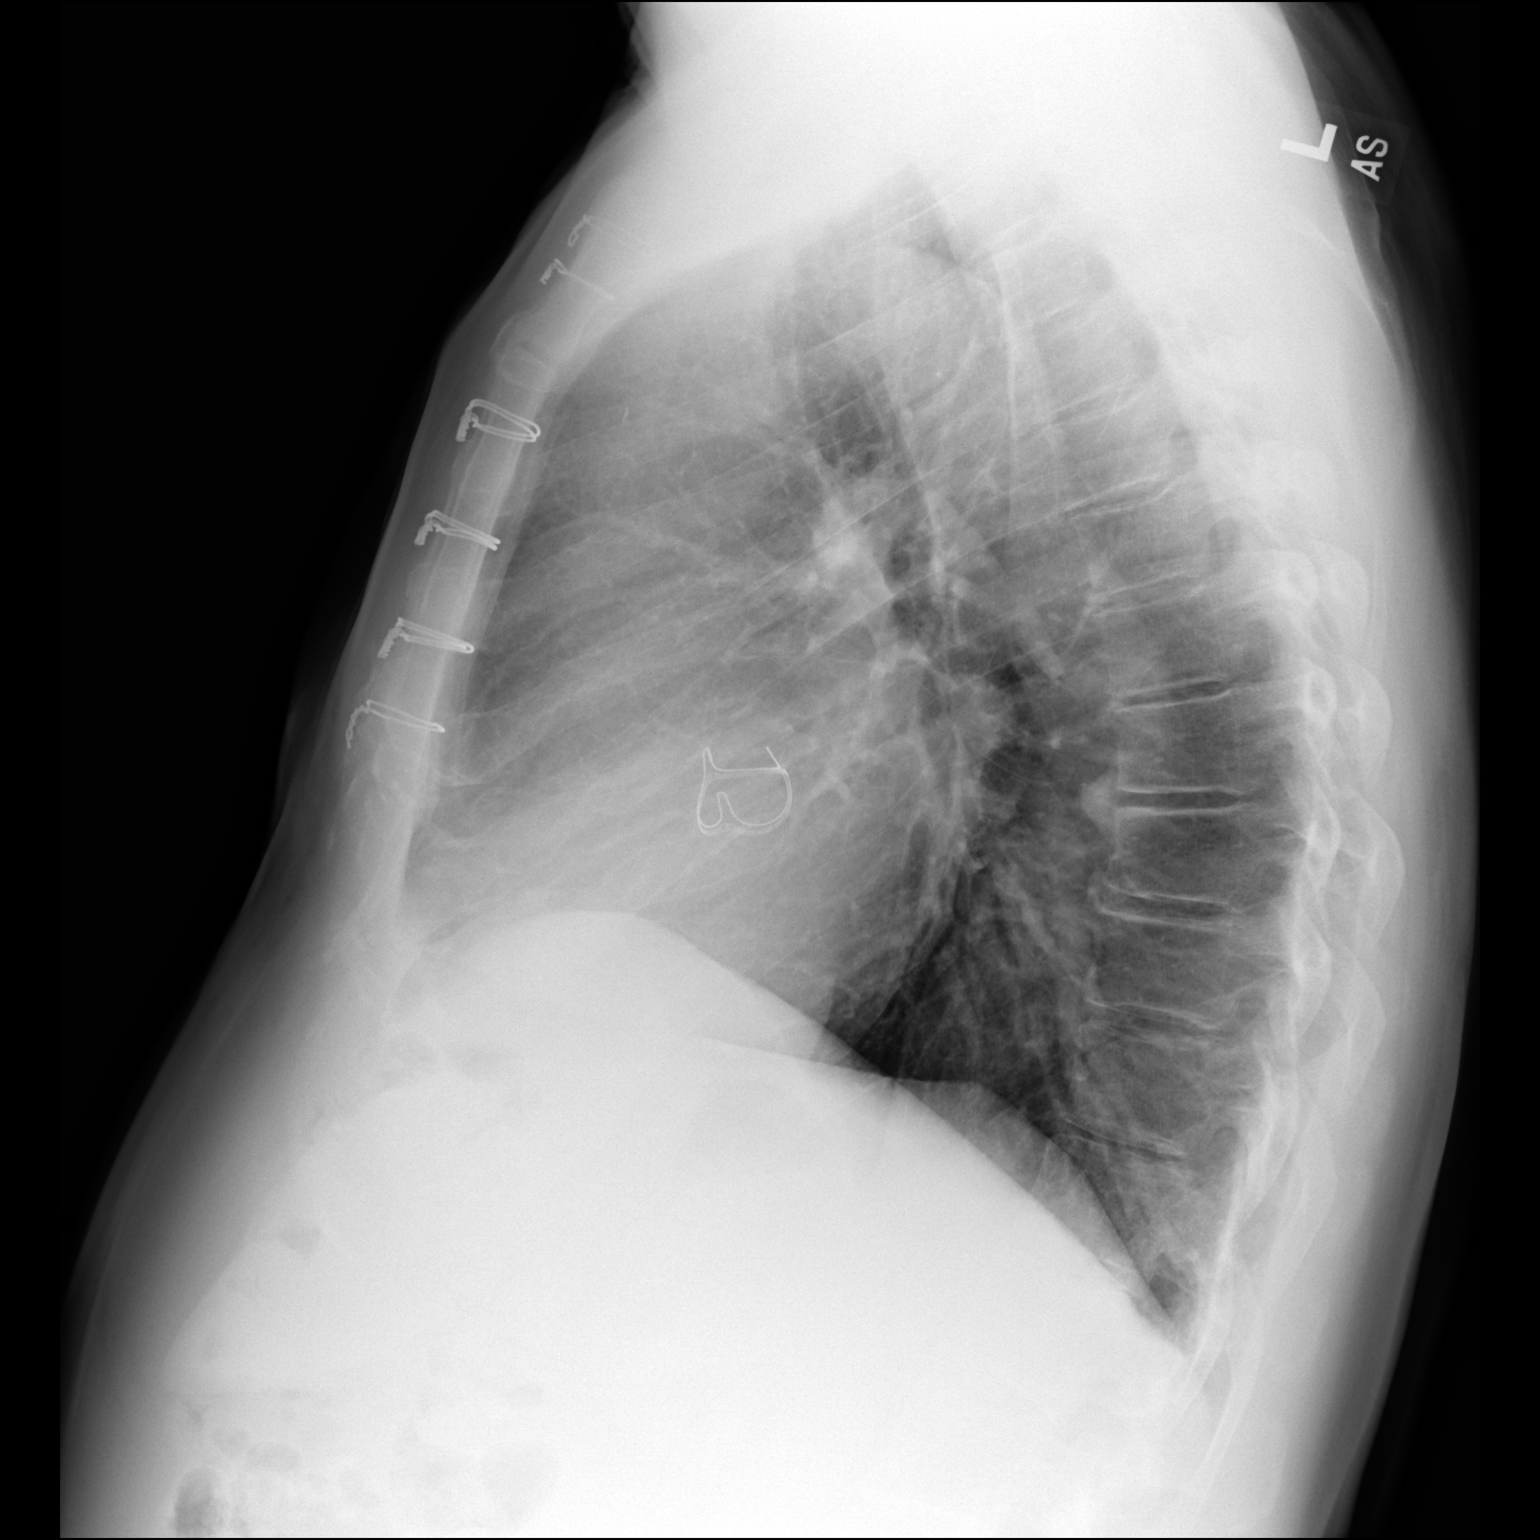

[2 of 2 positions shown; findings below may reference images not displayed]

FINDINGS: Right IJ sheath has been removed. There is improved aeration of the
lungs with resolution of the previously demonstrated bibasilar
atelectasis. There is no edema, confluent airspace opacity, pleural
effusion or pneumothorax. The heart size and mediastinal contours
are stable status post median sternotomy and aortic valve
replacement.
IMPRESSION: Interval clearing of the lung bases. No acute cardiopulmonary
process.

## 2018-02-02 NOTE — Progress Notes (Signed)
PCP is [name], MD Referring Provider is Jake Bathe, MD  Chief Complaint  Patient presents with  . Aortic Stenosis    f/u with chest xray, s/p AVR 12/20/2017    HPI: First postop visit 6 weeks after aortic valve replacement for severe aortic stenosis. Patient had a 25 mm bioprosthetic valve placed.  Patient has done well since surgery.  His chest x-ray today is clear.  A postop echocardiogram showed normal LV function, normal valvular function without aortic insufficiency and a mean gradient of 10 mmHg.  He had transient atrial fibrillation postop and was discharged home on amiodarone and Eliquis.  He has maintained sinus rhythm since discharge and he will finish his current supply of amiodarone and Eliquis then stop.  He will remain on aspirin and Toprol. 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  This has  been very stable-the ascending aorta measured 4.2 cm in 2010.  We will continue to follow this in the future.  Past Medical History:  Diagnosis Date  . 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 Borderline hyperlipidemia   . Elevated fasting glucose   . Elevated PSA    due to prostatitis  . GERD (gastroesophageal reflux disease)   . Heart murmur   . Hypertension   . Nail fungus   . Thoracic ascending aortic aneurysm [facility])     Past Surgical History:  Procedure Laterality Date  . AORTIC VALVE REPLACEMENT N/A 12/20/2017   Procedure: AORTIC VALVE REPLACEMENT (AVR);  Surgeon: [name], [name], MD;  Location: [facility] OR;  Service: Open Heart Surgery;  Laterality: N/A;  . CATARACT EXTRACTION, BILATERAL    . RIGHT/LEFT HEART CATH AND CORONARY ANGIOGRAPHY N/A 11/09/2017   Procedure: RIGHT/LEFT HEART CATH AND CORONARY ANGIOGRAPHY;  Surgeon: [name], MD;  Location: [facility] INVASIVE CV LAB;  Service: Cardiovascular;  Laterality: N/A;  . TEE WITHOUT  CARDIOVERSION N/A 12/20/2017   Procedure: TRANSESOPHAGEAL ECHOCARDIOGRAM (TEE);  Surgeon: [name], [name], MD;  Location: [facility] OR;  Service: Open Heart Surgery;  Laterality: N/A;    Family History  Problem Relation Age of Onset  . Prostate cancer Father     Social History Social History   Tobacco Use  . Smoking status: Former Smoker    Last attempt to quit: 10/20/2001    Years since quitting: 16.2  . Smokeless tobacco: Never Used  Substance Use Topics  . Alcohol use: Yes    Alcohol/week: 0.6 - 1.2 oz    Types: 1 - 2 Glasses of wine per week  . Drug use: No    Current Outpatient Medications  Medication Sig Dispense Refill  . acetaminophen (TYLENOL) 500 MG tablet Take 2 tablets (1,000 mg total) by mouth every 6 (six) hours as needed. 30 tablet 0  . amiodarone (PACERONE) 200 MG tablet Take 200 mg by mouth 2 (two) times daily.     [name] apixaban (ELIQUIS) 5 MG TABS tablet Take 1 tablet (5 mg total) by mouth 2 (two) times daily. 60 tablet 1  . aspirin 81 MG tablet Take 81 mg by mouth daily.    . hydrochlorothiazide (HYDRODIURIL) 12.5 MG tablet Take 12.5 mg by mouth daily.    . hydrocortisone cream 1 % Apply 1 application topically daily as needed for itching.    . metoprolol succinate (TOPROL-XL) 25 MG 24 hr tablet Take 25 mg by  mouth daily.    [name] neomycin-bacitracin-polymyxin (NEOSPORIN) ointment Apply 1 application topically daily as needed for wound care.    [name] oxyCODONE (OXY IR/ROXICODONE) 5 MG immediate release tablet Take 1 tablet (5 mg total) by mouth every 4 (four) hours as needed for severe pain. 30 tablet 0  . oxymetazoline (AFRIN) 0.05 % nasal spray Place 1 spray into both nostrils daily as needed for congestion.    . potassium chloride SA (K-DUR,KLOR-CON) 20 MEQ tablet Take 1 tablet (20 mEq total) by mouth daily. 30 tablet 3   No current facility-administered medications for this visit.     Allergies  Allergen Reactions  . Tetanus Toxoid, Adsorbed Other (See Comments)    Pt.  States as small child he had tetnus made from horse serum and he turned 'blue' ?? SHORTNESS OF BREATH ??    Review of Systems  Insomnia and appetite are improving Orthostatic dizziness is improving Patient is anxious to return to his preop activities including golfing but he understands that full swing golf will need to be delayed for 3 months after surgery.  BP 132/80 (BP Location: Right Arm, Patient Position: Sitting, Cuff Size: Normal)   Pulse (!) 57   Resp 18   Ht  (1.803 m)   Wt 189 lb 9.6 oz (86 kg)   SpO2 96% Comment: RA  BMI 26.44 kg/m  Physical Exam      Exam    General- alert and comfortable    Neck- no JVD, no cervical adenopathy palpable, no carotid bruit   Lungs- clear without rales, wheezes.  Sternal incision well-healed.   Cor- regular rate and rhythm, no murmur , gallop   Abdomen- soft, non-tender   Extremities - warm, non-tender, minimal edema   Neuro- oriented, appropriate, no focal weakness   Diagnostic Tests: Chest x-ray clear. Recent echocardiogram images independently reviewed showing normal aortic valve function and normal LV function.  No significant pericardial effusion Impression: Excellent early recovery after AVR for aortic stenosis.  Mild stable ascending aortic dilatation was left intact.  Plan: Patient may drive and participate in normal daily activities but avoid lifting more than 15-20 pounds until his next visit.  We discussed the importance of antibiotic prophylaxis prior to dental procedures.  He will have a dental  cleaning session this late summer and was given prescription for amoxicillin.  I will see him back in 6 weeks to review his progress.  He is cleared to start cardiac rehab.   Mikey Bussing, MD [facility] [phone]

## 2018-02-16 ENCOUNTER — Telehealth (HOSPITAL_COMMUNITY): Payer: Self-pay | Admitting: Pharmacist

## 2018-02-16 NOTE — Telephone Encounter (Signed)
Cardiac Rehab Medication Review by a Pharmacist  Does the patient  feel that his/her medications are working for him/her?  yes  Has the patient been experiencing any side effects to the medications prescribed?  Yes - dizziness, trouble sleeping right after surgery, got better with time  Does the patient measure his/her own blood pressure or blood glucose at home?  yes - 132/72  Does the patient have any problems obtaining medications due to transportation or finances?   yes  Understanding of regimen: good Understanding of indications: excellent Potential of compliance: good   Pharmacist comments: Mr. Stephen Mullins is a pleasant 72 y/o male who endorses compliance with medications s/p aortic valve replacement on 4/15. He reports Dr. Donata ClayVan Trigt had discontinued his amiodarone, Eliquis and potassium at his follow up visit in late May (see below), so these were removed from his medication list. He also denies side effects currently, although he did experience dizziness and trouble sleeping right after surgery.   Per. Dr. Zenaida NieceVan Tright's office visit 5/29: "He had transient atrial fibrillation postop and was discharged home on amiodarone and Eliquis.  He has maintained sinus rhythm since discharge and he will finish his current supply of amiodarone and Eliquis then stop.  He will remain on aspirin and Toprol."   [name], PharmD PGY1 Pharmacy Resident Phone: [phone] Email: [email or web address] 02/16/2018 3:40 PM

## 2018-02-18 ENCOUNTER — Telehealth (HOSPITAL_COMMUNITY): Payer: Self-pay | Admitting: Cardiac Rehabilitation

## 2018-02-18 NOTE — Telephone Encounter (Signed)
-----   Message from Kerin PernaPeter Van Trigt, MD sent at 02/17/2018  6:21 PM EDT ----- Regarding: RE: cardiac rehab  Nothing more than sternal precautions for activity.  Blood pressure with exercise less than 160 ----- Message ----- From: Robyne Peersion, Joann H, RN Sent: 02/16/2018   3:49 PM To: Kerin PernaPeter Van Trigt, MD Subject: cardiac rehab                                  Dear Dr. Maren BeachVanTrigt,  Pt has 4.4cm  thoracic aortic aneurysm.  Pt is scheduled to begin cardiac rehab.  Are there any activity restrictions?   Please indicate BP parameters and weight lifting parameters.    Thank you, Deveron FurlongJoann Rion, RN, BSN Cardiac Pulmonary Rehab

## 2018-02-22 ENCOUNTER — Encounter (HOSPITAL_COMMUNITY): Payer: Self-pay

## 2018-02-22 ENCOUNTER — Encounter (HOSPITAL_COMMUNITY)
Admission: RE | Admit: 2018-02-22 | Discharge: 2018-02-22 | Disposition: A | Discharge status: Home / Self Care | Payer: Medicare HMO | Source: Ambulatory Visit | Attending: Cardiology | Admitting: Cardiology

## 2018-02-22 VITALS — BP 144/82 | HR 60 | Ht 71.5 in | Wt 191.6 lb

## 2018-02-22 DIAGNOSIS — Z7952 Long term (current) use of systemic steroids: Secondary | ICD-10-CM | POA: Insufficient documentation

## 2018-02-22 DIAGNOSIS — Z7982 Long term (current) use of aspirin: Secondary | ICD-10-CM | POA: Diagnosis not present

## 2018-02-22 DIAGNOSIS — Z79891 Long term (current) use of opiate analgesic: Secondary | ICD-10-CM | POA: Insufficient documentation

## 2018-02-22 DIAGNOSIS — Z952 Presence of prosthetic heart valve: Secondary | ICD-10-CM | POA: Insufficient documentation

## 2018-02-22 DIAGNOSIS — Z87891 Personal history of nicotine dependence: Secondary | ICD-10-CM | POA: Insufficient documentation

## 2018-02-22 DIAGNOSIS — I712 Thoracic aortic aneurysm, without rupture: Secondary | ICD-10-CM | POA: Diagnosis not present

## 2018-02-22 DIAGNOSIS — Z79899 Other long term (current) drug therapy: Secondary | ICD-10-CM | POA: Diagnosis not present

## 2018-02-22 DIAGNOSIS — I10 Essential (primary) hypertension: Secondary | ICD-10-CM | POA: Diagnosis not present

## 2018-02-22 NOTE — Progress Notes (Signed)
Cardiac Individual Treatment Plan  Patient Details  Name: Stephen LivelyDavid E Mullins MRN: [id number] Date of Birth: 1946-02-26 Referring Provider:   Flowsheet Row CARDIAC REHAB PHASE II ORIENTATION from 02/22/2018 [facility]  Referring Provider  Donato SchultzSkains, Mark, MD.      Initial Encounter Date:  Flowsheet Row CARDIAC REHAB PHASE II ORIENTATION [facility] 02[facility]22/2018 [facility]  Date  02/22/18  Referring Provider  Donato SchultzSkains, Mark, MD.      Visit Diagnosis: S/P AVR (aortic valve replacement)  Patient's Home Medications on Admission:  Current Outpatient Medications:  .  acetaminophen (TYLENOL) 500 MG tablet, Take 2 tablets (1,000 mg total) by mouth every 6 (six) hours as needed., Disp: 30 tablet, Rfl: 0 .  aspirin 81 MG tablet, Take 81 mg by mouth daily., Disp: , Rfl:  .  hydrochlorothiazide (HYDRODIURIL) 12.5 MG tablet, Take 12.5 mg by mouth daily., Disp: , Rfl:  .  hydrocortisone cream 1 %, Apply 1 application topically daily as needed for itching., Disp: , Rfl:  .  metoprolol succinate (TOPROL-XL) 25 MG 24 hr tablet, Take 25 mg by mouth daily., Disp: , Rfl:  .  neomycin-bacitracin-polymyxin (NEOSPORIN) ointment, Apply 1 application topically daily as needed for wound care., Disp: , Rfl:  .  oxyCODONE (OXY IR/ROXICODONE) 5 MG immediate release tablet, Take 1 tablet (5 mg total) by mouth every 4 (four) hours as needed for severe pain. (Patient not taking: Reported on 02/16/2018), Disp: 30 tablet, Rfl: 0 .  oxymetazoline (AFRIN) 0.05 % nasal spray, Place 1 spray into both nostrils daily as needed for congestion., Disp: , Rfl:   Past Medical History: Past Medical History:  Diagnosis Date  . 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 Borderline hyperlipidemia   . Elevated fasting glucose   . Elevated PSA    due to prostatitis  . GERD (gastroesophageal reflux disease)    . Heart murmur   . Hypertension   . Nail fungus   . Thoracic ascending aortic aneurysm (HCC)     Tobacco Use: Social History   Tobacco Use  Smoking Status Former Smoker  . Last attempt to quit: 10/20/2001  . Years since quitting: 16.3  Smokeless Tobacco Never Used    Labs: Recent Review Flowsheet Data    Labs for ITP Cardiac and Pulmonary Rehab Latest Ref Rng & Units 12/20/2017 12/20/2017 12/20/2017 12/21/2017 12/22/2017   Hemoglobin A1c 4.8 - 5.6 % - - - - -   PHART 7.350 - 7.450 7.347(L) - 7.331(L) - -   PCO2ART 32.0 - 48.0 mmHg 44.4 - 45.3 - -   HCO3 20.0 - 28.0 mmol/L 24.1 - 23.5 - -   TCO2 22 - 32 mmol/L 25 25 25 23 26    ACIDBASEDEF 0.0 - 2.0 mmol/L 1.0 - 2.0 - -   O2SAT % 98.0 - 98.0 - -      Capillary Blood Glucose: Lab Results  Component Value Date   GLUCAP 116 (H) 12/23/2017   GLUCAP 111 (H) 12/23/2017   GLUCAP 132 (H) 12/22/2017   GLUCAP 114 (H) 12/22/2017   GLUCAP 131 (H) 12/22/2017     Exercise Target Goals: Date: 02/22/18  Exercise Program Goal: Individual exercise prescription set using results from initial 6 min walk test and THRR while considering  patient's activity barriers and safety.   Exercise Prescription Goal: Initial exercise prescription builds to 30-45 minutes a day of  aerobic activity, 2-3 days per week.  Home exercise guidelines will be given to patient during program as part of exercise prescription that the participant will acknowledge.  Activity Barriers & Risk Stratification: Activity Barriers & Cardiac Risk Stratification - 02/22/18 0847    Activity Barriers & Cardiac Risk Stratification          Activity Barriers  Deconditioning;Muscular Weakness;Incisional Pain    Cardiac Risk Stratification  High           6 Minute Walk: 6 Minute Walk    6 Minute Walk    Row Name 02/22/18 0828   Phase  Initial   Distance  1498 feet   Walk Time  6 minutes   # of Rest Breaks  0   MPH  2.84   METS  3.09   RPE  11   VO2 Peak  10.82    Symptoms  No   Resting HR  60 bpm   Resting BP  144/82   Resting Oxygen Saturation   95 %   Exercise Oxygen Saturation  during 6 min walk  95 %   Max Ex. HR  73 bpm   Max Ex. BP  144/84   2 Minute Post BP  120/60          Oxygen Initial Assessment:   Oxygen Re-Evaluation:   Oxygen Discharge (Final Oxygen Re-Evaluation):   Initial Exercise Prescription: Initial Exercise Prescription - 02/22/18 1100    Date of Initial Exercise RX and Referring Provider          Date  02/22/18    Referring Provider  Donato Schultz, MD.        Treadmill          MPH  2.7    Grade  0    Minutes  10    METs  3.07        Bike          Level  1    Minutes  10    METs  3.16        NuStep          Level  3    SPM  85    Minutes  10    METs  3        Prescription Details          Frequency (times per week)  3    Duration  Progress to 30 minutes of continuous aerobic without signs/symptoms of physical distress        Intensity          THRR 40-80% of Max Heartrate  59-118    Ratings of Perceived Exertion  11-13    Perceived Dyspnea  0-4        Progression          Progression  Continue to progress workloads to maintain intensity without signs/symptoms of physical distress.        Resistance Training          Training Prescription  Yes    Weight  3lbs    Reps  10-15           Perform Capillary Blood Glucose checks as needed.  Exercise Prescription Changes:   Exercise Comments:   Exercise Goals and Review: Exercise Goals    Exercise Goals    Row Name 02/22/18 0847   Increase Physical Activity  Yes   Intervention  Provide advice, education, support and counseling about physical activity/exercise needs.;Develop  an individualized exercise prescription for aerobic and resistive training based on initial evaluation findings, risk stratification, comorbidities and participant's personal goals.   Expected Outcomes  Short Term: Attend rehab on a regular basis to  increase amount of physical activity.;Long Term: Exercising regularly at least 3-5 days a week.;Long Term: Add in home exercise to make exercise part of routine and to increase amount of physical activity.   Increase Strength and Stamina  Yes return to gym routine and increase muscle tone   Intervention  Provide advice, education, support and counseling about physical activity/exercise needs.;Develop an individualized exercise prescription for aerobic and resistive training based on initial evaluation findings, risk stratification, comorbidities and participant's personal goals.   Expected Outcomes  Short Term: Perform resistance training exercises routinely during rehab and add in resistance training at home;Short Term: Increase workloads from initial exercise prescription for resistance, speed, and METs.;Long Term: Improve cardiorespiratory fitness, muscular endurance and strength as measured by increased METs and functional capacity ( )   Able to understand and use rate of perceived exertion (RPE) scale  Yes   Intervention  Provide education and explanation on how to use RPE scale   Expected Outcomes  Short Term: Able to use RPE daily in rehab to express subjective intensity level;Long Term:  Able to use RPE to guide intensity level when exercising independently   Knowledge and understanding of Target Heart Rate Range (THRR)  Yes   Intervention  Provide education and explanation of THRR including how the numbers were predicted and where they are located for reference   Expected Outcomes  Short Term: Able to state/look up THRR;Long Term: Able to use THRR to govern intensity when exercising independently;Short Term: Able to use daily as guideline for intensity in rehab   Able to check pulse independently  Yes   Intervention  Provide education and demonstration on how to check pulse in carotid and radial arteries.;Review the importance of being able to check your own pulse for safety during independent  exercise   Expected Outcomes  Short Term: Able to explain why pulse checking is important during independent exercise;Long Term: Able to check pulse independently and accurately   Understanding of Exercise Prescription  Yes   Intervention  Provide education, explanation, and written materials on patient's individual exercise prescription   Expected Outcomes  Short Term: Able to explain program exercise prescription;Long Term: Able to explain home exercise prescription to exercise independently          Exercise Goals Re-Evaluation :    Discharge Exercise Prescription (Final Exercise Prescription Changes):   Nutrition:  Target Goals: Understanding of nutrition guidelines, daily intake of sodium 1500mg , cholesterol 200mg , calories 30% from fat and 7% or less from saturated fats, daily to have 5 or more servings of fruits and vegetables.  Biometrics: Pre Biometrics - 02/22/18 0828    Pre Biometrics          Height  5' 11.5" (1.816 m)    Weight  191 lb 9.3 oz (86.9 kg)    Waist Circumference  41.75 inches    Hip Circumference  39.75 inches    Waist to Hip Ratio  1.05 %    BMI (Calculated)  26.35    Triceps Skinfold  17 mm    % Body Fat  28.2 %    Grip Strength  39 kg    Flexibility  10 in    Single Leg Stand  12.91 seconds  Nutrition Therapy Plan and Nutrition Goals:   Nutrition Assessments:   Nutrition Goals Re-Evaluation:   Nutrition Goals Re-Evaluation:   Nutrition Goals Discharge (Final Nutrition Goals Re-Evaluation):   Psychosocial: Target Goals: Acknowledge presence or absence of significant depression and/or stress, maximize coping skills, provide positive support system. Participant is able to verbalize types and ability to use techniques and skills needed for reducing stress and depression.  Initial Review & Psychosocial Screening: Initial Psych Review & Screening - 02/22/18 1031    Initial Review          Current issues with  None  Identified        Family Dynamics          Good Support System?  Yes spouse, family         Barriers          Psychosocial barriers to participate in program  There are no identifiable barriers or psychosocial needs.        Screening Interventions          Interventions  Encouraged to exercise           Quality of Life Scores: Quality of Life - 02/22/18 1032    Quality of Life Scores          Health/Function Pre  28.1 %    Socioeconomic Pre  29.5 %    Psych/Spiritual Pre  28.5 %    Family Pre  29.5 %    GLOBAL Pre  28.6 %          Scores of 19 and below usually indicate a poorer quality of life in these areas.  A difference of  2-3 points is a clinically meaningful difference.  A difference of 2-3 points in the total score of the Quality of Life Index has been associated with significant improvement in overall quality of life, self-image, physical symptoms, and general health in studies assessing change in quality of life.  PHQ-9: Recent Review Flowsheet Data    There is no flowsheet data to display.     Interpretation of Total Score  Total Score Depression Severity:  1-4 = Minimal depression, 5-9 = Mild depression, 10-14 = Moderate depression, 15-19 = Moderately severe depression, 20-27 = Severe depression   Psychosocial Evaluation and Intervention:   Psychosocial Re-Evaluation:   Psychosocial Discharge (Final Psychosocial Re-Evaluation):   Vocational Rehabilitation: Provide vocational rehab assistance to qualifying candidates.   Vocational Rehab Evaluation & Intervention: Vocational Rehab - 02/22/18 1031    Initial Vocational Rehab Evaluation & Intervention          Assessment shows need for Vocational Rehabilitation  No           Education: Education Goals: Education classes will be provided on a weekly basis, covering required topics. Participant will state understanding/return demonstration of topics presented.  Learning  Barriers/Preferences: Learning Barriers/Preferences - 02/22/18 0847    Learning Barriers/Preferences          Learning Barriers  Sight    Learning Preferences  Written Material;Verbal Instruction;Skilled Demonstration           Education Topics: Count Your Pulse:  -Group instruction provided by verbal instruction, demonstration, patient participation and written materials to support subject.  Instructors address importance of being able to find your pulse and how to count your pulse when at home without a heart monitor.  Patients get hands on experience counting their pulse with staff help and individually.   Heart Attack, Angina, and Risk Factor Modification:  -  Group instruction provided by verbal instruction, video, and written materials to support subject.  Instructors address signs and symptoms of angina and heart attacks.    Also discuss risk factors for heart disease and how to make changes to improve heart health risk factors.   Functional Fitness:  -Group instruction provided by verbal instruction, demonstration, patient participation, and written materials to support subject.  Instructors address safety measures for doing things around the house.  Discuss how to get up and down off the floor, how to pick things up properly, how to safely get out of a chair without assistance, and balance training.   Meditation and Mindfulness:  -Group instruction provided by verbal instruction, patient participation, and written materials to support subject.  Instructor addresses importance of mindfulness and meditation practice to help reduce stress and improve awareness.  Instructor also leads participants through a meditation exercise.    Stretching for Flexibility and Mobility:  -Group instruction provided by verbal instruction, patient participation, and written materials to support subject.  Instructors lead participants through series of stretches that are designed to increase flexibility  thus improving mobility.  These stretches are additional exercise for major muscle groups that are typically performed during regular warm up and cool down.   Hands Only CPR:  -Group verbal, video, and participation provides a basic overview of AHA guidelines for community CPR. Role-play of emergencies allow participants the opportunity to practice calling for help and chest compression technique with discussion of AED use.   Hypertension: -Group verbal and written instruction that provides a basic overview of hypertension including the most recent diagnostic guidelines, risk factor reduction with self-care instructions and medication management.    Nutrition I class: Heart Healthy Eating:  -Group instruction provided by PowerPoint slides, verbal discussion, and written materials to support subject matter. The instructor gives an explanation and review of the Therapeutic Lifestyle Changes diet recommendations, which includes a discussion on lipid goals, dietary fat, sodium, fiber, plant stanol/sterol esters, sugar, and the components of a well-balanced, healthy diet.   Nutrition II class: Lifestyle Skills:  -Group instruction provided by PowerPoint slides, verbal discussion, and written materials to support subject matter. The instructor gives an explanation and review of label reading, grocery shopping for heart health, heart healthy recipe modifications, and ways to make healthier choices when eating out.   Diabetes Question & Answer:  -Group instruction provided by PowerPoint slides, verbal discussion, and written materials to support subject matter. The instructor gives an explanation and review of diabetes co-morbidities, pre- and post-prandial blood glucose goals, pre-exercise blood glucose goals, signs, symptoms, and treatment of hypoglycemia and hyperglycemia, and foot care basics.   Diabetes Blitz:  -Group instruction provided by PowerPoint slides, verbal discussion, and written  materials to support subject matter. The instructor gives an explanation and review of the physiology behind type 1 and type 2 diabetes, diabetes medications and rational behind using different medications, pre- and post-prandial blood glucose recommendations and Hemoglobin A1c goals, diabetes diet, and exercise including blood glucose guidelines for exercising safely.    Portion Distortion:  -Group instruction provided by PowerPoint slides, verbal discussion, written materials, and food models to support subject matter. The instructor gives an explanation of serving size versus portion size, changes in portions sizes over the last 20 years, and what consists of a serving from each food group.   Stress Management:  -Group instruction provided by verbal instruction, video, and written materials to support subject matter.  Instructors review role of stress in heart disease  and how to cope with stress positively.     Exercising on Your Own:  -Group instruction provided by verbal instruction, power point, and written materials to support subject.  Instructors discuss benefits of exercise, components of exercise, frequency and intensity of exercise, and end points for exercise.  Also discuss use of nitroglycerin and activating EMS.  Review options of places to exercise outside of rehab.  Review guidelines for sex with heart disease.   Cardiac Drugs I:  -Group instruction provided by verbal instruction and written materials to support subject.  Instructor reviews cardiac drug classes: antiplatelets, anticoagulants, beta blockers, and statins.  Instructor discusses reasons, side effects, and lifestyle considerations for each drug class.   Cardiac Drugs II:  -Group instruction provided by verbal instruction and written materials to support subject.  Instructor reviews cardiac drug classes: angiotensin converting enzyme inhibitors (ACE-I), angiotensin II receptor blockers (ARBs), nitrates, and calcium  channel blockers.  Instructor discusses reasons, side effects, and lifestyle considerations for each drug class.   Anatomy and Physiology of the Circulatory System:  Group verbal and written instruction and models provide basic cardiac anatomy and physiology, with the coronary electrical and arterial systems. Review of: AMI, Angina, Valve disease, Heart Failure, Peripheral Artery Disease, Cardiac Arrhythmia, Pacemakers, and the ICD.   Other Education:  -Group or individual verbal, written, or video instructions that support the educational goals of the cardiac rehab program.   Holiday Eating Survival Tips:  -Group instruction provided by PowerPoint slides, verbal discussion, and written materials to support subject matter. The instructor gives patients tips, tricks, and techniques to help them not only survive but enjoy the holidays despite the onslaught of food that accompanies the holidays.   Knowledge Questionnaire Score: Knowledge Questionnaire Score - 02/22/18 1030    Knowledge Questionnaire Score          Pre Score  19/24           Core Components/Risk Factors/Patient Goals at Admission: Personal Goals and Risk Factors at Admission - 02/22/18 0811    Core Components/Risk Factors/Patient Goals on Admission          Hypertension  Yes    Intervention  Provide education on lifestyle modifcations including regular physical activity/exercise, weight management, moderate sodium restriction and increased consumption of fresh fruit, vegetables, and low fat dairy, alcohol moderation, and smoking cessation.;Monitor prescription use compliance.    Expected Outcomes  Short Term: Continued assessment and intervention until BP is < 140/87mm HG in hypertensive participants. < 130/73mm HG in hypertensive participants with diabetes, heart failure or chronic kidney disease.;Long Term: Maintenance of blood pressure at goal levels.    Lipids  Yes    Intervention  Provide education and support for  participant on nutrition & aerobic/resistive exercise along with prescribed medications to achieve LDL 70mg , HDL >40mg .    Expected Outcomes  Short Term: Participant states understanding of desired cholesterol values and is compliant with medications prescribed. Participant is following exercise prescription and nutrition guidelines.;Long Term: Cholesterol controlled with medications as prescribed, with individualized exercise RX and with personalized nutrition plan. Value goals: LDL < 70mg , HDL > 40 mg.           Core Components/Risk Factors/Patient Goals Review:    Core Components/Risk Factors/Patient Goals at Discharge (Final Review):    ITP Comments: ITP Comments    Row Name 02/21/18 1209   ITP Comments  Dr. Armanda Magic, Medical Director      Comments: Patient attended orientation from [phone] to [phone] to  review rules and guidelines for program. Completed 6 minute walk test, Intitial ITP, and exercise prescription.  VSS. Telemetry-sinus rhythm, non specific ST-T wave changes,  Asymptomatic. Deveron Furlong, RN, BSN Cardiac Pulmonary Rehab 02/22/18 11:54 AM

## 2018-02-22 NOTE — Progress Notes (Signed)
Stephen LivelyDavid E Mullins 72 y.o. male DOB: [birth year] MRN: [id number]      Nutrition Note  1. S/P AVR (aortic valve replacement)    Past Medical History:  Diagnosis Date  . 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 Borderline hyperlipidemia   . Elevated fasting glucose   . Elevated PSA    due to prostatitis  . GERD (gastroesophageal reflux disease)   . Heart murmur   . Hypertension   . Nail fungus   . Thoracic ascending aortic aneurysm (HCC)    Meds reviewed. toprol noted  HT: Ht Readings from Last 1 Encounters:  02/22/18 5' 11.5" (1.816 m)    WT: Wt Readings from Last 5 Encounters:  02/22/18 191 lb 9.3 oz (86.9 kg)  02/02/18 189 lb 9.6 oz (86 kg)  01/10/18 189 lb 12.8 oz (86.1 kg)  12/25/17 197 lb 12.8 oz (89.7 kg)  12/16/17 199 lb 12.8 oz (90.6 kg)     Body mass index is 26.35 kg/m.   Current tobacco use? No  Labs:  Lipid Panel  No results found for: CHOL, TRIG, HDL, CHOLHDL, VLDL, LDLCALC, LDLDIRECT  Lab Results  Component Value Date   HGBA1C 5.8 (H) 12/16/2017   CBG (last 3)  No results for input(s): GLUCAP in the last 72 hours.  Nutrition Note Spoke with pt. Nutrition plan and goals reviewed with pt. Pt is not following heart healthy diet. Pt wants to maintain his weight and would like to focus on eating an overall healthy diet. Pt is prediabetic, will educate patient on foods that affect his blood glucose and discuss eating consistent carbohydrates across the day, as well as educate on whole grains. Pt expressed understanding of the information reviewed. Pt aware of nutrition education classes offered.  Nutrition Diagnosis ? Food-and nutrition-related knowledge deficit related to lack of exposure to information as related to diagnosis of: ? CVD ? Pre-DM ? Overweight related to excessive energy intake as evidenced by a Body mass index is 26.35 kg/m.  Nutrition Intervention ? Pt's individual  nutrition plan and goals reviewed with pt.  Nutrition Goal(s):   ? Pt to identify and limit food sources of saturated fat, trans fat, and sodium ? Pt able to name foods that affect blood glucose   Plan:   Will provide client-centered nutrition education as part of interdisciplinary care.   Monitor and evaluate progress toward nutrition goal with team.  Ross MarcusAubrey Burklin, MS, RD, LDN 02/22/2018 3:15 PM

## 2018-02-25 ENCOUNTER — Ambulatory Visit: Payer: Medicare HMO | Admitting: Cardiology

## 2018-02-25 ENCOUNTER — Encounter: Payer: Self-pay | Admitting: Cardiology

## 2018-02-25 VITALS — BP 154/84 | HR 58 | Ht 71.0 in | Wt 191.8 lb

## 2018-02-25 DIAGNOSIS — I480 Paroxysmal atrial fibrillation: Secondary | ICD-10-CM | POA: Diagnosis not present

## 2018-02-25 DIAGNOSIS — I10 Essential (primary) hypertension: Secondary | ICD-10-CM | POA: Diagnosis not present

## 2018-02-25 DIAGNOSIS — Z953 Presence of xenogenic heart valve: Secondary | ICD-10-CM

## 2018-02-25 MED ORDER — HYDROCHLOROTHIAZIDE 25 MG PO TABS: 25.0000 mg | ORAL_TABLET | Freq: Every day | ORAL | 3 refills | Status: DC

## 2018-02-25 MED ORDER — HYDROCHLOROTHIAZIDE 12.5 MG PO TABS: 12.5000 mg | ORAL_TABLET | Freq: Every day | ORAL | 3 refills | Status: DC

## 2018-02-25 NOTE — Patient Instructions (Addendum)
Medication Instructions:  The current medical regimen is effective;  continue present plan and medications.  Follow-Up: Follow up in 6 months with Lori Gerhardt, NP.  You will receive a letter in the mail 2 months before you are due.  Please call us when you receive this letter to schedule your follow up appointment.  Follow up in 1 year with Dr. Skains.  You will receive a letter in the mail 2 months before you are due.  Please call us when you receive this letter to schedule your follow up appointment.  If you need a refill on your cardiac medications before your next appointment, please call your pharmacy.  Thank you for choosing [facility]!!     

## 2018-02-25 NOTE — Progress Notes (Signed)
Cardiology Office Note    Date:  02/25/2018   ID:  Stephen Mullins, DOB Sep 17, 1945, MRN [id number]  PCP:  [name], MD  Cardiologist:   Donato Schultz, MD     History of Present Illness:  Stephen Mullins is a 72 y.o. male with aortic valve replacement, Dr. [name] performed on 12/20/2017 using 25 mm bioprosthetic valve.  Postoperatively, normal LV function was noted, good overall gradient across valve of 10 mmHg, excellent.  Postoperatively he had transient atrial fibrillation and he was on both amiodarone and Eliquis but he has been in sinus rhythm since discharge in both are stopped.  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  Bicuspid aortic valve previously seen in 2010, CT angiogram performed at that time of aorta, no coarctation. 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  BP 140/60, 64 prior to surgery. Now 145/76 in am, 132/69, pulse 59.   Rare dizzy every week. Off balance. 6 steps out of car, stopped, gathered himself.  This usually happens when getting up from a seated position sleeping well. Mild soreness at site.   Prior to surgery he was been noticing some increased shortness of breath when going upstairs, for instance that is beach house when getting up to the top after carrying the groceries, he is winded.  No chest pain, no syncope, no history of endocarditis.  He takes good care of himself.  Nonsmoker (quit in 2003).  Retired. Enjoying retired life.    Past Medical History:  Diagnosis Date  . 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 Borderline hyperlipidemia   . Elevated fasting glucose   . Elevated PSA    due to prostatitis  . GERD (gastroesophageal reflux disease)   . Heart murmur   . Hypertension   . Nail fungus   . Thoracic ascending aortic aneurysm [facility])     Past Surgical History:  Procedure Laterality Date  . AORTIC VALVE REPLACEMENT N/A  12/20/2017   Procedure: AORTIC VALVE REPLACEMENT (AVR);  Surgeon: [name], [name], MD;  Location: [facility] OR;  Service: Open Heart Surgery;  Laterality: N/A;  . CATARACT EXTRACTION, BILATERAL    . RIGHT/LEFT HEART CATH AND CORONARY ANGIOGRAPHY N/A 11/09/2017   Procedure: RIGHT/LEFT HEART CATH AND CORONARY ANGIOGRAPHY;  Surgeon: [name], MD;  Location: [facility] INVASIVE CV LAB;  Service: Cardiovascular;  Laterality: N/A;  . TEE WITHOUT CARDIOVERSION N/A 12/20/2017   Procedure: TRANSESOPHAGEAL ECHOCARDIOGRAM (TEE);  Surgeon: [name], [name], MD;  Location: [facility] OR;  Service: Open Heart Surgery;  Laterality: N/A;    Outpatient Medications Prior to Visit  Medication Sig Dispense Refill  . acetaminophen (TYLENOL) 500 MG tablet Take 2 tablets (1,000 mg total) by mouth every 6 (six) hours as needed. 30 tablet 0  . aspirin 81 MG tablet Take 81 mg by mouth daily.    . hydrocortisone cream 1 % Apply 1 application topically daily as needed for itching.    . metoprolol succinate (TOPROL-XL) 25 MG 24 hr tablet Take 25 mg by mouth daily.    [name] neomycin-bacitracin-polymyxin (NEOSPORIN) ointment Apply 1 application topically daily as needed for wound care.    [name] oxyCODONE (OXY IR/ROXICODONE) 5 MG immediate release tablet Take 1 tablet (5 mg total) by mouth every 4 (four) hours as needed for severe pain. 30 tablet 0  . oxymetazoline (AFRIN) 0.05 % nasal spray Place 1 spray  into both nostrils daily as needed for congestion.    . hydrochlorothiazide (HYDRODIURIL) 12.5 MG tablet Take 12.5 mg by mouth daily.     No facility-administered medications prior to visit.      Allergies:   Tetanus toxoid, adsorbed   Social History   Socioeconomic History  . Marital status: Married    Spouse name: Not on file  . Number of children: Not on file  . Years of education: Not on file  . Highest education level: Not on file  Occupational History  . Occupation: retired  Engineer, production  . Financial resource strain: Not on  file  . Food insecurity:    Worry: Not on file    Inability: Not on file  . Transportation needs:    Medical: Not on file    Non-medical: Not on file  Tobacco Use  . Smoking status: Former Smoker    Last attempt to quit: 10/20/2001    Years since quitting: 16.3  . Smokeless tobacco: Never Used  Substance and Sexual Activity  . Alcohol use: Yes    Alcohol/week: 0.6 - 1.2 oz    Types: 1 - 2 Glasses of wine per week  . Drug use: No  . Sexual activity: Not on file  Lifestyle  . Physical activity:    Days per week: Not on file    Minutes per session: Not on file  . Stress: Not on file  Relationships  . Social connections:    Talks on phone: Not on file    Gets together: Not on file    Attends religious service: Not on file    Active member of club or organization: Not on file    Attends meetings of clubs or organizations: Not on file    Relationship status: Not on file  Other Topics Concern  . Not on file  Social History Narrative  . Not on file     Family History:  The patient's family history includes Prostate cancer in his father.   ROS:   Please see the history of present illness.   All other ROS negative   PHYSICAL EXAM:   VS:  Ht 5\' 11"  (1.803 m)   Wt 191 lb 12.8 oz (87 kg)   SpO2 96%   BMI 26.75 kg/m    GEN: Well nourished, well developed, in no acute distress  HEENT: normal  Neck: no JVD, carotid bruits, or masses Cardiac: RRR; 1/6 SM, rubs, or gallops,no edema  Respiratory:  clear to auscultation bilaterally, normal work of breathing GI: soft, nontender, nondistended, + BS MS: no deformity or atrophy  Skin: warm and dry, no rash Neuro:  Alert and Oriented x 3, Strength and sensation are intact Psych: euthymic mood, full affect   Wt Readings from Last 3 Encounters:  02/25/18 191 lb 12.8 oz (87 kg)  02/22/18 191 lb 9.3 oz (86.9 kg)  02/02/18 189 lb 9.6 oz (86 kg)      Studies/Labs Reviewed:   EKG:  EKG is ordered today.  11/04/17-sinus bradycardia  rate 57, LVH with repolarization abnormality personally reviewed-prior 10/25/15-sinus bradycardia rate 51 with nonspecific ST-T wave changes, LVH.  Recent Labs: 12/21/2017: Magnesium 2.1 01/10/2018: ALT 24; BUN 15; Creatinine, Ser 1.06; Hemoglobin 14.5; Platelets 356; Potassium 4.7; Sodium 141; TSH 5.220   Lipid Panel No results found for: CHOL, TRIG, HDL, CHOLHDL, VLDL, LDLCALC, LDLDIRECT   Creatinine 0.9, ALT 28, LDL 144, HDL 40, hemoglobin A1c 6.1  Additional studies/ records that were reviewed today  include:  Prior ecg, labs, office note reviewed  Echocardiogram 01/25/2018: - Assuring bioprosthetic aortic valve, normal pump function   ASSESSMENT:    1. S/P aortic valve replacement with bioprosthetic valve   2. Paroxysmal atrial fibrillation (HCC)   3. Essential (primary) hypertension      PLAN:  In order of problems listed above:  Aortic valve replacement -25 mm bioprosthetic valve, Dr. Maren BeachVanTrigt.  Doing well.  Cardiac rehab will be starting soon.  He states that he may go about 6 sessions then go back to his gym.  Continue with aspirin.  Dental prophylaxis with antibiotics.  Reviewed echocardiogram.  He is interested in getting back on the golf course again, perhaps August Dr. Maren BeachVanTrigt told him.  He will be seeing him in July.  Essential hypertension/orthostatic hypotension - We will not increase hydrochlorthiazide to 25 mg a day because he is still having some orthostatics, blood pressure went from 146/84 down to 112/74 when standing.  He has been experiencing about once a week some symptoms when getting out of his car for instance walking 6 steps feeling somewhat off balance.  We are checking orthostatics  Postoperative atrial fibrillation, paroxysmal -No further recurrence.  Off of amiodarone off of Eliquis.  Continuing with low-dose Toprol 25 mg.  6 months Lawson FiscalLori, 1 year me.  Medication Adjustments/Labs and Tests Ordered: Current medicines are reviewed at length with the  patient today.  Concerns regarding medicines are outlined above.  Medication changes, Labs and Tests ordered today are listed in the Patient Instructions below. Patient Instructions  Medication Instructions:  The current medical regimen is effective;  continue present plan and medications.  Follow-Up: Follow up in 6 months with  Norma FredricksonLori Gerhardt, NP.  You will receive a letter in the mail 2 months before you are due.  Please call us when you receive this letter to schedule your follow up appointment.  Follow up in 1 year with Dr. Anne FuSkains.  You will receive a letter in the mail 2 months before you are due.  Please call us when you receive this letter to schedule your follow up appointment.  If you need a refill on your cardiac medications before your next appointment, please call your pharmacy.  [facility]!!          Signed, Donato SchultzMark Naveen Lorusso, MD  02/25/2018 9:58 AM    [facility] [address], [place], [place]  [address] Phone: [phone]; Fax: [phone]

## 2018-02-28 ENCOUNTER — Encounter (HOSPITAL_COMMUNITY)
Admission: RE | Admit: 2018-02-28 | Discharge: 2018-02-28 | Disposition: A | Discharge status: Home / Self Care | Payer: Medicare HMO | Source: Ambulatory Visit | Attending: Cardiology | Admitting: Cardiology

## 2018-02-28 ENCOUNTER — Encounter (HOSPITAL_COMMUNITY): Payer: Self-pay

## 2018-02-28 DIAGNOSIS — I712 Thoracic aortic aneurysm, without rupture: Secondary | ICD-10-CM | POA: Diagnosis not present

## 2018-02-28 DIAGNOSIS — Z87891 Personal history of nicotine dependence: Secondary | ICD-10-CM | POA: Diagnosis not present

## 2018-02-28 DIAGNOSIS — Z79899 Other long term (current) drug therapy: Secondary | ICD-10-CM | POA: Diagnosis not present

## 2018-02-28 DIAGNOSIS — Z7952 Long term (current) use of systemic steroids: Secondary | ICD-10-CM | POA: Diagnosis not present

## 2018-02-28 DIAGNOSIS — Z952 Presence of prosthetic heart valve: Secondary | ICD-10-CM | POA: Diagnosis not present

## 2018-02-28 DIAGNOSIS — Z7982 Long term (current) use of aspirin: Secondary | ICD-10-CM | POA: Diagnosis not present

## 2018-02-28 DIAGNOSIS — Z79891 Long term (current) use of opiate analgesic: Secondary | ICD-10-CM | POA: Diagnosis not present

## 2018-02-28 DIAGNOSIS — I10 Essential (primary) hypertension: Secondary | ICD-10-CM | POA: Diagnosis not present

## 2018-02-28 NOTE — Progress Notes (Signed)
Daily Session Note  Patient Details  Name: Stephen Mullins MRN: [id number] Date of Birth: 09/25/1945 Referring Provider:     CARDIAC REHAB PHASE II ORIENTATION from 02/22/2018 in [facility]  Referring Provider  Skains, Mark, MD.      Encounter Date: 02/28/2018  Check In: Session Check In - 02/28/18 1207      Check-In   Location  [facility]    Staff Present  Tara Everett, RN BSN;Tyara Nevels, MS,ACSM CEP, Exercise Physiologist;Joann Rion, RN, BSN;Other    Supervising physician immediately available to respond to emergencies  [facility] immediately available    Physician(s)  Dr. Vann     Medication changes reported      No    Fall or balance concerns reported     No    Tobacco Cessation  No Change    Warm-up and Cool-down  Performed as group-led instruction    Resistance Training Performed  Yes    VAD Patient?  No      Pain Assessment   Currently in Pain?  No/denies       Capillary Blood Glucose: No results found for this or any previous visit (from the past 24 hour(s)).    Social History   Tobacco Use  Smoking Status Former Smoker  . Last attempt to quit: 10/20/2001  . Years since quitting: 16.3  Smokeless Tobacco Never Used    Goals Met:  Exercise tolerated well  Goals Unmet:  Not Applicable  Comments: Pt started cardiac rehab today.  Pt tolerated light exercise without difficulty. VSS, telemetry-SR, asymptomatic.  Medication list reconciled. Pt denies barriers to medicaiton compliance.  PSYCHOSOCIAL ASSESSMENT:  PHQ-0. Pt exhibits positive coping skills, hopeful outlook with supportive family. No psychosocial needs identified at this time, no psychosocial interventions necessary.  Pt oriented to exercise equipment and routine.    Understanding verbalized.    Dr. Traci Turner is Medical Director for Cardiac Rehab at [facility]. 

## 2018-03-02 ENCOUNTER — Encounter (HOSPITAL_COMMUNITY)
Admission: RE | Admit: 2018-03-02 | Discharge: 2018-03-02 | Disposition: A | Discharge status: Home / Self Care | Payer: Medicare HMO | Source: Ambulatory Visit | Attending: Cardiology | Admitting: Cardiology

## 2018-03-02 ENCOUNTER — Encounter (HOSPITAL_COMMUNITY): Payer: Medicare HMO

## 2018-03-02 DIAGNOSIS — Z87891 Personal history of nicotine dependence: Secondary | ICD-10-CM | POA: Diagnosis not present

## 2018-03-02 DIAGNOSIS — Z7952 Long term (current) use of systemic steroids: Secondary | ICD-10-CM | POA: Diagnosis not present

## 2018-03-02 DIAGNOSIS — Z952 Presence of prosthetic heart valve: Secondary | ICD-10-CM | POA: Diagnosis not present

## 2018-03-02 DIAGNOSIS — I712 Thoracic aortic aneurysm, without rupture: Secondary | ICD-10-CM | POA: Diagnosis not present

## 2018-03-02 DIAGNOSIS — I10 Essential (primary) hypertension: Secondary | ICD-10-CM | POA: Diagnosis not present

## 2018-03-02 DIAGNOSIS — Z79891 Long term (current) use of opiate analgesic: Secondary | ICD-10-CM | POA: Diagnosis not present

## 2018-03-02 DIAGNOSIS — Z7982 Long term (current) use of aspirin: Secondary | ICD-10-CM | POA: Diagnosis not present

## 2018-03-02 DIAGNOSIS — Z79899 Other long term (current) drug therapy: Secondary | ICD-10-CM | POA: Diagnosis not present

## 2018-03-04 ENCOUNTER — Encounter (HOSPITAL_COMMUNITY): Payer: Medicare HMO

## 2018-03-04 ENCOUNTER — Encounter (HOSPITAL_COMMUNITY)
Admission: RE | Admit: 2018-03-04 | Discharge: 2018-03-04 | Disposition: A | Discharge status: Home / Self Care | Payer: Medicare HMO | Source: Ambulatory Visit | Attending: Cardiology | Admitting: Cardiology

## 2018-03-04 DIAGNOSIS — Z7952 Long term (current) use of systemic steroids: Secondary | ICD-10-CM | POA: Diagnosis not present

## 2018-03-04 DIAGNOSIS — I712 Thoracic aortic aneurysm, without rupture: Secondary | ICD-10-CM | POA: Diagnosis not present

## 2018-03-04 DIAGNOSIS — Z7982 Long term (current) use of aspirin: Secondary | ICD-10-CM | POA: Diagnosis not present

## 2018-03-04 DIAGNOSIS — Z79891 Long term (current) use of opiate analgesic: Secondary | ICD-10-CM | POA: Diagnosis not present

## 2018-03-04 DIAGNOSIS — Z87891 Personal history of nicotine dependence: Secondary | ICD-10-CM | POA: Diagnosis not present

## 2018-03-04 DIAGNOSIS — I10 Essential (primary) hypertension: Secondary | ICD-10-CM | POA: Diagnosis not present

## 2018-03-04 DIAGNOSIS — Z952 Presence of prosthetic heart valve: Secondary | ICD-10-CM | POA: Diagnosis not present

## 2018-03-04 DIAGNOSIS — Z79899 Other long term (current) drug therapy: Secondary | ICD-10-CM | POA: Diagnosis not present

## 2018-03-07 ENCOUNTER — Encounter (HOSPITAL_COMMUNITY)
Admission: RE | Admit: 2018-03-07 | Discharge: 2018-03-07 | Disposition: A | Discharge status: Home / Self Care | Payer: Medicare HMO | Source: Ambulatory Visit | Attending: Cardiology | Admitting: Cardiology

## 2018-03-07 ENCOUNTER — Encounter (HOSPITAL_COMMUNITY): Payer: Medicare HMO

## 2018-03-07 DIAGNOSIS — Z79891 Long term (current) use of opiate analgesic: Secondary | ICD-10-CM | POA: Insufficient documentation

## 2018-03-07 DIAGNOSIS — I712 Thoracic aortic aneurysm, without rupture: Secondary | ICD-10-CM | POA: Diagnosis not present

## 2018-03-07 DIAGNOSIS — Z952 Presence of prosthetic heart valve: Secondary | ICD-10-CM

## 2018-03-07 DIAGNOSIS — Z87891 Personal history of nicotine dependence: Secondary | ICD-10-CM | POA: Insufficient documentation

## 2018-03-07 DIAGNOSIS — Z79899 Other long term (current) drug therapy: Secondary | ICD-10-CM | POA: Insufficient documentation

## 2018-03-07 DIAGNOSIS — Z7982 Long term (current) use of aspirin: Secondary | ICD-10-CM | POA: Insufficient documentation

## 2018-03-07 DIAGNOSIS — Z7952 Long term (current) use of systemic steroids: Secondary | ICD-10-CM | POA: Insufficient documentation

## 2018-03-07 DIAGNOSIS — I10 Essential (primary) hypertension: Secondary | ICD-10-CM | POA: Insufficient documentation

## 2018-03-07 NOTE — Progress Notes (Signed)
Stephen Mullins 72 y.o. male Nutrition Note Spoke with pt. Nutrition Plan and Nutrition Survey goals reviewed with pt. Pt wants to maintain his weight and would like to focus on eating an overall healthy diet. Pt is not following a Heart Healthy diet. Recommended pt incorporate lean protein, leaner cooking methods, and complex carbohydrates into his diet. Reviewed what constitutes lean protein with patient and reviewed lean cuts of meat. Pt last HBA1C 5.8. Pt curious about whole grains, discussed incorporating whole grains over refined grains, and educated pt on complex carbohydrates. Pt  expressed understanding of the information reviewed. Suspect pt may need additional education and reinforcement of information discussed today. Pt aware of nutrition classes offered.   Lab Results  Component Value Date   HGBA1C 5.8 (H) 12/16/2017    Wt Readings from Last 3 Encounters:  02/25/18 191 lb 12.8 oz (87 kg)  02/22/18 191 lb 9.3 oz (86.9 kg)  02/02/18 189 lb 9.6 oz (86 kg)    Nutrition Diagnosis ? Food-and nutrition-related knowledge deficit related to lack of exposure to information as related to diagnosis of: ? CVD  PreDM ? Overweight related to excessive energy intake as evidenced by a BMI of 26.75  Nutrition Intervention ? Pt's individual nutrition plan reviewed with pt. ? Benefits of adopting Heart Healthy diet discussed when Medficts reviewed.   ? Continue client-centered nutrition education by RD, as part of interdisciplinary care.  Goal(s)  Pt to identify and limit food sources of saturated fat, trans fat, and sodium  Pt able to name foods that affect blood glucose   Plan:  Pt to attend nutrition classes ? Nutrition I ? Nutrition II ? Portion Distortion  Will provide client-centered nutrition education as part of interdisciplinary care.   Monitor and evaluate progress toward nutrition goal with team.   Ross MarcusAubrey Burklin, MS, RD, LDN 03/07/2018 10:54 AM

## 2018-03-09 ENCOUNTER — Encounter (HOSPITAL_COMMUNITY)
Admission: RE | Admit: 2018-03-09 | Discharge: 2018-03-09 | Disposition: A | Discharge status: Home / Self Care | Payer: Medicare HMO | Source: Ambulatory Visit | Attending: Cardiology | Admitting: Cardiology

## 2018-03-09 ENCOUNTER — Encounter (HOSPITAL_COMMUNITY): Payer: Medicare HMO

## 2018-03-09 DIAGNOSIS — Z952 Presence of prosthetic heart valve: Secondary | ICD-10-CM | POA: Diagnosis not present

## 2018-03-09 DIAGNOSIS — Z87891 Personal history of nicotine dependence: Secondary | ICD-10-CM | POA: Diagnosis not present

## 2018-03-09 DIAGNOSIS — I10 Essential (primary) hypertension: Secondary | ICD-10-CM | POA: Diagnosis not present

## 2018-03-09 DIAGNOSIS — I712 Thoracic aortic aneurysm, without rupture: Secondary | ICD-10-CM | POA: Diagnosis not present

## 2018-03-09 DIAGNOSIS — Z79891 Long term (current) use of opiate analgesic: Secondary | ICD-10-CM | POA: Diagnosis not present

## 2018-03-09 DIAGNOSIS — Z7982 Long term (current) use of aspirin: Secondary | ICD-10-CM | POA: Diagnosis not present

## 2018-03-09 DIAGNOSIS — Z79899 Other long term (current) drug therapy: Secondary | ICD-10-CM | POA: Diagnosis not present

## 2018-03-09 DIAGNOSIS — Z7952 Long term (current) use of systemic steroids: Secondary | ICD-10-CM | POA: Diagnosis not present

## 2018-03-09 NOTE — Progress Notes (Signed)
Reviewed home exercise guidelines with patient including endpoints, temperature precautions, target heart rate and rate of perceived exertion. Pt is walking 20 minutes twice daily as his mode of home exercise. Pt voices understanding of instructions given. Artist Paislinty M Karmina Zufall, MS, ACSM CEP

## 2018-03-11 ENCOUNTER — Encounter (HOSPITAL_COMMUNITY): Payer: Medicare HMO

## 2018-03-11 ENCOUNTER — Encounter (HOSPITAL_COMMUNITY)
Admission: RE | Admit: 2018-03-11 | Discharge: 2018-03-11 | Disposition: A | Discharge status: Home / Self Care | Payer: Medicare HMO | Source: Ambulatory Visit | Attending: Cardiology | Admitting: Cardiology

## 2018-03-11 DIAGNOSIS — Z952 Presence of prosthetic heart valve: Secondary | ICD-10-CM

## 2018-03-11 DIAGNOSIS — Z87891 Personal history of nicotine dependence: Secondary | ICD-10-CM | POA: Diagnosis not present

## 2018-03-11 DIAGNOSIS — Z79891 Long term (current) use of opiate analgesic: Secondary | ICD-10-CM | POA: Diagnosis not present

## 2018-03-11 DIAGNOSIS — I10 Essential (primary) hypertension: Secondary | ICD-10-CM | POA: Diagnosis not present

## 2018-03-11 DIAGNOSIS — Z79899 Other long term (current) drug therapy: Secondary | ICD-10-CM | POA: Diagnosis not present

## 2018-03-11 DIAGNOSIS — Z7952 Long term (current) use of systemic steroids: Secondary | ICD-10-CM | POA: Diagnosis not present

## 2018-03-11 DIAGNOSIS — Z7982 Long term (current) use of aspirin: Secondary | ICD-10-CM | POA: Diagnosis not present

## 2018-03-11 DIAGNOSIS — I712 Thoracic aortic aneurysm, without rupture: Secondary | ICD-10-CM | POA: Diagnosis not present

## 2018-03-14 ENCOUNTER — Encounter (HOSPITAL_COMMUNITY)
Admission: RE | Admit: 2018-03-14 | Discharge: 2018-03-14 | Disposition: A | Discharge status: Home / Self Care | Payer: Medicare HMO | Source: Ambulatory Visit | Attending: Cardiology | Admitting: Cardiology

## 2018-03-14 ENCOUNTER — Encounter (HOSPITAL_COMMUNITY): Payer: Medicare HMO

## 2018-03-14 DIAGNOSIS — I10 Essential (primary) hypertension: Secondary | ICD-10-CM | POA: Diagnosis not present

## 2018-03-14 DIAGNOSIS — I712 Thoracic aortic aneurysm, without rupture: Secondary | ICD-10-CM | POA: Diagnosis not present

## 2018-03-14 DIAGNOSIS — Z87891 Personal history of nicotine dependence: Secondary | ICD-10-CM | POA: Diagnosis not present

## 2018-03-14 DIAGNOSIS — Z79899 Other long term (current) drug therapy: Secondary | ICD-10-CM | POA: Diagnosis not present

## 2018-03-14 DIAGNOSIS — Z952 Presence of prosthetic heart valve: Secondary | ICD-10-CM | POA: Diagnosis not present

## 2018-03-14 DIAGNOSIS — Z7982 Long term (current) use of aspirin: Secondary | ICD-10-CM | POA: Diagnosis not present

## 2018-03-14 DIAGNOSIS — Z79891 Long term (current) use of opiate analgesic: Secondary | ICD-10-CM | POA: Diagnosis not present

## 2018-03-14 DIAGNOSIS — Z7952 Long term (current) use of systemic steroids: Secondary | ICD-10-CM | POA: Diagnosis not present

## 2018-03-15 ENCOUNTER — Ambulatory Visit (HOSPITAL_COMMUNITY): Payer: Medicare HMO

## 2018-03-15 NOTE — Progress Notes (Signed)
Cardiac Individual Treatment Plan  Patient Details  Name: Stephen Mullins MRN: [id number] Date of Birth: [birth year] Referring Provider:     CARDIAC REHAB PHASE II ORIENTATION from 02/22/2018 in [facility] CARDIAC REHAB  Referring Provider  Donato Schultz, MD.      Initial Encounter Date:    CARDIAC REHAB PHASE II ORIENTATION from 02/22/2018 in [facility] CARDIAC REHAB  Date  02/22/18      Visit Diagnosis: S/P AVR (aortic valve replacement)  Patient's Home Medications on Admission:  Current Outpatient Medications:  .  acetaminophen (TYLENOL) 500 MG tablet, Take 2 tablets (1,000 mg total) by mouth every 6 (six) hours as needed., Disp: 30 tablet, Rfl: 0 .  aspirin 81 MG tablet, Take 81 mg by mouth daily., Disp: , Rfl:  .  hydrochlorothiazide (HYDRODIURIL) 12.5 MG tablet, Take 1 tablet (12.5 mg total) by mouth daily., Disp: 90 tablet, Rfl: 3 .  hydrocortisone cream 1 %, Apply 1 application topically daily as needed for itching., Disp: , Rfl:  .  metoprolol succinate (TOPROL-XL) 25 MG 24 hr tablet, Take 25 mg by mouth daily., Disp: , Rfl:  .  neomycin-bacitracin-polymyxin (NEOSPORIN) ointment, Apply 1 application topically daily as needed for wound care., Disp: , Rfl:  .  oxyCODONE (OXY IR/ROXICODONE) 5 MG immediate release tablet, Take 1 tablet (5 mg total) by mouth every 4 (four) hours as needed for severe pain. (Patient not taking: Reported on 02/28/2018), Disp: 30 tablet, Rfl: 0 .  oxymetazoline (AFRIN) 0.05 % nasal spray, Place 1 spray into both nostrils daily as needed for congestion., Disp: , Rfl:   Past Medical History: Past Medical History:  Diagnosis Date  . 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 Borderline hyperlipidemia   . Elevated fasting glucose   . Elevated PSA    due to prostatitis  . GERD (gastroesophageal reflux disease)   . Heart murmur   . Hypertension   .  Nail fungus   . Thoracic ascending aortic aneurysm (HCC)     Tobacco Use: Social History   Tobacco Use  Smoking Status Former Smoker  . Last attempt to quit: 10/20/2001  . Years since quitting: 16.4  Smokeless Tobacco Never Used    Labs: Recent Review Flowsheet Data    Labs for ITP Cardiac and Pulmonary Rehab Latest Ref Rng & Units 12/20/2017 12/20/2017 12/20/2017 12/21/2017 12/22/2017   Hemoglobin A1c 4.8 - 5.6 % - - - - -   PHART 7.350 - 7.450 7.347(L) - 7.331(L) - -   PCO2ART 32.0 - 48.0 mmHg 44.4 - 45.3 - -   HCO3 20.0 - 28.0 mmol/L 24.1 - 23.5 - -   TCO2 22 - 32 mmol/L 25 25 25 23 26    ACIDBASEDEF 0.0 - 2.0 mmol/L 1.0 - 2.0 - -   O2SAT % 98.0 - 98.0 - -      Capillary Blood Glucose: Lab Results  Component Value Date   GLUCAP 116 (H) 12/23/2017   GLUCAP 111 (H) 12/23/2017   GLUCAP 132 (H) 12/22/2017   GLUCAP 114 (H) 12/22/2017   GLUCAP 131 (H) 12/22/2017     Exercise Target Goals:    Exercise Program Goal: Individual exercise prescription set using results from initial 6 min walk test and THRR while considering  patient's activity barriers and safety.   Exercise Prescription Goal: Initial exercise prescription builds to 30-45 minutes a day of aerobic activity, 2-3  days per week.  Home exercise guidelines will be given to patient during program as part of exercise prescription that the participant will acknowledge.  Activity Barriers & Risk Stratification: Activity Barriers & Cardiac Risk Stratification - 02/22/18 0847      Activity Barriers & Cardiac Risk Stratification   Activity Barriers  Deconditioning;Muscular Weakness;Incisional Pain    Cardiac Risk Stratification  High       6 Minute Walk: 6 Minute Walk    Row Name 02/22/18 0828         6 Minute Walk   Phase  Initial     Distance  1498 feet     Walk Time  6 minutes     # of Rest Breaks  0     MPH  2.84     METS  3.09     RPE  11     VO2 Peak  10.82     Symptoms  No     Resting HR  60 bpm      Resting BP  144/82     Resting Oxygen Saturation   95 %     Exercise Oxygen Saturation  during 6 min walk  95 %     Max Ex. HR  73 bpm     Max Ex. BP  144/84     2 Minute Post BP  120/60        Oxygen Initial Assessment:   Oxygen Re-Evaluation:   Oxygen Discharge (Final Oxygen Re-Evaluation):   Initial Exercise Prescription: Initial Exercise Prescription - 02/22/18 1100      Date of Initial Exercise RX and Referring Provider   Date  02/22/18    Referring Provider  Donato Schultz, MD.      Treadmill   MPH  2.7    Grade  0    Minutes  10    METs  3.07      Bike   Level  1    Minutes  10    METs  3.16      NuStep   Level  3    SPM  85    Minutes  10    METs  3      Prescription Details   Frequency (times per week)  3    Duration  Progress to 30 minutes of continuous aerobic without signs/symptoms of physical distress      Intensity   THRR 40-80% of Max Heartrate  59-118    Ratings of Perceived Exertion  11-13    Perceived Dyspnea  0-4      Progression   Progression  Continue to progress workloads to maintain intensity without signs/symptoms of physical distress.      Resistance Training   Training Prescription  Yes    Weight  3lbs    Reps  10-15       Perform Capillary Blood Glucose checks as needed.  Exercise Prescription Changes: Exercise Prescription Changes    Row Name 03/07/18 0947             Response to Exercise   Blood Pressure (Admit)  118/90       Blood Pressure (Exercise)  124/80       Blood Pressure (Exit)  110/70       Heart Rate (Admit)  63 bpm       Heart Rate (Exercise)  90 bpm       Heart Rate (Exit)  66 bpm  Rating of Perceived Exertion (Exercise)  12       Symptoms  none       Duration  Continue with 30 min of aerobic exercise without signs/symptoms of physical distress.       Intensity  THRR unchanged         Progression   Progression  Continue to progress workloads to maintain intensity without signs/symptoms  of physical distress.       Average METs  3.6         Resistance Training   Training Prescription  Yes       Weight  3lbs       Reps  10-15       Time  10 Minutes         Interval Training   Interval Training  No         Treadmill   MPH  3       Grade  0       Minutes  10       METs  3.3         Bike   Level  1.4       Minutes  10       METs  4.04         NuStep   Level  3       SPM  85       Minutes  10       METs  3.6          Exercise Comments: Exercise Comments    Row Name 03/07/18 1053 03/09/18 1030         Exercise Comments  Reviewed METs and goals with patient.  Reviewed home exercise guidelines with patient.         Exercise Goals and Review: Exercise Goals    Row Name 02/22/18 0847             Exercise Goals   Increase Physical Activity  Yes       Intervention  Provide advice, education, support and counseling about physical activity/exercise needs.;Develop an individualized exercise prescription for aerobic and resistive training based on initial evaluation findings, risk stratification, comorbidities and participant's personal goals.       Expected Outcomes  Short Term: Attend rehab on a regular basis to increase amount of physical activity.;Long Term: Exercising regularly at least 3-5 days a week.;Long Term: Add in home exercise to make exercise part of routine and to increase amount of physical activity.       Increase Strength and Stamina  Yes return to gym routine and increase muscle tone       Intervention  Provide advice, education, support and counseling about physical activity/exercise needs.;Develop an individualized exercise prescription for aerobic and resistive training based on initial evaluation findings, risk stratification, comorbidities and participant's personal goals.       Expected Outcomes  Short Term: Perform resistance training exercises routinely during rehab and add in resistance training at home;Short Term: Increase workloads  from initial exercise prescription for resistance, speed, and METs.;Long Term: Improve cardiorespiratory fitness, muscular endurance and strength as measured by increased METs and functional capacity ( )       Able to understand and use rate of perceived exertion (RPE) scale  Yes       Intervention  Provide education and explanation on how to use RPE scale       Expected Outcomes  Short Term: Able to use RPE daily  in rehab to express subjective intensity level;Long Term:  Able to use RPE to guide intensity level when exercising independently       Knowledge and understanding of Target Heart Rate Range (THRR)  Yes       Intervention  Provide education and explanation of THRR including how the numbers were predicted and where they are located for reference       Expected Outcomes  Short Term: Able to state/look up THRR;Long Term: Able to use THRR to govern intensity when exercising independently;Short Term: Able to use daily as guideline for intensity in rehab       Able to check pulse independently  Yes       Intervention  Provide education and demonstration on how to check pulse in carotid and radial arteries.;Review the importance of being able to check your own pulse for safety during independent exercise       Expected Outcomes  Short Term: Able to explain why pulse checking is important during independent exercise;Long Term: Able to check pulse independently and accurately       Understanding of Exercise Prescription  Yes       Intervention  Provide education, explanation, and written materials on patient's individual exercise prescription       Expected Outcomes  Short Term: Able to explain program exercise prescription;Long Term: Able to explain home exercise prescription to exercise independently          Exercise Goals Re-Evaluation : Exercise Goals Re-Evaluation    Row Name 03/07/18 1053 03/09/18 1030           Exercise Goal Re-Evaluation   Exercise Goals Review  Able to  understand and use rate of perceived exertion (RPE) scale;Knowledge and understanding of Target Heart Rate Range (THRR);Understanding of Exercise Prescription;Increase Physical Activity  Able to understand and use rate of perceived exertion (RPE) scale;Knowledge and understanding of Target Heart Rate Range (THRR);Understanding of Exercise Prescription;Increase Physical Activity      Comments  Patient is off to a great start with exercise. Pt's goal is to return to exercise at gym. Pt is planning to do ~10-12 sessions at [facility] then return to exercise routine at the gym. Pt is currently walking daily, 1 mile ~20 minutes.  Reviewed home exercise guidelines with patient including THRR, RPE scale, and endpoints for exercise. Pt is walking 20 minutes twice daily in addition to exercise at [facility].      Expected Outcomes  Patient will exercise at least 30 minutes on all or most days of the week to help improve strength and stamina. Pt will know parameters to return to a regular exercise routine.  Patient will exercise at least 30 minutes on all or most days of the week to help improve strength and stamina. Pt will know parameters to return to a regular exercise routine.          Discharge Exercise Prescription (Final Exercise Prescription Changes): Exercise Prescription Changes - 03/07/18 0947      Response to Exercise   Blood Pressure (Admit)  118/90    Blood Pressure (Exercise)  124/80    Blood Pressure (Exit)  110/70    Heart Rate (Admit)  63 bpm    Heart Rate (Exercise)  90 bpm    Heart Rate (Exit)  66 bpm    Rating of Perceived Exertion (Exercise)  12    Symptoms  none    Duration  Continue with 30 min of aerobic exercise without signs/symptoms of physical distress.  Intensity  THRR unchanged      Progression   Progression  Continue to progress workloads to maintain intensity without signs/symptoms of physical distress.    Average METs  3.6      Resistance Training   Training Prescription  Yes     Weight  3lbs    Reps  10-15    Time  10 Minutes      Interval Training   Interval Training  No      Treadmill   MPH  3    Grade  0    Minutes  10    METs  3.3      Bike   Level  1.4    Minutes  10    METs  4.04      NuStep   Level  3    SPM  85    Minutes  10    METs  3.6       Nutrition:  Target Goals: Understanding of nutrition guidelines, daily intake of sodium 1500mg , cholesterol 200mg , calories 30% from fat and 7% or less from saturated fats, daily to have 5 or more servings of fruits and vegetables.  Biometrics: Pre Biometrics - 02/22/18 0828      Pre Biometrics   Height  5' 11.5" (1.816 m)    Weight  191 lb 9.3 oz (86.9 kg)    Waist Circumference  41.75 inches    Hip Circumference  39.75 inches    Waist to Hip Ratio  1.05 %    BMI (Calculated)  26.35    Triceps Skinfold  17 mm    % Body Fat  28.2 %    Grip Strength  39 kg    Flexibility  10 in    Single Leg Stand  12.91 seconds        Nutrition Therapy Plan and Nutrition Goals: Nutrition Therapy & Goals - 02/22/18 1514      Nutrition Therapy   Diet  heart healthy      Intervention Plan   Intervention  Prescribe, educate and counsel regarding individualized specific dietary modifications aiming towards targeted core components such as weight, hypertension, lipid management, diabetes, heart failure and other comorbidities.    Expected Outcomes  Short Term Goal: Understand basic principles of dietary content, such as calories, fat, sodium, cholesterol and nutrients.       Nutrition Assessments: Nutrition Assessments - 02/22/18 1515      MEDFICTS Scores   Pre Score  95       Nutrition Goals Re-Evaluation:   Nutrition Goals Re-Evaluation:   Nutrition Goals Discharge (Final Nutrition Goals Re-Evaluation):   Psychosocial: Target Goals: Acknowledge presence or absence of significant depression and/or stress, maximize coping skills, provide positive support system. Participant is able to  verbalize types and ability to use techniques and skills needed for reducing stress and depression.  Initial Review & Psychosocial Screening: Initial Psych Review & Screening - 02/22/18 1031      Initial Review   Current issues with  None Identified      Family Dynamics   Good Support System?  Yes spouse, family       Barriers   Psychosocial barriers to participate in program  There are no identifiable barriers or psychosocial needs.      Screening Interventions   Interventions  Encouraged to exercise       Quality of Life Scores: Quality of Life - 02/22/18 1032      Quality of Life  Scores   Health/Function Pre  28.1 %    Socioeconomic Pre  29.5 %    Psych/Spiritual Pre  28.5 %    Family Pre  29.5 %    GLOBAL Pre  28.6 %      Scores of 19 and below usually indicate a poorer quality of life in these areas.  A difference of  2-3 points is a clinically meaningful difference.  A difference of 2-3 points in the total score of the Quality of Life Index has been associated with significant improvement in overall quality of life, self-image, physical symptoms, and general health in studies assessing change in quality of life.  PHQ-9: Recent Review Flowsheet Data    Depression screen [facility] 2/9 02/28/2018   Decreased Interest 0   Down, Depressed, Hopeless 0   PHQ - 2 Score 0     Interpretation of Total Score  Total Score Depression Severity:  1-4 = Minimal depression, 5-9 = Mild depression, 10-14 = Moderate depression, 15-19 = Moderately severe depression, 20-27 = Severe depression   Psychosocial Evaluation and Intervention: Psychosocial Evaluation - 02/28/18 1331      Psychosocial Evaluation & Interventions   Interventions  Encouraged to exercise with the program and follow exercise prescription    Comments  No psychosocial needs identifed. No intervention necessary.  Shepard has previously enjoyed golfing.     Expected Outcomes  Pt will exhibit a positive outlook utilizing good  coping skills.    Continue Psychosocial Services   No Follow up required       Psychosocial Re-Evaluation: Psychosocial Re-Evaluation    Row Name 03/15/18 1625             Psychosocial Re-Evaluation   Current issues with  None Identified       Interventions  Encouraged to attend Cardiac Rehabilitation for the exercise       Continue Psychosocial Services   No Follow up required          Psychosocial Discharge (Final Psychosocial Re-Evaluation): Psychosocial Re-Evaluation - 03/15/18 1625      Psychosocial Re-Evaluation   Current issues with  None Identified    Interventions  Encouraged to attend Cardiac Rehabilitation for the exercise    Continue Psychosocial Services   No Follow up required       Vocational Rehabilitation: Provide vocational rehab assistance to qualifying candidates.   Vocational Rehab Evaluation & Intervention: Vocational Rehab - 02/22/18 1031      Initial Vocational Rehab Evaluation & Intervention   Assessment shows need for Vocational Rehabilitation  No       Education: Education Goals: Education classes will be provided on a weekly basis, covering required topics. Participant will state understanding/return demonstration of topics presented.  Learning Barriers/Preferences: Learning Barriers/Preferences - 02/22/18 0847      Learning Barriers/Preferences   Learning Barriers  Sight    Learning Preferences  Written Material;Verbal Instruction;Skilled Demonstration       Education Topics: Count Your Pulse:  -Group instruction provided by verbal instruction, demonstration, patient participation and written materials to support subject.  Instructors address importance of being able to find your pulse and how to count your pulse when at home without a heart monitor.  Patients get hands on experience counting their pulse with staff help and individually.   Heart Attack, Angina, and Risk Factor Modification:  -Group instruction provided by verbal  instruction, video, and written materials to support subject.  Instructors address signs and symptoms of angina and heart attacks.  Also discuss risk factors for heart disease and how to make changes to improve heart health risk factors.   Functional Fitness:  -Group instruction provided by verbal instruction, demonstration, patient participation, and written materials to support subject.  Instructors address safety measures for doing things around the house.  Discuss how to get up and down off the floor, how to pick things up properly, how to safely get out of a chair without assistance, and balance training.   Meditation and Mindfulness:  -Group instruction provided by verbal instruction, patient participation, and written materials to support subject.  Instructor addresses importance of mindfulness and meditation practice to help reduce stress and improve awareness.  Instructor also leads participants through a meditation exercise.    Stretching for Flexibility and Mobility:  -Group instruction provided by verbal instruction, patient participation, and written materials to support subject.  Instructors lead participants through series of stretches that are designed to increase flexibility thus improving mobility.  These stretches are additional exercise for major muscle groups that are typically performed during regular warm up and cool down.   Hands Only CPR:  -Group verbal, video, and participation provides a basic overview of AHA guidelines for community CPR. Role-play of emergencies allow participants the opportunity to practice calling for help and chest compression technique with discussion of AED use.   Hypertension: -Group verbal and written instruction that provides a basic overview of hypertension including the most recent diagnostic guidelines, risk factor reduction with self-care instructions and medication management.    Nutrition I class: Heart Healthy Eating:  -Group  instruction provided by PowerPoint slides, verbal discussion, and written materials to support subject matter. The instructor gives an explanation and review of the Therapeutic Lifestyle Changes diet recommendations, which includes a discussion on lipid goals, dietary fat, sodium, fiber, plant stanol/sterol esters, sugar, and the components of a well-balanced, healthy diet.   Nutrition II class: Lifestyle Skills:  -Group instruction provided by PowerPoint slides, verbal discussion, and written materials to support subject matter. The instructor gives an explanation and review of label reading, grocery shopping for heart health, heart healthy recipe modifications, and ways to make healthier choices when eating out.   Diabetes Question & Answer:  -Group instruction provided by PowerPoint slides, verbal discussion, and written materials to support subject matter. The instructor gives an explanation and review of diabetes co-morbidities, pre- and post-prandial blood glucose goals, pre-exercise blood glucose goals, signs, symptoms, and treatment of hypoglycemia and hyperglycemia, and foot care basics.   Diabetes Blitz:  -Group instruction provided by PowerPoint slides, verbal discussion, and written materials to support subject matter. The instructor gives an explanation and review of the physiology behind type 1 and type 2 diabetes, diabetes medications and rational behind using different medications, pre- and post-prandial blood glucose recommendations and Hemoglobin A1c goals, diabetes diet, and exercise including blood glucose guidelines for exercising safely.    Portion Distortion:  -Group instruction provided by PowerPoint slides, verbal discussion, written materials, and food models to support subject matter. The instructor gives an explanation of serving size versus portion size, changes in portions sizes over the last 20 years, and what consists of a serving from each food group.   Stress  Management:  -Group instruction provided by verbal instruction, video, and written materials to support subject matter.  Instructors review role of stress in heart disease and how to cope with stress positively.     Exercising on Your Own:  -Group instruction provided by verbal instruction, power point, and written materials  to support subject.  Instructors discuss benefits of exercise, components of exercise, frequency and intensity of exercise, and end points for exercise.  Also discuss use of nitroglycerin and activating EMS.  Review options of places to exercise outside of rehab.  Review guidelines for sex with heart disease.   Cardiac Drugs I:  -Group instruction provided by verbal instruction and written materials to support subject.  Instructor reviews cardiac drug classes: antiplatelets, anticoagulants, beta blockers, and statins.  Instructor discusses reasons, side effects, and lifestyle considerations for each drug class.   Cardiac Drugs II:  -Group instruction provided by verbal instruction and written materials to support subject.  Instructor reviews cardiac drug classes: angiotensin converting enzyme inhibitors (ACE-I), angiotensin II receptor blockers (ARBs), nitrates, and calcium channel blockers.  Instructor discusses reasons, side effects, and lifestyle considerations for each drug class.   Anatomy and Physiology of the Circulatory System:  Group verbal and written instruction and models provide basic cardiac anatomy and physiology, with the coronary electrical and arterial systems. Review of: AMI, Angina, Valve disease, Heart Failure, Peripheral Artery Disease, Cardiac Arrhythmia, Pacemakers, and the ICD.   Other Education:  -Group or individual verbal, written, or video instructions that support the educational goals of the cardiac rehab program.   Holiday Eating Survival Tips:  -Group instruction provided by PowerPoint slides, verbal discussion, and written materials to  support subject matter. The instructor gives patients tips, tricks, and techniques to help them not only survive but enjoy the holidays despite the onslaught of food that accompanies the holidays.   Knowledge Questionnaire Score: Knowledge Questionnaire Score - 02/22/18 1030      Knowledge Questionnaire Score   Pre Score  19/24       Core Components/Risk Factors/Patient Goals at Admission: Personal Goals and Risk Factors at Admission - 02/22/18 0811      Core Components/Risk Factors/Patient Goals on Admission   Hypertension  Yes    Intervention  Provide education on lifestyle modifcations including regular physical activity/exercise, weight management, moderate sodium restriction and increased consumption of fresh fruit, vegetables, and low fat dairy, alcohol moderation, and smoking cessation.;Monitor prescription use compliance.    Expected Outcomes  Short Term: Continued assessment and intervention until BP is < 140/58mm HG in hypertensive participants. < 130/66mm HG in hypertensive participants with diabetes, heart failure or chronic kidney disease.;Long Term: Maintenance of blood pressure at goal levels.    Lipids  Yes    Intervention  Provide education and support for participant on nutrition & aerobic/resistive exercise along with prescribed medications to achieve LDL 70mg , HDL >40mg .    Expected Outcomes  Short Term: Participant states understanding of desired cholesterol values and is compliant with medications prescribed. Participant is following exercise prescription and nutrition guidelines.;Long Term: Cholesterol controlled with medications as prescribed, with individualized exercise RX and with personalized nutrition plan. Value goals: LDL < 70mg , HDL > 40 mg.       Core Components/Risk Factors/Patient Goals Review:  Goals and Risk Factor Review    Row Name 02/28/18 1330 03/15/18 1624           Core Components/Risk Factors/Patient Goals Review   Personal Goals Review   Lipids;Hypertension  Lipids;Hypertension      Review  Pt with few CAD RF willing to participate in CR program. Elmus is looking forward to find an exercise routine to maintain his weight and keep his muscle tone.   Pt with few CAD RF willing to participate in CR program. Cheryl is looking forward to find  an exercise routine to maintain his weight and keep his muscle tone.       Expected Outcomes  Pt will continue to participate in CR exercise, nutrition, and lifestyle modication opportunities.   Pt will continue to participate in CR exercise, nutrition, and lifestyle modication opportunities.          Core Components/Risk Factors/Patient Goals at Discharge (Final Review):  Goals and Risk Factor Review - 03/15/18 1624      Core Components/Risk Factors/Patient Goals Review   Personal Goals Review  Lipids;Hypertension    Review  Pt with few CAD RF willing to participate in CR program. Dhruv is looking forward to find an exercise routine to maintain his weight and keep his muscle tone.     Expected Outcomes  Pt will continue to participate in CR exercise, nutrition, and lifestyle modication opportunities.        ITP Comments: ITP Comments    Row Name 02/21/18 1209 02/28/18 1329 03/15/18 1623       ITP Comments  Dr. Armanda Magic, Medical Director  Onalee Hua started exercise today and tolerated well.   30 Day ITP comment. Patient with good attendance and participation in phase 2 cardiac rehab        Comments: See ITP comments. Shuaib only plans to participate in cardiac rehab for a short time.Gladstone Lighter, RN,BSN 03/15/2018 4:29 PM

## 2018-03-16 ENCOUNTER — Other Ambulatory Visit: Payer: Self-pay

## 2018-03-16 ENCOUNTER — Ambulatory Visit (INDEPENDENT_AMBULATORY_CARE_PROVIDER_SITE_OTHER): Payer: Self-pay | Admitting: Cardiothoracic Surgery

## 2018-03-16 ENCOUNTER — Encounter (HOSPITAL_COMMUNITY): Payer: Medicare HMO

## 2018-03-16 ENCOUNTER — Encounter: Payer: Self-pay | Admitting: Cardiothoracic Surgery

## 2018-03-16 VITALS — BP 138/78 | HR 56 | Resp 18 | Ht 71.0 in | Wt 195.8 lb

## 2018-03-16 DIAGNOSIS — Q231 Congenital insufficiency of aortic valve: Secondary | ICD-10-CM

## 2018-03-16 DIAGNOSIS — Q230 Congenital stenosis of aortic valve: Secondary | ICD-10-CM

## 2018-03-16 DIAGNOSIS — Z954 Presence of other heart-valve replacement: Secondary | ICD-10-CM

## 2018-03-16 NOTE — Progress Notes (Signed)
PCP is [name], MD Referring Provider is Jake Bathe, MD  Chief Complaint  Patient presents with  . Aortic Stenosis    f/u, s/p AVR 12/20/17  . Routine Post Op    HPI: Patient returns for scheduled postop office visit after aortic valve replacement for aortic stenosis almost 3 months ago.  Patient is complaining his outpatient cardiac rehab program at the hospital.  He has no complaints.  He shared some concerns over his blood pressure which she measures daily several times.  For the most part his blood pressure is less than 150 systolic and he remains on Toprol-XL 25 mg daily.  Resting heart rate is 60.  He denies any symptoms of angina shortness of breath.  The sternal wound is well-healed.  Patient is anxious to increase his activity levels to include golf and working out at his gym.  We discussed those issues and he should be able to proceed now almost 3 months after surgery but do so gradually.  We also discussed the importance of antibiotic prophylaxis prior to dental procedures to avoid prosthetic valve endocarditis.  He has a prescription for  amoxicillin to take 2 hours before dental visit.  Past Medical History:  Diagnosis Date  . 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 Borderline hyperlipidemia   . Elevated fasting glucose   . Elevated PSA    due to prostatitis  . GERD (gastroesophageal reflux disease)   . Heart murmur   . Hypertension   . Nail fungus   . Thoracic ascending aortic aneurysm [facility])     Past Surgical History:  Procedure Laterality Date  . AORTIC VALVE REPLACEMENT N/A 12/20/2017   Procedure: AORTIC VALVE REPLACEMENT (AVR);  Surgeon: [name], [name], MD;  Location: [facility] OR;  Service: Open Heart Surgery;  Laterality: N/A;  . CATARACT EXTRACTION, BILATERAL    . RIGHT/LEFT HEART CATH AND CORONARY ANGIOGRAPHY N/A 11/09/2017   Procedure: RIGHT/LEFT HEART CATH AND CORONARY ANGIOGRAPHY;   Surgeon: [name], MD;  Location: [facility] INVASIVE CV LAB;  Service: Cardiovascular;  Laterality: N/A;  . TEE WITHOUT CARDIOVERSION N/A 12/20/2017   Procedure: TRANSESOPHAGEAL ECHOCARDIOGRAM (TEE);  Surgeon: [name], [name], MD;  Location: [facility] OR;  Service: Open Heart Surgery;  Laterality: N/A;    Family History  Problem Relation Age of Onset  . Prostate cancer Father     Social History Social History   Tobacco Use  . Smoking status: Former Smoker    Last attempt to quit: 10/20/2001    Years since quitting: 16.4  . Smokeless tobacco: Never Used  Substance Use Topics  . Alcohol use: Yes    Alcohol/week: 0.6 - 1.2 oz    Types: 1 - 2 Glasses of wine per week  . Drug use: No    Current Outpatient Medications  Medication Sig Dispense Refill  . acetaminophen (TYLENOL) 500 MG tablet Take 2 tablets (1,000 mg total) by mouth every 6 (six) hours as needed. 30 tablet 0  . aspirin 81 MG tablet Take 81 mg by mouth daily.    . hydrochlorothiazide (HYDRODIURIL) 12.5 MG tablet Take 1 tablet (12.5 mg total) by mouth daily. 90 tablet 3  . hydrocortisone cream 1 % Apply 1 application topically daily as needed for itching.    . metoprolol succinate (TOPROL-XL) 25 MG 24 hr tablet Take 25 mg by mouth daily.    [name] neomycin-bacitracin-polymyxin (NEOSPORIN) ointment Apply 1 application  topically daily as needed for wound care.    [name]. oxymetazoline (AFRIN) 0.05 % nasal spray Place 1 spray into both nostrils daily as needed for congestion.     No current facility-administered medications for this visit.     Allergies  Allergen Reactions  . Tetanus Toxoid, Adsorbed Other (See Comments)    Pt. States as small child he had tetnus made from horse serum and he turned 'blue' ?? SHORTNESS OF BREATH ??    Review of Systems  No complaints  BP 138/78 (BP Location: Left Arm, Patient Position: Sitting, Cuff Size: Normal)   Pulse (!) 56   Resp 18   Ht 5\' 11"  (1.803 m)   Wt 195 lb 12.8 oz (88.8 kg)    SpO2 95% Comment: RA  BMI 27.31 kg/m  Physical Exam      Exam    General- alert and comfortable    Neck- no JVD, no cervical adenopathy palpable, no carotid bruit   Lungs- clear without rales, wheezes   Cor- regular rate and rhythm, no murmur , gallop   Abdomen- soft, non-tender   Extremities - warm, non-tender, minimal edema   Neuro- oriented, appropriate, no focal weakness   Diagnostic Tests:  none Impression: Doing well after AVR  Plan: Return in April 2020 with CTA to 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

## 2018-03-18 ENCOUNTER — Encounter (HOSPITAL_COMMUNITY): Payer: Medicare HMO

## 2018-03-18 ENCOUNTER — Encounter (HOSPITAL_COMMUNITY)
Admission: RE | Admit: 2018-03-18 | Discharge: 2018-03-18 | Disposition: A | Discharge status: Home / Self Care | Payer: Medicare HMO | Source: Ambulatory Visit | Attending: Cardiology | Admitting: Cardiology

## 2018-03-18 DIAGNOSIS — I10 Essential (primary) hypertension: Secondary | ICD-10-CM | POA: Diagnosis not present

## 2018-03-18 DIAGNOSIS — Z7952 Long term (current) use of systemic steroids: Secondary | ICD-10-CM | POA: Diagnosis not present

## 2018-03-18 DIAGNOSIS — Z87891 Personal history of nicotine dependence: Secondary | ICD-10-CM | POA: Diagnosis not present

## 2018-03-18 DIAGNOSIS — Z952 Presence of prosthetic heart valve: Secondary | ICD-10-CM | POA: Diagnosis not present

## 2018-03-18 DIAGNOSIS — Z7982 Long term (current) use of aspirin: Secondary | ICD-10-CM | POA: Diagnosis not present

## 2018-03-18 DIAGNOSIS — Z79899 Other long term (current) drug therapy: Secondary | ICD-10-CM | POA: Diagnosis not present

## 2018-03-18 DIAGNOSIS — Z79891 Long term (current) use of opiate analgesic: Secondary | ICD-10-CM | POA: Diagnosis not present

## 2018-03-18 DIAGNOSIS — I712 Thoracic aortic aneurysm, without rupture: Secondary | ICD-10-CM | POA: Diagnosis not present

## 2018-03-21 ENCOUNTER — Encounter (HOSPITAL_COMMUNITY): Payer: Medicare HMO

## 2018-03-21 ENCOUNTER — Encounter (HOSPITAL_COMMUNITY)
Admission: RE | Admit: 2018-03-21 | Discharge: 2018-03-21 | Disposition: A | Discharge status: Home / Self Care | Payer: Medicare HMO | Source: Ambulatory Visit | Attending: Cardiology | Admitting: Cardiology

## 2018-03-21 DIAGNOSIS — Z79891 Long term (current) use of opiate analgesic: Secondary | ICD-10-CM | POA: Diagnosis not present

## 2018-03-21 DIAGNOSIS — Z952 Presence of prosthetic heart valve: Secondary | ICD-10-CM | POA: Diagnosis not present

## 2018-03-21 DIAGNOSIS — I712 Thoracic aortic aneurysm, without rupture: Secondary | ICD-10-CM | POA: Diagnosis not present

## 2018-03-21 DIAGNOSIS — Z7982 Long term (current) use of aspirin: Secondary | ICD-10-CM | POA: Diagnosis not present

## 2018-03-21 DIAGNOSIS — Z7952 Long term (current) use of systemic steroids: Secondary | ICD-10-CM | POA: Diagnosis not present

## 2018-03-21 DIAGNOSIS — Z87891 Personal history of nicotine dependence: Secondary | ICD-10-CM | POA: Diagnosis not present

## 2018-03-21 DIAGNOSIS — Z79899 Other long term (current) drug therapy: Secondary | ICD-10-CM | POA: Diagnosis not present

## 2018-03-21 DIAGNOSIS — I10 Essential (primary) hypertension: Secondary | ICD-10-CM | POA: Diagnosis not present

## 2018-03-23 ENCOUNTER — Encounter (HOSPITAL_COMMUNITY): Payer: Medicare HMO

## 2018-03-25 ENCOUNTER — Encounter (HOSPITAL_COMMUNITY)
Admission: RE | Admit: 2018-03-25 | Discharge: 2018-03-25 | Disposition: A | Discharge status: Home / Self Care | Payer: Medicare HMO | Source: Ambulatory Visit | Attending: Cardiology | Admitting: Cardiology

## 2018-03-25 ENCOUNTER — Encounter (HOSPITAL_COMMUNITY): Payer: Medicare HMO

## 2018-03-25 VITALS — BP 128/78 | HR 66 | Ht 71.5 in | Wt 195.3 lb

## 2018-03-25 DIAGNOSIS — Z7952 Long term (current) use of systemic steroids: Secondary | ICD-10-CM | POA: Diagnosis not present

## 2018-03-25 DIAGNOSIS — I10 Essential (primary) hypertension: Secondary | ICD-10-CM | POA: Diagnosis not present

## 2018-03-25 DIAGNOSIS — Z87891 Personal history of nicotine dependence: Secondary | ICD-10-CM | POA: Diagnosis not present

## 2018-03-25 DIAGNOSIS — Z79899 Other long term (current) drug therapy: Secondary | ICD-10-CM | POA: Diagnosis not present

## 2018-03-25 DIAGNOSIS — Z952 Presence of prosthetic heart valve: Secondary | ICD-10-CM

## 2018-03-25 DIAGNOSIS — Z79891 Long term (current) use of opiate analgesic: Secondary | ICD-10-CM | POA: Diagnosis not present

## 2018-03-25 DIAGNOSIS — I712 Thoracic aortic aneurysm, without rupture: Secondary | ICD-10-CM | POA: Diagnosis not present

## 2018-03-25 DIAGNOSIS — Z7982 Long term (current) use of aspirin: Secondary | ICD-10-CM | POA: Diagnosis not present

## 2018-03-25 NOTE — Progress Notes (Signed)
Discharge Progress Report  Patient Details  Name: Stephen Mullins MRN: [id number] Date of Birth: September 07, 1946 Referring Provider:     CARDIAC REHAB PHASE II ORIENTATION from 02/22/2018 in [place]  Referring Provider  Candee Furbish, MD.       Number of Visits: 11  Reason for Discharge:  Patient reached a stable level of exercise. Patient independent in their exercise. Early Exit:  Stephen Mullins is going back to the gym to exercise  Smoking History:  Social History   Tobacco Use  Smoking Status Former Smoker  . Last attempt to quit: 10/20/2001  . Years since quitting: 16.4  Smokeless Tobacco Never Used    Diagnosis:  S/P AVR (aortic valve replacement)  ADL UCSD:   Initial Exercise Prescription: Initial Exercise Prescription - 02/22/18 1100      Date of Initial Exercise RX and Referring Provider   Date  02/22/18    Referring Provider  Candee Furbish, MD.      Treadmill   MPH  2.7    Grade  0    Minutes  10    METs  3.07      Bike   Level  1    Minutes  10    METs  3.16      NuStep   Level  3    SPM  85    Minutes  10    METs  3      Prescription Details   Frequency (times per week)  3    Duration  Progress to 30 minutes of continuous aerobic without signs/symptoms of physical distress      Intensity   THRR 40-80% of Max Heartrate  59-118    Ratings of Perceived Exertion  11-13    Perceived Dyspnea  0-4      Progression   Progression  Continue to progress workloads to maintain intensity without signs/symptoms of physical distress.      Resistance Training   Training Prescription  Yes    Weight  3lbs    Reps  10-15       Discharge Exercise Prescription (Final Exercise Prescription Changes): Exercise Prescription Changes - 03/25/18 0948      Response to Exercise   Blood Pressure (Admit)  128/78    Blood Pressure (Exercise)  130/68    Blood Pressure (Exit)  102/80    Heart Rate (Admit)  66 bpm    Heart Rate (Exercise)   91 bpm    Heart Rate (Exit)  66 bpm    Rating of Perceived Exertion (Exercise)  12    Symptoms  none    Duration  Continue with 30 min of aerobic exercise without signs/symptoms of physical distress.    Intensity  THRR unchanged      Progression   Progression  Continue to progress workloads to maintain intensity without signs/symptoms of physical distress.    Average METs  3.9      Resistance Training   Training Prescription  Yes    Weight  5lbs    Reps  10-15    Time  10 Minutes      Interval Training   Interval Training  No      Treadmill   MPH  3.3    Grade  0    Minutes  10    METs  3.53      Bike   Level  1.4    Minutes  10    METs  4.01      NuStep   Level  5    SPM  85    Minutes  10    METs  4.1      Home Exercise Plan   Plans to continue exercise at  [facility] (comment)    Frequency  Add 4 additional days to program exercise sessions.    Initial Home Exercises Provided  03/09/18       Functional Capacity: 6 Minute Walk    Row Name 02/22/18 0828 03/18/18 1012       6 Minute Walk   Phase  Initial  Discharge    Distance  1498 feet  1950 feet    Distance % Change  -  30.17 %    Walk Time  6 minutes  6 minutes    # of Rest Breaks  0  0    MPH  2.84  3.69    METS  3.09  3.92    RPE  11  11    VO2 Peak  10.82  13.73    Symptoms  No  No    Resting HR  60 bpm  65 bpm    Resting BP  144/82  126/80    Resting Oxygen Saturation   95 %  -    Exercise Oxygen Saturation  during 6 min walk  95 %  -    Max Ex. HR  73 bpm  87 bpm    Max Ex. BP  144/84  156/84    2 Minute Post BP  120/60  118/78       Psychological, QOL, Others - Outcomes: PHQ 2/9: Depression screen [facility] 2/9 03/25/2018 02/28/2018  Decreased Interest 0 0  Down, Depressed, Hopeless 0 0  PHQ - 2 Score 0 0    Quality of Life: Quality of Life - 04/01/18 1641      Quality of Life   Select  Quality of Life      Quality of Life Scores   Health/Function Pre  28.1 %     Health/Function Post  28.77 %    Health/Function % Change  2.38 %    Socioeconomic Pre  29.5 %    Socioeconomic Post  28.93 %    Socioeconomic % Change   -1.93 %    Psych/Spiritual Pre  28.5 %    Psych/Spiritual Post  29.14 %    Psych/Spiritual % Change  2.25 %    Family Pre  29.5 %    Family Post  30 %    Family % Change  1.69 %    GLOBAL Pre  28.6 %    GLOBAL Post  29.06 %    GLOBAL % Change  1.61 %       Personal Goals: Goals established at orientation with interventions provided to work toward goal. Personal Goals and Risk Factors at Admission - 02/22/18 0811      Core Components/Risk Factors/Patient Goals on Admission   Hypertension  Yes    Intervention  Provide education on lifestyle modifcations including regular physical activity/exercise, weight management, moderate sodium restriction and increased consumption of fresh fruit, vegetables, and low fat dairy, alcohol moderation, and smoking cessation.;Monitor prescription use compliance.    Expected Outcomes  Short Term: Continued assessment and intervention until BP is < 140/72m HG in hypertensive participants. < 130/873mHG in hypertensive participants with diabetes, heart failure or chronic kidney disease.;Long Term: Maintenance of blood pressure at goal levels.  Lipids  Yes    Intervention  Provide education and support for participant on nutrition & aerobic/resistive exercise along with prescribed medications to achieve LDL <20m, HDL >493m    Expected Outcomes  Short Term: Participant states understanding of desired cholesterol values and is compliant with medications prescribed. Participant is following exercise prescription and nutrition guidelines.;Long Term: Cholesterol controlled with medications as prescribed, with individualized exercise RX and with personalized nutrition plan. Value goals: LDL < 7044mHDL > 40 mg.        Personal Goals Discharge: Goals and Risk Factor Review    Row Name 02/28/18 1330 03/15/18  1624 03/25/18 1636         Core Components/Risk Factors/Patient Goals Review   Personal Goals Review  Lipids;Hypertension  Lipids;Hypertension  Lipids;Hypertension     Review  Pt with few CAD RF willing to participate in CR program. Stephen Mullins looking forward to find an exercise routine to maintain his weight and keep his muscle tone.   Pt with few CAD RF willing to participate in CR program. Stephen Mullins looking forward to find an exercise routine to maintain his weight and keep his muscle tone.   Pt with few CAD RF willing to participate in CR program. Stephen Mullins looking forward to find an exercise routine to maintain his weight and keep his muscle tone.      Expected Outcomes  Pt will continue to participate in CR exercise, nutrition, and lifestyle modication opportunities.   Pt will continue to participate in CR exercise, nutrition, and lifestyle modication opportunities.   Pt will continue to exercise at the gym 3 days a week upon discharge from cardiac rehab. Follow nutrition, and lifestyle modication opportunities.         Exercise Goals and Review: Exercise Goals    Row Name 02/22/18 0847             Exercise Goals   Increase Physical Activity  Yes       Intervention  Provide advice, education, support and counseling about physical activity/exercise needs.;Develop an individualized exercise prescription for aerobic and resistive training based on initial evaluation findings, risk stratification, comorbidities and participant's personal goals.       Expected Outcomes  Short Term: Attend rehab on a regular basis to increase amount of physical activity.;Long Term: Exercising regularly at least 3-5 days a week.;Long Term: Add in home exercise to make exercise part of routine and to increase amount of physical activity.       Increase Strength and Stamina  Yes return to gym routine and increase muscle tone       Intervention  Provide advice, education, support and counseling about physical  activity/exercise needs.;Develop an individualized exercise prescription for aerobic and resistive training based on initial evaluation findings, risk stratification, comorbidities and participant's personal goals.       Expected Outcomes  Short Term: Perform resistance training exercises routinely during rehab and add in resistance training at home;Short Term: Increase workloads from initial exercise prescription for resistance, speed, and METs.;Long Term: Improve cardiorespiratory fitness, muscular endurance and strength as measured by increased METs and functional capacity (6MWT)       Able to understand and use rate of perceived exertion (RPE) scale  Yes       Intervention  Provide education and explanation on how to use RPE scale       Expected Outcomes  Short Term: Able to use RPE daily in rehab to express subjective intensity level;Long Term:  Able  to use RPE to guide intensity level when exercising independently       Knowledge and understanding of Target Heart Rate Range (THRR)  Yes       Intervention  Provide education and explanation of THRR including how the numbers were predicted and where they are located for reference       Expected Outcomes  Short Term: Able to state/look up THRR;Long Term: Able to use THRR to govern intensity when exercising independently;Short Term: Able to use daily as guideline for intensity in rehab       Able to check pulse independently  Yes       Intervention  Provide education and demonstration on how to check pulse in carotid and radial arteries.;Review the importance of being able to check your own pulse for safety during independent exercise       Expected Outcomes  Short Term: Able to explain why pulse checking is important during independent exercise;Long Term: Able to check pulse independently and accurately       Understanding of Exercise Prescription  Yes       Intervention  Provide education, explanation, and written materials on patient's individual  exercise prescription       Expected Outcomes  Short Term: Able to explain program exercise prescription;Long Term: Able to explain home exercise prescription to exercise independently          Nutrition & Weight - Outcomes: Pre Biometrics - 02/22/18 0828      Pre Biometrics   Height  5' 11.5" (1.816 m)    Weight  191 lb 9.3 oz (86.9 kg)    Waist Circumference  41.75 inches    Hip Circumference  39.75 inches    Waist to Hip Ratio  1.05 %    BMI (Calculated)  26.35    Triceps Skinfold  17 mm    % Body Fat  28.2 %    Grip Strength  39 kg    Flexibility  10 in    Single Leg Stand  12.91 seconds      Post Biometrics - 03/25/18 0948       Post  Biometrics   Height  5' 11.5" (1.816 m)    Weight  195 lb 5.2 oz (88.6 kg)    Waist Circumference  40.75 inches    Hip Circumference  39 inches    Waist to Hip Ratio  1.04 %    BMI (Calculated)  26.87    Triceps Skinfold  10 mm    % Body Fat  25.8 %    Grip Strength  42 kg    Flexibility  10.25 in    Single Leg Stand  21.28 seconds       Nutrition: Nutrition Therapy & Goals - 04/06/18 1527      Nutrition Therapy   Diet  heart healthy      Personal Nutrition Goals   Nutrition Goal  pt to identify and limit food sources of sodium, saturated fat, and trans fat      Intervention Plan   Intervention  Prescribe, educate and counsel regarding individualized specific dietary modifications aiming towards targeted core components such as weight, hypertension, lipid management, diabetes, heart failure and other comorbidities.    Expected Outcomes  Short Term Goal: Understand basic principles of dietary content, such as calories, fat, sodium, cholesterol and nutrients.       Nutrition Discharge: Nutrition Assessments - 04/06/18 1527      MEDFICTS Scores   Pre Score  95    Post Score  61    Score Difference  -34       Education Questionnaire Score: Knowledge Questionnaire Score - 04/01/18 1639      Knowledge Questionnaire Score    Pre Score  19/24    Post Score  21/24       Goals reviewed with patient; copy given to patient. Stephen Mullins graduated from cardiac rehab program on 03/25/18 with completion of 11 exercise sessions in Phase II. Pt maintained good attendance and progressed nicely during his participation in rehab as evidenced by increased MET level.   Medication list reconciled. Repeat  PHQ score-  0.  Pt has made significant lifestyle changes and should be commended for his success. Pt feels he has achieved his goals during cardiac rehab. Stephen Mullins increased his distance on his post exercise walk test  Pt plans to continue exercise at his home gym 3 days a week. We are proud of Stephen Mullins's progress the short time he has been here.Barnet Pall, RN,BSN 04/07/2018 12:36 PM

## 2018-03-28 ENCOUNTER — Encounter (HOSPITAL_COMMUNITY): Payer: Medicare HMO

## 2018-03-30 ENCOUNTER — Encounter (HOSPITAL_COMMUNITY): Payer: Medicare HMO

## 2018-04-01 ENCOUNTER — Encounter (HOSPITAL_COMMUNITY): Payer: Medicare HMO

## 2018-04-04 ENCOUNTER — Encounter (HOSPITAL_COMMUNITY): Payer: Medicare HMO

## 2018-04-06 ENCOUNTER — Encounter (HOSPITAL_COMMUNITY): Payer: Medicare HMO

## 2018-04-08 ENCOUNTER — Encounter (HOSPITAL_COMMUNITY): Payer: Medicare HMO

## 2018-04-11 ENCOUNTER — Encounter (HOSPITAL_COMMUNITY): Payer: Medicare HMO

## 2018-04-13 ENCOUNTER — Encounter (HOSPITAL_COMMUNITY): Payer: Medicare HMO

## 2018-04-15 ENCOUNTER — Encounter (HOSPITAL_COMMUNITY): Payer: Medicare HMO

## 2018-04-18 ENCOUNTER — Encounter (HOSPITAL_COMMUNITY): Payer: Medicare HMO

## 2018-04-20 ENCOUNTER — Encounter (HOSPITAL_COMMUNITY): Payer: Medicare HMO

## 2018-04-22 ENCOUNTER — Encounter (HOSPITAL_COMMUNITY): Payer: Medicare HMO

## 2018-04-25 ENCOUNTER — Encounter (HOSPITAL_COMMUNITY): Payer: Medicare HMO

## 2018-04-26 DIAGNOSIS — R69 Illness, unspecified: Secondary | ICD-10-CM | POA: Diagnosis not present

## 2018-04-27 ENCOUNTER — Encounter (HOSPITAL_COMMUNITY): Payer: Medicare HMO

## 2018-04-29 ENCOUNTER — Encounter (HOSPITAL_COMMUNITY): Payer: Medicare HMO

## 2018-05-02 ENCOUNTER — Encounter (HOSPITAL_COMMUNITY): Payer: Medicare HMO

## 2018-05-04 ENCOUNTER — Encounter (HOSPITAL_COMMUNITY): Payer: Medicare HMO

## 2018-05-05 DIAGNOSIS — Z952 Presence of prosthetic heart valve: Secondary | ICD-10-CM | POA: Diagnosis not present

## 2018-05-05 DIAGNOSIS — I10 Essential (primary) hypertension: Secondary | ICD-10-CM | POA: Diagnosis not present

## 2018-05-05 DIAGNOSIS — R7301 Impaired fasting glucose: Secondary | ICD-10-CM | POA: Diagnosis not present

## 2018-05-05 DIAGNOSIS — E7800 Pure hypercholesterolemia, unspecified: Secondary | ICD-10-CM | POA: Diagnosis not present

## 2018-05-06 ENCOUNTER — Encounter (HOSPITAL_COMMUNITY): Payer: Medicare HMO

## 2018-05-11 ENCOUNTER — Encounter (HOSPITAL_COMMUNITY): Payer: Medicare HMO

## 2018-05-13 ENCOUNTER — Encounter (HOSPITAL_COMMUNITY): Payer: Medicare HMO

## 2018-05-16 ENCOUNTER — Encounter (HOSPITAL_COMMUNITY): Payer: Medicare HMO

## 2018-05-18 ENCOUNTER — Encounter (HOSPITAL_COMMUNITY): Payer: Medicare HMO

## 2018-05-20 ENCOUNTER — Encounter (HOSPITAL_COMMUNITY): Payer: Medicare HMO

## 2018-05-23 ENCOUNTER — Encounter (HOSPITAL_COMMUNITY): Payer: Medicare HMO

## 2018-05-25 ENCOUNTER — Encounter (HOSPITAL_COMMUNITY): Payer: Medicare HMO

## 2018-05-27 ENCOUNTER — Encounter (HOSPITAL_COMMUNITY): Payer: Medicare HMO

## 2018-05-30 ENCOUNTER — Encounter (HOSPITAL_COMMUNITY): Payer: Medicare HMO

## 2018-06-01 ENCOUNTER — Encounter (HOSPITAL_COMMUNITY): Payer: Medicare HMO

## 2018-06-02 DIAGNOSIS — R69 Illness, unspecified: Secondary | ICD-10-CM | POA: Diagnosis not present

## 2018-06-03 ENCOUNTER — Encounter (HOSPITAL_COMMUNITY): Payer: Medicare HMO

## 2018-06-06 ENCOUNTER — Encounter (HOSPITAL_COMMUNITY): Payer: Medicare HMO

## 2018-06-08 ENCOUNTER — Encounter (HOSPITAL_COMMUNITY): Payer: Medicare HMO

## 2018-06-10 ENCOUNTER — Encounter (HOSPITAL_COMMUNITY): Payer: Medicare HMO

## 2018-06-13 ENCOUNTER — Encounter (HOSPITAL_COMMUNITY): Payer: Medicare HMO

## 2018-06-15 ENCOUNTER — Encounter (HOSPITAL_COMMUNITY): Payer: Medicare HMO

## 2018-06-17 ENCOUNTER — Encounter (HOSPITAL_COMMUNITY): Payer: Medicare HMO

## 2018-06-20 ENCOUNTER — Encounter (HOSPITAL_COMMUNITY): Payer: Medicare HMO

## 2018-06-22 ENCOUNTER — Encounter (HOSPITAL_COMMUNITY): Payer: Medicare HMO

## 2018-08-24 ENCOUNTER — Encounter: Payer: Self-pay | Admitting: Nurse Practitioner

## 2018-08-24 ENCOUNTER — Ambulatory Visit: Payer: Medicare HMO | Admitting: Nurse Practitioner

## 2018-08-24 VITALS — BP 160/90 | HR 60 | Ht 71.0 in | Wt 204.4 lb

## 2018-08-24 DIAGNOSIS — I10 Essential (primary) hypertension: Secondary | ICD-10-CM

## 2018-08-24 DIAGNOSIS — Z953 Presence of xenogenic heart valve: Secondary | ICD-10-CM | POA: Diagnosis not present

## 2018-08-24 NOTE — Patient Instructions (Signed)
We will be checking the following labs today - NONE   Medication Instructions:    Continue with your current medicines.   Try taking the HCTZ in the morning and the Metoprolol in the evening   If you need a refill on your cardiac medications before your next appointment, please call your pharmacy.     Testing/Procedures To Be Arranged:  N/A  Follow-Up:   See Dr. Anne FuSkains in 6 months  See me in 1 year.     At [facility], you and your health needs are our priority.  As part of our continuing mission to provide you with exceptional heart care, we have created designated Provider Care Teams.  These Care Teams include your primary Cardiologist (physician) and Advanced Practice Providers (APPs -  Physician Assistants and Nurse Practitioners) who all work together to provide you with the care you need, when you need it.  Special Instructions:  . Restrict your salt  . Monitor your BP - if still having the high readings - let us know.   Call the [facility] office at [phone] if you have any questions, problems or concerns.

## 2018-08-24 NOTE — Progress Notes (Signed)
CARDIOLOGY OFFICE NOTE  Date:  08/24/2018    Stephen Mullins Date of Birth: [birth year] Medical Record #[id number]  PCP:  [name], MD  Cardiologist:  Rick Duff    Chief Complaint  Patient presents with  . Follow-up    6 month check. Seen for Dr. Anne Fu    History of Present Illness: Stephen Mullins is a 72 y.o. male who presents today for a 6 month check. Seen for Dr. Anne Fu.   He has a history of AVR per PVT back in 12/2017 with #25 mm bioprosthetic valve. Did well post op. LV function is normal. Did have transient PAF - was on amiodarone and Eliqus - now off. Does have dilated aorta noted.   Last seen in June by Dr. Anne Fu. Doing well.   Comes in today. Here alone. He is doing "great". No problems really. Very active. Golfing. Doing basically anything he wants. To have CTA and follow up with PVT in April of 2020. His only concern is that he notes his BP is consistently in the 150's first thing in the morning - then trends down thru the course of the day. He feels this should be better. He has had no orthostasis like previously. He does SBE for dental.   Past Medical History:  Diagnosis Date  . 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 Borderline hyperlipidemia   . Elevated fasting glucose   . Elevated PSA    due to prostatitis  . GERD (gastroesophageal reflux disease)   . Heart murmur   . Hypertension   . Nail fungus   . Thoracic ascending aortic aneurysm [facility])     Past Surgical History:  Procedure Laterality Date  . AORTIC VALVE REPLACEMENT N/A 12/20/2017   Procedure: AORTIC VALVE REPLACEMENT (AVR);  Surgeon: [name], [name], MD;  Location: [facility] OR;  Service: Open Heart Surgery;  Laterality: N/A;  . CATARACT EXTRACTION, BILATERAL    . RIGHT/LEFT HEART CATH AND CORONARY ANGIOGRAPHY N/A 11/09/2017   Procedure: RIGHT/LEFT HEART CATH AND CORONARY ANGIOGRAPHY;  Surgeon: [name], MD;  [facility] INV[facility];  Service: Cardiovascular;  Laterality: N/A;  . TEE WITHOUT CARDIOVERSION N/A 12/20/2017   Procedure: TRANSESOPHAGEAL ECHOCARDIOGRAM (TEE);  Surgeon: [name], [name], MD;  Location: [facility] OR;  Service: Open Heart Surgery;  Laterality: N/A;     Medications: Current Meds  Medication Sig  . acetaminophen (TYLENOL) 500 MG tablet Take 2 tablets (1,000 mg total) by mouth every 6 (six) hours as needed.  [name] aspirin 81 MG tablet Take 81 mg by mouth daily.  . hydrochlorothiazide (HYDRODIURIL) 12.5 MG tablet Take 1 tablet (12.5 mg total) by mouth daily.  . hydrocortisone cream 1 % Apply 1 application topically daily as needed for itching.  . metoprolol succinate (TOPROL-XL) 25 MG 24 hr tablet Take 25 mg by mouth daily.  [name] neomycin-bacitracin-polymyxin (NEOSPORIN) ointment Apply 1 application topically daily as needed for wound care.  [name] oxymetazoline (AFRIN) 0.05 % nasal spray Place 1 spray into both nostrils daily as needed for congestion.     Allergies: Allergies  Allergen Reactions  . Tetanus Toxoid, Adsorbed Other (See Comments)    Pt. States as small child he had tetnus made from horse serum and he turned 'blue' ?? SHORTNESS OF BREATH ??    Social History: The patient  reports that he quit smoking about 16 years ago. He has  never used smokeless tobacco. He reports current alcohol use of about 1.0 - 2.0 standard drinks of alcohol per week. He reports that he does not use drugs.   Family History: The patient's family history includes Prostate cancer in his father.   Review of Systems: Please see the history of present illness.   Otherwise, the review of systems is positive for none.   All other systems are reviewed and negative.   Physical Exam: VS:  BP (!) 160/90 (BP Location: Left Arm, Patient Position: Sitting, Cuff Size: Normal)   Pulse 60   Ht 5\' 11"  (1.803 m)   Wt 204 lb 6.4 oz (92.7 kg)   SpO2 95% Comment: at rest  BMI 28.51 kg/m   .  BMI Body mass index is 28.51 kg/m.  Wt Readings from Last 3 Encounters:  08/24/18 204 lb 6.4 oz (92.7 kg)  03/25/18 195 lb 5.2 oz (88.6 kg)  03/16/18 195 lb 12.8 oz (88.8 kg)    General: Pleasant. Well developed, well nourished and in no acute distress.   HEENT: Normal.  Neck: Supple, no JVD, carotid bruits, or masses noted.  Cardiac: Regular rate and rhythm. Soft outflow murmur. No edema.  Respiratory:  Lungs are clear to auscultation bilaterally with normal work of breathing.  GI: Soft and nontender.  MS: No deformity or atrophy. Gait and ROM intact.  Skin: Warm and dry. Color is normal.  Neuro:  Strength and sensation are intact and no gross focal deficits noted.  Psych: Alert, appropriate and with normal affect.   LABORATORY DATA:  EKG:  EKG is not ordered today.  Lab Results  Component Value Date   WBC 8.3 01/10/2018   HGB 14.5 01/10/2018   HCT 43.9 01/10/2018   PLT 356 01/10/2018   GLUCOSE 96 01/10/2018   ALT 24 01/10/2018   AST 21 01/10/2018   NA 141 01/10/2018   K 4.7 01/10/2018   CL 98 01/10/2018   CREATININE 1.06 01/10/2018   BUN 15 01/10/2018   CO2 26 01/10/2018   TSH 5.220 (H) 01/10/2018   INR 1.49 12/20/2017   HGBA1C 5.8 (H) 12/16/2017       BNP (last 3 results) No results for input(s): BNP in the last 8760 hours.  ProBNP (last 3 results) No results for input(s): PROBNP in the last 8760 hours.   Other Studies Reviewed Today:  Echo Study Conclusions 01/2018  - Left ventricle: The cavity size was normal. Wall thickness was   increased in a pattern of mild LVH. Systolic function was normal.   The estimated ejection fraction was in the range of 60% to 65%.   Doppler parameters are consistent with abnormal left ventricular   relaxation (grade 1 diastolic dysfunction). - Aortic valve: A bioprosthesis was present.  Impressions:  - Normally functioning bioprosthetic AV  Procedure (s): Aortic valve replacement with a 25-mm Edwards  pericardial tissue valve - Inspiris, serial [id number] by Dr. [name] 12/20/2017.   TEE Study Conclusions 12/20/2017  - Left ventricle: The cavity size was normal. Wall thickness was normal. Systolic function was normal. The estimated ejection fraction was in the range of 60% to 65%. Wall motion was normal; there were no regional wall motion abnormalities. - Aortic valve: Normal-sized, noncalcified annulus. Bicuspid; moderately thickened leaflets; fusion of the right-left coronary commissure. Cusp separation was severely reduced. Right coronary and left coronary cusp immobility was noted. Transvalvular velocity was increased, 448 cm/s, due to severe stenosis. Peak gradient 80 mmHg, mean gradient 46 mmHg. AVA (VTI)  1.14 cm2. There was moderate to severe regurgitation directed towards the mitral anterior leaflet. - Mitral valve: Normal-sized, noncalcified annulus. Normal thickness leaflets . Mobility of the posterior leaflet was very mildly restricted. Transvalvular velocity was within the normal range. There was no evidence for stenosis. There was mild regurgitation directed centrally. - Staged echo: Limited Post CPB exam: Good LV function post bypass. EF 50-55%, with no wall motion abnormalities despite pacing. Prosthetic aortic valve is well-seated in the Aortic annulus. No AI seen in LV outflow tract. Post aortic valve replacement images demonstrate no residual valvular insufficiency or perivalvular leak. The mean gradient acorss the new aoric valve is 9 mmHg, with a peak of 17 mmHg. No change post bypass in mitral valve function, mild MR remains.  Impressions:  - Post aortic valve replacement surgery, the prosthetic aortic valve appears to be functioning normally. Excellent prosthetic aortic valve function without evidence for perivalvular leak. Other valves unchanged. No other significant changes from pre-bypass  images   Cardiac Cath Conclusion 11/2017    Prox RCA lesion is 30% stenosed.  Mid RCA lesion is 30% stenosed.  RPDA lesion is 30% stenosed.  Ost Cx to Mid Cx lesion is 20% stenosed.  Ost 1st Mrg lesion is 20% stenosed.  Mid LAD lesion is 20% stenosed.  Ost LM lesion is 20% stenosed.  1. Mild non-obstructive CAD 2. Severe aortic stenosis (mean gradient 35.6 mmHg, peak to peak gradient 36 mmHg, AVA 0.95 cm2).  Recommendations: Continue workup for AVR.    PreCabg Doppler 12/2017 Final Interpretation: Right Carotid: Velocities in the right ICA are consistent with a 1-39% stenosis.  Left Carotid: Velocities in the left ICA are consistent with a 1-39% stenosis. Vertebrals: Bilateral vertebral arteries demonstrate antegrade flow.  Electronically signed by Leonides SakeBrian Chen on 12/16/2017 at 4:28:08 PM.  CTA CHEST IMPRESSION 12/2017: Heavily calcified aortic valve with associated mild aneurysmal dilatation of the ascending thoracic aorta measuring 4.4 cm in greatest diameter.  Aortic aneurysm NOS (ICD10-I71.9).   Electronically Signed By: Irish LackGlenn Yamagata M.D. On: 12/16/2017 16:39    Assessment & Plan:  1. Severe AS - s/p AVR - follow up echo looks good. He is doing great.   2. Post of AF - remains in NSR   3. HTN - running higher at home just in the morning prior to his medicines - he is going to try and split his medicines - could then consider increasing HCTZ - doubt he could tolerate more beta blocker with resting HR of 60 - could consider adding low dose Norvasc at night. He will monitor and let us know. He does need to restrict his salt better.   4. Mild carotid disease - recommend repeat study in 1 to 2 years.   5. Ascending thoracic aortic aneurysm - seeing PVT with repeat scan in April of 2020.   Current medicines are reviewed with the patient today.  The patient does not have concerns regarding medicines other than what has been noted  above.  The following changes have been made:  See above.  Labs/ tests ordered today include:   No orders of the defined types were placed in this encounter.    Disposition:   FU with Dr. Anne FuSkains in 6 months; me in 1 year.    Patient is agreeable to this plan and will call if any problems develop in the interim.   SignedNorma Fredrickson: Geary Rufo, NP  08/24/2018 12:15 PM  [facility] [address]  [place], [place]  [address] Phone: [phone] Fax: [phone]

## 2018-09-22 ENCOUNTER — Telehealth: Payer: Self-pay | Admitting: *Deleted

## 2018-09-22 MED ORDER — NONFORMULARY OR COMPOUNDED ITEM: 3 refills | Status: DC

## 2018-09-22 NOTE — Telephone Encounter (Signed)
[facility] states pr request refill of the nail lacquer. Dr. Bary Castilla refill +3 additional and if pt continues to have a problem needs an appt.

## 2018-10-31 DIAGNOSIS — R69 Illness, unspecified: Secondary | ICD-10-CM | POA: Diagnosis not present

## 2018-11-02 ENCOUNTER — Encounter: Payer: Self-pay | Admitting: Cardiothoracic Surgery

## 2018-11-07 ENCOUNTER — Other Ambulatory Visit: Payer: Self-pay | Admitting: *Deleted

## 2018-11-07 DIAGNOSIS — I712 Thoracic aortic aneurysm, without rupture, unspecified: Secondary | ICD-10-CM

## 2018-11-15 DIAGNOSIS — Z952 Presence of prosthetic heart valve: Secondary | ICD-10-CM | POA: Diagnosis not present

## 2018-11-15 DIAGNOSIS — I10 Essential (primary) hypertension: Secondary | ICD-10-CM | POA: Diagnosis not present

## 2018-11-15 DIAGNOSIS — E7800 Pure hypercholesterolemia, unspecified: Secondary | ICD-10-CM | POA: Diagnosis not present

## 2018-11-15 DIAGNOSIS — R7301 Impaired fasting glucose: Secondary | ICD-10-CM | POA: Diagnosis not present

## 2018-11-15 DIAGNOSIS — Z23 Encounter for immunization: Secondary | ICD-10-CM | POA: Diagnosis not present

## 2018-11-17 DIAGNOSIS — R69 Illness, unspecified: Secondary | ICD-10-CM | POA: Diagnosis not present

## 2018-12-25 DIAGNOSIS — R69 Illness, unspecified: Secondary | ICD-10-CM | POA: Diagnosis not present

## 2019-01-04 ENCOUNTER — Ambulatory Visit: Payer: Medicare HMO | Admitting: Cardiothoracic Surgery

## 2019-01-04 ENCOUNTER — Other Ambulatory Visit: Payer: Medicare HMO

## 2019-01-24 ENCOUNTER — Other Ambulatory Visit: Payer: Self-pay

## 2019-01-24 ENCOUNTER — Other Ambulatory Visit: Payer: Medicare HMO

## 2019-01-25 ENCOUNTER — Ambulatory Visit: Payer: Medicare HMO | Admitting: Cardiothoracic Surgery

## 2019-01-25 ENCOUNTER — Encounter: Payer: Self-pay | Admitting: Cardiothoracic Surgery

## 2019-01-25 ENCOUNTER — Ambulatory Visit
Admission: RE | Admit: 2019-01-25 | Discharge: 2019-01-25 | Disposition: A | Discharge status: Home / Self Care | Payer: Medicare HMO | Source: Ambulatory Visit | Attending: Cardiothoracic Surgery | Admitting: Cardiothoracic Surgery

## 2019-01-25 VITALS — BP 162/89 | HR 56 | Temp 98.1°F | Resp 18 | Ht 71.0 in | Wt 197.0 lb

## 2019-01-25 DIAGNOSIS — I712 Thoracic aortic aneurysm, without rupture, unspecified: Secondary | ICD-10-CM

## 2019-01-25 IMAGING — CT CT ANGIOGRAPHY CHEST
2 of 6 series · 13 of 36 positions shown · IV contrast (iopamidol)
Comparison: [DATE]; [DATE]

CLINICAL DATA: Follow-up thoracic aortic aneurysm. History of
aortic valve replacement.

EXAM:
CT ANGIOGRAPHY CHEST WITH CONTRAST
TECHNIQUE: Multidetector CT imaging of the chest was performed using the
standard protocol during bolus administration of intravenous
contrast. Multiplanar CT image reconstructions and MIPs were
obtained to evaluate the vascular anatomy.
CONTRAST:  75mL [BK] IOPAMIDOL ([BK]) INJECTION 76%
Creatinine was obtained on site at [HOSPITAL] at [REDACTED].
Results: Creatinine 0.9 mg/dL.

[Series 5: cta thorax 2.00 bv36 s3 ax axial arterial · axial · arterial · 0.62mm/px · z∈[+1542,+1828]mm · 12 of 169 slices shown]
[im 13/169  lung]
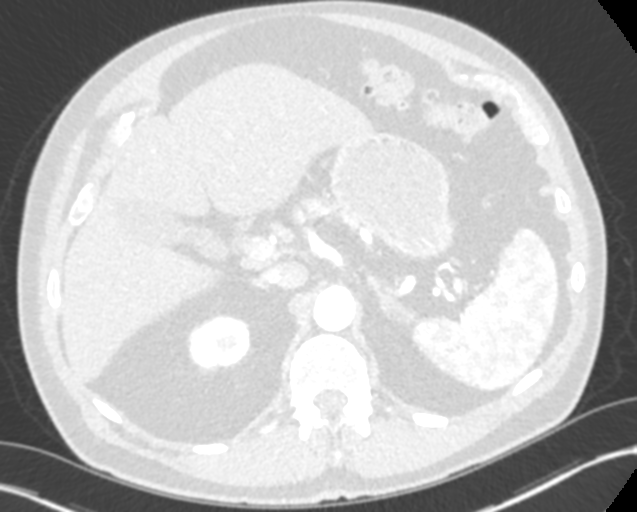
[im 26/169  mediastinal]
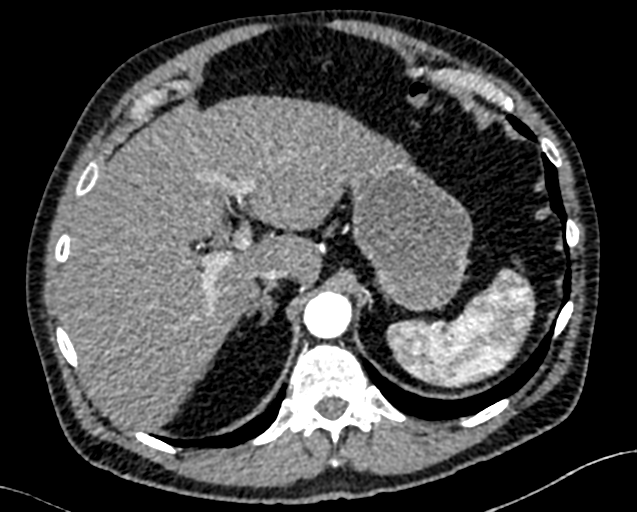
[im 39/169  lung]
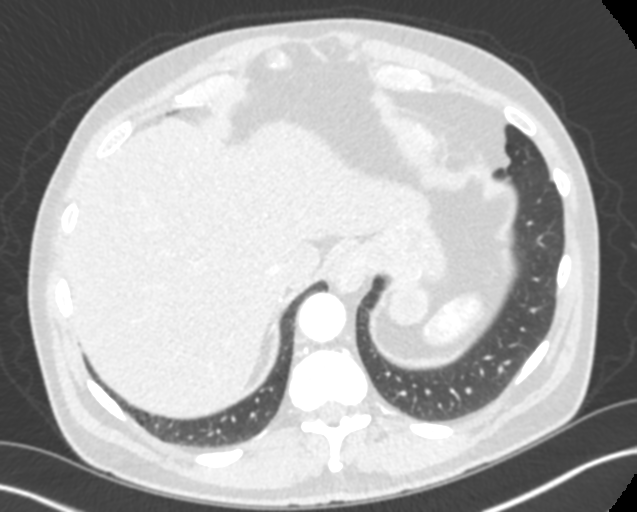
[im 52/169  mediastinal]
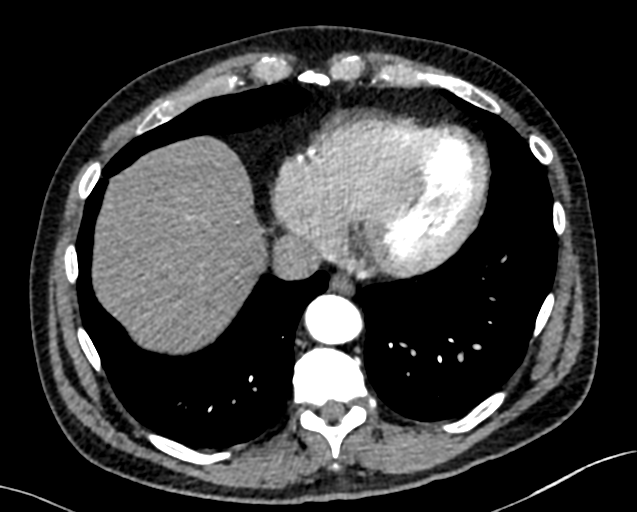
[im 65/169  lung]
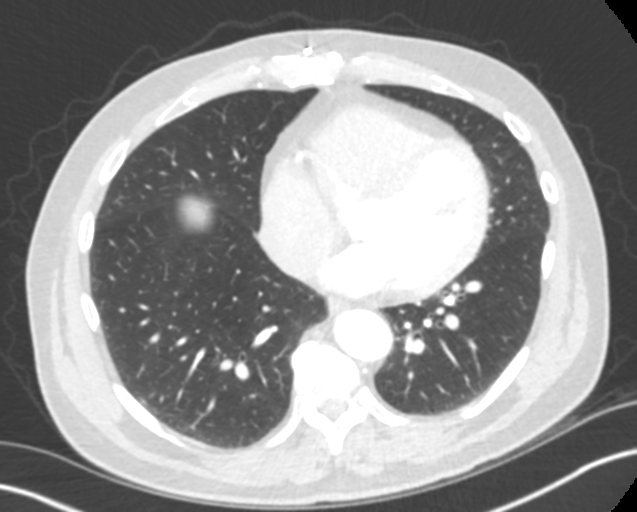
[im 78/169  mediastinal]
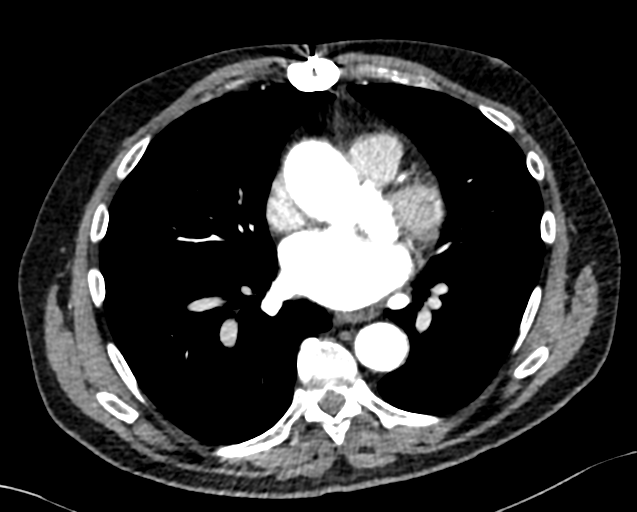
[im 91/169  lung]
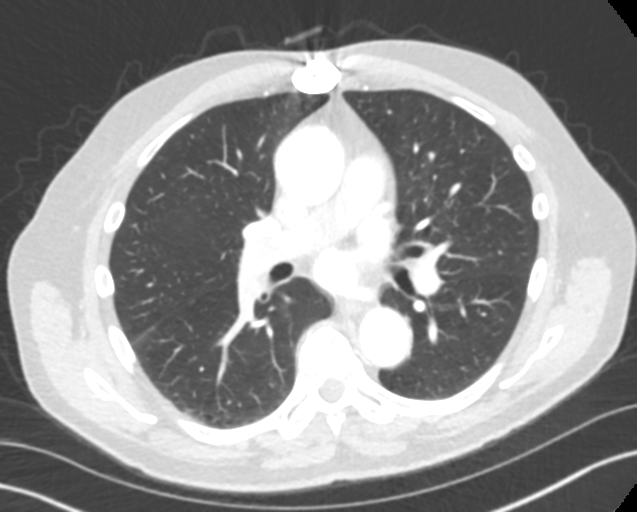
[im 104/169  mediastinal]
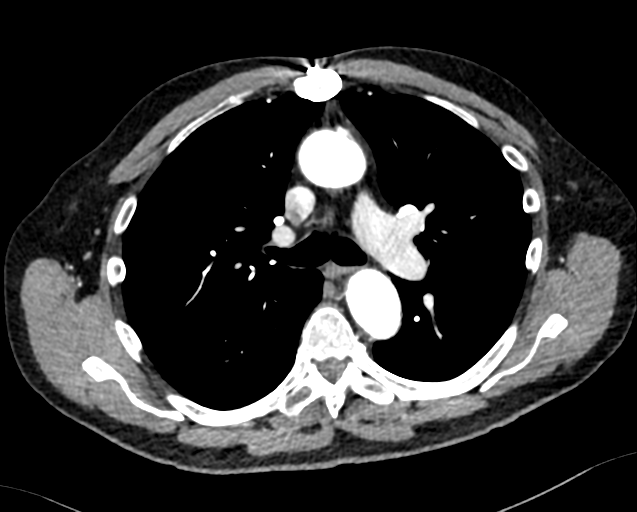
[im 117/169  lung]
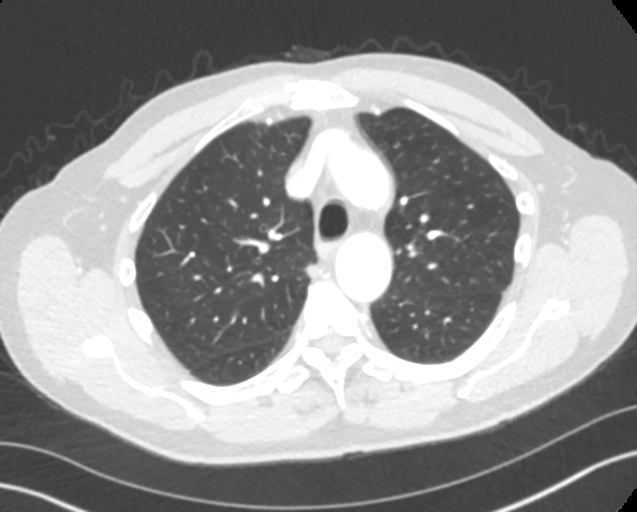
[im 130/169  mediastinal]
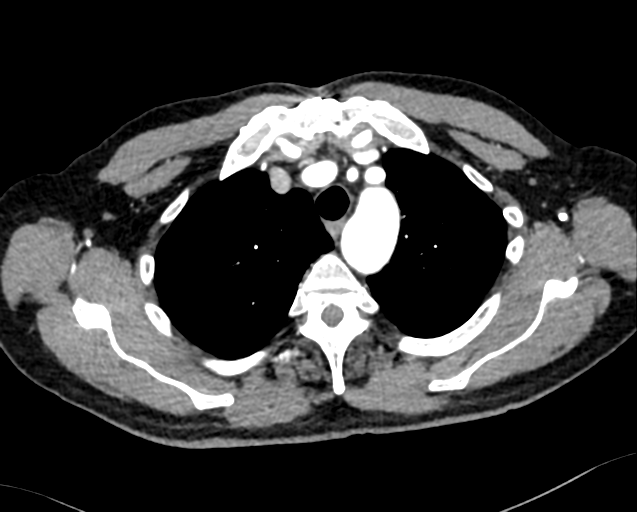
[im 143/169  lung]
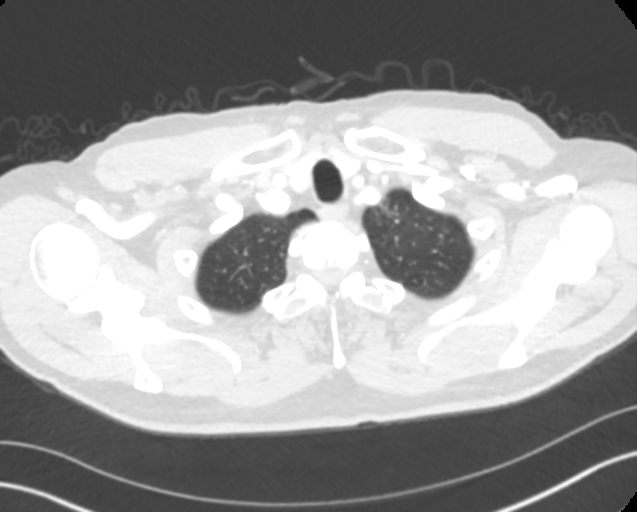
[im 156/169  mediastinal]
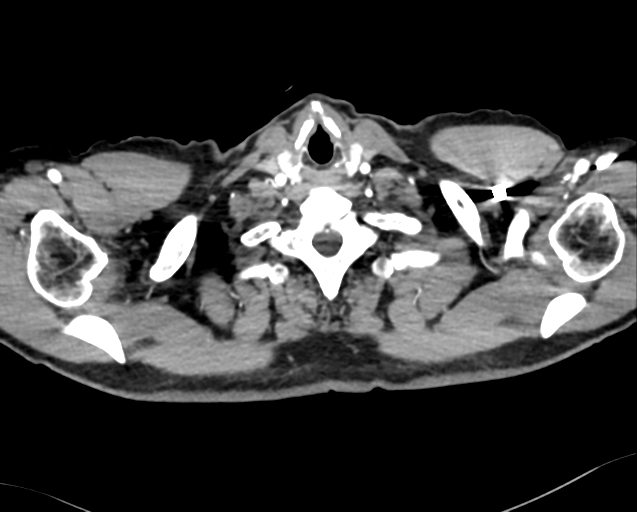

[Series 10: cta thorax 2.00 bv36 s3 cor cor st · coronal · 0.66mm/px · 1 of 159 slices shown]
[im 80/159  mediastinal]
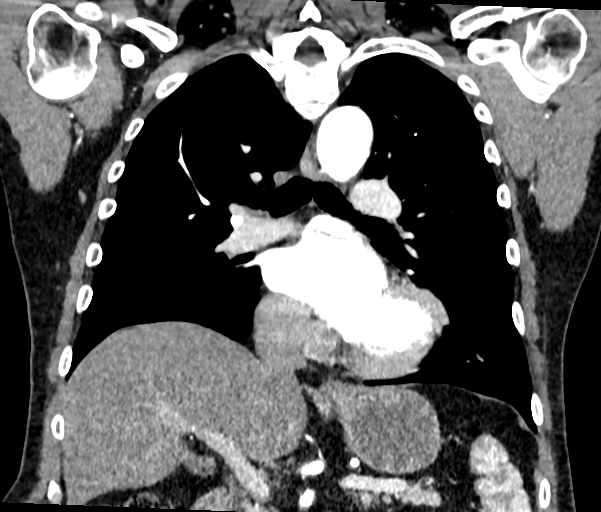

[13 of 36 positions shown; findings below may reference images not displayed]

FINDINGS: Vascular Findings:

Grossly unchanged mild fusiform aneurysmal dilatation of the
ascending thoracic aorta with measurements as follows.

The thoracic aorta tapers to a normal caliber at the aortic arch
however there is unchanged unfolding involving the distal aspect of
the aortic arch with ectasia involving the proximal descending
thoracic aorta measuring approximately 38 mm, grossly unchanged
compared to the [DATE] examination.

No evidence of thoracic aortic dissection or periaortic stranding on
this nongated examination.

Conventional configuration of the aortic arch. The branch vessels of
the aortic arch appear widely patent throughout their imaged
courses.

Post median sternotomy and aortic valve replacement. Borderline
cardiomegaly. Coronary artery calcifications. No pericardial
effusion.

Although this examination was not tailored for the evaluation the
pulmonary arteries, there are no discrete filling defects within the
central pulmonary arterial tree to suggest central pulmonary
embolism.

-------------------------------------------------------------

Thoracic aortic measurements:

Sinotubular junction

37 mm as measured in greatest oblique short axis coronal dimension.

Proximal ascending aorta

42 mm as measured in greatest oblique short axis axial dimension at
the level of the main pulmonary artery (axial image 78, series 5)
and approximately 44 mm in greatest oblique short axis coronal
diameter (coronal image 51, series 10), grossly unchanged compared
to the [BK] examination.

Aortic arch aorta

30 mm as measured in greatest oblique short axis sagittal dimension.

Proximal descending thoracic aorta

33 mm as measured in greatest oblique short axis axial dimension at
the level of the main pulmonary artery.

Distal descending thoracic aorta

29 mm as measured in greatest oblique short axis axial dimension at
the level of the diaphragmatic hiatus.

Review of the MIP images confirms the above findings.

-------------------------------------------------------------

Non-Vascular Findings:

Mediastinum/Lymph Nodes: No bulky mediastinal, hilar or axillary
lymph adenopathy.

Lungs/Pleura: Mild apically predominant paraseptal emphysematous
change. Minimal dependent subpleural ground-glass atelectasis. No
discrete focal airspace opacities. No pleural effusion or
pneumothorax. The central pulmonary airways appear widely patent.

Punctate granuloma within the right lower lobe (image 90, series 7).
No discrete worrisome pulmonary nodules.

Upper abdomen: Limited early arterial phase evaluation of the upper
abdomen is unremarkable.

Musculoskeletal: No acute or aggressive osseous abnormalities.
Stigmata of DISH within midthoracic spine. Mild-to-moderate DDD of
C5-C6 and C6-C7. Note is made of an approximately 1.1 x 0.6 cm
subcutaneous nodule about the left side of the mid back (image 82,
series 5), without associated subcutaneous emphysema, potentially
representative of a sebaceous cyst. Punctate subcentimeter
hypoattenuating right-sided thyroid nodules.
IMPRESSION: 1. Interval median sternotomy and aortic valve repair without
evidence of complication.
2. Stable uncomplicated mild fusiform aneurysmal dilatation of the
ascending thoracic aorta measuring 44 mm in diameter, similar to the
[BK] examination. Aortic aneurysm NOS ([BK]-[BK]).
3. Unchanged fusiform ectasia of the proximal descending thoracic
aorta measuring 38 mm in diameter, also similar to the [BK]
examination.
4. Coronary calcifications.  Aortic Atherosclerosis ([BK]-[BK]).

## 2019-01-25 MED ORDER — IOPAMIDOL (ISOVUE-370) INJECTION 76%
75.0000 mL | Freq: Once | INTRAVENOUS | Status: AC | PRN
  Administered 2019-01-25: 12:00:00 75 mL via INTRAVENOUS

## 2019-01-25 MED ORDER — AMLODIPINE BESYLATE 10 MG PO TABS: 5.0000 mg | ORAL_TABLET | Freq: Every day | ORAL | 2 refills | Status: DC

## 2019-01-25 NOTE — Progress Notes (Signed)
PCP is Juluis RainierBarnes, Elizabeth, MD Referring Provider is Jake BatheSkains, Mark C, MD  Chief Complaint  Patient presents with  . Thoracic Aortic Aneurysm    9 month f/u on TAA, review CTA, s/p AVR 2019    HPI: Patient returns for scheduled annual follow-up CTA for mild asymptomatic ascending fusiform aneurysm measuring 4.4 cm and stable for the past few years.  1 year ago the patient had AVR for aortic stenosis with a 25 mm bioprosthetic valve.  He is doing well and feels great.  He denies chest pain.  His blood pressure has been an issue.  He takes metoprolol with a baseline heart rate of 60.  He has had consistent blood pressure measurements of 160 systolic.  We will start him on low-dose amlodipine 5 mg nightly in addition to his HCTZ and metoprolol.   Past Medical History:  Diagnosis Date  . 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 Borderline hyperlipidemia   . Elevated fasting glucose   . Elevated PSA    due to prostatitis  . GERD (gastroesophageal reflux disease)   . Heart murmur   . Hypertension   . Nail fungus   . Thoracic ascending aortic aneurysm [facility](H[facility])     Past Surgical History:  Procedure Laterality Date  . AORTIC VALVE REPLACEMENT N/A 12/20/2017   Procedure: AORTIC VALVE REPLACEMENT (AVR);  Surgeon: Donata ClayVan Trigt, Theron AristaPeter, MD;  [facility];  Service: Open Heart Surgery;  Laterality: N/A;  . CATARACT EXTRACTION, BILATERAL    . RIGHT/LEFT HEART CATH AND CORONARY ANGIOGRAPHY N/A 11/09/2017   Procedure: RIGHT/LEFT HEART CATH AND CORONARY ANGIOGRAPHY;  Surgeon: Kathleene HazelMcAlhany, Christopher D, MD;  [facility] INVASIVE CV LAB;  Service: Cardiovascular;  Laterality: N/A;  . TEE WITHOUT CARDIOVERSION N/A 12/20/2017   Procedure: TRANSESOPHAGEAL ECHOCARDIOGRAM (TEE);  Surgeon: Donata ClayVan Trigt, Theron AristaPeter, MD;  [facility];  Service: Open Heart Surgery;  Laterality: N/A;    Family History  Problem Relation Age of Onset  . Prostate cancer  Father     Social History Social History   Tobacco Use  . Smoking status: Former Smoker    Last attempt to quit: 10/20/2001    Years since quitting: 17.2  . Smokeless tobacco: Never Used  Substance Use Topics  . Alcohol use: Yes    Alcohol/week: 1.0 - 2.0 standard drinks    Types: 1 - 2 Glasses of wine per week  . Drug use: No    Current Outpatient Medications  Medication Sig Dispense Refill  . acetaminophen (TYLENOL) 500 MG tablet Take 2 tablets (1,000 mg total) by mouth every 6 (six) hours as needed. 30 tablet 0  . aspirin 81 MG tablet Take 81 mg by mouth daily.    . hydrochlorothiazide (HYDRODIURIL) 12.5 MG tablet Take 1 tablet (12.5 mg total) by mouth daily. 90 tablet 3  . hydrocortisone cream 1 % Apply 1 application topically daily as needed for itching.    . metoprolol succinate (TOPROL-XL) 25 MG 24 hr tablet Take 25 mg by mouth daily.    [name]. neomycin-bacitracin-polymyxin (NEOSPORIN) ointment Apply 1 application topically daily as needed for wound care.    . NONFORMULARY OR COMPOUNDED ITEM [facility]: Antifungal nail lacquer - Fluconazole 2%, Terbinafine 1%, DMSO. 120 each 3  . oxymetazoline (AFRIN) 0.05 % nasal spray Place 1 spray into both nostrils daily as needed for congestion.    [name]. amLODipine (NORVASC) 10 MG tablet Take 0.5 tablets (5  mg total) by mouth daily. Take at bedtime 60 tablet 2   No current facility-administered medications for this visit.     Allergies  Allergen Reactions  . Tetanus Toxoid, Adsorbed Other (See Comments)    Pt. States as small child he had tetnus made from horse serum and he turned 'blue' ?? SHORTNESS OF BREATH ??    Review of Systems  No weight loss No cough or fever No chest pain No edema No dizziness No palpitations No difficulty swallowing  BP (!) 162/89   Pulse (!) 56   Temp 98.1 F (36.7 C)   Resp 18   Ht 5\' 11"  (1.803 m)   Wt 197 lb (89.4 kg)   SpO2 97% Comment: on RA  BMI 27.48 kg/m  Physical Exam       Exam    General- alert and comfortable    Neck- no JVD, no cervical adenopathy palpable, no carotid bruit   Lungs- clear without rales, wheezes   Cor- regular rate and rhythm, slight flow murmur through aortic valve, no diastolic murmur, no, gallop   Abdomen- soft, non-tender   Extremities - warm, non-tender, minimal edema   Neuro- oriented, appropriate, no focal weakness   Diagnostic Tests: CT images personally reviewed showing smooth ascending fusiform aneurysm measuring 4.4 cm in diameter.  Stable.  Impression: Patient doing well Risk for dissection with his ascending aortic diameter 4.4 cm is less than 1%. Most important intervention is adequate blood pressure control to keep systolic blood pressure less than 150.  We will add Norvasc 5 mg p.o. nightly  Plan: Return with follow-up CTA to assess thoracic aorta in 18 months.  Mikey Bussing, MD [facility] [phone]

## 2019-02-06 ENCOUNTER — Telehealth: Payer: Self-pay | Admitting: *Deleted

## 2019-02-06 MED ORDER — NONFORMULARY OR COMPOUNDED ITEM: 11 refills | Status: DC

## 2019-02-06 NOTE — Telephone Encounter (Signed)
Pt called request refill of the toenail solution.

## 2019-02-06 NOTE — Telephone Encounter (Signed)
I informed pt our office no longer used [employer], they had moved to [facility]. I informed pt [address] - Stephen Mullins [phone], would call him with insurance coverage and delivery information. Faxed orders to [employer].

## 2019-02-10 DIAGNOSIS — E1169 Type 2 diabetes mellitus with other specified complication: Secondary | ICD-10-CM | POA: Diagnosis not present

## 2019-02-10 DIAGNOSIS — E7800 Pure hypercholesterolemia, unspecified: Secondary | ICD-10-CM | POA: Diagnosis not present

## 2019-02-23 DIAGNOSIS — E1169 Type 2 diabetes mellitus with other specified complication: Secondary | ICD-10-CM | POA: Diagnosis not present

## 2019-02-23 DIAGNOSIS — Z79899 Other long term (current) drug therapy: Secondary | ICD-10-CM | POA: Diagnosis not present

## 2019-02-23 DIAGNOSIS — I10 Essential (primary) hypertension: Secondary | ICD-10-CM | POA: Diagnosis not present

## 2019-02-23 DIAGNOSIS — R69 Illness, unspecified: Secondary | ICD-10-CM | POA: Diagnosis not present

## 2019-02-23 DIAGNOSIS — E7800 Pure hypercholesterolemia, unspecified: Secondary | ICD-10-CM | POA: Diagnosis not present

## 2019-03-16 DIAGNOSIS — E7800 Pure hypercholesterolemia, unspecified: Secondary | ICD-10-CM | POA: Diagnosis not present

## 2019-03-16 DIAGNOSIS — E1169 Type 2 diabetes mellitus with other specified complication: Secondary | ICD-10-CM | POA: Diagnosis not present

## 2019-03-16 DIAGNOSIS — Z79899 Other long term (current) drug therapy: Secondary | ICD-10-CM | POA: Diagnosis not present

## 2019-03-16 DIAGNOSIS — I10 Essential (primary) hypertension: Secondary | ICD-10-CM | POA: Diagnosis not present

## 2019-04-14 ENCOUNTER — Other Ambulatory Visit: Payer: Self-pay | Admitting: Cardiology

## 2019-04-15 DIAGNOSIS — R69 Illness, unspecified: Secondary | ICD-10-CM | POA: Diagnosis not present

## 2019-04-27 ENCOUNTER — Other Ambulatory Visit: Payer: Self-pay | Admitting: Cardiothoracic Surgery

## 2019-05-02 ENCOUNTER — Encounter: Payer: Medicare HMO | Attending: Family Medicine | Admitting: Dietician

## 2019-05-02 ENCOUNTER — Other Ambulatory Visit: Payer: Self-pay

## 2019-05-02 DIAGNOSIS — E119 Type 2 diabetes mellitus without complications: Secondary | ICD-10-CM

## 2019-05-03 ENCOUNTER — Encounter: Payer: Self-pay | Admitting: Dietician

## 2019-05-03 NOTE — Progress Notes (Signed)
Patient was seen on 05/02/2019 for the first of a series of three diabetes self-management courses at [facility].  Patient Education Plan per assessed needs and concerns is to attend three course education program for Diabetes Self Management Education.  The following learning objectives were met by the patient during this class:  Describe diabetes, types of diabetes and pathophysiology  State some common risk factors for diabetes  Defines the role of glucose and insulin  Describe the relationship between diabetes and cardiovascular and other risks  State the members of the Healthcare Team  States the rationale for glucose monitoring and when to test  State their individual Target Range  State the importance of logging glucose readings and how to interpret the readings  Identifies A1C target  Explain the correlation between A1c and eAG values  State symptoms and treatment of high blood glucose and low blood glucose  Explain proper technique for glucose testing and identify proper sharps disposal  Handouts given during class include:  How to Thrive:  A Guide for Your Journey with Diabetes by the ADA  Meal Plan Card and carbohydrate content list  Dietary intake form  Low Sodium Flavoring Tips  Types of Fats  Dining Out  Label reading  Snack list  Planning a balanced meal  The diabetes portion plate  Diabetes Resources  A1c to eAG Conversion Chart  Blood Glucose Log  Diabetes Recommended Care Schedule  Support Group  Diabetes Success Plan  Core Class Satisfaction Survey   Follow-Up Plan:  Attend core 2   

## 2019-05-09 ENCOUNTER — Other Ambulatory Visit: Payer: Self-pay

## 2019-05-09 ENCOUNTER — Encounter: Payer: Medicare HMO | Attending: Family Medicine | Admitting: Dietician

## 2019-05-09 ENCOUNTER — Encounter: Payer: Self-pay | Admitting: Dietician

## 2019-05-09 DIAGNOSIS — E119 Type 2 diabetes mellitus without complications: Secondary | ICD-10-CM

## 2019-05-09 NOTE — Progress Notes (Signed)
Patient was seen on 05/09/2019 for the second of a series of three diabetes self-management courses at the [facility]. The following learning objectives were met by the patient during this class:   Describe the role of different macronutrients on glucose  Explain how carbohydrates affect blood glucose  State what foods contain the most carbohydrates  Demonstrate carbohydrate counting  Demonstrate how to read Nutrition Facts food label  Describe effects of various fats on heart health  Describe the importance of good nutrition for health and healthy eating strategies  Describe techniques for managing your shopping, cooking and meal planning  List strategies to follow meal plan when dining out  Describe the effects of alcohol on glucose and how to use it safely  Goals:  Follow Diabetes Meal Plan as instructed  Aim to spread carbs evenly throughout the day  Aim for 3 meals per day and snacks as needed Include lean protein foods to meals/snacks  Monitor glucose levels as instructed by your doctor   Follow-Up Plan:  Attend Core 3  Work towards following your personal food plan.   

## 2019-05-16 ENCOUNTER — Encounter: Payer: Self-pay | Admitting: Cardiology

## 2019-05-16 ENCOUNTER — Other Ambulatory Visit: Payer: Self-pay

## 2019-05-16 ENCOUNTER — Encounter: Payer: Medicare HMO | Admitting: Dietician

## 2019-05-16 ENCOUNTER — Encounter: Payer: Self-pay | Admitting: Dietician

## 2019-05-16 DIAGNOSIS — E119 Type 2 diabetes mellitus without complications: Secondary | ICD-10-CM

## 2019-05-16 DIAGNOSIS — R7301 Impaired fasting glucose: Secondary | ICD-10-CM | POA: Diagnosis not present

## 2019-05-16 DIAGNOSIS — E7800 Pure hypercholesterolemia, unspecified: Secondary | ICD-10-CM | POA: Diagnosis not present

## 2019-05-16 NOTE — Progress Notes (Signed)
Patient was seen on 05/16/2019 for the third of a series of three diabetes self-management courses at the [facility].   Stephen Mullins the amount of activity recommended for healthy living . Describe activities suitable for individual needs . Identify ways to regularly incorporate activity into daily life . Identify barriers to activity and ways to over come these barriers  Identify diabetes medications being personally used and their primary action for lowering glucose and possible side effects . Describe role of stress on blood glucose and develop strategies to address psychosocial issues . Identify diabetes complications and ways to prevent them  Explain how to manage diabetes during illness . Evaluate success in meeting personal goal . Establish 2-3 goals that they will plan to diligently work on  Goals:   I will count my carb choices at most meals and snacks  I will eat less unhealthy fats   I will test my glucose at least 1 times a day, 7 days a week  Your patient has identified these potential barriers to change:  Motivation  Your patient has identified their diabetes self-care support plan as  Family Support   Plan:  Attend Support Group as desired

## 2019-06-04 DIAGNOSIS — R69 Illness, unspecified: Secondary | ICD-10-CM | POA: Diagnosis not present

## 2019-06-12 DIAGNOSIS — R69 Illness, unspecified: Secondary | ICD-10-CM | POA: Diagnosis not present

## 2019-06-22 DIAGNOSIS — E1169 Type 2 diabetes mellitus with other specified complication: Secondary | ICD-10-CM | POA: Diagnosis not present

## 2019-06-22 DIAGNOSIS — E7800 Pure hypercholesterolemia, unspecified: Secondary | ICD-10-CM | POA: Diagnosis not present

## 2019-06-22 DIAGNOSIS — I10 Essential (primary) hypertension: Secondary | ICD-10-CM | POA: Diagnosis not present

## 2019-06-22 DIAGNOSIS — Z952 Presence of prosthetic heart valve: Secondary | ICD-10-CM | POA: Diagnosis not present

## 2019-06-22 DIAGNOSIS — I719 Aortic aneurysm of unspecified site, without rupture: Secondary | ICD-10-CM | POA: Diagnosis not present

## 2019-07-07 ENCOUNTER — Other Ambulatory Visit: Payer: Self-pay | Admitting: Cardiology

## 2019-07-12 DIAGNOSIS — R69 Illness, unspecified: Secondary | ICD-10-CM | POA: Diagnosis not present

## 2019-08-10 NOTE — Progress Notes (Signed)
CARDIOLOGY OFFICE NOTE  Date:  08/15/2019    Stephen Mullins Date of Birth: 1945-10-16 Medical Record #[id number]  PCP:  Juluis RainierBarnes, Elizabeth, MD  Cardiologist:  Rick [facility]uff[facility]  Chief Complaint  Patient presents with  . Follow-up    Seen for Dr. Anne FuSkains    History of Present Illness: Stephen Mullins is a 73 y.o. male who presents today for a one year check.  Seen for Dr. Anne FuSkains.   He has a history of AVR per [facility] back in 12/2017 with #25 mm bioprosthetic valve. Did well post op. LV function is normal. Did have transient PAF - was on amiodarone and Eliqus - now off. Does have dilated aorta noted.   Last seen in June of 2019 by Dr. Anne FuSkains. Doing well. I saw him last December - continued to do well but BP was trending up during the day - I advised splitting his medicines up rather than taking all at once.   The patient does not have symptoms concerning for COVID-19 infection (fever, chills, cough, or new shortness of breath).   Comes in today. Here alone. He is doing well. Has had a good year. No chest pain. Breathing is good. BP is much better at home - was 132/70 this morning. He is walking daily. He is down 10 pounds. Labs from September noted. He feels like he is doing well with no complaint. Feels good on his current regimen of medicines. Not dizzy.   Past Medical History:  Diagnosis Date  . 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 Borderline hyperlipidemia   . Diabetes mellitus without complication (HCC)   . Elevated fasting glucose   . Elevated PSA    due to prostatitis  . GERD (gastroesophageal reflux disease)   . Heart murmur   . Hypertension   . Nail fungus   . Thoracic ascending aortic aneurysm [facility](HCC)     Past Surgical History:  Procedure Laterality Date  . AORTIC VALVE REPLACEMENT N/A 12/20/2017   Procedure: AORTIC VALVE REPLACEMENT (AVR);  Surgeon: Donata ClayVan Trigt, Theron AristaPeter, MD;  [facility];  Service: Open Heart Surgery;  Laterality: N/A;  . CATARACT EXTRACTION, BILATERAL    . RIGHT/LEFT HEART CATH AND CORONARY ANGIOGRAPHY N/A 11/09/2017   Procedure: RIGHT/LEFT HEART CATH AND CORONARY ANGIOGRAPHY;  Surgeon: Kathleene HazelMcAlhany, Christopher D, MD;  Location: [facility] INVASIVE CV LAB;  Service: Cardiovascular;  Laterality: N/A;  . TEE WITHOUT CARDIOVERSION N/A 12/20/2017   Procedure: TRANSESOPHAGEAL ECHOCARDIOGRAM (TEE);  Surgeon: Donata ClayVan Trigt, Theron AristaPeter, MD;  [facility];  Service: Open Heart Surgery;  Laterality: N/A;     Medications: Current Meds  Medication Sig  . acetaminophen (TYLENOL) 500 MG tablet Take 2 tablets (1,000 mg total) by mouth every 6 (six) hours as needed.  [name]. amoxicillin (AMOXIL) 500 MG capsule TAKE 4 TABLETS BY MOUTH 1 HOUR BEFORE DENTAL PROCEDURE  . aspirin 81 MG tablet Take 81 mg by mouth daily.  . hydrochlorothiazide (HYDRODIURIL) 12.5 MG tablet TAKE 1 TABLET BY MOUTH ONCE DAILY . APPOINTMENT REQUIRED FOR FUTURE REFILLS  . hydrocortisone cream 1 % Apply 1 application topically daily as needed for itching.  [name]. lisinopril (ZESTRIL) 5 MG tablet Take 2.5 mg by mouth daily.  . metoprolol succinate (TOPROL-XL) 25 MG 24 hr tablet Take 25 mg by mouth daily.  [name]. neomycin-bacitracin-polymyxin (NEOSPORIN) ointment Apply 1 application topically daily as needed for wound care.  . NONFORMULARY  OR COMPOUNDED ITEM [facility]: Antifungal nail lacquer - Fluconazole 2%, Terbinafine 1%, DMSO.  [name] NONFORMULARY OR COMPOUNDED ITEM [facility]:  Antifungal cream - Terbinafine 3%, Fluconazole 2%, Tea Tree Oil 5%, Urea 10%, Ibuprofen 2% in DMSO Suspension #86ml. Apply to the affected  Nail(s) once (at Bedtime( or twice daily.  [name] oxymetazoline (AFRIN) 0.05 % nasal spray Place 1 spray into both nostrils daily as needed for congestion.     Allergies: Allergies  Allergen Reactions  . Tetanus Toxoid, Adsorbed Other (See Comments)    Pt. States as small child he had tetnus made from horse  serum and he turned 'blue' ?? SHORTNESS OF BREATH ??    Social History: The patient  reports that he quit smoking about 17 years ago. He has never used smokeless tobacco. He reports current alcohol use of about 1.0 - 2.0 standard drinks of alcohol per week. He reports that he does not use drugs.   Family History: The patient's family history includes Prostate cancer in his father.   Review of Systems: Please see the history of present illness.   All other systems are reviewed and negative.   Physical Exam: VS:  BP (!) 150/82   Pulse (!) 52   Ht 5\' 11"  (1.803 m)   Wt 194 lb 1.9 oz (88.1 kg)   SpO2 96%   BMI 27.07 kg/m  .  BMI Body mass index is 27.07 kg/m.  Wt Readings from Last 3 Encounters:  08/15/19 194 lb 1.9 oz (88.1 kg)  05/03/19 193 lb (87.5 kg)  01/25/19 197 lb (89.4 kg)    General: Pleasant. Well developed, well nourished and in no acute distress.   HEENT: Normal.  Neck: Supple, no JVD, carotid bruits, or masses noted.  Cardiac: Regular rate and rhythm. Soft outflow murmur. No edema.  Respiratory:  Lungs are clear to auscultation bilaterally with normal work of breathing.  GI: Soft and nontender.  MS: No deformity or atrophy. Gait and ROM intact.  Skin: Warm and dry. Color is normal.  Neuro:  Strength and sensation are intact and no gross focal deficits noted.  Psych: Alert, appropriate and with normal affect.   LABORATORY DATA:  EKG:  EKG is ordered today. This demonstrates sinus brady - HR is 52 today.  Lab Results  Component Value Date   WBC 8.3 01/10/2018   HGB 14.5 01/10/2018   HCT 43.9 01/10/2018   PLT 356 01/10/2018   GLUCOSE 96 01/10/2018   ALT 24 01/10/2018   AST 21 01/10/2018   NA 141 01/10/2018   K 4.7 01/10/2018   CL 98 01/10/2018   CREATININE 1.06 01/10/2018   BUN 15 01/10/2018   CO2 26 01/10/2018   TSH 5.220 (H) 01/10/2018   INR 1.49 12/20/2017   HGBA1C 5.8 (H) 12/16/2017       BNP (last 3 results) No results for input(s): BNP  in the last 8760 hours.  ProBNP (last 3 results) No results for input(s): PROBNP in the last 8760 hours.   Other Studies Reviewed Today:  CTA CHEST 01/2019 IMPRESSION: 1. Interval median sternotomy and aortic valve repair without evidence of complication. 2. Stable uncomplicated mild fusiform aneurysmal dilatation of the ascending thoracic aorta measuring 44 mm in diameter, similar to the 2010 examination. Aortic aneurysm NOS (ICD10-I71.9). 3. 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 4. Coronary calcifications.  Aortic Atherosclerosis (ICD10-I70.0).  Electronically Signed   By: Jenny Reichmann  Watts M.D.   On: 01/25/2019 12:18  Echo Study Conclusions 01/2018  - Left ventricle: The cavity size was normal. Wall thickness was increased in a pattern of mild LVH. Systolic function was normal. The estimated ejection fraction was in the range of 60% to 65%. Doppler parameters are consistent with abnormal left ventricular relaxation (grade 1 diastolic dysfunction). - Aortic valve: A bioprosthesis was present.  Impressions:  - Normally functioning bioprosthetic AV  Procedure (s): Aortic valve replacement with a 25-mm Edwards pericardial tissue valve - Inspiris, serial [id number] by Dr. [name] 12/20/2017.   TEEStudy Conclusions4/15/2019  - Left ventricle: The cavity size was normal. Wall thickness was normal. Systolic function was normal. The estimated ejection fraction was in the range of 60% to 65%. Wall motion was normal; there were no regional wall motion abnormalities. - Aortic valve: Normal-sized, noncalcified annulus. Bicuspid; moderately thickened leaflets; fusion of the right-left coronary commissure. Cusp separation was severely reduced. Right coronary and left coronary cusp immobility was noted. Transvalvular velocity was increased, 448 cm/s, due  to severe stenosis. Peak gradient 80 mmHg, mean gradient 46 mmHg. AVA (VTI) 1.14 cm2. There was moderate to severe regurgitation directed towards the mitral anterior leaflet. - Mitral valve: Normal-sized, noncalcified annulus. Normal thickness leaflets . Mobility of the posterior leaflet was very mildly restricted. Transvalvular velocity was within the normal range. There was no evidence for stenosis. There was mild regurgitation directed centrally. - Staged echo: Limited Post CPB exam: Good LV function post bypass. EF 50-55%, with no wall motion abnormalities despite pacing. Prosthetic aortic valve is well-seated in the Aortic annulus. No AI seen in LV outflow tract. Post aortic valve replacement images demonstrate no residual valvular insufficiency or perivalvular leak. The mean gradient acorss the new aoric valve is 9 mmHg, with a peak of 17 mmHg. No change post bypass in mitral valve function, mild MR remains.  Impressions:  - Post aortic valve replacement surgery, the prosthetic aortic valve appears to be functioning normally. Excellent prosthetic aortic valve function without evidence for perivalvular leak. Other valves unchanged. No other significant changes from pre-bypass images   Cardiac CathConclusion3/2019    Prox RCA lesion is 30% stenosed.  Mid RCA lesion is 30% stenosed.  RPDA lesion is 30% stenosed.  Ost Cx to Mid Cx lesion is 20% stenosed.  Ost 1st Mrg lesion is 20% stenosed.  Mid LAD lesion is 20% stenosed.  Ost LM lesion is 20% stenosed.  1. Mild non-obstructive CAD 2. Severe aortic stenosis (mean gradient 35.6 mmHg, peak to peak gradient 36 mmHg, AVA 0.95 cm2).  Recommendations: Continue workup for AVR.   PreCabg Doppler 12/2017 Final Interpretation: Right Carotid: Velocities in the right ICA are consistent with a 1-39% stenosis.  Left Carotid: Velocities in the left ICA are consistent with a  1-39% stenosis. Vertebrals: Bilateral vertebral arteries demonstrate antegrade flow.  Electronically signed by Leonides Sake on 12/16/2017 at 4:28:08 PM.  CTA CHESTIMPRESSION4/2019: Heavily calcified aortic valve with associated mild aneurysmal dilatation of the ascending thoracic aorta measuring 4.4 cm in greatest diameter.  Aortic aneurysm NOS (ICD10-I71.9).   Electronically Signed By: Irish Lack M.D. On: 12/16/2017 16:39    Assessment & Plan:  1. History of severe AS - s/p AVR from 12/2017 - continues to do well - doing SBE. Would plan on repeat study 08/2020.   2. Post op AF - has not recurred - in sinus on exam and EKG today.  3. Resting bradycardia - not symptomatic. No changes made.   4. HTN -  has much better control at home - no changes made.   5. HLD - he does not wish to take statin. Labs from September from PCP noted.   6. Ascending thoracic aneurysm - scan from May was stable - he follows with [facility]. Reminded of precautions.   7. COVID-19 Education: The signs and symptoms of COVID-19 were discussed with the patient and how to seek care for testing (follow up with PCP or arrange E-visit).  The importance of social distancing, staying at home, hand hygiene and wearing a mask when out in public were discussed today.  Current medicines are reviewed with the patient today.  The patient does not have concerns regarding medicines other than what has been noted above.  The following changes have been made:  See above.  Labs/ tests ordered today include:   No orders of the defined types were placed in this encounter.    Disposition:   FU with me in about 9 months.    Patient is agreeable to this plan and will call if any problems develop in the interim.   SignedNorma Fredrickson, NP  08/15/2019 10:03 AM  [facility] [address] [place], [place]  [address] Phone: [phone] Fax: [phone]

## 2019-08-15 ENCOUNTER — Encounter: Payer: Self-pay | Admitting: Nurse Practitioner

## 2019-08-15 ENCOUNTER — Ambulatory Visit: Payer: Medicare HMO | Admitting: Nurse Practitioner

## 2019-08-15 ENCOUNTER — Other Ambulatory Visit: Payer: Self-pay

## 2019-08-15 VITALS — BP 150/82 | HR 52 | Ht 71.0 in | Wt 194.1 lb

## 2019-08-15 DIAGNOSIS — Z952 Presence of prosthetic heart valve: Secondary | ICD-10-CM | POA: Diagnosis not present

## 2019-08-15 DIAGNOSIS — Z7189 Other specified counseling: Secondary | ICD-10-CM

## 2019-08-15 DIAGNOSIS — I10 Essential (primary) hypertension: Secondary | ICD-10-CM

## 2019-08-15 DIAGNOSIS — I712 Thoracic aortic aneurysm, without rupture, unspecified: Secondary | ICD-10-CM

## 2019-08-15 DIAGNOSIS — Z953 Presence of xenogenic heart valve: Secondary | ICD-10-CM | POA: Diagnosis not present

## 2019-08-15 DIAGNOSIS — Z79899 Other long term (current) drug therapy: Secondary | ICD-10-CM

## 2019-08-15 DIAGNOSIS — I480 Paroxysmal atrial fibrillation: Secondary | ICD-10-CM

## 2019-08-15 NOTE — Patient Instructions (Addendum)
After Visit Summary:  We will be checking the following labs today - NONE   Medication Instructions:    Continue with your current medicines.    If you need a refill on your cardiac medications before your next appointment, please call your pharmacy.     Testing/Procedures To Be Arranged:  N/A  Follow-Up:   See me in about 9 months    At [facility], you and your health needs are our priority.  As part of our continuing mission to provide you with exceptional heart care, we have created designated Provider Care Teams.  These Care Teams include your primary Cardiologist (physician) and Advanced Practice Providers (APPs -  Physician Assistants and Nurse Practitioners) who all work together to provide you with the care you need, when you need it.  Special Instructions:  . Stay safe, stay home, wash your hands for at least 20 seconds and wear a mask when out in public.  . It was good to talk with you today.    Call the [place] office at [phone] if you have any questions, problems or concerns.

## 2019-08-16 ENCOUNTER — Other Ambulatory Visit (INDEPENDENT_AMBULATORY_CARE_PROVIDER_SITE_OTHER): Payer: Medicare HMO | Admitting: *Deleted

## 2019-08-16 DIAGNOSIS — Z953 Presence of xenogenic heart valve: Secondary | ICD-10-CM

## 2019-08-16 NOTE — Progress Notes (Signed)
error 

## 2019-09-25 ENCOUNTER — Other Ambulatory Visit: Payer: Self-pay | Admitting: Cardiothoracic Surgery

## 2019-09-25 DIAGNOSIS — R69 Illness, unspecified: Secondary | ICD-10-CM | POA: Diagnosis not present

## 2019-09-28 ENCOUNTER — Other Ambulatory Visit: Payer: Self-pay | Admitting: *Deleted

## 2019-09-28 ENCOUNTER — Encounter: Payer: Self-pay | Admitting: Cardiothoracic Surgery

## 2019-09-28 DIAGNOSIS — Z953 Presence of xenogenic heart valve: Secondary | ICD-10-CM

## 2019-09-28 MED ORDER — AMOXICILLIN 500 MG PO CAPS: ORAL_CAPSULE | ORAL | 1 refills | Status: DC

## 2019-10-10 ENCOUNTER — Other Ambulatory Visit: Payer: Self-pay | Admitting: Nurse Practitioner

## 2019-10-23 DIAGNOSIS — I10 Essential (primary) hypertension: Secondary | ICD-10-CM | POA: Diagnosis not present

## 2019-10-23 DIAGNOSIS — E1169 Type 2 diabetes mellitus with other specified complication: Secondary | ICD-10-CM | POA: Diagnosis not present

## 2019-10-23 DIAGNOSIS — E7800 Pure hypercholesterolemia, unspecified: Secondary | ICD-10-CM | POA: Diagnosis not present

## 2019-11-02 DIAGNOSIS — R69 Illness, unspecified: Secondary | ICD-10-CM | POA: Diagnosis not present

## 2019-11-09 DIAGNOSIS — R300 Dysuria: Secondary | ICD-10-CM | POA: Diagnosis not present

## 2019-11-09 DIAGNOSIS — R3989 Other symptoms and signs involving the genitourinary system: Secondary | ICD-10-CM | POA: Diagnosis not present

## 2019-11-09 DIAGNOSIS — R509 Fever, unspecified: Secondary | ICD-10-CM | POA: Diagnosis not present

## 2019-11-09 DIAGNOSIS — R519 Headache, unspecified: Secondary | ICD-10-CM | POA: Diagnosis not present

## 2019-11-13 DIAGNOSIS — I10 Essential (primary) hypertension: Secondary | ICD-10-CM | POA: Diagnosis not present

## 2019-11-13 DIAGNOSIS — E7800 Pure hypercholesterolemia, unspecified: Secondary | ICD-10-CM | POA: Diagnosis not present

## 2019-11-13 DIAGNOSIS — I719 Aortic aneurysm of unspecified site, without rupture: Secondary | ICD-10-CM | POA: Diagnosis not present

## 2019-11-13 DIAGNOSIS — Z952 Presence of prosthetic heart valve: Secondary | ICD-10-CM | POA: Diagnosis not present

## 2019-11-13 DIAGNOSIS — E1169 Type 2 diabetes mellitus with other specified complication: Secondary | ICD-10-CM | POA: Diagnosis not present

## 2019-11-13 DIAGNOSIS — Z125 Encounter for screening for malignant neoplasm of prostate: Secondary | ICD-10-CM | POA: Diagnosis not present

## 2019-11-13 DIAGNOSIS — Z0000 Encounter for general adult medical examination without abnormal findings: Secondary | ICD-10-CM | POA: Diagnosis not present

## 2020-01-22 DIAGNOSIS — Z952 Presence of prosthetic heart valve: Secondary | ICD-10-CM | POA: Diagnosis not present

## 2020-01-22 DIAGNOSIS — Z125 Encounter for screening for malignant neoplasm of prostate: Secondary | ICD-10-CM | POA: Diagnosis not present

## 2020-01-22 DIAGNOSIS — I10 Essential (primary) hypertension: Secondary | ICD-10-CM | POA: Diagnosis not present

## 2020-01-22 DIAGNOSIS — Z0000 Encounter for general adult medical examination without abnormal findings: Secondary | ICD-10-CM | POA: Diagnosis not present

## 2020-01-22 DIAGNOSIS — E1169 Type 2 diabetes mellitus with other specified complication: Secondary | ICD-10-CM | POA: Diagnosis not present

## 2020-01-22 DIAGNOSIS — E7800 Pure hypercholesterolemia, unspecified: Secondary | ICD-10-CM | POA: Diagnosis not present

## 2020-01-22 DIAGNOSIS — I719 Aortic aneurysm of unspecified site, without rupture: Secondary | ICD-10-CM | POA: Diagnosis not present

## 2020-03-05 DIAGNOSIS — R972 Elevated prostate specific antigen [PSA]: Secondary | ICD-10-CM | POA: Diagnosis not present

## 2020-04-23 DIAGNOSIS — I10 Essential (primary) hypertension: Secondary | ICD-10-CM | POA: Diagnosis not present

## 2020-04-23 DIAGNOSIS — E7800 Pure hypercholesterolemia, unspecified: Secondary | ICD-10-CM | POA: Diagnosis not present

## 2020-04-23 DIAGNOSIS — E1169 Type 2 diabetes mellitus with other specified complication: Secondary | ICD-10-CM | POA: Diagnosis not present

## 2020-05-20 NOTE — Progress Notes (Signed)
CARDIOLOGY OFFICE NOTE  Date:  05/29/2020    Stephen Mullins Date of Birth: 07-Feb-1946 Medical Record #[id number]  PCP:  [name], MD  Cardiologist:  Mliss Fritz  Chief Complaint  Patient presents with  . Follow-up    Seen for Dr. Anne Fu    History of Present Illness: Stephen Mullins is a 74 y.o. male who presents today for a 9 month check. Seen for Dr. Anne Fu.   He has a history of AVR per [facility] back in 12/2017 with #25 mm bioprosthetic valve.Did well post op. LV function is normal. Did have transient PAF - was on amiodarone and Eliqus - now off. Does have dilated aorta noted which is followed by [facility].   Last seen in June of 2019 by Dr. Anne Fu. I saw him this past December - he was doing well. BP good at home - weight down 10 pounds.   Comes in today. Here alone. He is doing ok. Wife had a stroke back in April - she was there for 45 days. She has improved considerably. Back home with him now. He has had to do more. Feels good. They are both walking 1 to 2 miles a day. No chest pain. Breathing is good. Not dizzy. BP looks good. Weight is stable.   Past Medical History:  Diagnosis Date  . 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 Borderline hyperlipidemia   . Diabetes mellitus without complication (HCC)   . Elevated fasting glucose   . Elevated PSA    due to prostatitis  . GERD (gastroesophageal reflux disease)   . Heart murmur   . Hypertension   . Nail fungus   . Thoracic ascending aortic aneurysm [facility])     Past Surgical History:  Procedure Laterality Date  . AORTIC VALVE REPLACEMENT N/A 12/20/2017   Procedure: AORTIC VALVE REPLACEMENT (AVR);  Surgeon: [name], [name], MD;  Location: [facility] OR;  Service: Open Heart Surgery;  Laterality: N/A;  . CATARACT EXTRACTION, BILATERAL    . RIGHT/LEFT HEART CATH AND CORONARY ANGIOGRAPHY N/A 11/09/2017   Procedure: RIGHT/LEFT HEART CATH AND  CORONARY ANGIOGRAPHY;  Surgeon: [name], MD;  Location: [facility] INVASIVE CV LAB;  Service: Cardiovascular;  Laterality: N/A;  . TEE WITHOUT CARDIOVERSION N/A 12/20/2017   Procedure: TRANSESOPHAGEAL ECHOCARDIOGRAM (TEE);  Surgeon: [name], [name], MD;  Location: [facility] OR;  Service: Open Heart Surgery;  Laterality: N/A;     Medications: Current Meds  Medication Sig  . acetaminophen (TYLENOL) 500 MG tablet Take 2 tablets (1,000 mg total) by mouth every 6 (six) hours as needed.  [name] amoxicillin (AMOXIL) 500 MG capsule TAKE 4 TABLETS BY MOUTH 1 HOUR BEFORE DENTAL PROCEDURE  . aspirin 81 MG tablet Take 81 mg by mouth daily.  [name] atorvastatin (LIPITOR) 10 MG tablet Take 10 mg by mouth daily.  . hydrochlorothiazide (HYDRODIURIL) 12.5 MG tablet Take 1 tablet (12.5 mg total) by mouth daily.  . hydrocortisone cream 1 % Apply 1 application topically daily as needed for itching.  [name] lisinopril (ZESTRIL) 5 MG tablet Take 2.5 mg by mouth daily.  . metoprolol succinate (TOPROL-XL) 25 MG 24 hr tablet Take 25 mg by mouth daily.  [name] neomycin-bacitracin-polymyxin (NEOSPORIN) ointment Apply 1 application topically daily as needed for wound care.  . NONFORMULARY OR COMPOUNDED ITEM [facility]: Antifungal nail lacquer - Fluconazole 2%, Terbinafine 1%, DMSO.  [name] oxymetazoline (AFRIN) 0.05 % nasal  spray Place 1 spray into both nostrils daily as needed for congestion.  [name] SHINGRIX injection      Allergies: Allergies  Allergen Reactions  . Tetanus Toxoid, Adsorbed Other (See Comments)    Pt. States as small child he had tetnus made from horse serum and he turned 'blue' ?? SHORTNESS OF BREATH ??    Social History: The patient  reports that he quit smoking about 18 years ago. He has never used smokeless tobacco. He reports current alcohol use of about 1.0 - 2.0 standard drink of alcohol per week. He reports that he does not use drugs.   Family History: The patient's family history includes Prostate  cancer in his father.   Review of Systems: Please see the history of present illness.   All other systems are reviewed and negative.   Physical Exam: VS:  BP 130/80   Pulse 60   Ht 5\' 11"  (1.803 m)   Wt 194 lb 12.8 oz (88.4 kg)   SpO2 97%   BMI 27.17 kg/m  .  BMI Body mass index is 27.17 kg/m.  Wt Readings from Last 3 Encounters:  05/29/20 194 lb 12.8 oz (88.4 kg)  08/15/19 194 lb 1.9 oz (88.1 kg)  05/03/19 193 lb (87.5 kg)    General: Pleasant. Alert and in no acute distress.   Cardiac: Regular rate and rhythm. Soft outflow murmur. No edema.  Respiratory:  Lungs are clear to auscultation bilaterally with normal work of breathing.  GI: Soft and nontender.  MS: No deformity or atrophy. Gait and ROM intact.  Skin: Warm and dry. Color is normal.  Neuro:  Strength and sensation are intact and no gross focal deficits noted.  Psych: Alert, appropriate and with normal affect.   LABORATORY DATA:  EKG:  EKG is not ordered today.    Lab Results  Component Value Date   WBC 8.3 01/10/2018   HGB 14.5 01/10/2018   HCT 43.9 01/10/2018   PLT 356 01/10/2018   GLUCOSE 96 01/10/2018   ALT 24 01/10/2018   AST 21 01/10/2018   NA 141 01/10/2018   K 4.7 01/10/2018   CL 98 01/10/2018   CREATININE 1.06 01/10/2018   BUN 15 01/10/2018   CO2 26 01/10/2018   TSH 5.220 (H) 01/10/2018   INR 1.49 12/20/2017   HGBA1C 5.8 (H) 12/16/2017       BNP (last 3 results) No results for input(s): BNP in the last 8760 hours.  ProBNP (last 3 results) No results for input(s): PROBNP in the last 8760 hours.   Other Studies Reviewed Today:  CTA CHEST 01/2019 IMPRESSION: 1. Interval median sternotomy and aortic valve repair without evidence of complication. 2. Stable uncomplicated mild fusiform aneurysmal dilatation of the ascending thoracic aorta measuring 44 mm in diameter, similar to the 2010 examination. Aortic aneurysm NOS (ICD10-I71.9). 3. Unchanged fusiform ectasia of the proximal  descending thoracic aorta measuring 38 mm in diameter, also similar to the 2010 examination. 4. Coronary calcifications. Aortic Atherosclerosis (ICD10-I70.0).  Electronically Signed By: 2011 M.D. On: 01/25/2019 12:18  EchoStudy Conclusions5/2019  - Left ventricle: The cavity size was normal. Wall thickness was increased in a pattern of mild LVH. Systolic function was normal. The estimated ejection fraction was in the range of 60% to 65%. Doppler parameters are consistent with abnormal left ventricular relaxation (grade 1 diastolic dysfunction). - Aortic valve: A bioprosthesis was present.  Impressions:  - Normally functioning bioprosthetic AV  Procedure (s): Aortic valve replacement with a 25-mm 01/27/2019  pericardial tissue valve - Inspiris, serial [id number] by Dr. Donata ClayVan Trigt 12/20/2017.   TEEStudy Conclusions4/15/2019  - Left ventricle: The cavity size was normal. Wall thickness was normal. Systolic function was normal. The estimated ejection fraction was in the range of 60% to 65%. Wall motion was normal; there were no regional wall motion abnormalities. - Aortic valve: Normal-sized, noncalcified annulus. Bicuspid; moderately thickened leaflets; fusion of the right-left coronary commissure. Cusp separation was severely reduced. Right coronary and left coronary cusp immobility was noted. Transvalvular velocity was increased, 448 cm/s, due to severe stenosis. Peak gradient 80 mmHg, mean gradient 46 mmHg. AVA (VTI) 1.14 cm2. There was moderate to severe regurgitation directed towards the mitral anterior leaflet. - Mitral valve: Normal-sized, noncalcified annulus. Normal thickness leaflets . Mobility of the posterior leaflet was very mildly restricted. Transvalvular velocity was within the normal range. There was no evidence for stenosis. There was mild regurgitation directed centrally. - Staged echo: Limited Post  CPB exam: Good LV function post bypass. EF 50-55%, with no wall motion abnormalities despite pacing. Prosthetic aortic valve is well-seated in the Aortic annulus. No AI seen in LV outflow tract. Post aortic valve replacement images demonstrate no residual valvular insufficiency or perivalvular leak. The mean gradient acorss the new aoric valve is 9 mmHg, with a peak of 17 mmHg. No change post bypass in mitral valve function, mild MR remains.  Impressions:  - Post aortic valve replacement surgery, the prosthetic aortic valve appears to be functioning normally. Excellent prosthetic aortic valve function without evidence for perivalvular leak. Other valves unchanged. No other significant changes from pre-bypass images   Cardiac CathConclusion3/2019    Prox RCA lesion is 30% stenosed.  Mid RCA lesion is 30% stenosed.  RPDA lesion is 30% stenosed.  Ost Cx to Mid Cx lesion is 20% stenosed.  Ost 1st Mrg lesion is 20% stenosed.  Mid LAD lesion is 20% stenosed.  Ost LM lesion is 20% stenosed.  1. Mild non-obstructive CAD 2. Severe aortic stenosis (mean gradient 35.6 mmHg, peak to peak gradient 36 mmHg, AVA 0.95 cm2).  Recommendations: Continue workup for AVR.   PreCabg Doppler 12/2017 Final Interpretation: Right Carotid: Velocities in the right ICA are consistent with a 1-39% stenosis.  Left Carotid: Velocities in the left ICA are consistent with a 1-39% stenosis. Vertebrals: Bilateral vertebral arteries demonstrate antegrade flow.  Electronically signed by Leonides SakeBrian Chen on 12/16/2017 at 4:28:08 PM.  CTA CHESTIMPRESSION4/2019: Heavily calcified aortic valve with associated mild aneurysmal dilatation of the ascending thoracic aorta measuring 4.4 cm in greatest diameter.  Aortic aneurysm NOS (ICD10-I71.9).   Electronically Signed By: Irish LackGlenn Yamagata M.D. On: 12/16/2017 16:39    Assessment & Plan:  1. History of severe  AS - prior AVR from 2019 - doing well - will get her echo updated.   2. Post op AF - in sinus by exam.   3. Resting bradycardia - HR today is 60 - he is not symptomatic - no changes made and would continue with current therapy.   4. HTN - BP is ok on current regimen - would continue  5. Thoracic aneurysm - has follow up with [facility] next month. Has good BP control and on beta blocker therapy.   6. HLD - has not wished to be on statin.   Current medicines are reviewed with the patient today.  The patient does not have concerns regarding medicines other than what has been noted above.  The following changes have been made:  See above.  Labs/ tests  ordered today include:   No orders of the defined types were placed in this encounter.    Disposition:   FU with Dr. Anne Fu in 6 months. He has follow up at [facility] with scans next month. Will get echo updated.    Patient is agreeable to this plan and will call if any problems develop in the interim.   SignedNorma Fredrickson, NP  05/29/2020 10:08 AM  [facility] [address] [place], [place]  [address] Phone: [phone] Fax: [phone]

## 2020-05-27 ENCOUNTER — Other Ambulatory Visit: Payer: Self-pay | Admitting: *Deleted

## 2020-05-27 DIAGNOSIS — I712 Thoracic aortic aneurysm, without rupture, unspecified: Secondary | ICD-10-CM

## 2020-05-29 ENCOUNTER — Ambulatory Visit: Payer: Medicare HMO | Admitting: Nurse Practitioner

## 2020-05-29 ENCOUNTER — Encounter: Payer: Self-pay | Admitting: Nurse Practitioner

## 2020-05-29 ENCOUNTER — Other Ambulatory Visit: Payer: Self-pay

## 2020-05-29 VITALS — BP 130/80 | HR 60 | Ht 71.0 in | Wt 194.8 lb

## 2020-05-29 DIAGNOSIS — I480 Paroxysmal atrial fibrillation: Secondary | ICD-10-CM

## 2020-05-29 DIAGNOSIS — Z953 Presence of xenogenic heart valve: Secondary | ICD-10-CM

## 2020-05-29 DIAGNOSIS — I10 Essential (primary) hypertension: Secondary | ICD-10-CM | POA: Diagnosis not present

## 2020-05-29 DIAGNOSIS — Z952 Presence of prosthetic heart valve: Secondary | ICD-10-CM | POA: Diagnosis not present

## 2020-05-29 DIAGNOSIS — I712 Thoracic aortic aneurysm, without rupture, unspecified: Secondary | ICD-10-CM

## 2020-05-29 NOTE — Patient Instructions (Addendum)
After Visit Summary:  We will be checking the following labs today - NONE   Medication Instructions:    Continue with your current medicines.    If you need a refill on your cardiac medications before your next appointment, please call your pharmacy.     Testing/Procedures To Be Arranged:  Echocardiogram  Follow-Up:   See Dr. Anne Fu in 6 months  See Dr. [name] as planned next month    At [facility], you and your health needs are our priority.  As part of our continuing mission to provide you with exceptional heart care, we have created designated Provider Care Teams.  These Care Teams include your primary Cardiologist (physician) and Advanced Practice Providers (APPs -  Physician Assistants and Nurse Practitioners) who all work together to provide you with the care you need, when you need it.  Special Instructions:  . Stay safe, wash your hands for at least 20 seconds and wear a mask when needed.  . It was good to talk with you today.    Call the [facility] office at [phone] if you have any questions, problems or concerns.

## 2020-06-04 DIAGNOSIS — E7800 Pure hypercholesterolemia, unspecified: Secondary | ICD-10-CM | POA: Diagnosis not present

## 2020-06-04 DIAGNOSIS — E1169 Type 2 diabetes mellitus with other specified complication: Secondary | ICD-10-CM | POA: Diagnosis not present

## 2020-06-04 DIAGNOSIS — I10 Essential (primary) hypertension: Secondary | ICD-10-CM | POA: Diagnosis not present

## 2020-06-26 ENCOUNTER — Ambulatory Visit
Admission: RE | Admit: 2020-06-26 | Discharge: 2020-06-26 | Disposition: A | Discharge status: Home / Self Care | Payer: Medicare HMO | Source: Ambulatory Visit | Attending: Cardiothoracic Surgery | Admitting: Cardiothoracic Surgery

## 2020-06-26 ENCOUNTER — Encounter: Payer: Medicare HMO | Admitting: Cardiothoracic Surgery

## 2020-06-26 DIAGNOSIS — I712 Thoracic aortic aneurysm, without rupture, unspecified: Secondary | ICD-10-CM

## 2020-06-26 DIAGNOSIS — I700 Atherosclerosis of aorta: Secondary | ICD-10-CM | POA: Diagnosis not present

## 2020-06-26 DIAGNOSIS — I253 Aneurysm of heart: Secondary | ICD-10-CM | POA: Diagnosis not present

## 2020-06-26 DIAGNOSIS — I728 Aneurysm of other specified arteries: Secondary | ICD-10-CM | POA: Diagnosis not present

## 2020-06-26 IMAGING — CT CT ANGIO CHEST
2 of 6 series · 13 of 36 positions shown · IV contrast (iopamidol)
Comparison: [DATE], [DATE]

CLINICAL DATA: Thoracic aortic aneurysm, follow-up examination

EXAM:
CT ANGIOGRAPHY CHEST WITH CONTRAST
TECHNIQUE: Multidetector CT imaging of the chest was performed using the
standard protocol during bolus administration of intravenous
contrast. Multiplanar CT image reconstructions and MIPs were
obtained to evaluate the vascular anatomy.
CONTRAST:  75mL [GY] IOPAMIDOL ([GY]) INJECTION 76%

[Series 5: cta thorax 2.00 bv36 s3 axial arterial · axial · arterial · 0.73mm/px · z∈[+1473,+1775]mm · 12 of 179 slices shown]
[im 14/179  lung]
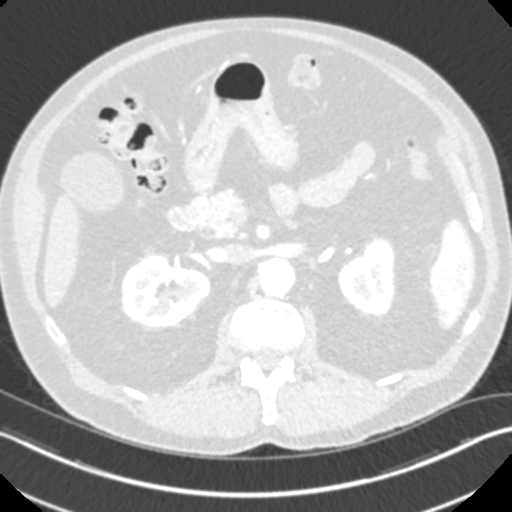
[im 28/179  mediastinal]
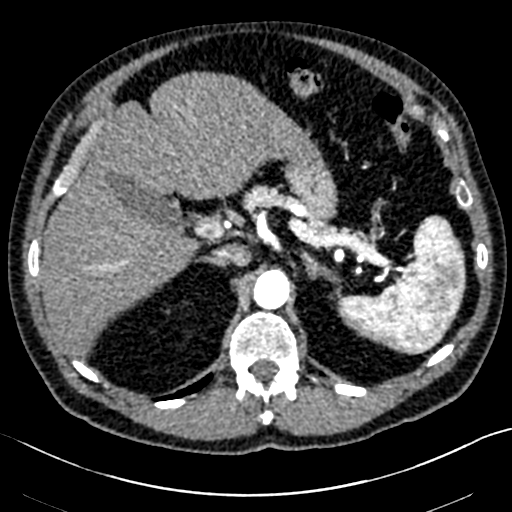
[im 42/179  lung]
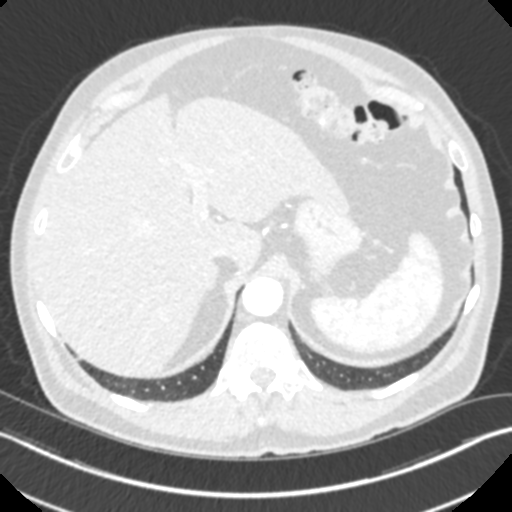
[im 55/179  mediastinal]
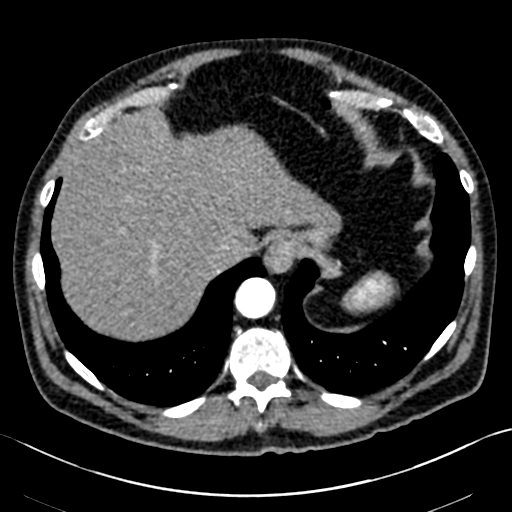
[im 69/179  lung]
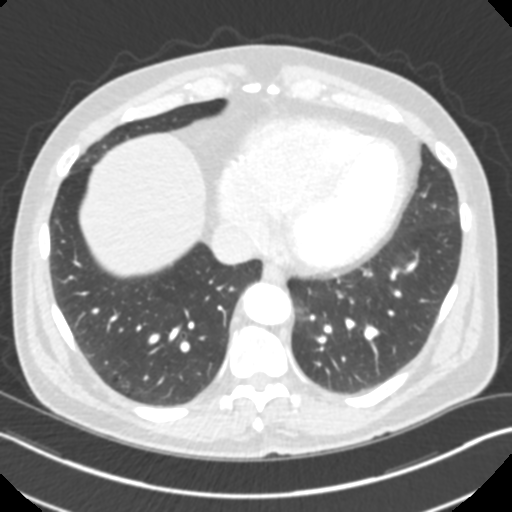
[im 83/179  mediastinal]
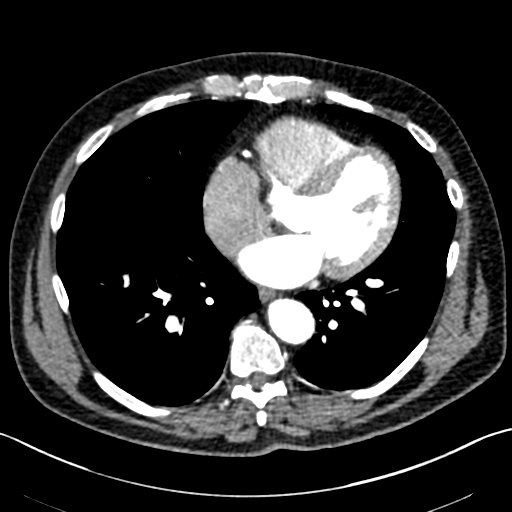
[im 96/179  lung]
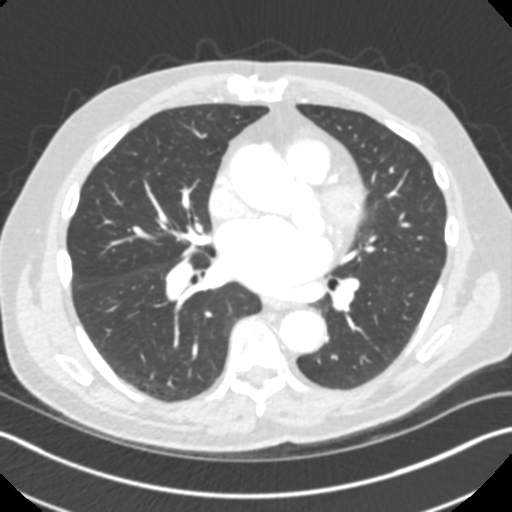
[im 110/179  mediastinal]
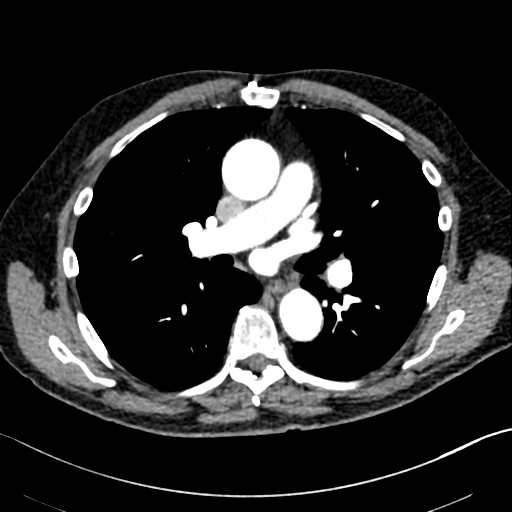
[im 124/179  lung]
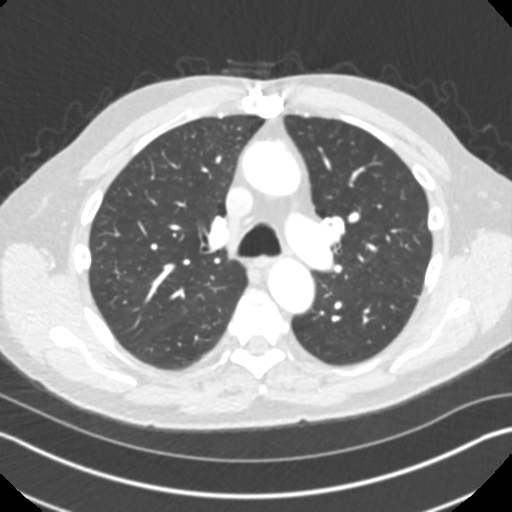
[im 137/179  mediastinal]
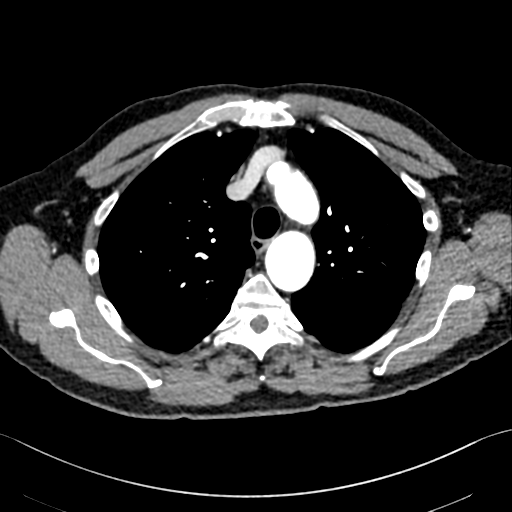
[im 151/179  lung]
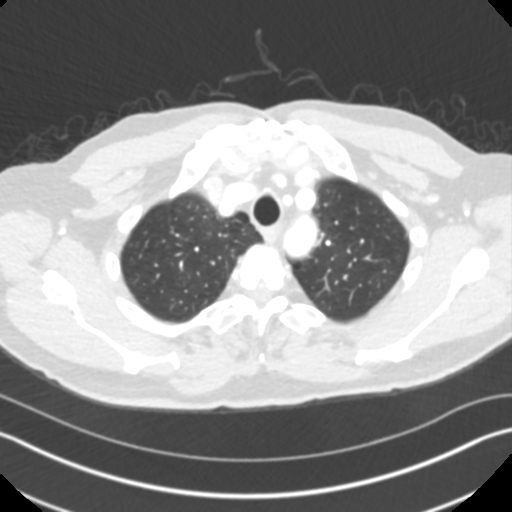
[im 165/179  mediastinal]
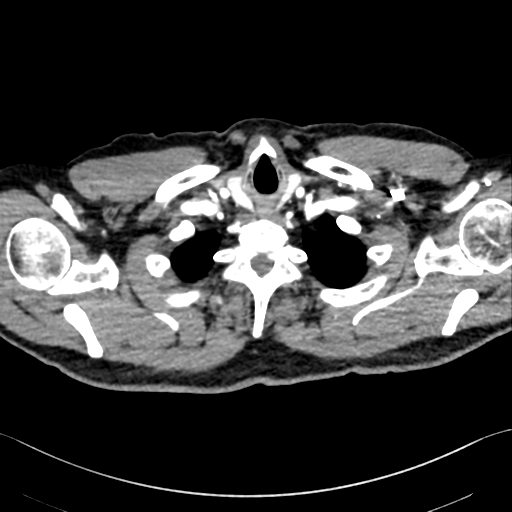

[Series 10: cta thorax 2.00 bv36 s3 cor st · coronal · 0.70mm/px · 1 of 193 slices shown]
[im 97/193  mediastinal]
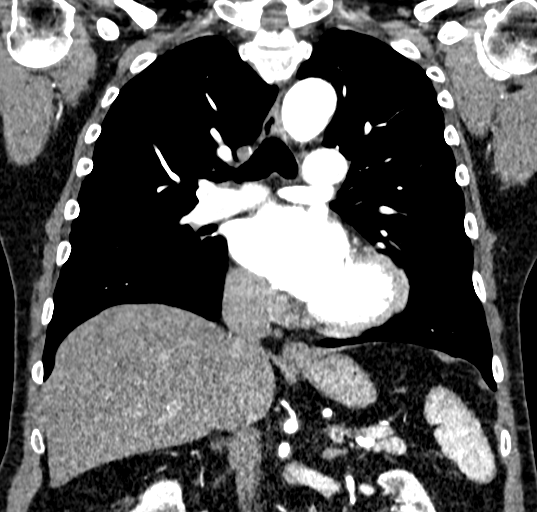

[13 of 36 positions shown; findings below may reference images not displayed]

FINDINGS: Cardiovascular:

Evaluation of the a ascending aorta is slightly limited by pulsation
artifact.

Aortic Root:

--Valve: Artificial valve, 32 mm x 32 mm

--Sinuses: 3.4 x 3.9 cm. There is a stable aneurysm of the sinus of
Valsalva arising from the left coronary cusp l demonstrating a
bilobed configuration and extending into the superior left
atrioventricular groove. This measures 20 mm x 40 mm at axial image
# 84/5

--Sinotubular Junction: 3.1 x 2.8 cm

Limitations by motion: Mild

Thoracic Aorta:

--Ascending Aorta: 4.0 x 4.0 cm

--Aortic Arch: 3.0 x 3.2 cm

--Descending Aorta: 2.9 x 3.0 cm

Other:

Mild coronary artery calcification. Aortic valve replacement has
been performed. Cardiac size within normal limits. No pericardial
effusion. Central pulmonary arteries are of normal caliber. Arch
vasculature demonstrates normal anatomic configuration and is widely
patent proximally. Minimal atherosclerotic calcification within the
aortic arch.

Mediastinum/Nodes: Stable tiny nodules within the right thyroid
lobe. These do not need follow-up given their size and the patient's
age. No pathologic mediastinal adenopathy. Esophagus unremarkable.

Lungs/Pleura: Minimal biapical paraseptal emphysema. Lungs are
clear. No pneumothorax or pleural effusion. Central airways are
unremarkable.

Upper Abdomen: No acute abnormality.

Musculoskeletal: No lytic or blastic bone lesion. No acute bone
abnormality.

Review of the MIP images confirms the above findings.
IMPRESSION: Stable sinus of Valsalva aneurysm of the left coronary cusp. This
does not appear to impinge upon the left coronary artery.

Stable thoracic aortic aneurysm with maximal diameter of 4.0 x
cm.

Aortic Atherosclerosis ([GY]-[GY]) and Emphysema ([GY]-[GY]).

## 2020-06-26 MED ORDER — IOPAMIDOL (ISOVUE-370) INJECTION 76%
75.0000 mL | Freq: Once | INTRAVENOUS | Status: AC | PRN
  Administered 2020-06-26: 75 mL via INTRAVENOUS

## 2020-07-03 ENCOUNTER — Encounter: Payer: Self-pay | Admitting: Cardiothoracic Surgery

## 2020-07-03 ENCOUNTER — Other Ambulatory Visit: Payer: Self-pay

## 2020-07-03 ENCOUNTER — Ambulatory Visit: Payer: Medicare HMO | Admitting: Cardiothoracic Surgery

## 2020-07-03 VITALS — BP 129/78 | HR 59 | Resp 20 | Ht 71.0 in | Wt 197.6 lb

## 2020-07-03 DIAGNOSIS — Z953 Presence of xenogenic heart valve: Secondary | ICD-10-CM

## 2020-07-03 DIAGNOSIS — Z09 Encounter for follow-up examination after completed treatment for conditions other than malignant neoplasm: Secondary | ICD-10-CM | POA: Diagnosis not present

## 2020-07-03 DIAGNOSIS — Z952 Presence of prosthetic heart valve: Secondary | ICD-10-CM

## 2020-07-03 MED ORDER — AMOXICILLIN 500 MG PO CAPS: ORAL_CAPSULE | ORAL | 1 refills | Status: DC

## 2020-07-03 NOTE — Progress Notes (Signed)
PCP is [name], MD Referring Provider is Jake Bathe, MD  Chief Complaint  Patient presents with  . Thoracic Aortic Aneurysm    f/u with chest CTA, s/p AVR 12/20/17    HPI: The patient returns for scheduled follow-up of a 4.4 cm asymptomatic fusiform ascending aneurysm followed for several years.  In 2019 the patient had replacement of a severely stenotic bicuspid aortic valve with a 25 mm Inspiris Edwards pericardial valve.  At that time the ascending aorta did not appear abnormal.  He has been doing well without cardiac symptoms.  The patient has hypertension which is controlled with Toprol and lisinopril.  He is followed by Dr. Anne Fu and will have a echocardiogram next month.  His previous post op echo showed a mean gradient of 11 mmHg.  CTA images performed today personally viewed and discussed with patient.  He remains to have a slight dilatation of the ascending aorta at 4.4 cm. On the report today there is noted a small 18 mm dilitation of the left sinus of Valsalva at the aortic root not previously reported on earlier CT scans or echocardiograms.  Looking back at previous CT scans this appears stable in size.  The aneurysmal  dilatation of the sinus of Valsalva shows no involvement with the left coronary. Past Medical History:  Diagnosis Date  . 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 Borderline hyperlipidemia   . Diabetes mellitus without complication (HCC)   . Elevated fasting glucose   . Elevated PSA    due to prostatitis  . GERD (gastroesophageal reflux disease)   . Heart murmur   . Hypertension   . Nail fungus   . Thoracic ascending aortic aneurysm [facility])     Past Surgical History:  Procedure Laterality Date  . AORTIC VALVE REPLACEMENT N/A 12/20/2017   Procedure: AORTIC VALVE REPLACEMENT (AVR);  Surgeon: [name], [name], MD;  Location: [facility] OR;  Service: Open Heart Surgery;  Laterality:  N/A;  . CATARACT EXTRACTION, BILATERAL    . RIGHT/LEFT HEART CATH AND CORONARY ANGIOGRAPHY N/A 11/09/2017   Procedure: RIGHT/LEFT HEART CATH AND CORONARY ANGIOGRAPHY;  Surgeon: [name], MD;  Location: [facility] INVASIVE CV LAB;  Service: Cardiovascular;  Laterality: N/A;  . TEE WITHOUT CARDIOVERSION N/A 12/20/2017   Procedure: TRANSESOPHAGEAL ECHOCARDIOGRAM (TEE);  Surgeon: [name], [name], MD;  Location: [facility] OR;  Service: Open Heart Surgery;  Laterality: N/A;    Family History  Problem Relation Age of Onset  . Prostate cancer Father     Social History Social History   Tobacco Use  . Smoking status: Former Smoker    Quit date: 10/20/2001    Years since quitting: 18.7  . Smokeless tobacco: Never Used  Vaping Use  . Vaping Use: Never used  Substance Use Topics  . Alcohol use: Yes    Alcohol/week: 1.0 - 2.0 standard drink    Types: 1 - 2 Glasses of wine per week  . Drug use: No    Current Outpatient Medications  Medication Sig Dispense Refill  . acetaminophen (TYLENOL) 500 MG tablet Take 2 tablets (1,000 mg total) by mouth every 6 (six) hours as needed. 30 tablet 0  . amoxicillin (AMOXIL) 500 MG capsule TAKE 4 TABLETS BY MOUTH 1 HOUR BEFORE DENTAL PROCEDURE 4 capsule 1  . aspirin 81 MG tablet Take 81 mg by mouth daily.    [name] atorvastatin (LIPITOR) 10 MG tablet Take 10  mg by mouth daily.    . hydrochlorothiazide (HYDRODIURIL) 12.5 MG tablet Take 1 tablet (12.5 mg total) by mouth daily. 90 tablet 3  . hydrocortisone cream 1 % Apply 1 application topically daily as needed for itching.    [name] lisinopril (ZESTRIL) 5 MG tablet Take 2.5 mg by mouth daily.    . metoprolol succinate (TOPROL-XL) 25 MG 24 hr tablet Take 25 mg by mouth daily.    [name] neomycin-bacitracin-polymyxin (NEOSPORIN) ointment Apply 1 application topically daily as needed for wound care.    . NONFORMULARY OR COMPOUNDED ITEM [facility]: Antifungal nail lacquer - Fluconazole 2%, Terbinafine 1%, DMSO. 120 each 3   . oxymetazoline (AFRIN) 0.05 % nasal spray Place 1 spray into both nostrils daily as needed for congestion.    [name] SHINGRIX injection      No current facility-administered medications for this visit.    Allergies  Allergen Reactions  . Tetanus Toxoid, Adsorbed Other (See Comments)    Pt. States as small child he had tetnus made from horse serum and he turned 'blue' ?? SHORTNESS OF BREATH ??    Review of Systems  Patient remains active and walks 1 mile 4 days a week No chest pain or shortness of breath Slight weight gain over the past year No orthopnea PND or ankle edema No palpitations Compliant with blood pressure medications  BP 129/78 (BP Location: Left Arm, Patient Position: Sitting)   Pulse (!) 59   Resp 20   Ht 5\' 11"  (1.803 m)   Wt 197 lb 9.6 oz (89.6 kg)   SpO2 95% Comment: RA  BMI 27.56 kg/m  Physical Exam      Exam    General- alert and comfortable    Neck- no JVD, no cervical adenopathy palpable, no carotid bruit   Lungs- clear without rales, wheezes   Cor- regular rate and rhythm, no murmur , gallop   Abdomen- soft, non-tender   Extremities - warm, non-tender, minimal edema   Neuro- oriented, appropriate, no focal weakness   Diagnostic Tests: CTA of thoracic aorta personally viewed with results as noted above.  No change in mild fusiform dilatation measuring 4.4 cm in diameter  Impression: Patient doing well after aortic valve replacement. His thoracic aortic disease should remain stable as long as he control his blood pressure and he understands that.  We also discussed the importance of dental antibiotic prophylaxis and he will plan on taking amoxicillin before any dental procedures. Plan: He will return in 18 months for repeat CTA of the thoracic aorta.  Because of my retirement he will see one of the other CT surgeons in our practice and he understands that plan.  , MD [facility] [phone]

## 2020-07-04 DIAGNOSIS — R69 Illness, unspecified: Secondary | ICD-10-CM | POA: Diagnosis not present

## 2020-07-24 ENCOUNTER — Ambulatory Visit (HOSPITAL_COMMUNITY): Payer: Medicare HMO | Attending: Internal Medicine

## 2020-07-24 ENCOUNTER — Other Ambulatory Visit: Payer: Self-pay

## 2020-07-24 DIAGNOSIS — Z952 Presence of prosthetic heart valve: Secondary | ICD-10-CM | POA: Insufficient documentation

## 2020-07-24 LAB — ECHOCARDIOGRAM COMPLETE
AR max vel: 1.92 cm2
AV Area VTI: 2.19 cm2
AV Area mean vel: 2.1 cm2
AV Mean grad: 10 mmHg
AV Peak grad: 22.7 mmHg
Ao pk vel: 2.38 m/s
Area-P 1/2: 1.55 cm2
S' Lateral: 3.3 cm

## 2020-08-06 DIAGNOSIS — R69 Illness, unspecified: Secondary | ICD-10-CM | POA: Diagnosis not present

## 2020-08-08 DIAGNOSIS — R69 Illness, unspecified: Secondary | ICD-10-CM | POA: Diagnosis not present

## 2020-08-20 DIAGNOSIS — E7800 Pure hypercholesterolemia, unspecified: Secondary | ICD-10-CM | POA: Diagnosis not present

## 2020-08-20 DIAGNOSIS — E1169 Type 2 diabetes mellitus with other specified complication: Secondary | ICD-10-CM | POA: Diagnosis not present

## 2020-08-20 DIAGNOSIS — I10 Essential (primary) hypertension: Secondary | ICD-10-CM | POA: Diagnosis not present

## 2020-08-27 ENCOUNTER — Telehealth: Payer: Self-pay | Admitting: *Deleted

## 2020-08-27 NOTE — Telephone Encounter (Signed)
Called and spoke with the patient and patient does have an appointment on 09/03/2020 with Dr Ardelle Anton and I stated that we were going to discontinue the medicine from [facility] as of today since patient is not getting any better. Misty Stanley

## 2020-09-03 ENCOUNTER — Other Ambulatory Visit: Payer: Self-pay | Admitting: Podiatry

## 2020-09-03 ENCOUNTER — Ambulatory Visit: Payer: Medicare HMO | Admitting: Podiatry

## 2020-09-03 ENCOUNTER — Other Ambulatory Visit: Payer: Self-pay

## 2020-09-03 DIAGNOSIS — B351 Tinea unguium: Secondary | ICD-10-CM

## 2020-09-03 DIAGNOSIS — Z79899 Other long term (current) drug therapy: Secondary | ICD-10-CM | POA: Diagnosis not present

## 2020-09-04 LAB — CBC WITH DIFFERENTIAL/PLATELET
Basophils Absolute: 0.1 10*3/uL (ref 0.0–0.2)
Basos: 1 %
EOS (ABSOLUTE): 0.3 10*3/uL (ref 0.0–0.4)
Eos: 3 %
Hematocrit: 49 % (ref 37.5–51.0)
Hemoglobin: 16.6 g/dL (ref 13.0–17.7)
Immature Grans (Abs): 0 10*3/uL (ref 0.0–0.1)
Immature Granulocytes: 1 %
Lymphocytes Absolute: 1.8 10*3/uL (ref 0.7–3.1)
Lymphs: 21 %
MCH: 30.2 pg (ref 26.6–33.0)
MCHC: 33.9 g/dL (ref 31.5–35.7)
MCV: 89 fL (ref 79–97)
Monocytes Absolute: 0.7 10*3/uL (ref 0.1–0.9)
Monocytes: 9 %
Neutrophils Absolute: 5.4 10*3/uL (ref 1.4–7.0)
Neutrophils: 65 %
Platelets: 255 10*3/uL (ref 150–450)
RBC: 5.5 x10E6/uL (ref 4.14–5.80)
RDW: 12.2 % (ref 11.6–15.4)
WBC: 8.2 10*3/uL (ref 3.4–10.8)

## 2020-09-04 LAB — HEPATIC FUNCTION PANEL
ALT: 23 IU/L (ref 0–44)
AST: 19 IU/L (ref 0–40)
Albumin: 4.6 g/dL (ref 3.7–4.7)
Alkaline Phosphatase: 60 IU/L (ref 44–121)
Bilirubin Total: 0.5 mg/dL (ref 0.0–1.2)
Bilirubin, Direct: 0.13 mg/dL (ref 0.00–0.40)
Total Protein: 7.1 g/dL (ref 6.0–8.5)

## 2020-09-05 ENCOUNTER — Other Ambulatory Visit: Payer: Self-pay | Admitting: Podiatry

## 2020-09-05 MED ORDER — TERBINAFINE HCL 250 MG PO TABS: 250.0000 mg | ORAL_TABLET | Freq: Every day | ORAL | 0 refills | Status: DC

## 2020-09-06 NOTE — Progress Notes (Signed)
Subjective: 74 year old male presents the office today for concerns of continued toenail fungus.  He previously been on Lamisil and he just feels it is helpful and it looks that the nail fungus is coming back.  Denies any pain in the nails and denies any redness or drainage or any swelling.  No recent treatment.  No other concerns. Denies any systemic complaints such as fevers, chills, nausea, vomiting. No acute changes since last appointment, and no other complaints at this time.   Objective: AAO x3, NAD DP/PT pulses palpable bilaterally, CRT less than 3 seconds Toenails most of the hallux nails are hypertrophic, dystrophic with yellow-brown discoloration.  No hyperpigmented changes.  No pain, edema, erythema or any signs of infection. No pain with calf compression, swelling, warmth, erythema  Assessment: Onychomycosis  Plan: -All treatment options discussed with the patient including all alternatives, risks, complications.  -Discussed various treatment options.  As he is done well with Lamisil previously we will do another round of this medication.  Discussed side effects.  We will check a CBC and LFT prior to starting the medication.  If no improvement will switch to fluconazole. -Patient encouraged to call the office with any questions, concerns, change in symptoms.   Vivi Barrack DPM

## 2020-10-03 ENCOUNTER — Other Ambulatory Visit: Payer: Self-pay | Admitting: Nurse Practitioner

## 2020-10-14 ENCOUNTER — Telehealth: Payer: Self-pay | Admitting: Podiatry

## 2020-10-14 ENCOUNTER — Other Ambulatory Visit: Payer: Self-pay | Admitting: Podiatry

## 2020-10-14 DIAGNOSIS — Z79899 Other long term (current) drug therapy: Secondary | ICD-10-CM

## 2020-10-14 NOTE — Telephone Encounter (Signed)
Order has been placed.

## 2020-10-14 NOTE — Telephone Encounter (Signed)
Thank you :)

## 2020-10-14 NOTE — Telephone Encounter (Signed)
Pt would like lab requisition to take over [facility] for his blood work. He cx appointment today. He would like to pick it up on Wednesday.

## 2020-10-15 ENCOUNTER — Ambulatory Visit: Payer: Medicare HMO | Admitting: Podiatry

## 2020-10-16 DIAGNOSIS — Z79899 Other long term (current) drug therapy: Secondary | ICD-10-CM | POA: Diagnosis not present

## 2020-10-17 LAB — HEPATIC FUNCTION PANEL
ALT: 26 IU/L (ref 0–44)
AST: 25 IU/L (ref 0–40)
Albumin: 4.3 g/dL (ref 3.7–4.7)
Alkaline Phosphatase: 56 IU/L (ref 44–121)
Bilirubin Total: 0.6 mg/dL (ref 0.0–1.2)
Bilirubin, Direct: 0.17 mg/dL (ref 0.00–0.40)
Total Protein: 6.9 g/dL (ref 6.0–8.5)

## 2020-10-17 LAB — CBC WITH DIFFERENTIAL/PLATELET
Basophils Absolute: 0.1 10*3/uL (ref 0.0–0.2)
Basos: 1 %
EOS (ABSOLUTE): 0.2 10*3/uL (ref 0.0–0.4)
Eos: 3 %
Hematocrit: 45 % (ref 37.5–51.0)
Hemoglobin: 15.5 g/dL (ref 13.0–17.7)
Immature Grans (Abs): 0 10*3/uL (ref 0.0–0.1)
Immature Granulocytes: 0 %
Lymphocytes Absolute: 1.4 10*3/uL (ref 0.7–3.1)
Lymphs: 23 %
MCH: 30.4 pg (ref 26.6–33.0)
MCHC: 34.4 g/dL (ref 31.5–35.7)
MCV: 88 fL (ref 79–97)
Monocytes Absolute: 0.6 10*3/uL (ref 0.1–0.9)
Monocytes: 10 %
Neutrophils Absolute: 4 10*3/uL (ref 1.4–7.0)
Neutrophils: 63 %
Platelets: 235 10*3/uL (ref 150–450)
RBC: 5.1 x10E6/uL (ref 4.14–5.80)
RDW: 12.2 % (ref 11.6–15.4)
WBC: 6.2 10*3/uL (ref 3.4–10.8)

## 2020-11-01 DIAGNOSIS — E7800 Pure hypercholesterolemia, unspecified: Secondary | ICD-10-CM | POA: Diagnosis not present

## 2020-11-01 DIAGNOSIS — E1169 Type 2 diabetes mellitus with other specified complication: Secondary | ICD-10-CM | POA: Diagnosis not present

## 2020-11-01 DIAGNOSIS — I10 Essential (primary) hypertension: Secondary | ICD-10-CM | POA: Diagnosis not present

## 2020-11-21 ENCOUNTER — Other Ambulatory Visit: Payer: Self-pay

## 2020-11-21 ENCOUNTER — Encounter: Payer: Self-pay | Admitting: Cardiology

## 2020-11-21 ENCOUNTER — Ambulatory Visit: Payer: Medicare HMO | Admitting: Cardiology

## 2020-11-21 VITALS — BP 150/80 | HR 52 | Ht 71.0 in | Wt 196.0 lb

## 2020-11-21 DIAGNOSIS — I480 Paroxysmal atrial fibrillation: Secondary | ICD-10-CM | POA: Diagnosis not present

## 2020-11-21 DIAGNOSIS — Z953 Presence of xenogenic heart valve: Secondary | ICD-10-CM | POA: Diagnosis not present

## 2020-11-21 DIAGNOSIS — I10 Essential (primary) hypertension: Secondary | ICD-10-CM | POA: Diagnosis not present

## 2020-11-21 DIAGNOSIS — R001 Bradycardia, unspecified: Secondary | ICD-10-CM

## 2020-11-21 MED ORDER — AMOXICILLIN 500 MG PO CAPS: ORAL_CAPSULE | ORAL | 3 refills | Status: AC

## 2020-11-21 NOTE — Patient Instructions (Addendum)
Medication Instructions:  Please discontinue your Metoprolol.  Continue all other medications as listed.  *If you need a refill on your cardiac medications before your next appointment, please call your pharmacy*  Testing: Your physician has requested that you have an exercise tolerance test. For further information please visit [email or web address] Please also follow instruction sheet, as given.  Follow-Up: At [facility], you and your health needs are our priority.  As part of our continuing mission to provide you with exceptional heart care, we have created designated Provider Care Teams.  These Care Teams include your primary Cardiologist (physician) and Advanced Practice Providers (APPs -  Physician Assistants and Nurse Practitioners) who all work together to provide you with the care you need, when you need it.  We recommend signing up for the patient portal called "MyChart".  Sign up information is provided on this After Visit Summary.  MyChart is used to connect with patients for Virtual Visits (Telemedicine).  Patients are able to view lab/test results, encounter notes, upcoming appointments, etc.  Non-urgent messages can be sent to your provider as well.   To learn more about what you can do with MyChart, go to [email or web address].    Your next appointment:   6 month(s)  The format for your next appointment:   In Person  Provider:   Donato Schultz, MD   Thank you for choosing [facility]!!

## 2020-11-21 NOTE — Progress Notes (Signed)
Cardiology Office Note:    Date:  11/21/2020   ID:  Stephen Mullins, DOB 01-05-1946, MRN [id number]  PCP:  Soundra Pilon, FNP   [facility]  Cardiologist:  Donato Schultz, MD  Advanced Practice Provider:  No care team member to display Electrophysiologist:  None       Referring MD: [name], MD     History of Present Illness:    Stephen Mullins is a 75 y.o. male here for the follow-up of aortic valve replacement in April 2019 with a number 25 mm bioprosthetic valve.  LV function normal.  Dilated aorta.  Doing well.  Current EKG today shows sinus rhythm with ventricular bigeminy heart rate 52 but effective heart rate likely in the 30s secondary to PVCs.  No dizziness no syncope.  Walking 1 to 2 miles a day.  Past Medical History:  Diagnosis Date  . 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 Borderline hyperlipidemia   . Diabetes mellitus without complication (HCC)   . Elevated fasting glucose   . Elevated PSA    due to prostatitis  . GERD (gastroesophageal reflux disease)   . Heart murmur   . Hypertension   . Nail fungus   . Thoracic ascending aortic aneurysm [facility])     Past Surgical History:  Procedure Laterality Date  . AORTIC VALVE REPLACEMENT N/A 12/20/2017   Procedure: AORTIC VALVE REPLACEMENT (AVR);  Surgeon: [name], [name], MD;  Location: [facility] OR;  Service: Open Heart Surgery;  Laterality: N/A;  . CATARACT EXTRACTION, BILATERAL    . RIGHT/LEFT HEART CATH AND CORONARY ANGIOGRAPHY N/A 11/09/2017   Procedure: RIGHT/LEFT HEART CATH AND CORONARY ANGIOGRAPHY;  Surgeon: [name], MD;  Location: [facility] INVASIVE CV LAB;  Service: Cardiovascular;  Laterality: N/A;  . TEE WITHOUT CARDIOVERSION N/A 12/20/2017   Procedure: TRANSESOPHAGEAL ECHOCARDIOGRAM (TEE);  Surgeon: [name], [name], MD;  Location: [facility] OR;  Service: Open Heart Surgery;  Laterality: N/A;    Current  Medications: Current Meds  Medication Sig  . acetaminophen (TYLENOL) 500 MG tablet Take 2 tablets (1,000 mg total) by mouth every 6 (six) hours as needed.  [name] aspirin 81 MG tablet Take 81 mg by mouth daily.  [name] atorvastatin (LIPITOR) 10 MG tablet Take 10 mg by mouth daily.  . hydrochlorothiazide (HYDRODIURIL) 12.5 MG tablet Take 1 tablet by mouth once daily  . hydrocortisone cream 1 % Apply 1 application topically daily as needed for itching.  [name] lisinopril (ZESTRIL) 5 MG tablet Take 2.5 mg by mouth daily.  [name] neomycin-bacitracin-polymyxin (NEOSPORIN) ointment Apply 1 application topically daily as needed for wound care.  . NONFORMULARY OR COMPOUNDED ITEM [facility]: Antifungal nail lacquer - Fluconazole 2%, Terbinafine 1%, DMSO.  [name] oxymetazoline (AFRIN) 0.05 % nasal spray Place 1 spray into both nostrils daily as needed for congestion.  [name] SHINGRIX injection   . terbinafine (LAMISIL) 250 MG tablet Take 1 tablet (250 mg total) by mouth daily.  . [DISCONTINUED] amoxicillin (AMOXIL) 500 MG capsule TAKE 4 TABLETS BY MOUTH 1 HOUR BEFORE DENTAL PROCEDURE  . [DISCONTINUED] metoprolol succinate (TOPROL-XL) 25 MG 24 hr tablet Take 25 mg by mouth daily.     Allergies:   Tetanus toxoid, adsorbed   Social History   Socioeconomic History  . Marital status: Married    Spouse name: Not on file  . Number of children: Not on file  . Years of  education: Not on file  . Highest education level: Not on file  Occupational History  . Occupation: retired  Tobacco Use  . Smoking status: Former Smoker    Quit date: 10/20/2001    Years since quitting: 19.1  . Smokeless tobacco: Never Used  Vaping Use  . Vaping Use: Never used  Substance and Sexual Activity  . Alcohol use: Yes    Alcohol/week: 1.0 - 2.0 standard drink    Types: 1 - 2 Glasses of wine per week  . Drug use: No  . Sexual activity: Not on file  Other Topics Concern  . Not on file  Social History Narrative  . Not on file   Social  Determinants of Health   Financial Resource Strain: Not on file  Food Insecurity: Not on file  Transportation Needs: Not on file  Physical Activity: Not on file  Stress: Not on file  Social Connections: Not on file     Family History: The patient's family history includes Prostate cancer in his father.  ROS:   Please see the history of present illness.     All other systems reviewed and are negative.  EKGs/Labs/Other Studies Reviewed:    The following studies were reviewed today: CTA of the chest 06/26/2020  -4 cm, stable.  Aorta.  ECHO 07/24/20:  1. Frequent PVC's are noted. Left ventricular ejection fraction, by  estimation, is 65 to 70%. The left ventricle has normal function. The left  ventricle has no regional wall motion abnormalities. There is mild  eccentric left ventricular hypertrophy of  the basal-septal segment. Left ventricular diastolic parameters are  consistent with Grade I diastolic dysfunction (impaired relaxation).  2. The mitral valve is abnormal. Trivial mitral valve regurgitation.  3. The aortic valve has been repaired/replaced. There is a 25 mm Edwards  Inspiris bioprosthetic valve noted in the aortic position. Aortic valve  regurgitation is not visualized. Aortic valve area, by VTI measures 2.19  cm. Aortic valve mean gradient  measures 10.0 mmHg. Aortic valve Vmax measures 2.38 m/s. DI is 0.45.  4. Aortic dilatation noted. There is mild dilatation of the aortic root,  measuring 40 mm. There is mild to moderate dilatation of the ascending  aorta, measuring 44 mm.  5. Right ventricular systolic function is mildly reduced. The right  ventricular size is normal. There is normal pulmonary artery systolic  pressure.  6. The inferior vena cava is normal in size with greater than 50%  respiratory variability, suggesting right atrial pressure of 3 mmHg.   Comparison(s): Changes from prior study are noted. 01/25/2018: LVEF 60-65%, bioprosthetic AVR  - mean gradient 11 mmHg, aorta dilated to 42 mm.    PreCabg Doppler 12/2017 Final Interpretation: Right Carotid: Velocities in the right ICA are consistent with a 1-39% stenosis.  Left Carotid: Velocities in the left ICA are consistent with a 1-39% stenosis. Vertebrals: Bilateral vertebral arteries demonstrate antegrade flow.  Electronically signed by Leonides Sake on 12/16/2017 at 4:28:08 PM.  CTA CHESTIMPRESSION4/2019: Heavily calcified aortic valve with associated mild aneurysmal dilatation of the ascending thoracic aorta measuring 4.4 cm in greatest diameter.  Aortic aneurysm NOS (ICD10-I71.9).    Cardiac CathConclusion3/2019    Prox RCA lesion is 30% stenosed.  Mid RCA lesion is 30% stenosed.  RPDA lesion is 30% stenosed.  Ost Cx to Mid Cx lesion is 20% stenosed.  Ost 1st Mrg lesion is 20% stenosed.  Mid LAD lesion is 20% stenosed.  Ost LM lesion is 20% stenosed.  1. Mild  non-obstructive CAD 2. Severe aortic stenosis (mean gradient 35.6 mmHg, peak to peak gradient 36 mmHg, AVA 0.95 cm2).      EKG:  EKG is  ordered today.  The ekg ordered today demonstrates sinus rhythm 52 ventricular bigeminy frequent PVCs noted.  Recent Labs: 10/16/2020: ALT 26; Hemoglobin 15.5; Platelets 235  Recent Lipid Panel No results found for: CHOL, TRIG, HDL, CHOLHDL, VLDL, LDLCALC, LDLDIRECT   Risk Assessment/Calculations:      Physical Exam:    VS:  BP (!) 150/80 (BP Location: Left Arm, Patient Position: Sitting, Cuff Size: Normal)   Pulse (!) 52   Ht 5\' 11"  (1.803 m)   Wt 196 lb (88.9 kg)   SpO2 96%   BMI 27.34 kg/m     Wt Readings from Last 3 Encounters:  11/21/20 196 lb (88.9 kg)  07/03/20 197 lb 9.6 oz (89.6 kg)  05/29/20 194 lb 12.8 oz (88.4 kg)     GEN:  Well nourished, well developed in no acute distress HEENT: Normal NECK: No JVD; No carotid bruits LYMPHATICS: No lymphadenopathy CARDIAC: brady reg, no murmurs, rubs, gallops RESPIRATORY:  Clear  to auscultation without rales, wheezing or rhonchi  ABDOMEN: Soft, non-tender, non-distended MUSCULOSKELETAL:  No edema; No deformity  SKIN: Warm and dry NEUROLOGIC:  Alert and oriented x 3 PSYCHIATRIC:  Normal affect   ASSESSMENT:    1. Essential (primary) hypertension   2. Paroxysmal atrial fibrillation (HCC)   3. S/P aortic valve replacement with bioprosthetic valve   4. Bradycardia    PLAN:    In order of problems listed above:  Aortic valve replacement bioprosthetic in 2019 -Overall doing well with echocardiogram as above.  Postop atrial fibrillation -Currently sinus rhythm but frequent PVCs are noted.  Resting bradycardia -Previously heart rate was in the 60s however his bigeminal pattern PVCs with effectively show a significantly reduced heart rate.  No ahead and stop his metoprolol 25 mg.  This was previously reduced from 50. -After walking him around clinic today his heart rate only increased to 56 bpm.  He does recall that when he went to the gym his heart rate rarely ever got above 80.  I will go ahead and check an exercise treadmill test to demonstrate chronotropic competence.  He does state that he is able to cut his grass etc.  Thoracic aortic aneurysm -Has been followed traditionally by Dr. Maren BeachVantrigt.  Overall maintained good blood pressure control.  Usually has a degree of whitecoat hypertension.  Essential hypertension -On hydrochlorothiazide as well as lisinopril.  Low-dose.  Usually at home blood pressure under good control.  Now that we are stopping the metoprolol, may need to increase his lisinopril to full dose 5 mg.    6 month f/u   Medication Adjustments/Labs and Tests Ordered: Current medicines are reviewed at length with the patient today.  Concerns regarding medicines are outlined above.  Orders Placed This Encounter  Procedures  . EXERCISE TOLERANCE TEST (ETT)  . EKG 12-Lead   Meds ordered this encounter  Medications  . amoxicillin (AMOXIL)  500 MG capsule    Sig: TAKE 4 TABLETS BY MOUTH 1 HOUR BEFORE DENTAL PROCEDURE    Dispense:  4 capsule    Refill:  3    Patient Instructions  Medication Instructions:  Please discontinue your Metoprolol.  Continue all other medications as listed.  *If you need a refill on your cardiac medications before your next appointment, please call your pharmacy*  Testing: Your physician has requested that you  have an exercise tolerance test. For further information please visit [email or web address] Please also follow instruction sheet, as given.  Follow-Up: At [facility], you and your health needs are our priority.  As part of our continuing mission to provide you with exceptional heart care, we have created designated Provider Care Teams.  These Care Teams include your primary Cardiologist (physician) and Advanced Practice Providers (APPs -  Physician Assistants and Nurse Practitioners) who all work together to provide you with the care you need, when you need it.  We recommend signing up for the patient portal called "MyChart".  Sign up information is provided on this After Visit Summary.  MyChart is used to connect with patients for Virtual Visits (Telemedicine).  Patients are able to view lab/test results, encounter notes, upcoming appointments, etc.  Non-urgent messages can be sent to your provider as well.   To learn more about what you can do with MyChart, go to [email or web address].    Your next appointment:   6 month(s)  The format for your next appointment:   In Person  Provider:   Donato Schultz, MD   Thank you for choosing [facility]!!        Signed, Donato Schultz, MD  11/21/2020 12:09 PM    [facility]

## 2020-11-25 DIAGNOSIS — I10 Essential (primary) hypertension: Secondary | ICD-10-CM | POA: Diagnosis not present

## 2020-11-25 DIAGNOSIS — Z125 Encounter for screening for malignant neoplasm of prostate: Secondary | ICD-10-CM | POA: Diagnosis not present

## 2020-11-25 DIAGNOSIS — I480 Paroxysmal atrial fibrillation: Secondary | ICD-10-CM | POA: Diagnosis not present

## 2020-11-25 DIAGNOSIS — E7800 Pure hypercholesterolemia, unspecified: Secondary | ICD-10-CM | POA: Diagnosis not present

## 2020-11-25 DIAGNOSIS — Q231 Congenital insufficiency of aortic valve: Secondary | ICD-10-CM | POA: Diagnosis not present

## 2020-11-25 DIAGNOSIS — Z0000 Encounter for general adult medical examination without abnormal findings: Secondary | ICD-10-CM | POA: Diagnosis not present

## 2020-11-25 DIAGNOSIS — E1169 Type 2 diabetes mellitus with other specified complication: Secondary | ICD-10-CM | POA: Diagnosis not present

## 2020-11-26 ENCOUNTER — Telehealth: Payer: Self-pay | Admitting: Cardiology

## 2020-11-26 NOTE — Telephone Encounter (Signed)
Pt's appts have been rescheduled as requested.  Thanks to [facility]! GXT 12/10/2020

## 2020-11-26 NOTE — Telephone Encounter (Signed)
Patient is requesting to reschedule his ETT/COVID screening on 12/03/20. Attempted to contact check out staff for assistance , but did not get a response.

## 2020-11-28 DIAGNOSIS — I10 Essential (primary) hypertension: Secondary | ICD-10-CM | POA: Diagnosis not present

## 2020-11-28 DIAGNOSIS — E7800 Pure hypercholesterolemia, unspecified: Secondary | ICD-10-CM | POA: Diagnosis not present

## 2020-11-28 DIAGNOSIS — Z125 Encounter for screening for malignant neoplasm of prostate: Secondary | ICD-10-CM | POA: Diagnosis not present

## 2020-11-28 DIAGNOSIS — E1169 Type 2 diabetes mellitus with other specified complication: Secondary | ICD-10-CM | POA: Diagnosis not present

## 2020-11-28 DIAGNOSIS — Q231 Congenital insufficiency of aortic valve: Secondary | ICD-10-CM | POA: Diagnosis not present

## 2020-11-30 ENCOUNTER — Other Ambulatory Visit (HOSPITAL_COMMUNITY): Payer: Medicare HMO

## 2020-12-03 DIAGNOSIS — E1169 Type 2 diabetes mellitus with other specified complication: Secondary | ICD-10-CM | POA: Diagnosis not present

## 2020-12-03 DIAGNOSIS — E7800 Pure hypercholesterolemia, unspecified: Secondary | ICD-10-CM | POA: Diagnosis not present

## 2020-12-03 DIAGNOSIS — I480 Paroxysmal atrial fibrillation: Secondary | ICD-10-CM | POA: Diagnosis not present

## 2020-12-03 DIAGNOSIS — I10 Essential (primary) hypertension: Secondary | ICD-10-CM | POA: Diagnosis not present

## 2020-12-05 ENCOUNTER — Other Ambulatory Visit: Payer: Self-pay

## 2020-12-05 ENCOUNTER — Ambulatory Visit: Payer: Medicare HMO | Admitting: Podiatry

## 2020-12-05 DIAGNOSIS — B351 Tinea unguium: Secondary | ICD-10-CM

## 2020-12-05 DIAGNOSIS — Z79899 Other long term (current) drug therapy: Secondary | ICD-10-CM

## 2020-12-05 NOTE — Patient Instructions (Signed)
Terbinafine tablets What is this medicine? TERBINAFINE (TER bin a feen) is an antifungal medicine. It is used to treat certain kinds of fungal or yeast infections. This medicine may be used for other purposes; ask your health care provider or pharmacist if you have questions. COMMON BRAND NAME(S): Lamisil, Terbinex What should I tell my health care provider before I take this medicine? They need to know if you have any of these conditions:  drink alcoholic beverages  kidney disease  liver disease  an unusual or allergic reaction to terbinafine, other medicines, foods, dyes, or preservatives  pregnant or trying to get pregnant  breast-feeding How should I use this medicine? Take this medicine by mouth with a full glass of water. Follow the directions on the prescription label. You can take this medicine with food or on an empty stomach. Take your medicine at regular intervals. Do not take your medicine more often than directed. Do not skip doses or stop your medicine early even if you feel better. Do not stop taking except on your doctor's advice. A special MedGuide will be given to you by the pharmacist with each prescription and refill. Be sure to read this information carefully each time. Talk to your pediatrician regarding the use of this medicine in children. Special care may be needed. Overdosage: If you think you have taken too much of this medicine contact a poison control center or emergency room at once. NOTE: This medicine is only for you. Do not share this medicine with others. What if I miss a dose? If you miss a dose, take it as soon as you can. If it is almost time for your next dose, take only that dose. Do not take double or extra doses. What may interact with this medicine? Do not take this medicine with any of the following medications:  thioridazine This medicine may also interact with the following  medications:  beta-blockers  caffeine  cimetidine  cyclosporine  medicines for depression, anxiety, or psychotic disturbances  medicines for fungal infections like fluconazole and ketoconazole  medicines for irregular heartbeat like amiodarone, flecainide and propafenone  rifampin  warfarin This list may not describe all possible interactions. Give your health care provider a list of all the medicines, herbs, non-prescription drugs, or dietary supplements you use. Also tell them if you smoke, drink alcohol, or use illegal drugs. Some items may interact with your medicine. What should I watch for while using this medicine? Visit your doctor or health care provider regularly. Tell your doctor right away if you have nausea or vomiting, loss of appetite, stomach pain on your right upper side, yellow skin, dark urine, light stools, or are over tired. Some fungal infections need many weeks or months of treatment to cure. If you are taking this medicine for a long time, you will need to have important blood work done. This medicine may cause serious skin reactions. They can happen weeks to months after starting the medicine. Contact your health care provider right away if you notice fevers or flu-like symptoms with a rash. The rash may be red or purple and then turn into blisters or peeling of the skin. Or, you might notice a red rash with swelling of the face, lips or lymph nodes in your neck or under your arms. What side effects may I notice from receiving this medicine? Side effects that you should report to your doctor or health care professional as soon as possible:  allergic reactions like skin rash or hives,   swelling of the face, lips, or tongue  changes in vision  dark urine  fever or infection  general ill feeling or flu-like symptoms  light-colored stools  loss of appetite, nausea  rash, fever, and swollen lymph nodes  redness, blistering, peeling or loosening of the  skin, including inside the mouth  right upper belly pain  unusually weak or tired  yellowing of the eyes or skin Side effects that usually do not require medical attention (report to your doctor or health care professional if they continue or are bothersome):  changes in taste  diarrhea  hair loss  muscle or joint pain  stomach gas  stomach upset This list may not describe all possible side effects. Call your doctor for medical advice about side effects. You may report side effects to FDA at 1-800-FDA-1088. Where should I keep my medicine? Keep out of the reach of children. Store at room temperature below 25 degrees C (77 degrees F). Protect from light. Throw away any unused medicine after the expiration date. NOTE: This sheet is a summary. It may not cover all possible information. If you have questions about this medicine, talk to your doctor, pharmacist, or health care provider.  2021 Elsevier/Gold Standard (2018-12-02 15:37:07)  

## 2020-12-06 ENCOUNTER — Other Ambulatory Visit (HOSPITAL_COMMUNITY): Payer: Medicare HMO

## 2020-12-06 ENCOUNTER — Other Ambulatory Visit: Payer: Self-pay | Admitting: Podiatry

## 2020-12-06 LAB — CBC WITH DIFFERENTIAL/PLATELET
Absolute Monocytes: 675 cells/uL (ref 200–950)
Basophils Absolute: 60 cells/uL (ref 0–200)
Basophils Relative: 0.8 %
Eosinophils Absolute: 218 cells/uL (ref 15–500)
Eosinophils Relative: 2.9 %
HCT: 48.5 % (ref 38.5–50.0)
Hemoglobin: 16.1 g/dL (ref 13.2–17.1)
Lymphs Abs: 1770 cells/uL (ref 850–3900)
MCH: 29.8 pg (ref 27.0–33.0)
MCHC: 33.2 g/dL (ref 32.0–36.0)
MCV: 89.6 fL (ref 80.0–100.0)
MPV: 10.4 fL (ref 7.5–12.5)
Monocytes Relative: 9 %
Neutro Abs: 4778 cells/uL (ref 1500–7800)
Neutrophils Relative %: 63.7 %
Platelets: 269 10*3/uL (ref 140–400)
RBC: 5.41 10*6/uL (ref 4.20–5.80)
RDW: 12.3 % (ref 11.0–15.0)
Total Lymphocyte: 23.6 %
WBC: 7.5 10*3/uL (ref 3.8–10.8)

## 2020-12-06 LAB — HEPATIC FUNCTION PANEL
AG Ratio: 1.8 (calc) (ref 1.0–2.5)
ALT: 17 U/L (ref 9–46)
AST: 15 U/L (ref 10–35)
Albumin: 4.6 g/dL (ref 3.6–5.1)
Alkaline phosphatase (APISO): 44 U/L (ref 35–144)
Bilirubin, Direct: 0.1 mg/dL (ref 0.0–0.2)
Globulin: 2.6 g/dL (calc) (ref 1.9–3.7)
Indirect Bilirubin: 0.4 mg/dL (calc) (ref 0.2–1.2)
Total Bilirubin: 0.5 mg/dL (ref 0.2–1.2)
Total Protein: 7.2 g/dL (ref 6.1–8.1)

## 2020-12-06 MED ORDER — TERBINAFINE HCL 250 MG PO TABS: 250.0000 mg | ORAL_TABLET | Freq: Every day | ORAL | 0 refills | Status: DC

## 2020-12-06 NOTE — Progress Notes (Signed)
Subjective: 75 year old male presents the office today for follow-up evaluation of onychomycosis on Lamisil.  He has 1 more day of the medication.  He states that overall the nails are improving but still having the thickening discoloration.  No pain in the nails and no redness or drainage. Denies any systemic complaints such as fevers, chills, nausea, vomiting. No acute changes since last appointment, and no other complaints at this time.   Objective: AAO x3, NAD DP/PT pulses palpable bilaterally, CRT less than 3 seconds The nails most affected today are the hallux toenails as well as the right third digit toenail.  There is clearing on the proximal fold of the hallux nails.  There is minimal clear on the right third toenail proximal fold.  The remainder the nails are hypertrophic, dystrophic with yellow and brown discoloration.  No pain with calf compression, swelling, warmth, erythema  Assessment: Onychomycosis, currently on Lamisil  Plan: -All treatment options discussed with the patient including all alternatives, risks, complications.  -He is improving on the Lamisil.  We will continue for 30 more days of this.  We will recheck a CBC and LFT prior to starting.  We did discuss nail removal.  I will see him back in 2 months at that point if needed we can consider removing likely the right third digit toenail permanently. -Patient encouraged to call the office with any questions, concerns, change in symptoms.   Vivi Barrack DPM

## 2020-12-18 ENCOUNTER — Telehealth: Payer: Self-pay | Admitting: *Deleted

## 2020-12-18 NOTE — Telephone Encounter (Signed)
Called and spoke with the patient today and stated that I was calling to see if the patient had picked up the lamisil due to the insurance was requesting authorization and per the patient the patient called and spoke with the insurance and got the medicine approved. Misty Stanley

## 2021-01-08 DIAGNOSIS — E1169 Type 2 diabetes mellitus with other specified complication: Secondary | ICD-10-CM | POA: Diagnosis not present

## 2021-01-08 DIAGNOSIS — I10 Essential (primary) hypertension: Secondary | ICD-10-CM | POA: Diagnosis not present

## 2021-01-08 DIAGNOSIS — I480 Paroxysmal atrial fibrillation: Secondary | ICD-10-CM | POA: Diagnosis not present

## 2021-01-08 DIAGNOSIS — E7800 Pure hypercholesterolemia, unspecified: Secondary | ICD-10-CM | POA: Diagnosis not present

## 2021-01-12 ENCOUNTER — Other Ambulatory Visit: Payer: Self-pay | Admitting: Cardiology

## 2021-01-25 DIAGNOSIS — Z23 Encounter for immunization: Secondary | ICD-10-CM | POA: Diagnosis not present

## 2021-01-30 ENCOUNTER — Other Ambulatory Visit: Payer: Self-pay | Admitting: *Deleted

## 2021-01-30 DIAGNOSIS — R001 Bradycardia, unspecified: Secondary | ICD-10-CM

## 2021-01-30 DIAGNOSIS — I10 Essential (primary) hypertension: Secondary | ICD-10-CM

## 2021-01-30 DIAGNOSIS — I480 Paroxysmal atrial fibrillation: Secondary | ICD-10-CM

## 2021-02-04 ENCOUNTER — Ambulatory Visit: Payer: Medicare HMO | Admitting: Podiatry

## 2021-02-11 ENCOUNTER — Ambulatory Visit: Payer: Medicare HMO | Admitting: Podiatry

## 2021-02-11 ENCOUNTER — Other Ambulatory Visit: Payer: Self-pay

## 2021-02-11 DIAGNOSIS — B351 Tinea unguium: Secondary | ICD-10-CM

## 2021-02-11 MED ORDER — EFINACONAZOLE 10 % EX SOLN: 1.0000 [drp] | Freq: Every day | CUTANEOUS | 11 refills | Status: DC

## 2021-02-16 NOTE — Progress Notes (Signed)
Subjective: 75 year old male presents the office today for follow-up evaluation of onychomycosis.  He has finished the course of Lamisil.  He states the nails are getting somewhat better.  Denies any pain the nails no redness or drainage or any swelling.    Objective: AAO x3, NAD DP/PT pulses palpable bilaterally, CRT less than 3 seconds The nails most affected today are the hallux toenails as well as the right third digit toenail.  There is clearing on the proximal nail folds.  There is no edema, erythema or signs of infection.  No pain in the nails today. No open lesions.  Assessment: Onychomycosis  Plan: -All treatment options discussed with the patient including all alternatives, risks, complications.  -Is finished Lamisil the nails have made some improvement although mild.  We discussed that removal but that they are getting better there is no pain order to hold off on this.  Prescribed Jublia to continue to allow this to grow out.  Stephen Mullins DPM

## 2021-02-18 DIAGNOSIS — I8312 Varicose veins of left lower extremity with inflammation: Secondary | ICD-10-CM | POA: Diagnosis not present

## 2021-03-11 DIAGNOSIS — I8312 Varicose veins of left lower extremity with inflammation: Secondary | ICD-10-CM | POA: Diagnosis not present

## 2021-03-12 ENCOUNTER — Other Ambulatory Visit: Payer: Self-pay | Admitting: Cardiology

## 2021-03-12 DIAGNOSIS — R001 Bradycardia, unspecified: Secondary | ICD-10-CM

## 2021-03-12 DIAGNOSIS — I4589 Other specified conduction disorders: Secondary | ICD-10-CM

## 2021-03-12 DIAGNOSIS — I10 Essential (primary) hypertension: Secondary | ICD-10-CM

## 2021-03-12 DIAGNOSIS — I493 Ventricular premature depolarization: Secondary | ICD-10-CM

## 2021-03-18 ENCOUNTER — Ambulatory Visit (INDEPENDENT_AMBULATORY_CARE_PROVIDER_SITE_OTHER): Payer: Medicare HMO

## 2021-03-18 ENCOUNTER — Other Ambulatory Visit: Payer: Self-pay

## 2021-03-18 DIAGNOSIS — R001 Bradycardia, unspecified: Secondary | ICD-10-CM

## 2021-03-18 DIAGNOSIS — I10 Essential (primary) hypertension: Secondary | ICD-10-CM

## 2021-03-18 DIAGNOSIS — I4589 Other specified conduction disorders: Secondary | ICD-10-CM

## 2021-03-18 DIAGNOSIS — I493 Ventricular premature depolarization: Secondary | ICD-10-CM | POA: Diagnosis not present

## 2021-03-18 LAB — EXERCISE TOLERANCE TEST
Estimated workload: 8 METS
Exercise duration (min): 6 min
Exercise duration (sec): 42 s
MPHR: 145 {beats}/min
Peak HR: 141 {beats}/min
Percent HR: 97 %
RPE: 15
Rest HR: 61 {beats}/min

## 2021-03-21 ENCOUNTER — Encounter: Payer: Self-pay | Admitting: Cardiology

## 2021-03-21 ENCOUNTER — Ambulatory Visit: Payer: Medicare HMO | Admitting: Cardiology

## 2021-03-21 ENCOUNTER — Other Ambulatory Visit: Payer: Self-pay

## 2021-03-21 VITALS — BP 150/72 | HR 70 | Ht 71.0 in | Wt 193.0 lb

## 2021-03-21 DIAGNOSIS — Q2381 Bicuspid aortic valve: Secondary | ICD-10-CM

## 2021-03-21 DIAGNOSIS — I77810 Thoracic aortic ectasia: Secondary | ICD-10-CM | POA: Diagnosis not present

## 2021-03-21 DIAGNOSIS — Q231 Congenital insufficiency of aortic valve: Secondary | ICD-10-CM

## 2021-03-21 DIAGNOSIS — Z01818 Encounter for other preprocedural examination: Secondary | ICD-10-CM

## 2021-03-21 DIAGNOSIS — Z952 Presence of prosthetic heart valve: Secondary | ICD-10-CM | POA: Diagnosis not present

## 2021-03-21 DIAGNOSIS — I480 Paroxysmal atrial fibrillation: Secondary | ICD-10-CM | POA: Diagnosis not present

## 2021-03-21 DIAGNOSIS — Z01812 Encounter for preprocedural laboratory examination: Secondary | ICD-10-CM

## 2021-03-21 DIAGNOSIS — R9439 Abnormal result of other cardiovascular function study: Secondary | ICD-10-CM | POA: Diagnosis not present

## 2021-03-21 NOTE — Patient Instructions (Addendum)
Medication Instructions:   The current medical regimen is effective;  continue present plan and medications.  *If you need a refill on your cardiac medications before your next appointment, please call your pharmacy*   Lab Work: Please have blood work (CBC, BMP - precath)on Monday 03/24/2021. If you have labs (blood work) drawn today and your tests are completely normal, you will receive your results only by: MyChart Message (if you have MyChart) OR A paper copy in the mail If you have any lab test that is abnormal or we need to change your treatment, we will call you to review the results.   Testing/Procedures:    [facility] CARDIOVASCULAR DIVISION [facility] [address] OFFICE [address] [place] [address] Dept: [phone] Loc: [phone]  Stephen Mullins  03/21/2021  You are scheduled for a Cardiac Catheterization on Thursday July 21,2022   with Dr. Tresa Endo    1. Please arrive at the [facility] (Main Entrance A) at [facility]: [address] [place], [place] [address] at    7AM    (two hours before your procedure to ensure your preparation). Free valet parking service is available.   Special note: Every effort is made to have your procedure done on time. Please understand that emergencies sometimes delay scheduled procedures.  2. Diet: Do not eat or drink anything after midnight prior to your procedure except sips of water to take medications.  3. Labs:  CBC, BMP  - to be decided  4. Medication instructions in preparation for your procedure:  On the morning of your procedure, take your Aspirin and any morning medicines NOT listed above.  You may use sips of water.  5. Plan for one night stay--bring personal belongings. 6. Bring a current list of your medications and current insurance cards. 7. You MUST have a responsible person to drive you home. 8. Someone MUST be with you the first 24 hours after you arrive  home or your discharge will be delayed. 9. Please wear clothes that are easy to get on and off and wear slip-on shoes.  Thank you for allowing [place] to care for you!   -- [place] [facility]  Follow-Up: At [facility], you and your health needs are our priority.  As part of our continuing mission to provide you with exceptional heart care, we have created designated Provider Care Teams.  These Care Teams include your primary Cardiologist (physician) and Advanced Practice Providers (APPs -  Physician Assistants and Nurse Practitioners) who all work together to provide you with the care you need, when you need it.  We recommend signing up for the patient portal called "MyChart".  Sign up information is provided on this After Visit Summary.  MyChart is used to connect with patients for Virtual Visits (Telemedicine).  Patients are able to view lab/test results, encounter notes, upcoming appointments, etc.  Non-urgent messages can be sent to your provider as well.   To learn more about what you can do with MyChart, go to [email or web address].    Your next appointment:   3 month(s)  The format for your next appointment:   In Person  Provider:   You may see Donato Schultz, MD or one of the following Advanced Practice Providers on your designated Care Team:   Nada Boozer, NP   Thank you for choosing [facility]!!

## 2021-03-21 NOTE — Progress Notes (Signed)
Cardiology Office Note:    Date:  03/21/2021   ID:  Stephen Mullins, DOB 14-Jan-1946, MRN [id number]  PCP:  Soundra Pilon, FNP   [facility] Providers Cardiologist:  Donato Schultz, MD     Referring MD: Soundra Pilon, FNP     History of Present Illness:    Stephen Mullins is a 75 y.o. male here for follow up to discuss abnormal GXT per MS. his treadmill showed marked ST segment depression diffusely with aVR elevation which is suggestive of multivessel coronary artery disease.  Interestingly in 2019 prior to his aortic valve replacement valve, he had mild nonobstructive disease noted on cardiac cath.  In the past he underwent an aortic valve replacement in April 2019 with a number 25 mm bioprosthetic valve.  LV function normal.  Dilated aorta.  Doing well.   Current EKG today shows sinus rhythm with ventricular bigeminy heart rate 52 but effective heart rate likely in the 30s secondary to PVCs.  No dizziness no syncope.  Walking 1 to 2 miles a day.  Today, he is doing reasonably well today. After his stress test on 03/18/21 he has no adverse effect. Denies having any shortness of breath, chest pain, palpitations, LE edema, orthopnea, or PND. He never has negative symptoms when working outside or walking around his home. Otherwise is tolerating his medication well.   Past Medical History:  Diagnosis Date   Bicuspid aortic valve    with aortic stenosis and mild insufficiency, mild to moderate aortic root dilitation   Borderline hyperlipidemia    Diabetes mellitus without complication (HCC)    Elevated fasting glucose    Elevated PSA    due to prostatitis   GERD (gastroesophageal reflux disease)    Heart murmur    Hypertension    Nail fungus    Thoracic ascending aortic aneurysm [facility])     Past Surgical History:  Procedure Laterality Date   AORTIC VALVE REPLACEMENT N/A 12/20/2017   Procedure: AORTIC VALVE REPLACEMENT (AVR);  Surgeon: [name], [name], MD;  Location: [facility] OR;   Service: Open Heart Surgery;  Laterality: N/A;   CATARACT EXTRACTION, BILATERAL     RIGHT/LEFT HEART CATH AND CORONARY ANGIOGRAPHY N/A 11/09/2017   Procedure: RIGHT/LEFT HEART CATH AND CORONARY ANGIOGRAPHY;  Surgeon: [name], MD;  Location: [facility] INVASIVE CV LAB;  Service: Cardiovascular;  Laterality: N/A;   TEE WITHOUT CARDIOVERSION N/A 12/20/2017   Procedure: TRANSESOPHAGEAL ECHOCARDIOGRAM (TEE);  Surgeon: [name], [name], MD;  Location: [facility] OR;  Service: Open Heart Surgery;  Laterality: N/A;    Current Medications: Current Meds  Medication Sig   acetaminophen (TYLENOL) 500 MG tablet Take 2 tablets (1,000 mg total) by mouth every 6 (six) hours as needed.   amoxicillin (AMOXIL) 500 MG capsule TAKE 4 TABLETS BY MOUTH 1 HOUR BEFORE DENTAL PROCEDURE   aspirin 81 MG tablet Take 81 mg by mouth daily.   atorvastatin (LIPITOR) 10 MG tablet Take 10 mg by mouth daily.   Efinaconazole 10 % SOLN Apply 1 drop topically daily.   hydrochlorothiazide (HYDRODIURIL) 12.5 MG tablet Take 1 tablet by mouth once daily   hydrocortisone cream 1 % Apply 1 application topically daily as needed for itching.   lisinopril (ZESTRIL) 5 MG tablet Take 2.5 mg by mouth daily.   neomycin-bacitracin-polymyxin (NEOSPORIN) ointment Apply 1 application topically daily as needed for wound care.   NONFORMULARY OR COMPOUNDED ITEM [facility]: Antifungal nail lacquer - Fluconazole 2%, Terbinafine 1%, DMSO.   oxymetazoline (  AFRIN) 0.05 % nasal spray Place 1 spray into both nostrils daily as needed for congestion.   SHINGRIX injection      Allergies:   Tetanus toxoid, adsorbed   Social History   Socioeconomic History   Marital status: Married    Spouse name: Not on file   Number of children: Not on file   Years of education: Not on file   Highest education level: Not on file  Occupational History   Occupation: retired  Tobacco Use   Smoking status: Former    Types: Cigarettes    Quit date: 10/20/2001     Years since quitting: 19.4   Smokeless tobacco: Never  Vaping Use   Vaping Use: Never used  Substance and Sexual Activity   Alcohol use: Yes    Alcohol/week: 1.0 - 2.0 standard drink    Types: 1 - 2 Glasses of wine per week   Drug use: No   Sexual activity: Not on file  Other Topics Concern   Not on file  Social History Narrative   Not on file   Social Determinants of Health   Financial Resource Strain: Not on file  Food Insecurity: Not on file  Transportation Needs: Not on file  Physical Activity: Not on file  Stress: Not on file  Social Connections: Not on file     Family History: The patient's  family history includes Prostate cancer in his father.  ROS:   Please see the history of present illness. All other systems reviewed and are negative.  EKGs/Labs/Other Studies Reviewed:    The following studies were reviewed today: 03/18/21 Exercise Tolerance Test:  Study Highlights Blood pressure demonstrated a normal response to exercise. ST segment depression of 5 mm was noted during stress in the II, III, aVF, V5 and V6 leads.   ETT with fair exercise tolerance (6:42); no chest pain but patient did complain of dyspnea; normal blood pressure response; 3 beats nonsustained ventricular tachycardia and frequent PVCs with exercise and 5 beats nonsustained ventricular tachycardia in recovery; 4 to 5 mm of ST depression in the inferior lateral leads and 3 mm ST elevation in AvR; abnormal ETT. Results discussed with Dr Anne Fu.  07/24/2020 Echo:  IMPRESSIONS   1. Frequent PVC's are noted. Left ventricular ejection fraction, by  estimation, is 65 to 70%. The left ventricle has normal function. The left  ventricle has no regional wall motion abnormalities. There is mild  eccentric left ventricular hypertrophy of  the basal-septal segment. Left ventricular diastolic parameters are  consistent with Grade I diastolic dysfunction (impaired relaxation).   2. The mitral valve is  abnormal. Trivial mitral valve regurgitation.   3. The aortic valve has been repaired/replaced. There is a 25 mm Edwards  Inspiris bioprosthetic valve noted in the aortic position. Aortic valve  regurgitation is not visualized. Aortic valve area, by VTI measures 2.19  cm. Aortic valve mean gradient  measures 10.0 mmHg. Aortic valve Vmax measures 2.38 m/s. DI is 0.45.   4. Aortic dilatation noted. There is mild dilatation of the aortic root,  measuring 40 mm. There is mild to moderate dilatation of the ascending  aorta, measuring 44 mm.   5. Right ventricular systolic function is mildly reduced. The right  ventricular size is normal. There is normal pulmonary artery systolic  pressure.   6. The inferior vena cava is normal in size with greater than 50%  respiratory variability, suggesting right atrial pressure of 3 mmHg.   Comparison(s): Changes from prior study  are noted. 01/25/2018: LVEF 60-65%,  bioprosthetic AVR - mean gradient 11 mmHg, aorta dilated to 42 mm.   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 This does not appear to impinge upon the left coronary artery.   Stable thoracic aortic aneurysm with maximal diameter of 4.0 x 4.0 cm.  EKG:   03/21/2021- EKG was not ordered today  Recent Labs: 12/05/2020: ALT 17; Hemoglobin 16.1; Platelets 269  Recent Lipid Panel No results found for: CHOL, TRIG, HDL, CHOLHDL, VLDL, LDLCALC, LDLDIRECT   Risk Assessment/Calculations:          Physical Exam:    VS:  BP (!) 150/72 (BP Location: Left Arm, Patient Position: Sitting, Cuff Size: Normal)   Pulse 70   Ht 5\' 11"  (1.803 m)   Wt 193 lb (87.5 kg)   SpO2 97%   BMI 26.92 kg/m     Wt Readings from Last 3 Encounters:  03/21/21 193 lb (87.5 kg)  11/21/20 196 lb (88.9 kg)  07/03/20 197 lb 9.6 oz (89.6 kg)     GEN:  Well nourished, well developed in no acute distress HEENT: Normal NECK: No JVD; No carotid  bruits LYMPHATICS: No lymphadenopathy CARDIAC: RRR, soft systolic murmurs, rubs, gallops RESPIRATORY:  Clear to auscultation without rales, wheezing or rhonchi  ABDOMEN: Soft, non-tender, non-distended MUSCULOSKELETAL:  No edema; No deformity  SKIN: Warm and dry NEUROLOGIC:  Alert and oriented x 3 PSYCHIATRIC:  Normal affect   ASSESSMENT:    1. Paroxysmal atrial fibrillation (HCC)   2. S/P AVR (aortic valve replacement)   3. Dilated aortic root (HCC)   4. Bicuspid aortic valve   5. Pre-procedure lab exam   6. Abnormal cardiovascular stress test    PLAN:    In order of problems listed above:  Abnormal exercise treadmill test - Markedly abnormal with marked ST segment depression noted as above.  aVR elevation.  Concerning for multivessel CAD.  Could he have had progression of his mild coronary artery disease since his cardiac catheterization in 2019.  Hopefully not.  Hopefully this is a false positive reading however he deserves a cardiac catheterization for further evaluation.  Risks and benefits including stroke heart attack death renal impairment bleeding have been discussed.  He is willing to proceed. -LV function on echocardiogram in November 2021 was normal.  Aortic valve replacement bioprosthetic in 2019 -Overall doing well with echocardiogram as above.  Bioprosthetic mean gradient 11.   Postop atrial fibrillation -No further issues.   Resting bradycardia -Prior EKG did show ventricular bigeminy, frequent PVCs with likely effective heart rate in the 30s secondary to PVCs at times.  He does not really feel any major symptoms.  He is doing his yard work, walking.  During the treadmill test when he pushed himself quite hard he was experiencing dyspnea.   Thoracic aortic aneurysm -Aorta measured 44 mm on echocardiogram ascending root.  Prior bicuspid aortic valve.   Essential hypertension -Slightly elevated today.  Continuing with current medical management with lisinopril  2.5 mg a day hydrochlorothiazide 12.5 mg a day.  Hyperlipidemia - Continue with atorvastatin 10 mg a day.   Shared Decision Making/Informed Consent The risks [stroke (1 in 1000), death (1 in 1000), kidney failure [usually temporary] (1 in 500), bleeding (1 in 200), allergic reaction [possibly serious] (1 in 200)], benefits (diagnostic support and management of coronary artery disease) and alternatives of a cardiac catheterization were discussed in detail with Stephen Mullins and  he is willing to proceed.    Medication Adjustments/Labs and Tests Ordered: Current medicines are reviewed at length with the patient today.  Concerns regarding medicines are outlined above.  Orders Placed This Encounter  Procedures   CBC   Basic metabolic panel    No orders of the defined types were placed in this encounter.   Patient Instructions  Medication Instructions:   The current medical regimen is effective;  continue present plan and medications.  *If you need a refill on your cardiac medications before your next appointment, please call your pharmacy*   Lab Work: Please have blood work (CBC, BMP - precath) If you have labs (blood work) drawn today and your tests are completely normal, you will receive your results only by: MyChart Message (if you have MyChart) OR A paper copy in the mail If you have any lab test that is abnormal or we need to change your treatment, we will call you to review the results.   Testing/Procedures:    [facility] [address] OFF[address] [place] [address] Dept: [phone] Loc: [phone]  Stephen LivelyDavid E Mullins  03/21/2021  You are scheduled for a Cardiac Catheterization on      with Dr.      1. [facility] arrive [facility] the [facility] (Main [facility]ance A) [facility]: [address] [place], [place] [address] at        (two hours before your procedure to ensure your  preparation). Free valet parking service is available.   Special note: Every effort is made to have your procedure done on time. Please understand that emergencies sometimes delay scheduled procedures.  2. Diet: Do not eat or drink anything after midnight prior to your procedure except sips of water to take medications.  3. Labs:  CBC, BMP  - to be decided  4. Medication instructions in preparation for your procedure:  On the morning of your procedure, take your Aspirin and any morning medicines NOT listed above.  You may use sips of water.  5. Plan for one night stay--bring personal belongings. 6. Bring a current list of your medications and current insurance cards. 7. You MUST have a responsible person to drive you home. 8. Someone MUST be with you the first 24 hours after you arrive home or your discharge will be delayed. 9. Please wear clothes that are easy to get on and off and wear slip-on shoes.  Thank you for allowing us to care for you!   -- [place] [facility]  Follow-Up: At [facility], you and your health needs are our priority.  As part of our continuing mission to provide you with exceptional heart care, we have created designated Provider Care Teams.  These Care Teams include your primary Cardiologist (physician) and Advanced Practice Providers (APPs -  Physician Assistants and Nurse Practitioners) who all work together to provide you with the care you need, when you need it.  We recommend signing up for the patient portal called "MyChart".  Sign up information is provided on this After Visit Summary.  MyChart is used to connect with patients for Virtual Visits (Telemedicine).  Patients are able to view lab/test results, encounter notes, upcoming appointments, etc.  Non-urgent messages can be sent to your provider as well.   To learn more about what you can do with MyChart, go to [email or web address].    Your next appointment:   3  month(s)  The format for your next appointment:   In Person  Provider:   You may see Donato Schultz, MD or one of the following Advanced Practice Providers on your designated Care Team:   Nada Boozer, NP   Thank you for choosing [facility]!!      I,Alexis Bryant,acting as a scribe for Donato Schultz, MD.,have documented all relevant documentation on the behalf of Donato Schultz, MD,as directed by  Donato Schultz, MD while in the presence of Donato Schultz, MD.   I, Donato Schultz, MD, have reviewed all documentation for this visit. The documentation on 03/21/21 for the exam, diagnosis, procedures, and orders are all accurate and complete.  Signed, Donato Schultz, MD  03/21/2021 10:06 AM    [facility]

## 2021-03-24 ENCOUNTER — Other Ambulatory Visit: Payer: Medicare HMO | Admitting: *Deleted

## 2021-03-24 ENCOUNTER — Other Ambulatory Visit: Payer: Self-pay

## 2021-03-24 DIAGNOSIS — Z01812 Encounter for preprocedural laboratory examination: Secondary | ICD-10-CM

## 2021-03-24 DIAGNOSIS — R9439 Abnormal result of other cardiovascular function study: Secondary | ICD-10-CM

## 2021-03-24 LAB — CBC
Hematocrit: 45.9 % (ref 37.5–51.0)
Hemoglobin: 15.4 g/dL (ref 13.0–17.7)
MCH: 29.8 pg (ref 26.6–33.0)
MCHC: 33.6 g/dL (ref 31.5–35.7)
MCV: 89 fL (ref 79–97)
Platelets: 239 10*3/uL (ref 150–450)
RBC: 5.16 x10E6/uL (ref 4.14–5.80)
RDW: 13.4 % (ref 11.6–15.4)
WBC: 6.9 10*3/uL (ref 3.4–10.8)

## 2021-03-24 LAB — BASIC METABOLIC PANEL
BUN/Creatinine Ratio: 18 (ref 10–24)
BUN: 15 mg/dL (ref 8–27)
CO2: 30 mmol/L — ABNORMAL HIGH (ref 20–29)
Calcium: 10.5 mg/dL — ABNORMAL HIGH (ref 8.6–10.2)
Chloride: 102 mmol/L (ref 96–106)
Creatinine, Ser: 0.83 mg/dL (ref 0.76–1.27)
Glucose: 121 mg/dL — ABNORMAL HIGH (ref 65–99)
Potassium: 4.3 mmol/L (ref 3.5–5.2)
Sodium: 139 mmol/L (ref 134–144)
eGFR: 91 mL/min/{1.73_m2} (ref >59)

## 2021-03-25 ENCOUNTER — Encounter (HOSPITAL_COMMUNITY): Payer: Self-pay | Admitting: Emergency Medicine

## 2021-03-25 ENCOUNTER — Other Ambulatory Visit: Payer: Self-pay

## 2021-03-25 ENCOUNTER — Observation Stay (HOSPITAL_COMMUNITY): Payer: Medicare HMO

## 2021-03-25 ENCOUNTER — Emergency Department (HOSPITAL_COMMUNITY): Payer: Medicare HMO

## 2021-03-25 ENCOUNTER — Inpatient Hospital Stay (HOSPITAL_COMMUNITY)
Admission: EM | Admit: 2021-03-25 | Discharge: 2021-03-30 | DRG: 039 | Disposition: A | Discharge status: Home / Self Care | Payer: Medicare HMO | Attending: Family Medicine | Admitting: Family Medicine

## 2021-03-25 ENCOUNTER — Telehealth: Payer: Self-pay | Admitting: Cardiology

## 2021-03-25 DIAGNOSIS — I639 Cerebral infarction, unspecified: Secondary | ICD-10-CM | POA: Diagnosis present

## 2021-03-25 DIAGNOSIS — I712 Thoracic aortic aneurysm, without rupture: Secondary | ICD-10-CM | POA: Diagnosis present

## 2021-03-25 DIAGNOSIS — Z8042 Family history of malignant neoplasm of prostate: Secondary | ICD-10-CM

## 2021-03-25 DIAGNOSIS — I2510 Atherosclerotic heart disease of native coronary artery without angina pectoris: Secondary | ICD-10-CM | POA: Diagnosis present

## 2021-03-25 DIAGNOSIS — R4701 Aphasia: Secondary | ICD-10-CM | POA: Diagnosis present

## 2021-03-25 DIAGNOSIS — R29810 Facial weakness: Secondary | ICD-10-CM | POA: Diagnosis present

## 2021-03-25 DIAGNOSIS — I771 Stricture of artery: Secondary | ICD-10-CM

## 2021-03-25 DIAGNOSIS — I10 Essential (primary) hypertension: Secondary | ICD-10-CM | POA: Diagnosis present

## 2021-03-25 DIAGNOSIS — G319 Degenerative disease of nervous system, unspecified: Secondary | ICD-10-CM | POA: Diagnosis not present

## 2021-03-25 DIAGNOSIS — I63232 Cerebral infarction due to unspecified occlusion or stenosis of left carotid arteries: Principal | ICD-10-CM | POA: Diagnosis present

## 2021-03-25 DIAGNOSIS — G9389 Other specified disorders of brain: Secondary | ICD-10-CM | POA: Diagnosis not present

## 2021-03-25 DIAGNOSIS — K219 Gastro-esophageal reflux disease without esophagitis: Secondary | ICD-10-CM | POA: Diagnosis present

## 2021-03-25 DIAGNOSIS — Z7902 Long term (current) use of antithrombotics/antiplatelets: Secondary | ICD-10-CM

## 2021-03-25 DIAGNOSIS — R471 Dysarthria and anarthria: Secondary | ICD-10-CM | POA: Diagnosis present

## 2021-03-25 DIAGNOSIS — Z7982 Long term (current) use of aspirin: Secondary | ICD-10-CM

## 2021-03-25 DIAGNOSIS — Z953 Presence of xenogenic heart valve: Secondary | ICD-10-CM

## 2021-03-25 DIAGNOSIS — I6529 Occlusion and stenosis of unspecified carotid artery: Secondary | ICD-10-CM

## 2021-03-25 DIAGNOSIS — I77810 Thoracic aortic ectasia: Secondary | ICD-10-CM

## 2021-03-25 DIAGNOSIS — R339 Retention of urine, unspecified: Secondary | ICD-10-CM | POA: Diagnosis present

## 2021-03-25 DIAGNOSIS — Z79899 Other long term (current) drug therapy: Secondary | ICD-10-CM

## 2021-03-25 DIAGNOSIS — E7800 Pure hypercholesterolemia, unspecified: Secondary | ICD-10-CM | POA: Diagnosis present

## 2021-03-25 DIAGNOSIS — Z87891 Personal history of nicotine dependence: Secondary | ICD-10-CM

## 2021-03-25 DIAGNOSIS — Z20822 Contact with and (suspected) exposure to covid-19: Secondary | ICD-10-CM | POA: Diagnosis present

## 2021-03-25 DIAGNOSIS — E119 Type 2 diabetes mellitus without complications: Secondary | ICD-10-CM | POA: Diagnosis present

## 2021-03-25 DIAGNOSIS — G459 Transient cerebral ischemic attack, unspecified: Secondary | ICD-10-CM | POA: Diagnosis not present

## 2021-03-25 LAB — I-STAT CHEM 8, ED
BUN: 19 mg/dL (ref 8–23)
Calcium, Ion: 1.33 mmol/L (ref 1.15–1.40)
Chloride: 105 mmol/L (ref 98–111)
Creatinine, Ser: 1 mg/dL (ref 0.61–1.24)
Glucose, Bld: 108 mg/dL — ABNORMAL HIGH (ref 70–99)
HCT: 46 % (ref 39.0–52.0)
Hemoglobin: 15.6 g/dL (ref 13.0–17.0)
Potassium: 4.1 mmol/L (ref 3.5–5.1)
Sodium: 141 mmol/L (ref 135–145)
TCO2: 28 mmol/L (ref 22–32)

## 2021-03-25 LAB — URINALYSIS, ROUTINE W REFLEX MICROSCOPIC
Bacteria, UA: NONE SEEN
Bilirubin Urine: NEGATIVE
Glucose, UA: NEGATIVE mg/dL
Hgb urine dipstick: NEGATIVE
Ketones, ur: NEGATIVE mg/dL
Leukocytes,Ua: NEGATIVE
Nitrite: NEGATIVE
Protein, ur: NEGATIVE mg/dL
Specific Gravity, Urine: 1.023 (ref 1.005–1.030)
pH: 6 (ref 5.0–8.0)

## 2021-03-25 LAB — COMPREHENSIVE METABOLIC PANEL
ALT: 24 U/L (ref 0–44)
AST: 23 U/L (ref 15–41)
Albumin: 4 g/dL (ref 3.5–5.0)
Alkaline Phosphatase: 43 U/L (ref 38–126)
Anion gap: 8 (ref 5–15)
BUN: 16 mg/dL (ref 8–23)
CO2: 28 mmol/L (ref 22–32)
Calcium: 10 mg/dL (ref 8.9–10.3)
Chloride: 103 mmol/L (ref 98–111)
Creatinine, Ser: 1.09 mg/dL (ref 0.61–1.24)
GFR, Estimated: >60 mL/min (ref >60)
Glucose, Bld: 113 mg/dL — ABNORMAL HIGH (ref 70–99)
Potassium: 4.2 mmol/L (ref 3.5–5.1)
Sodium: 139 mmol/L (ref 135–145)
Total Bilirubin: 0.6 mg/dL (ref 0.3–1.2)
Total Protein: 6.8 g/dL (ref 6.5–8.1)

## 2021-03-25 LAB — CBC
HCT: 46.5 % (ref 39.0–52.0)
Hemoglobin: 15.4 g/dL (ref 13.0–17.0)
MCH: 29.7 pg (ref 26.0–34.0)
MCHC: 33.1 g/dL (ref 30.0–36.0)
MCV: 89.8 fL (ref 80.0–100.0)
Platelets: 250 10*3/uL (ref 150–400)
RBC: 5.18 MIL/uL (ref 4.22–5.81)
RDW: 12.5 % (ref 11.5–15.5)
WBC: 8.3 10*3/uL (ref 4.0–10.5)
nRBC: 0 % (ref 0.0–0.2)

## 2021-03-25 LAB — RAPID URINE DRUG SCREEN, HOSP PERFORMED
Amphetamines: NOT DETECTED
Barbiturates: NOT DETECTED
Benzodiazepines: NOT DETECTED
Cocaine: NOT DETECTED
Opiates: NOT DETECTED
Tetrahydrocannabinol: NOT DETECTED

## 2021-03-25 LAB — DIFFERENTIAL
Abs Immature Granulocytes: 0.03 10*3/uL (ref 0.00–0.07)
Basophils Absolute: 0.1 10*3/uL (ref 0.0–0.1)
Basophils Relative: 1 %
Eosinophils Absolute: 0.2 10*3/uL (ref 0.0–0.5)
Eosinophils Relative: 2 %
Immature Granulocytes: 0 %
Lymphocytes Relative: 20 %
Lymphs Abs: 1.6 10*3/uL (ref 0.7–4.0)
Monocytes Absolute: 0.8 10*3/uL (ref 0.1–1.0)
Monocytes Relative: 10 %
Neutro Abs: 5.6 10*3/uL (ref 1.7–7.7)
Neutrophils Relative %: 67 %

## 2021-03-25 LAB — RESP PANEL BY RT-PCR (FLU A&B, COVID) ARPGX2
Influenza A by PCR: NEGATIVE
Influenza B by PCR: NEGATIVE
SARS Coronavirus 2 by RT PCR: NEGATIVE

## 2021-03-25 LAB — PROTIME-INR
INR: 1.1 (ref 0.8–1.2)
Prothrombin Time: 14 seconds (ref 11.4–15.2)

## 2021-03-25 LAB — APTT: aPTT: 32 seconds (ref 24–36)

## 2021-03-25 IMAGING — MR MR MRA HEAD W/O CM
2 series · 19 of 48 positions shown · non-contrast
Comparison: No pertinent prior exam.

CLINICAL DATA: Neuro deficit, acute, stroke suspected; Stroke,
follow up

EXAM:
MRI HEAD WITHOUT CONTRAST
MRA HEAD WITHOUT CONTRAST
TECHNIQUE: Multiplanar, multi-echo pulse sequences of the brain and surrounding
structures were acquired without intravenous contrast. Angiographic
images of the Circle of Willis were acquired using MRA technique
without intravenous contrast.

[Series 2: ax (id) · axial · 1.0mm · 0.43mm/px · z∈[-73,+14]mm · 18 of 183 slices shown]
[im 1/183]
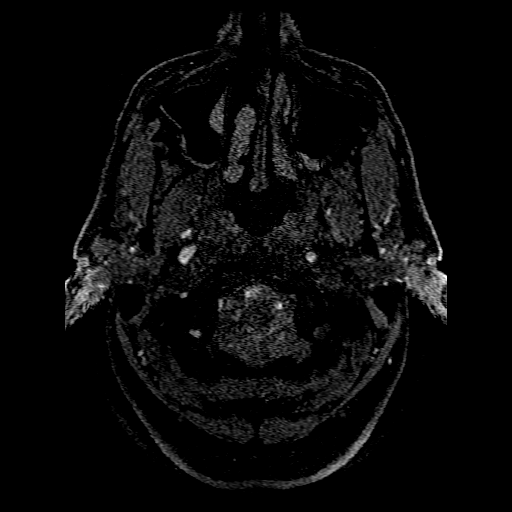
[im 4/183]
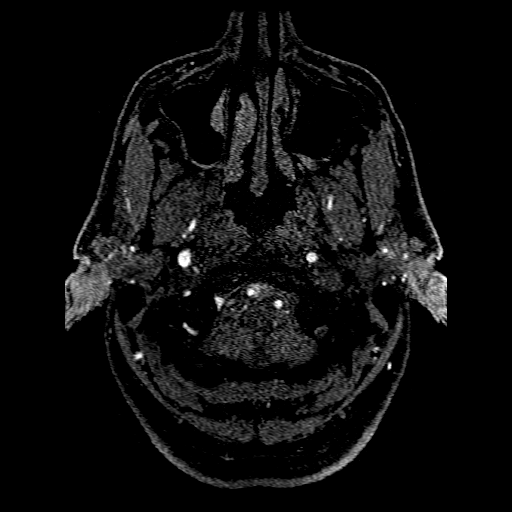
[im 8/183]
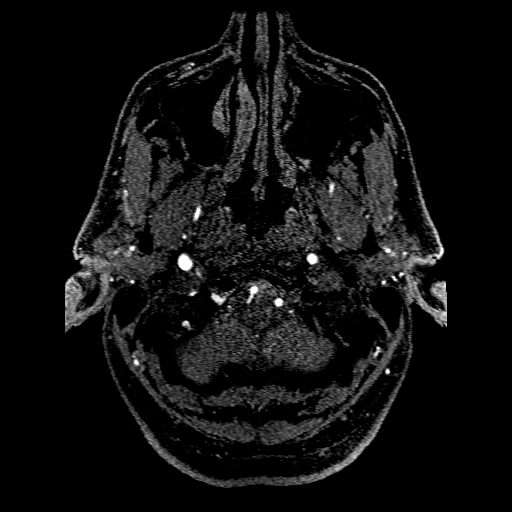
[im 12/183]
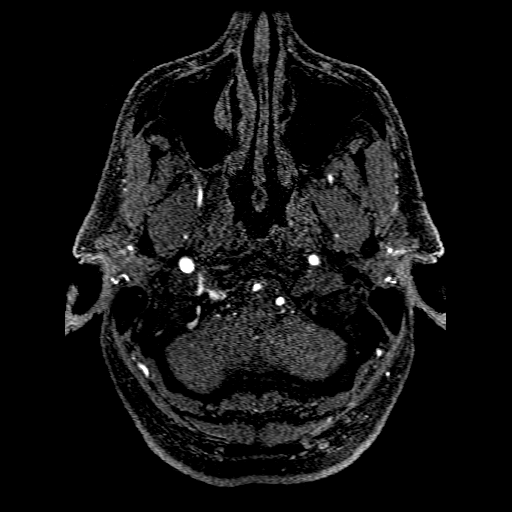
[im 16/183]
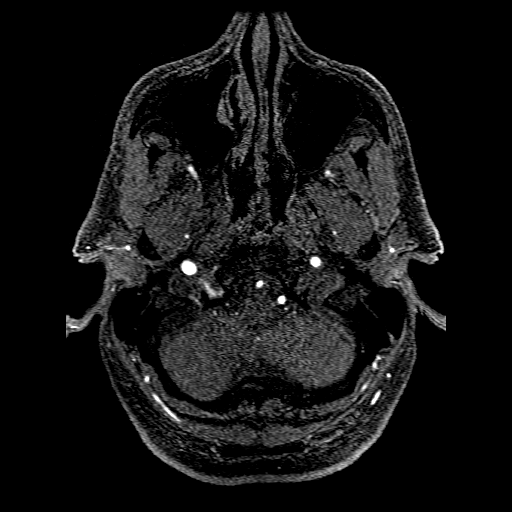
[im 20/183]
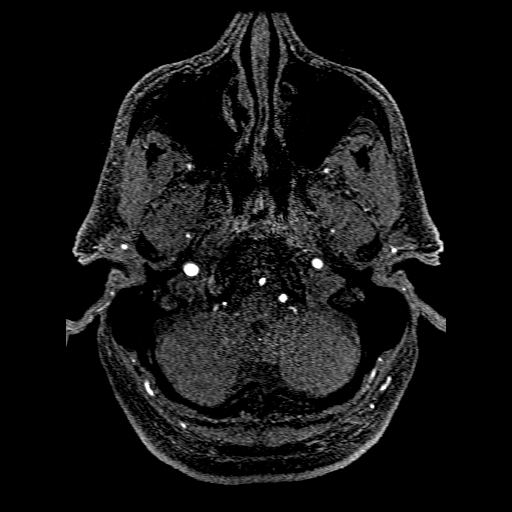
[im 24/183]
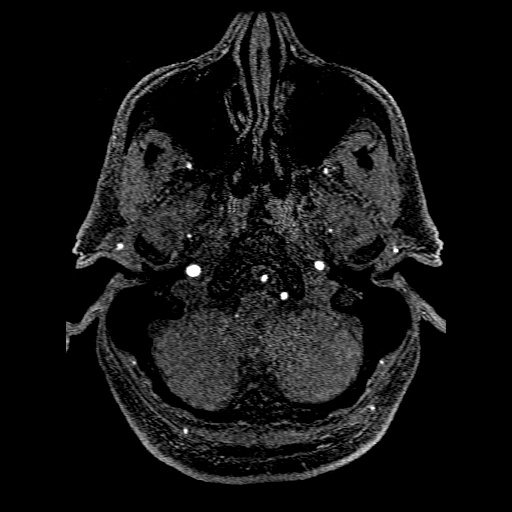
[im 28/183]
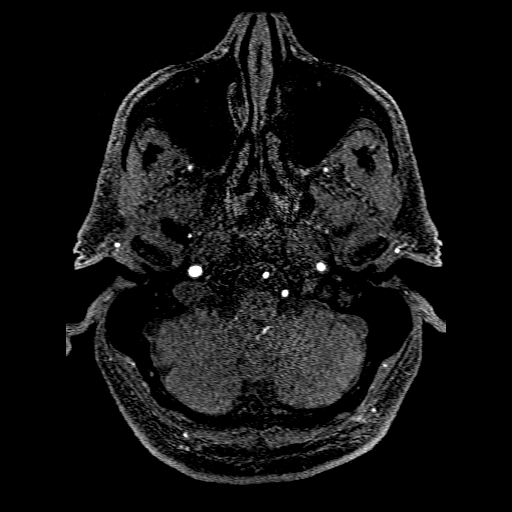
[im 32/183]
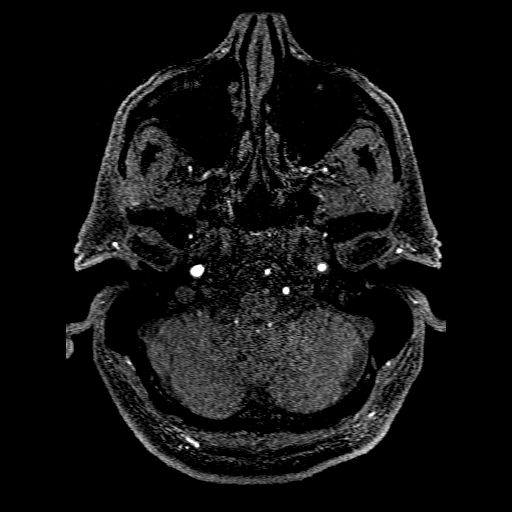
[im 36/183]
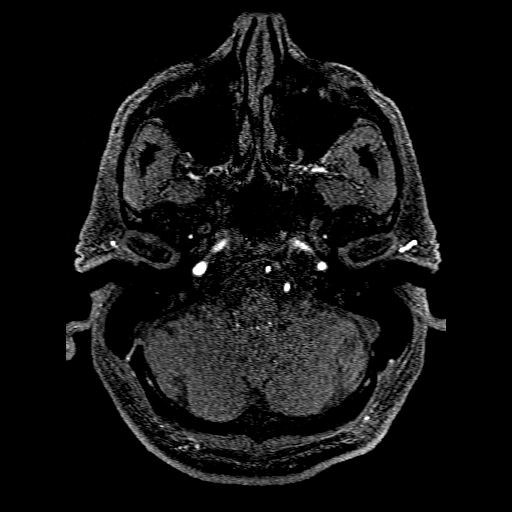
[im 56/183]
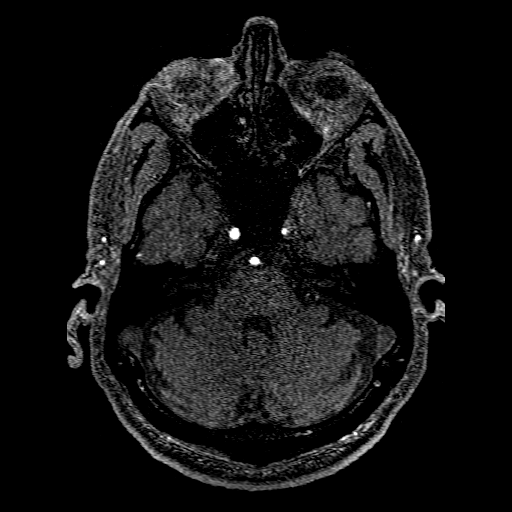
[im 80/183]
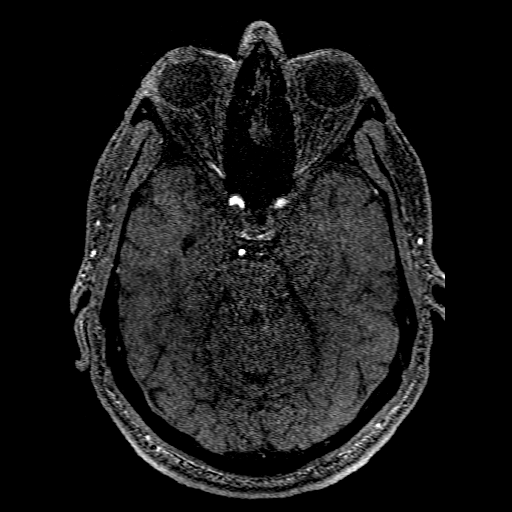
[im 92/183]
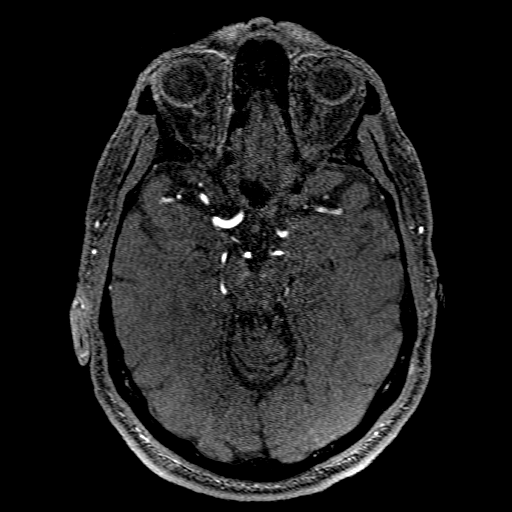
[im 103/183]
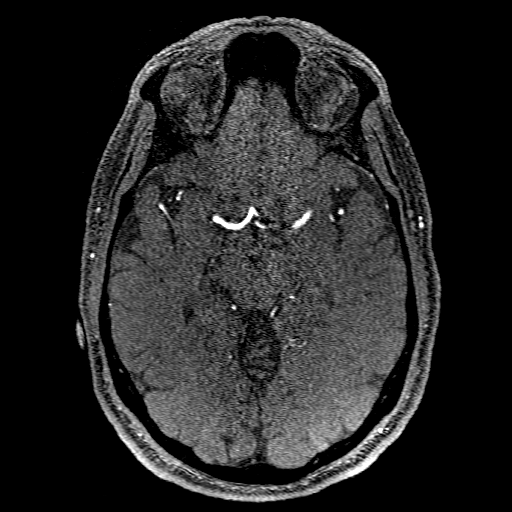
[im 127/183]
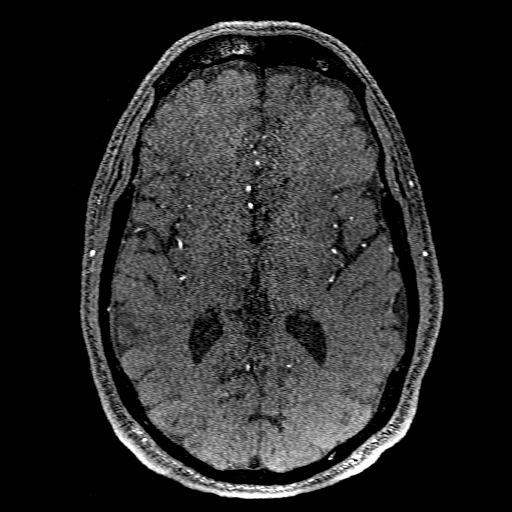
[im 151/183]
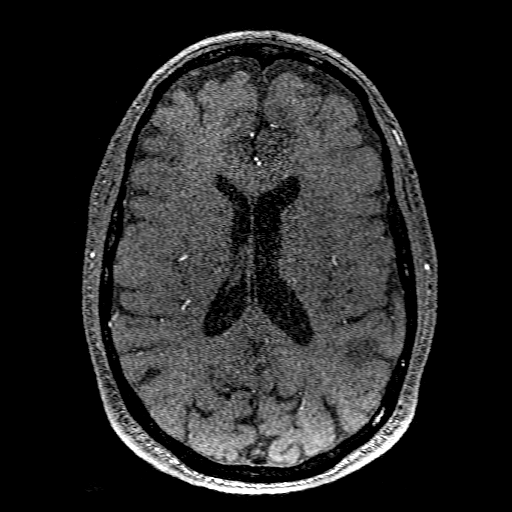
[im 155/183]
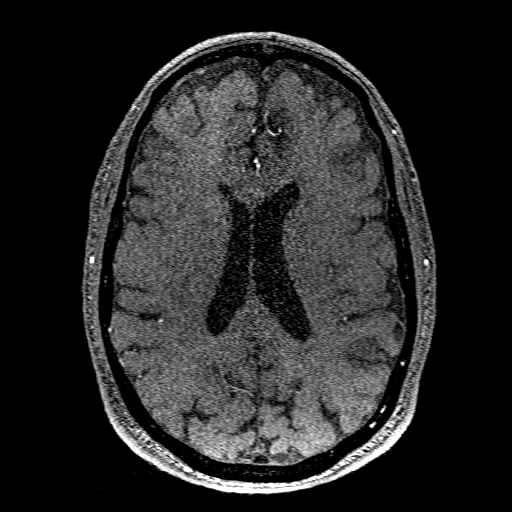
[im 175/183]
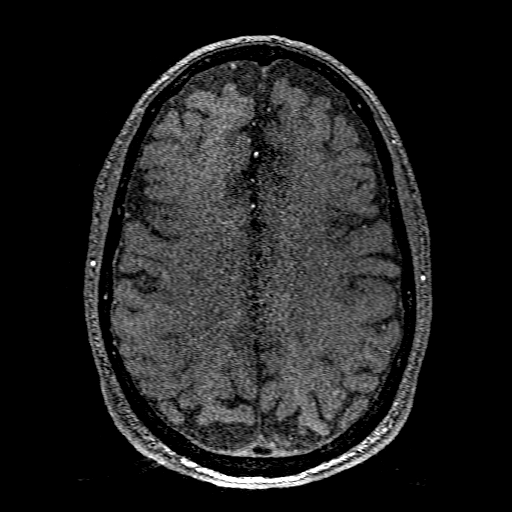

[Series 201: pjn:ax (id) · sagittal · 1.0mm · 0.43mm/px · 1 of 4 slices shown]
[im 1/4]
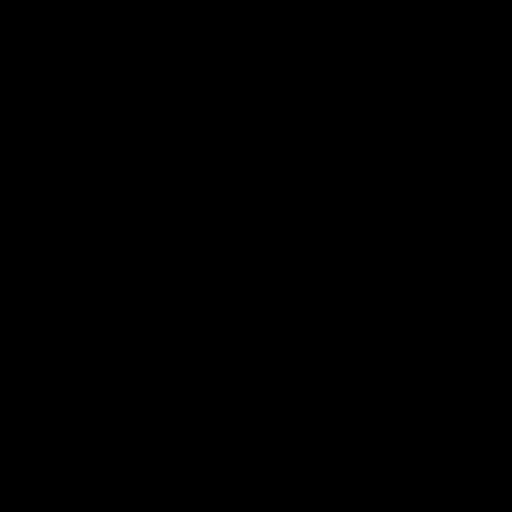

[19 of 48 positions shown; findings below may reference images not displayed]

FINDINGS: MRI HEAD FINDINGS

Brain: Multifocal abnormal diffusion restriction posterior left MCA
territory. No acute or chronic hemorrhage. Mild edema in the left
parietal lobe at one of the larger infarct sites. Mild generalized
volume loss. The midline structures are normal.

Vascular: Major flow voids are preserved.

Skull and upper cervical spine: Normal calvarium and skull base.
Visualized upper cervical spine and soft tissues are normal.

Sinuses/Orbits:No paranasal sinus fluid levels or advanced mucosal
thickening. No mastoid or middle ear effusion. Normal orbits.

MRA HEAD FINDINGS

POSTERIOR CIRCULATION:

--Vertebral arteries: Normal

--Inferior cerebellar arteries: Normal.

--Basilar artery: Normal.

--Superior cerebellar arteries: Normal.

--Posterior cerebral arteries: Normal.

ANTERIOR CIRCULATION:

--Intracranial internal carotid arteries: Normal.

--Anterior cerebral arteries (ACA): Normal.

--Middle cerebral arteries (MCA): Normal.

ANATOMIC VARIANTS: None
IMPRESSION: 1. Multifocal acute infarcts within the posterior left MCA
territory. No hemorrhage or mass effect.
2. Normal intracranial MRA.

## 2021-03-25 IMAGING — CT CT HEAD W/O CM
3 of 4 series · 13 of 47 positions shown, 15 images · non-contrast
Comparison: None.

CLINICAL DATA: Possible TIA

EXAM:
CT HEAD WITHOUT CONTRAST
TECHNIQUE: Contiguous axial images were obtained from the base of the skull
through the vertex without intravenous contrast.

[Series 3: head without · axial · non-contrast · 0.48mm/px · z∈[-164,-44]mm · 7 of 33 slices shown, 9 images]
[im 5/33  brain]
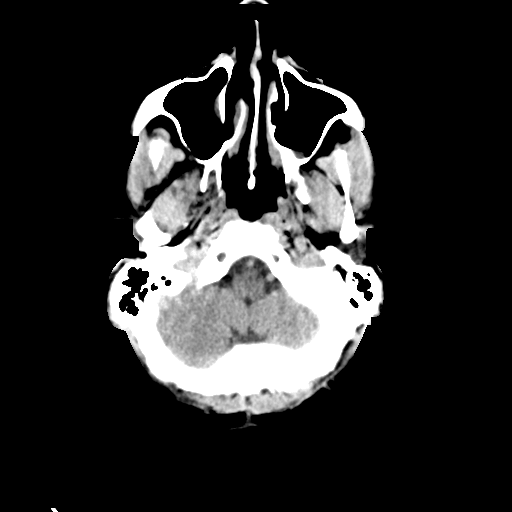
[im 5/33  bone]
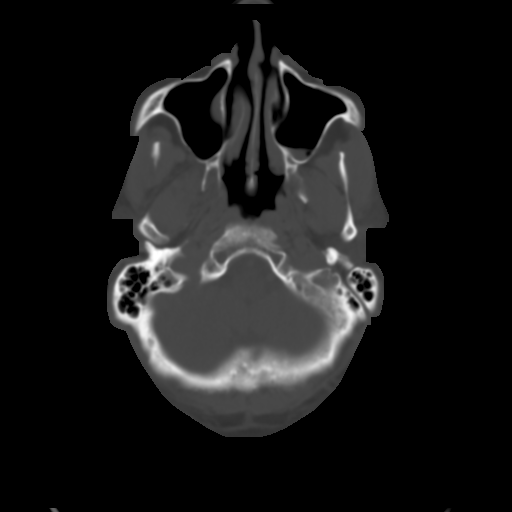
[im 9/33  brain]
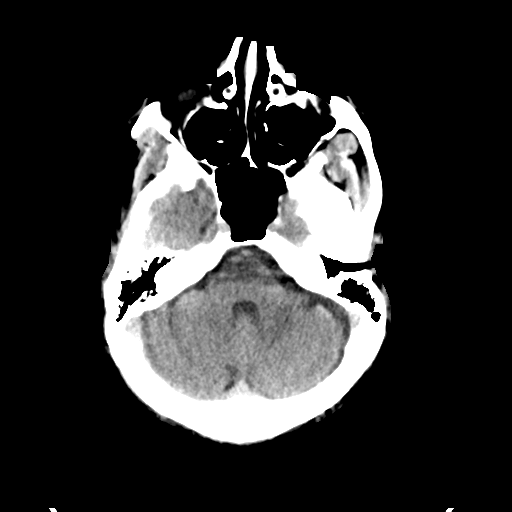
[im 13/33  brain]
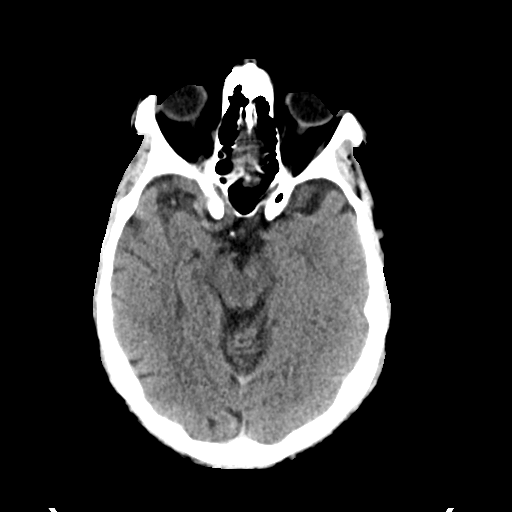
[im 17/33  brain]
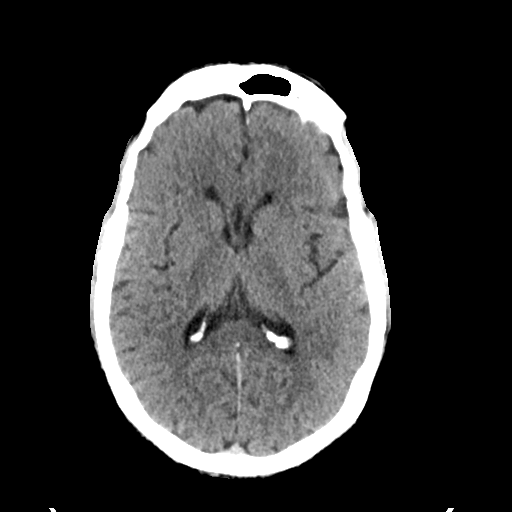
[im 21/33  brain]
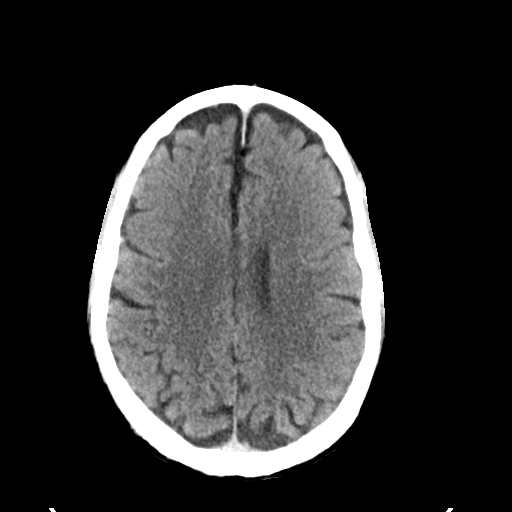
[im 21/33  bone]
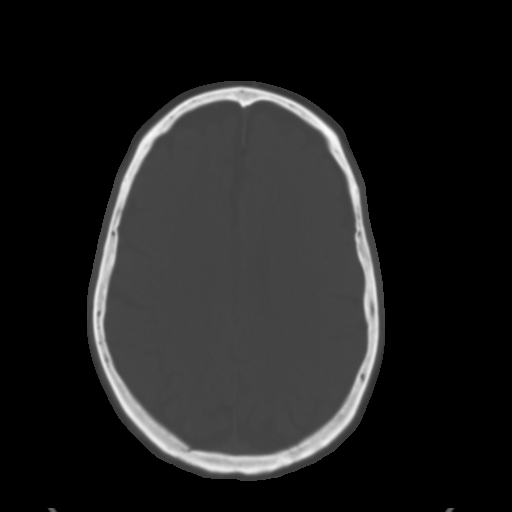
[im 25/33  brain]
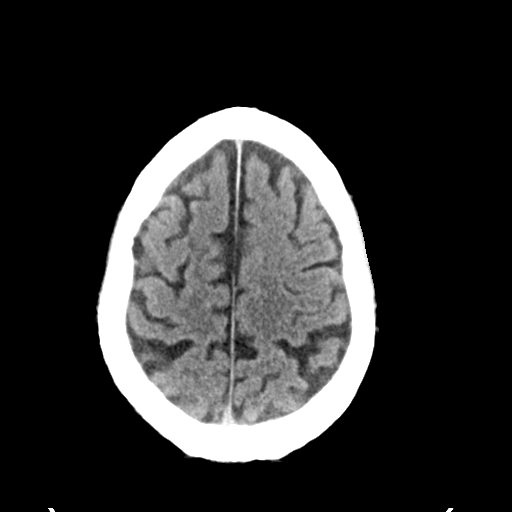
[im 29/33  brain]
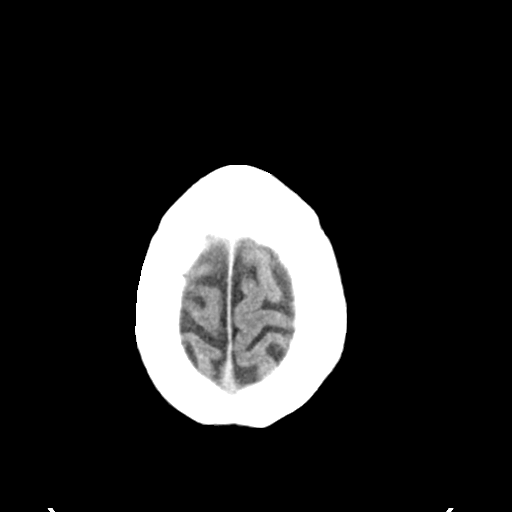

[Series 5: head without cor · coronal · non-contrast · 0.32mm/px · 3 of 70 slices shown]
[im 24/70  brain]
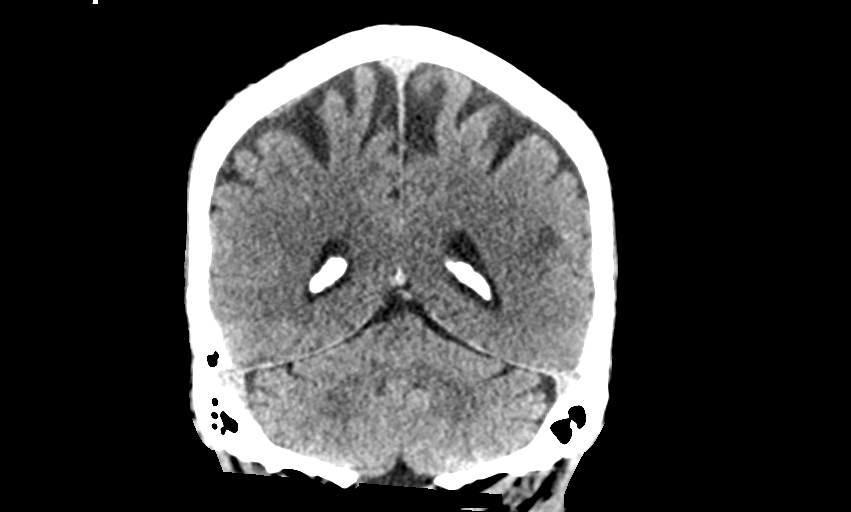
[im 31/70  brain]
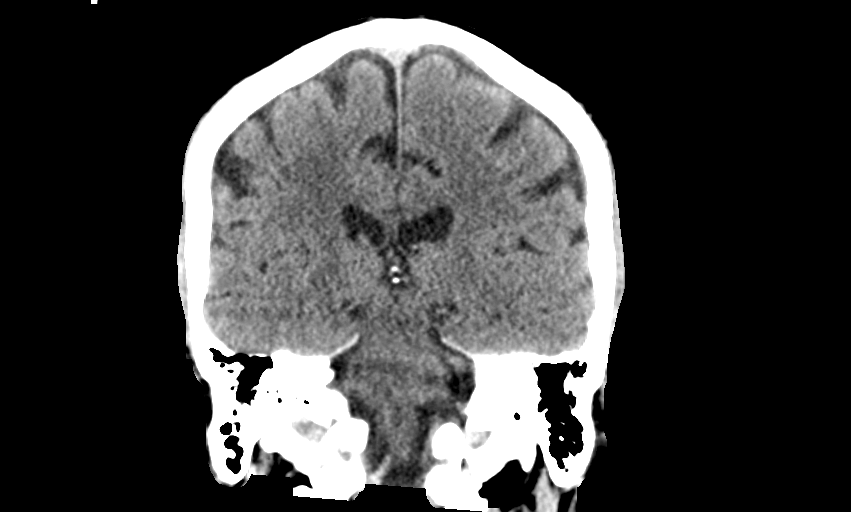
[im 39/70  brain]
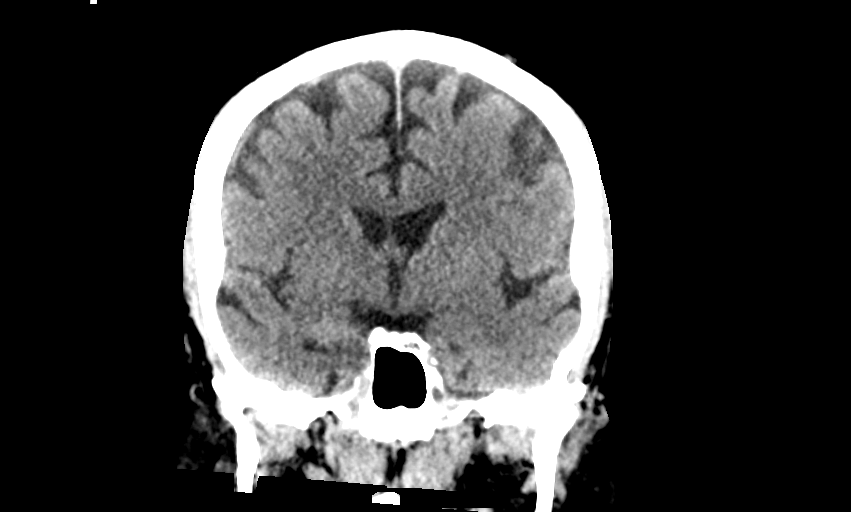

[Series 6: head without sag · sagittal · non-contrast · 0.32mm/px · 3 of 53 slices shown]
[im 18/53  brain]
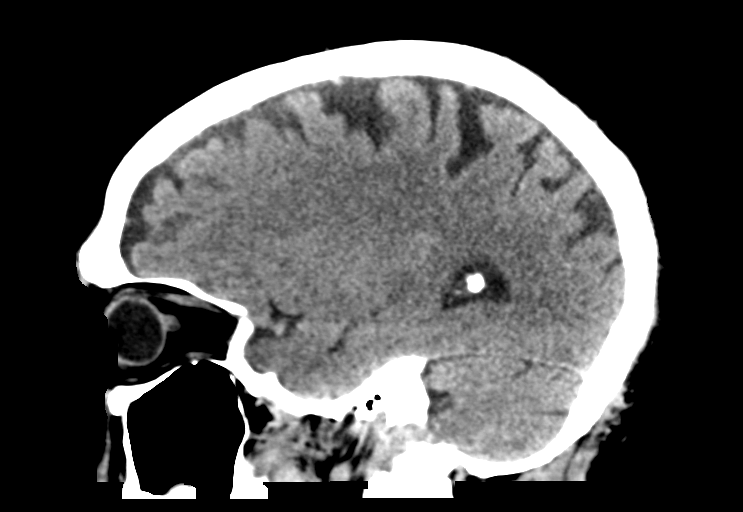
[im 27/53  brain]
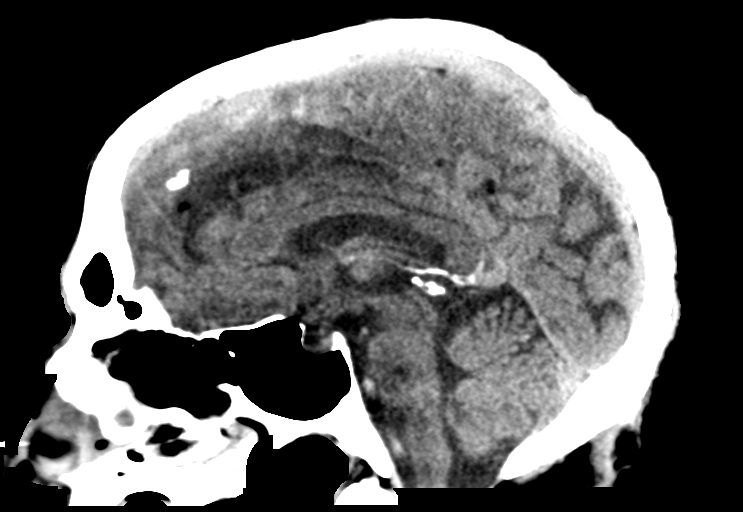
[im 35/53  brain]
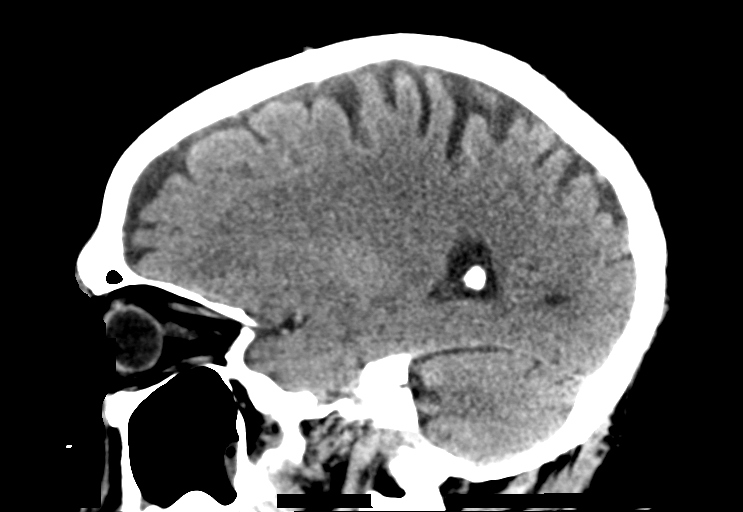

[13 of 47 positions shown; findings below may reference images not displayed]

FINDINGS: Brain: No hemorrhage is visualized. Rounded area of hypodensity at
the left temporoparietal junction consistent with edema. No midline
shift or significant mass effect. Mild atrophy. Nonenlarged
ventricles.

Vascular: No hyperdense vessels.  Carotid vascular calcification

Skull: Normal. Negative for fracture or focal lesion.

Sinuses/Orbits: Mucosal thickening in the sinuses

Other: None
IMPRESSION: 1. Focal hypodensity at the left temporoparietal junction consistent
with an area of edema, probably due to small acute or subacute
infarct. No definite hemorrhage at this time.
2. Atrophy

## 2021-03-25 IMAGING — MR MR HEAD W/O CM
6 of 11 series · 24 of 48 positions shown · non-contrast
Comparison: No pertinent prior exam.

CLINICAL DATA: Neuro deficit, acute, stroke suspected; Stroke,
follow up

EXAM:
MRI HEAD WITHOUT CONTRAST
MRA HEAD WITHOUT CONTRAST
TECHNIQUE: Multiplanar, multi-echo pulse sequences of the brain and surrounding
structures were acquired without intravenous contrast. Angiographic
images of the Circle of Willis were acquired using MRA technique
without intravenous contrast.

[Series 2: DWI · axial · 3.0mm · 0.94mm/px · z∈[-89,+72]mm · 7 of 110 slices shown (1 of 2)]
[im 1/110]
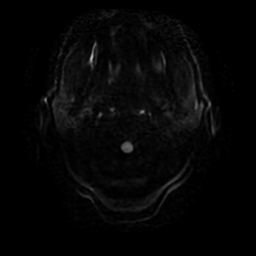
[im 19/110]
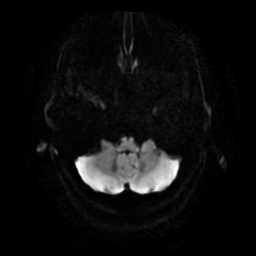
[im 37/110]
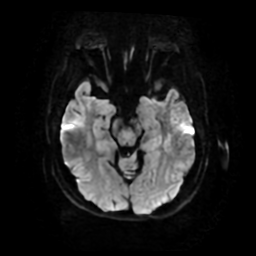
[im 55/110]
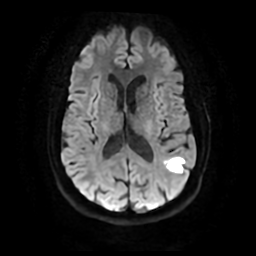
[im 73/110]
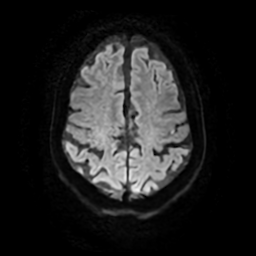
[im 91/110]
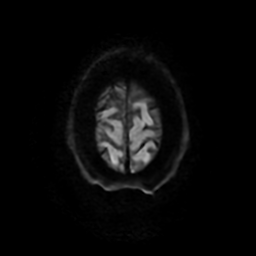
[im 110/110]
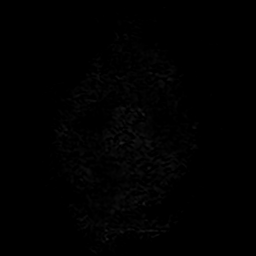

[Series 3: DWI · coronal · 4.0mm · 0.94mm/px · 5 of 77 slices shown (2 of 2)]
[im 1/77]
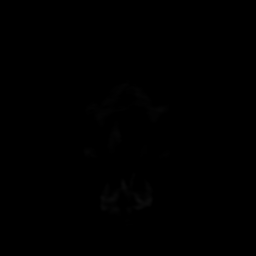
[im 20/77]
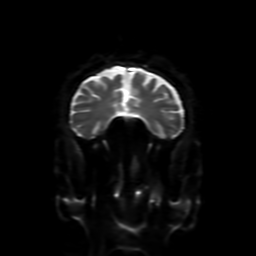
[im 39/77]
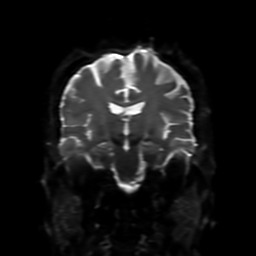
[im 58/77]
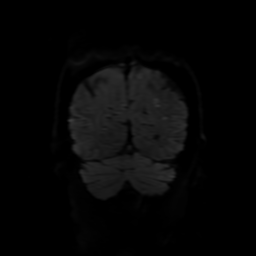
[im 77/77]
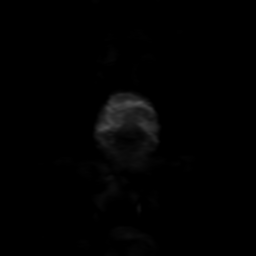

[Series 4: FLAIR · sagittal · 5.0mm · 0.23mm/px · 2 of 27 slices shown (1 of 2)]
[im 1/27]
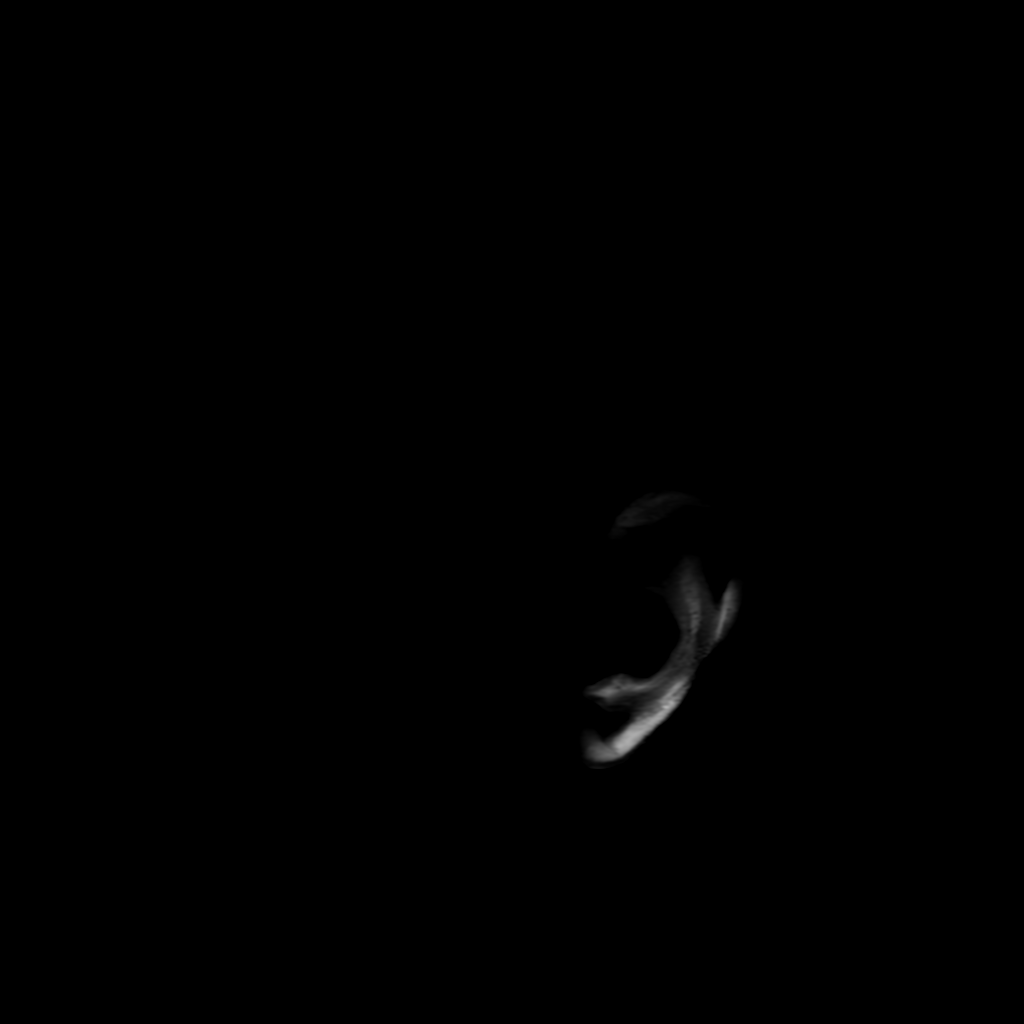
[im 27/27]
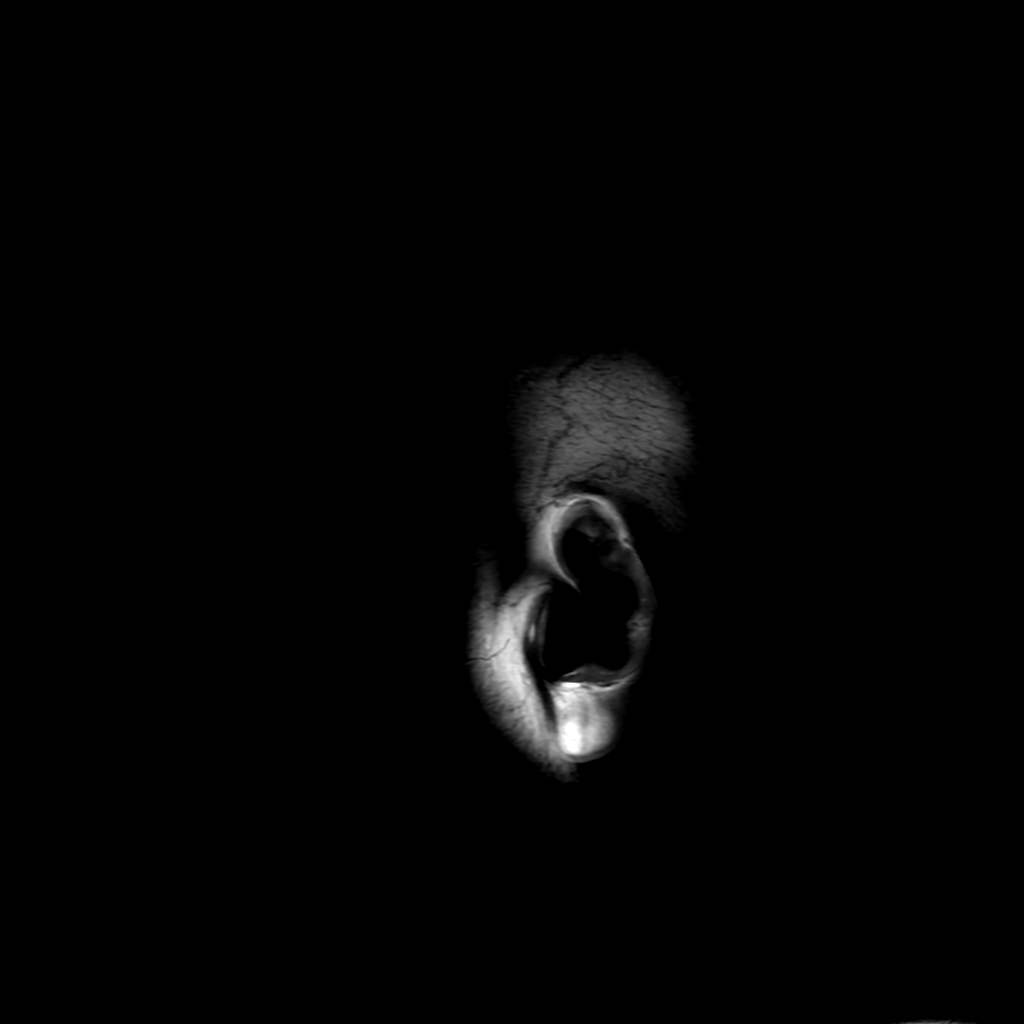

[Series 6: FLAIR · axial · 4.0mm · 0.45mm/px · z∈[-80,+74]mm · 3 of 36 slices shown (2 of 2)]
[im 1/36]
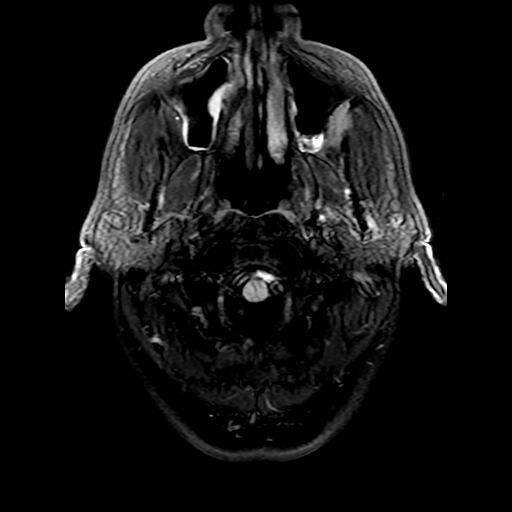
[im 18/36]
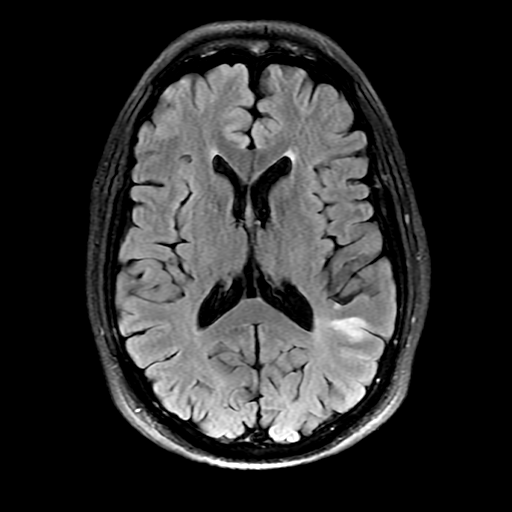
[im 36/36]
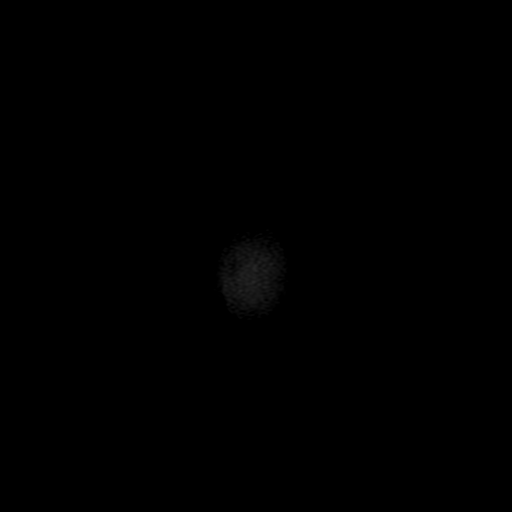

[Series 250: ADC · axial · 3.0mm · 0.94mm/px · z∈[-89,+72]mm · 4 of 55 slices shown (1 of 2)]
[im 1/55]
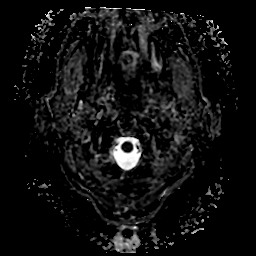
[im 19/55]
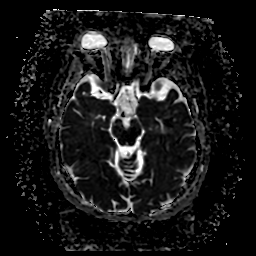
[im 37/55]
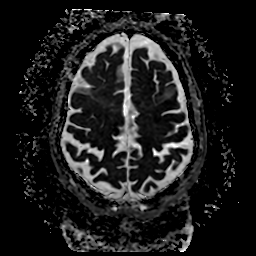
[im 55/55]
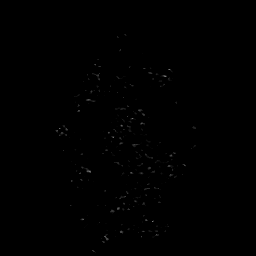

[Series 350: ADC · coronal · 4.0mm · 0.94mm/px · 3 of 39 slices shown (2 of 2)]
[im 1/39]
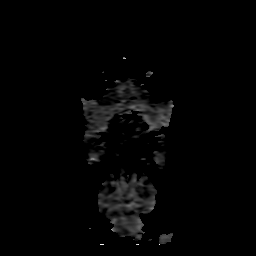
[im 20/39]
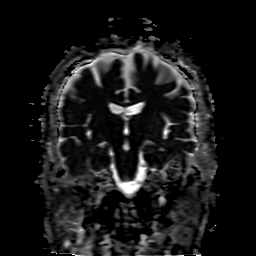
[im 39/39]
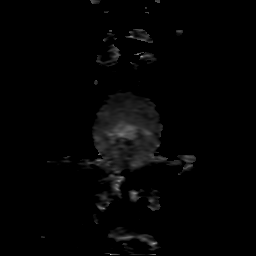

[24 of 48 positions shown; findings below may reference images not displayed]

FINDINGS: MRI HEAD FINDINGS

Brain: Multifocal abnormal diffusion restriction posterior left MCA
territory. No acute or chronic hemorrhage. Mild edema in the left
parietal lobe at one of the larger infarct sites. Mild generalized
volume loss. The midline structures are normal.

Vascular: Major flow voids are preserved.

Skull and upper cervical spine: Normal calvarium and skull base.
Visualized upper cervical spine and soft tissues are normal.

Sinuses/Orbits:No paranasal sinus fluid levels or advanced mucosal
thickening. No mastoid or middle ear effusion. Normal orbits.

MRA HEAD FINDINGS

POSTERIOR CIRCULATION:

--Vertebral arteries: Normal

--Inferior cerebellar arteries: Normal.

--Basilar artery: Normal.

--Superior cerebellar arteries: Normal.

--Posterior cerebral arteries: Normal.

ANTERIOR CIRCULATION:

--Intracranial internal carotid arteries: Normal.

--Anterior cerebral arteries (ACA): Normal.

--Middle cerebral arteries (MCA): Normal.

ANATOMIC VARIANTS: None
IMPRESSION: 1. Multifocal acute infarcts within the posterior left MCA
territory. No hemorrhage or mass effect.
2. Normal intracranial MRA.

## 2021-03-25 MED ORDER — ATORVASTATIN CALCIUM 10 MG PO TABS
10.0000 mg | ORAL_TABLET | Freq: Every morning | ORAL | Status: DC
  Administered 2021-03-26 – 2021-03-29 (×3): 10 mg via ORAL
  Filled 2021-03-25 (×3): qty 1

## 2021-03-25 MED ORDER — ACETAMINOPHEN 160 MG/5ML PO SOLN: 650.0000 mg | ORAL | Status: DC | PRN

## 2021-03-25 MED ORDER — CLOPIDOGREL BISULFATE 75 MG PO TABS
75.0000 mg | ORAL_TABLET | Freq: Every day | ORAL | Status: DC
  Administered 2021-03-25 – 2021-03-26 (×2): 75 mg via ORAL
  Filled 2021-03-25 (×2): qty 1

## 2021-03-25 MED ORDER — STROKE: EARLY STAGES OF RECOVERY BOOK
Freq: Once | Status: AC
  Filled 2021-03-25: qty 1

## 2021-03-25 MED ORDER — SODIUM CHLORIDE 0.9 % IV SOLN: INTRAVENOUS | Status: AC

## 2021-03-25 MED ORDER — SENNOSIDES-DOCUSATE SODIUM 8.6-50 MG PO TABS: 1.0000 | ORAL_TABLET | Freq: Every evening | ORAL | Status: DC | PRN

## 2021-03-25 MED ORDER — ASPIRIN EC 81 MG PO TBEC
81.0000 mg | DELAYED_RELEASE_TABLET | Freq: Every morning | ORAL | Status: DC
  Administered 2021-03-26: 81 mg via ORAL
  Filled 2021-03-25: qty 1

## 2021-03-25 MED ORDER — ACETAMINOPHEN 650 MG RE SUPP: 650.0000 mg | RECTAL | Status: DC | PRN

## 2021-03-25 MED ORDER — ENOXAPARIN SODIUM 40 MG/0.4ML IJ SOSY
40.0000 mg | PREFILLED_SYRINGE | INTRAMUSCULAR | Status: DC
  Administered 2021-03-25 – 2021-03-26 (×3): 40 mg via SUBCUTANEOUS
  Filled 2021-03-25 (×3): qty 0.4

## 2021-03-25 MED ORDER — ACETAMINOPHEN 325 MG PO TABS: 650.0000 mg | ORAL_TABLET | ORAL | Status: DC | PRN

## 2021-03-25 NOTE — Telephone Encounter (Signed)
Pt and his wife called from the [facility] ED to report that around noon today he woke up form a nap and he was having difficulty speaking and the left side of his mouth was numb and he looked in the mirror and his left side of his mouth appeared to be "drooping"... he says it lasted 15 minutes and it then all improved. He has not experienced these symptoms before and he is not in the ED and just had a CT and waiting for the results.  I advised him that if he needs [place] to let us know and I will forward to Dr. Pernell Dupre for review.   Pt has planned OP Cath this week 03/27/21.

## 2021-03-25 NOTE — Consult Note (Signed)
NEUROLOGY CONSULTATION NOTE   Date of service: March 25, 2021 Patient Name: Stephen Mullins MRN:  [id number] DOB:  Dec 17, 1945 Reason for consult: "transient R sided numbness and R facial droop with spotaneous resolution" Requesting Provider: Melene Plan, DO _ _ _   _ __   _ __ _ _  __ __   _ __   __ _  History of Present Illness  Stephen Mullins is a 75 y.o. male with PMH significant for hyperlipidemia, diabetes, hypertension, GERD, aortic valve replacement who presented with transient right facial numbness and facial droop.  Patient just finished working in the yard and the went in to take a nap at 1230 on 03/25/2021.  He woke up at 1330 with slurred speech, right facial droop and right-sided numbness.  This lasted about 10 minutes and went away.  No prior history of strokes, grandmother had strokes.  Diabetes has been controlled with diet and reports that his last hemoglobin A1c was 6.1.  He also reports that he has been worked up by his cardiologist and just had a treadmill stress test done and they are planning on a cardiac cath later this week.  Stephen Mullins: 0 tPA/thrombectomy: no symptoms. NIHSS components Score: Comment  1a Level of Conscious 0[x]  1[]  2[]  3[]      1b LOC Questions 0[x]  1[]  2[]       1c LOC Commands 0[x]  1[]  2[]       2 Best Gaze 0[x]  1[]  2[]       3 Visual 0[x]  1[]  2[]  3[]      4 Facial Palsy 0[x]  1[]  2[]  3[]      5a Motor Arm - left 0[x]  1[]  2[]  3[]  4[]  UN[]    5b Motor Arm - Right 0[x]  1[]  2[]  3[]  4[]  UN[]    6a Motor Leg - Left 0[x]  1[]  2[]  3[]  4[]  UN[]    6b Motor Leg - Right 0[x]  1[]  2[]  3[]  4[]  UN[]    7 Limb Ataxia 0[x]  1[]  2[]  3[]  UN[]     8 Sensory 0[x]  1[]  2[]  UN[]      9 Best Language 0[x]  1[]  2[]  3[]      10 Dysarthria 0[x]  1[]  2[]  UN[]      11 Extinct. and Inattention 0[x]  1[]  2[]       TOTAL: 0       ROS   Constitutional Denies weight loss, fever and chills.   HEENT Denies changes in vision and hearing.   Respiratory Denies SOB and cough.   CV Denies palpitations  and CP   GI Denies abdominal pain, nausea, vomiting and diarrhea.   GU Denies dysuria and urinary frequency.   MSK Denies myalgia and joint pain.   Skin Denies rash and pruritus.   Neurological Denies headache and syncope.   Psychiatric Denies recent changes in mood. Denies anxiety and depression.    Past History   Past Medical History:  Diagnosis Date   Bicuspid aortic valve    with aortic stenosis and mild insufficiency, mild to moderate aortic root dilitation   Borderline hyperlipidemia    Diabetes mellitus without complication (HCC)    Elevated fasting glucose    Elevated PSA    due to prostatitis   GERD (gastroesophageal reflux disease)    Heart murmur    Hypertension    Nail fungus    Thoracic ascending aortic aneurysm [facility])    Past Surgical History:  Procedure Laterality Date   AORTIC VALVE REPLACEMENT N/A 12/20/2017   Procedure: AORTIC VALVE REPLACEMENT (AVR);  Surgeon: Kerin Perna, MD;  Location:  [facility] OR;  Service: Open Heart Surgery;  Laterality: N/A;   CATARACT EXTRACTION, BILATERAL     RIGHT/LEFT HEART CATH AND CORONARY ANGIOGRAPHY N/A 11/09/2017   Procedure: RIGHT/LEFT HEART CATH AND CORONARY ANGIOGRAPHY;  Surgeon: [name], MD;  Location: [facility] INVASIVE CV LAB;  Service: Cardiovascular;  Laterality: N/A;   TEE WITHOUT CARDIOVERSION N/A 12/20/2017   Procedure: TRANSESOPHAGEAL ECHOCARDIOGRAM (TEE);  Surgeon: [name], [name], MD;  Location: [facility] OR;  Service: Open Heart Surgery;  Laterality: N/A;   Family History  Problem Relation Age of Onset   Prostate cancer Father    Social History   Socioeconomic History   Marital status: Married    Spouse name: Not on file   Number of children: Not on file   Years of education: Not on file   Highest education level: Not on file  Occupational History   Occupation: retired  Tobacco Use   Smoking status: Former    Types: Cigarettes    Quit date: 10/20/2001    Years since quitting: 19.4   Smokeless tobacco:  Never  Vaping Use   Vaping Use: Never used  Substance and Sexual Activity   Alcohol use: Yes    Alcohol/week: 1.0 - 2.0 standard drink    Types: 1 - 2 Glasses of wine per week   Drug use: No   Sexual activity: Not on file  Other Topics Concern   Not on file  Social History Narrative   Not on file   Social Determinants of Health   Financial Resource Strain: Not on file  Food Insecurity: Not on file  Transportation Needs: Not on file  Physical Activity: Not on file  Stress: Not on file  Social Connections: Not on file   Allergies  Allergen Reactions   Tetanus Toxoid, Adsorbed Other (See Comments)    Pt. States as small child he had tetnus made from horse serum and he turned 'blue' ?? SHORTNESS OF BREATH ??    Medications  (Not in a hospital admission)    Vitals   Vitals:   03/25/21 1519 03/25/21 1521 03/25/21 1701 03/25/21 1828  BP: (!) 179/93  130/71 (!) 175/109  Pulse: 65  62 66  Resp: 18  18 10   Temp: 98.3 F (36.8 C)  99 F (37.2 C)   TempSrc: Oral  Oral   SpO2: 95%  97% 98%  Weight:  88.5 kg    Height:  5\' 11"  (1.803 m)       Body mass index is 27.2 kg/m.  Physical Exam   General: Laying comfortably in bed; in no acute distress.  HENT: Normal oropharynx and mucosa. Normal external appearance of ears and nose. Neck: Supple, no pain or tenderness  CV: No JVD. No peripheral edema.  Pulmonary: Symmetric Chest rise. Normal respiratory effort.  Abdomen: Soft to touch, non-tender.  Ext: No cyanosis, edema, or deformity  Skin: No rash. Normal palpation of skin.   Musculoskeletal: Normal digits and nails by inspection. No clubbing.   Neurologic Examination  Mental status/Cognition: Alert, oriented to self, place, month and year, good attention. Speech/language: Fluent, comprehension intact, object naming intact, repetition intact.  Cranial nerves:   CN II Pupils equal and reactive to light, no VF deficits    CN III,IV,VI EOM intact, no gaze preference  or deviation, no nystagmus    CN V normal sensation in V1, V2, and V3 segments bilaterally    CN VII no asymmetry, no nasolabial fold flattening    CN VIII  normal hearing to speech    CN IX & X normal palatal elevation, no uvular deviation    CN XI 5/5 head turn and 5/5 shoulder shrug bilaterally   CN XII midline tongue protrusion    Motor:  Muscle bulk: normal , tone normal, pronator drift: none  tremor: None Mvmt Root Nerve  Muscle Right Left Comments  SA C5/6 Ax Deltoid 5 5   EF C5/6 Mc Biceps 5 5   EE C6/7/8 Rad Triceps 5 5   WF C6/7 Med FCR     WE C7/8 PIN ECU     F Ab C8/T1 U ADM/FDI 5 5   HF L1/2/3 Fem Illopsoas 5 5   KE L2/3/4 Fem Quad 5 5   DF L4/5 D Peron Tib Ant 5 5   PF S1/2 Tibial Grc/Sol 5 5    Reflexes:  Right Left Comments  Pectoralis      Biceps (C5/6) 2 2   Brachioradialis (C5/6) 2 2    Triceps (C6/7) 2 2    Patellar (L3/4) 2 2    Achilles (S1)      Hoffman      Plantar     Jaw jerk    Sensation:  Light touch intact   Pin prick    Temperature    Vibration   Proprioception    Coordination/Complex Motor:  - Finger to Nose intact BL - Heel to shin intact BL - Rapid alternating movement are normal - Gait: Deferred.  Labs   CBC:  Recent Labs  Lab 03/24/21 1037 03/25/21 1536 03/25/21 1632  WBC 6.9 8.3  --   NEUTROABS  --  5.6  --   HGB 15.4 15.4 15.6  HCT 45.9 46.5 46.0  MCV 89 89.8  --   PLT 239 250  --     Basic Metabolic Panel:  Lab Results  Component Value Date   NA 141 03/25/2021   K 4.1 03/25/2021   CO2 28 03/25/2021   GLUCOSE 108 (H) 03/25/2021   BUN 19 03/25/2021   CREATININE 1.00 03/25/2021   CALCIUM 10.0 03/25/2021   GFRNONAA >60 03/25/2021   GFRAA 81 01/10/2018   Lipid Panel: No results found for: LDLCALC HgbA1c:  Lab Results  Component Value Date   HGBA1C 5.8 (H) 12/16/2017   Urine Drug Screen: No results found for: LABOPIA, COCAINSCRNUR, LABBENZ, AMPHETMU, THCU, LABBARB  Alcohol Level No results found for:  [facility]  CT Head without contrast(personally reviewed): 1. Focal hypodensity at the left temporoparietal junction consistent with an area of edema, probably due to small acute or subacute infarct. No definite hemorrhage at this time. 2. Atrophy  MR Angio head without contrast and Carotid Duplex BL: pending  MRI Brain: pending Impression   Stephen Mullins is a 75 y.o. male with PMH significant for diabetes, hypertension, hyperlipidemia, aortic valve replacement in 2019 Who presented with a transient episode of right facial numbness and right facial droop with spontaneous resolution in 10 minutes. Work-up here with a CT head without contrast with a focal hypodensity in the left temporoparietal junction, likely an acute/subacute stroke.  Primary Diagnosis:  Cerebral infarction, unspecified.  Secondary Diagnosis: Essential (primary) hypertension and Type 2 diabetes mellitus w/o complications  Recommendations  Plan:  - Frequent Neuro checks per stroke unit protocol - Recommend brain imaging with MRI Brain without contrast - Recommend Vascular imaging with MRA Angio Head without contrast and US Carotid doppler - Recommend obtaining TTE - Recommend obtaining Lipid panel with LDL -  Please start statin if LDL > 70 - Recommend HbA1c - Antithrombotic - aspirin 81mg  daily along with plavix 75mg  daily x 21 days, followed by aspirin 81mg  daily alone. - Recommend DVT ppx - SBP goal - permissive hypertension first 24 h < 220/110. Held home meds.  - Recommend Telemetry monitoring for arrythmia - Recommend bedside swallow screen prior to PO intake. - Stroke education booklet - Recommend PT/OT/SLP consult  ______________________________________________________________________   Thank you for the opportunity to take part in the care of this patient. If you have any further questions, please contact the neurology consultation attending.  Signed,  Erick BlinksSalman Lee [facility] Pager Number [phone] _ _ _   _ __   _ __ _ _  __ __   _ __   __ _

## 2021-03-25 NOTE — ED Provider Notes (Signed)
Emergency Medicine Provider Triage Evaluation Note  Stephen Mullins , a 75 y.o. male  was evaluated in triage.  Pt complains of who presents with concern for episode of slurred speech and right-sided facial droop.  Patient states he was working in his yard and laid down around 1230 (LKW) this afternoon feeling well.  He woke up at 1:30 PM and immediately noticed slurred speech, right-sided facial numbness and right-sided facial droop which completely resolved within 15 minutes of waking.  No history of similar symptoms in the past. Did recently have abnormal stress test 1 week ago.  Has heart catheterization scheduled for Thursday, 03/27/2021.  Feeling well and completely asymptomatic at this time.  Review of Systems  Positive: Facial droop, facial numbness, slurred speech, now resolved Negative: Chest pain, shortness of breath, palpitations, syncope, headache  Physical Exam  BP (!) 179/93   Pulse 65   Temp 98.3 F (36.8 C) (Oral)   Resp 18   Ht 5\' 11"  (1.803 m)   Wt 88.5 kg   SpO2 95%   BMI 27.20 kg/m  Gen:   Awake, no distress   Resp:  Normal effort  MSK:   Moves extremities without difficulty  Other:  No focal deficit on neurologic exam.  Cranial nerves are intact with exception of 1 which was not tested, symmetric strength and sensation, normal coordination.  Medical Decision Making  Medically screening exam initiated at 3:36 PM.  Appropriate orders placed.  was informed that the remainder of the evaluation will be completed by another provider, this initial triage assessment does not replace that evaluation, and the importance of remaining in the ED until their evaluation is complete.  Clinical concern for TIA given HPI with reassuring physical exam.  Code stroke not activated given no focal deficit on neurologic exam at this time, however we will proceed with stroke work-up.   This chart was dictated using voice recognition software, Dragon. Despite the best efforts  of this provider to proofread and correct errors, errors may still occur which can change documentation meaning.    [name] 03/25/21 1538    03/27/21, MD 03/27/21 1004

## 2021-03-25 NOTE — ED Triage Notes (Signed)
Pt here for stroke like symptoms. Pt went to take a nap after working in the yard. LKN 1230, woke up at 1330 w/ slurred speech, numbness on R side of face, and R facial droop. Symptoms have resolved.

## 2021-03-25 NOTE — ED Provider Notes (Signed)
[facility] EMERGENCY DEPARTMENT Provider Note   CSN: [id number] Arrival date & time: 03/25/21  1506     History Chief Complaint  Patient presents with   Transient Ischemic Attack    Stephen Mullins is a 75 y.o. male presenting for evaluation of right-sided facial numbness, facial droop, and speech deficits.  Patient states at 1230 he took a nap, and when he woke up at 130 he noticed slurred speech, right lower facial numbness, and right-sided facial droop.  Speech resolved within 10 minutes, and numbness and droop resolved within 15 minutes.  Since then, he has been symptom-free.  He reports no weakness or numbness of upper or lower extremities.  No dizziness or lightheadedness.  No chest pain, shortness of breath, nausea, vomiting, abdominal pain, urinary symptoms, abnormal bowel movements. Pt with h/o bicuspid valve s/p repair with recent abnormal stress test with plan for heart cath in 1 week. He reports a few episodes of a fib after the surgery, but this d/c'd after starting metoprolol. However he then developed bradycardia, and metoprolol was d/c'd. He is on 81 mg asa, no blood thinners.   Additional history obtained per chart review.  Patient with a history of by cuspid aortic valve status post replacement, hyperlipidemia, diabetes, GERD, hypertension, thoracic ascending aortic aneurysm.  HPI     Past Medical History:  Diagnosis Date   Bicuspid aortic valve    with aortic stenosis and mild insufficiency, mild to moderate aortic root dilitation   Borderline hyperlipidemia    Diabetes mellitus without complication (HCC)    Elevated fasting glucose    Elevated PSA    due to prostatitis   GERD (gastroesophageal reflux disease)    Heart murmur    Hypertension    Nail fungus    Thoracic ascending aortic aneurysm [facility])     Patient Active Problem List   Diagnosis Date Noted   Acute ischemic stroke (HCC) 03/25/2021   Encounter for follow-up for aortic valve  replacement 07/03/2020   S/P aortic valve replacement with bioprosthetic valve 12/20/2017   Plantar fasciitis 06/25/2017   Onychomycosis 04/27/2017   Pure hypercholesterolemia 10/25/2015   Essential (primary) hypertension 10/25/2015   Cardiac murmur 10/25/2015   Impaired fasting glucose 10/25/2015   Congenital insufficiency of aortic valve 10/25/2015   Bicuspid aortic valve 10/25/2015   Dilated aortic root (HCC) 10/25/2015   Aortic stenosis 10/25/2015   Aortic regurgitation 10/25/2015   Hyperlipidemia 10/25/2015   Family history of malignant neoplasm of prostate 09/20/2015   Encounter for screening for other disorder 09/20/2015    Past Surgical History:  Procedure Laterality Date   AORTIC VALVE REPLACEMENT N/A 12/20/2017   Procedure: AORTIC VALVE REPLACEMENT (AVR);  Surgeon: [name], [name], MD;  Location: [facility] OR;  Service: Open Heart Surgery;  Laterality: N/A;   CATARACT EXTRACTION, BILATERAL     RIGHT/LEFT HEART CATH AND CORONARY ANGIOGRAPHY N/A 11/09/2017   Procedure: RIGHT/LEFT HEART CATH AND CORONARY ANGIOGRAPHY;  Surgeon: [name], MD;  Location: [facility] INVASIVE CV LAB;  Service: Cardiovascular;  Laterality: N/A;   TEE WITHOUT CARDIOVERSION N/A 12/20/2017   Procedure: TRANSESOPHAGEAL ECHOCARDIOGRAM (TEE);  Surgeon: [name], [name], MD;  Location: [facility] OR;  Service: Open Heart Surgery;  Laterality: N/A;       Family History  Problem Relation Age of Onset   Prostate cancer Father     Social History   Tobacco Use   Smoking status: Former    Types: Cigarettes    Quit date:  10/20/2001    Years since quitting: 19.4   Smokeless tobacco: Never  Vaping Use   Vaping Use: Never used  Substance Use Topics   Alcohol use: Yes    Alcohol/week: 1.0 - 2.0 standard drink    Types: 1 - 2 Glasses of wine per week   Drug use: No    Home Medications Prior to Admission medications   Medication Sig Start Date End Date Taking? Authorizing Provider  acetaminophen (TYLENOL)  500 MG tablet Take 2 tablets (1,000 mg total) by mouth every 6 (six) hours as needed. Patient taking differently: Take 1,000 mg by mouth every 6 (six) hours as needed (PAIN). 12/25/17   Barrett, Erin R, PA-C  amoxicillin (AMOXIL) 500 MG capsule TAKE 4 TABLETS BY MOUTH 1 HOUR BEFORE DENTAL PROCEDURE Patient taking differently: Take 2,000 mg by mouth See admin instructions. TAKE 4 TABLETS (2000 MG) BY MOUTH 1 HOUR BEFORE DENTAL PROCEDURE 11/21/20   Jake BatheSkains, Mark C, MD  aspirin EC 81 MG tablet Take 81 mg by mouth in the morning. Swallow whole.    [provider]  atorvastatin (LIPITOR) 10 MG tablet Take 10 mg by mouth in the morning. 05/06/20   [provider]  Efinaconazole 10 % SOLN Apply 1 drop topically daily. Patient taking differently: Apply 1 drop topically daily. APPLIED TO TOE NAILS 02/11/21   Vivi BarrackWagoner, Matthew R, DPM  hydrochlorothiazide (HYDRODIURIL) 12.5 MG tablet Take 1 tablet by mouth once daily Patient taking differently: Take 12.5 mg by mouth in the morning. 01/14/21   Jake BatheSkains, Mark C, MD  hydrocortisone cream 1 % Apply 1 application topically daily as needed for itching.    [provider]  lisinopril (ZESTRIL) 5 MG tablet Take 5 mg by mouth in the morning.    [provider]  neomycin-bacitracin-polymyxin (NEOSPORIN) ointment Apply 1 application topically daily as needed for wound care.    [provider]  NONFORMULARY OR COMPOUNDED ITEM [facility]: Antifungal nail lacquer - Fluconazole 2%, Terbinafine 1%, DMSO. Patient not taking: Reported on 03/25/2021 09/22/18   Vivi BarrackWagoner, Matthew R, DPM  oxymetazoline (AFRIN) 0.05 % nasal spray Place 1 spray into both nostrils daily as needed for congestion.    [provider]    Allergies    Tetanus toxoid, adsorbed  Review of Systems   Review of Systems  Neurological:  Positive for speech difficulty (Resolved), weakness (Right-sided facial droop, resolved) and numbness (Resolved).  All other  systems reviewed and are negative.  Physical Exam Updated Vital Signs BP (!) 149/78   Pulse 60   Temp 99 F (37.2 C) (Oral)   Resp 15   Ht 5\' 11"  (1.803 m)   Wt 88.5 kg   SpO2 96%   BMI 27.20 kg/m   Physical Exam Vitals and nursing note reviewed.  Constitutional:      General: He is not in acute distress.    Appearance: Normal appearance.     Comments: Sitting in the bed in NAD  HENT:     Head: Normocephalic and atraumatic.  Eyes:     Conjunctiva/sclera: Conjunctivae normal.     Pupils: Pupils are equal, round, and reactive to light.  Cardiovascular:     Rate and Rhythm: Normal rate. Rhythm irregular.     Pulses: Normal pulses.     Heart sounds: Murmur heard.     Comments: Irregular rhythm.  Murmur present. Pulmonary:     Effort: Pulmonary effort is normal. No respiratory distress.     Breath sounds:  Normal breath sounds. No wheezing.     Comments: Speaking in full sentences.  Clear lung sounds in all fields. Abdominal:     General: There is no distension.     Palpations: Abdomen is soft.     Tenderness: There is no abdominal tenderness.  Musculoskeletal:        General: Normal range of motion.     Cervical back: Normal range of motion and neck supple.  Skin:    General: Skin is warm and dry.     Capillary Refill: Capillary refill takes less than 2 seconds.  Neurological:     General: No focal deficit present.     Mental Status: He is alert and oriented to person, place, and time.     GCS: GCS eye subscore is 4. GCS verbal subscore is 5. GCS motor subscore is 6.     Cranial Nerves: Cranial nerves are intact.     Sensory: Sensation is intact.     Motor: Motor function is intact.     Coordination: Coordination is intact.     Comments: Strength and sensation intact x4.  CN intact.  Nose to finger intact.  Negative pronator drift and Romberg test.  Visual fields grossly equal.  Ambulatory with normal gait.  Psychiatric:        Mood and Affect: Mood and affect  normal.        Speech: Speech normal.        Behavior: Behavior normal.    ED Results / Procedures / Treatments   Labs (all labs ordered are listed, but only abnormal results are displayed) Labs Reviewed  COMPREHENSIVE METABOLIC PANEL - Abnormal; Notable for the following components:      Result Value   Glucose, Bld 113 (*)    All other components within normal limits  I-STAT CHEM 8, ED - Abnormal; Notable for the following components:   Glucose, Bld 108 (*)    All other components within normal limits  RESP PANEL BY RT-PCR (FLU A&B, COVID) ARPGX2  PROTIME-INR  APTT  CBC  DIFFERENTIAL  ETHANOL  RAPID URINE DRUG SCREEN, HOSP PERFORMED  URINALYSIS, ROUTINE W REFLEX MICROSCOPIC    EKG None  Radiology CT HEAD WO CONTRAST  Result Date: 03/25/2021 CLINICAL DATA:  Possible TIA EXAM: CT HEAD WITHOUT CONTRAST TECHNIQUE: Contiguous axial images were obtained from the base of the skull through the vertex without intravenous contrast. COMPARISON:  None. FINDINGS: Brain: No hemorrhage is visualized. Rounded area of hypodensity at the left temporoparietal junction consistent with edema. No midline shift or significant mass effect. Mild atrophy. Nonenlarged ventricles. Vascular: No hyperdense vessels.  Carotid vascular calcification Skull: Normal. Negative for fracture or focal lesion. Sinuses/Orbits: Mucosal thickening in the sinuses Other: None IMPRESSION: 1. Focal hypodensity at the left temporoparietal junction consistent with an area of edema, probably due to small acute or subacute infarct. No definite hemorrhage at this time. 2. Atrophy Electronically Signed   By: Jasmine Pang M.D.   On: 03/25/2021 16:17    Procedures Procedures   Medications Ordered in ED Medications - No data to display  ED Course  I have reviewed the triage vital signs and the nursing notes.  Pertinent labs & imaging results that were available during my care of the patient were reviewed by me and considered in  my medical decision making (see chart for details).    MDM Rules/Calculators/A&P  Patient presenting for evaluation of an episode of right-sided facial numbness, speech deficit and facial droop.  Symptoms have resolved.  Patient has a normal neuro exam on my evaluation.  Labs and imaging obtained from triage interpreted by me.  Imaging concerning for a new area of edema which could be consistent with acute to subacute stroke.  As symptoms began today, favor acute stroke.  Will order MRI for further evaluation.  Will consult with neurology as patient likely had an acute stroke.  He will likely need to be admitted for further work-up and medical management.  Discussed with Dr. Selina Lara from neurology who recommends MRI, admission to medicine, and they will consult.  Discussed with Dr. Antionette Char from [facility] hospital[facility] service, patient to be admitted.  Final Clinical Impression(s) / ED Diagnoses Final diagnoses:  Cerebrovascular accident (CVA), unspecified mechanism [facility])    Rx / DC Orders ED Discharge Orders     None        Alveria Apley, PA-C 03/25/21 1922    Melene Plan, DO 03/25/21 2026

## 2021-03-25 NOTE — Telephone Encounter (Signed)
Patient states he has a cath scheduled for Thursday, but he is currently at the hospital. He states they think he may have had a TIA, but they are not sure. He says they just ran a scan. He would like a call back at his wife's number [phone].

## 2021-03-25 NOTE — ED Notes (Signed)
Pt returned from MRI °

## 2021-03-25 NOTE — H&P (Signed)
History and Physical    NAREG BREIGHNER CLE:[id number] DOB: 1945/12/31 DOA: 03/25/2021  PCP: Soundra Pilon, FNP   Patient coming from: Home   Chief Complaint: Slurred speech, right facial numbness and droop   HPI: Stephen Mullins is a 75 y.o. 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  Patient had been in his usual state when he took a nap at approximately 12:30 PM after doing some yard work.  When he woke at 1:30 PM, his right lower face felt numb similar to when he has had a nerve block for dental procedures.  He smiled in a mirror and noted the right lower face to be drooping.  He was slurring his words due to the facial weakness.  Symptoms completely resolved within approximately 10 to 15 minutes and then shortly after return for approximately 1 minute.  By time of arrival in the ED, he reports feeling completely normal.  He denies any associated chest pain.  He reports longstanding palpitations that he attributes to frequent PVCs but has not noticed any change in this.  No recent fevers, chills, fall, or trauma.  ED Course: Upon arrival to the ED, patient is found to be afebrile, saturating well on room air, and with stable blood pressure.  EKG features sinus rhythm with PVC and ST depressions.  Head CT with focal hypodensity at the left temporoparietal junction consistent with area of edema most likely related to acute or subacute infarction.  Neurology was consulted by the ED physician and hospitalists asked to admit.  Review of Systems:  All other systems reviewed and apart from HPI, are negative.  Past Medical History:  Diagnosis Date   Bicuspid aortic valve    with aortic  stenosis and mild insufficiency, mild to moderate aortic root dilitation   Borderline hyperlipidemia    Diabetes mellitus without complication (HCC)    Elevated fasting glucose    Elevated PSA    due to prostatitis   GERD (gastroesophageal reflux disease)    Heart murmur    Hypertension    Nail fungus    Thoracic ascending aortic aneurysm [facility])     Past Surgical History:  Procedure Laterality Date   AORTIC VALVE REPLACEMENT N/A 12/20/2017   Procedure: AORTIC VALVE REPLACEMENT (AVR);  Surgeon: [name], [name], MD;  Location: [facility] OR;  Service: Open Heart Surgery;  Laterality: N/A;   CATARACT EXTRACTION, BILATERAL     RIGHT/LEFT HEART CATH AND CORONARY ANGIOGRAPHY N/A 11/09/2017   Procedure: RIGHT/LEFT HEART CATH AND CORONARY ANGIOGRAPHY;  Surgeon: [name], MD;  Location: [facility] INVASIVE CV LAB;  Service: Cardiovascular;  Laterality: N/A;   TEE WITHOUT CARDIOVERSION N/A 12/20/2017   Procedure: TRANSESOPHAGEAL ECHOCARDIOGRAM (TEE);  Surgeon: [name], [name], MD;  Location: [facility] OR;  Service: Open Heart Surgery;  Laterality: N/A;    Social History:   reports that he quit smoking about 19 years ago. His smoking use included cigarettes. He has never used smokeless tobacco. He reports current alcohol use of about 1.0 - 2.0 standard drink of alcohol per week. He reports that he does not use drugs.  Allergies  Allergen Reactions   Tetanus Toxoid, Adsorbed Other (See Comments)    Pt.  States as small child he had tetnus made from horse serum and he turned 'blue' ?? SHORTNESS OF BREATH ??    Family History  Problem Relation Age of Onset   Prostate cancer Father      Prior to Admission medications   Medication Sig Start Date End Date Taking? Authorizing Provider  acetaminophen (TYLENOL) 500 MG tablet Take 2 tablets (1,000 mg total) by mouth every 6 (six) hours as needed. Patient taking differently: Take 1,000 mg by mouth every 6 (six) hours as needed (PAIN). 12/25/17   Barrett,  Erin R, PA-C  amoxicillin (AMOXIL) 500 MG capsule TAKE 4 TABLETS BY MOUTH 1 HOUR BEFORE DENTAL PROCEDURE Patient taking differently: Take 2,000 mg by mouth See admin instructions. TAKE 4 TABLETS (2000 MG) BY MOUTH 1 HOUR BEFORE DENTAL PROCEDURE 11/21/20   Jake Bathe, MD  aspirin EC 81 MG tablet Take 81 mg by mouth in the morning. Swallow whole.    [provider]  atorvastatin (LIPITOR) 10 MG tablet Take 10 mg by mouth in the morning. 05/06/20   [provider]  Efinaconazole 10 % SOLN Apply 1 drop topically daily. Patient taking differently: Apply 1 drop topically daily. APPLIED TO TOE NAILS 02/11/21   Vivi Barrack, DPM  hydrochlorothiazide (HYDRODIURIL) 12.5 MG tablet Take 1 tablet by mouth once daily Patient taking differently: Take 12.5 mg by mouth in the morning. 01/14/21   Jake Bathe, MD  hydrocortisone cream 1 % Apply 1 application topically daily as needed for itching.    [provider]  lisinopril (ZESTRIL) 5 MG tablet Take 5 mg by mouth in the morning.    [provider]  neomycin-bacitracin-polymyxin (NEOSPORIN) ointment Apply 1 application topically daily as needed for wound care.    [provider]  NONFORMULARY OR COMPOUNDED ITEM [facility]: Antifungal nail lacquer - Fluconazole 2%, Terbinafine 1%, DMSO. Patient not taking: Reported on 03/25/2021 09/22/18   Vivi Barrack, DPM  oxymetazoline (AFRIN) 0.05 % nasal spray Place 1 spray into both nostrils daily as needed for congestion.    [provider]    Physical Exam: Vitals:   03/25/21 1701 03/25/21 1828 03/25/21 1830 03/25/21 1845  BP: 130/71 (!) 175/109 (!) 175/83 (!) 149/78  Pulse: 62 66 61 60  Resp: 18 10 17 15   Temp: 99 F (37.2 C)     TempSrc: Oral     SpO2: 97% 98% 97% 96%  Weight:      Height:        Constitutional: NAD, calm  Eyes: PERTLA, lids and conjunctivae normal ENMT: Mucous membranes are moist. Posterior pharynx clear of any  exudate or lesions.   Neck: supple, no masses  Respiratory: no wheezing, no crackles. No accessory muscle use.  Cardiovascular: S1 & S2 heard, regular rate and rhythm. No extremity edema.   Abdomen: No distension, no tenderness, soft. Bowel sounds active.  Musculoskeletal: no clubbing / cyanosis. No joint deformity upper and lower extremities.   Skin: no significant rashes, lesions, ulcers. Warm, dry, well-perfused. Neurologic: CN 2-12 grossly intact. Sensation intact, DTR normal. Strength 5/5 in all 4 limbs.  Psychiatric: Alert and oriented to person, place, and situation. Very pleasant and cooperative.    Labs and Imaging on Admission: I have personally reviewed following labs and imaging studies  CBC: Recent Labs  Lab 03/24/21 1037 03/25/21 1536 03/25/21 1632  WBC 6.9 8.3  --   NEUTROABS  --  5.6  --   HGB 15.4 15.4 15.6  HCT 45.9 46.5 46.0  MCV 89 89.8  --   PLT 239 250  --    Basic Metabolic Panel: Recent Labs  Lab 03/24/21 1037 03/25/21 1536 03/25/21 1632  NA 139 139 141  K 4.3 4.2 4.1  CL 102 103 105  CO2 30* 28  --   GLUCOSE 121* 113* 108*  BUN 15 16 19   CREATININE 0.83 1.09 1.00  CALCIUM 10.5* 10.0  --    GFR: Estimated Creatinine Clearance: 68 mL/min (by C-G formula based on SCr of 1 mg/dL). Liver Function Tests: Recent Labs  Lab 03/25/21 1536  AST 23  ALT 24  ALKPHOS 43  BILITOT 0.6  PROT 6.8  ALBUMIN 4.0   No results for input(s): LIPASE, AMYLASE in the last 168 hours. No results for input(s): AMMONIA in the last 168 hours. Coagulation Profile: Recent Labs  Lab 03/25/21 1536  INR 1.1   Cardiac Enzymes: No results for input(s): CKTOTAL, CKMB, CKMBINDEX, TROPONINI in the last 168 hours. BNP (last 3 results) No results for input(s): PROBNP in the last 8760 hours. HbA1C: No results for input(s): HGBA1C in the last 72 hours. CBG: No results for input(s): GLUCAP in the last 168 hours. Lipid Profile: No results for input(s): CHOL, HDL,  LDLCALC, TRIG, CHOLHDL, LDLDIRECT in the last 72 hours. Thyroid Function Tests: No results for input(s): TSH, T4TOTAL, FREET4, T3FREE, THYROIDAB in the last 72 hours. Anemia Panel: No results for input(s): VITAMINB12, FOLATE, FERRITIN, TIBC, IRON, RETICCTPCT in the last 72 hours. Urine analysis:    Component Value Date/Time   COLORURINE YELLOW 12/16/2017 1200   APPEARANCEUR Clear 01/10/2018 1224   LABSPEC 1.021 12/16/2017 1200   PHURINE 5.0 12/16/2017 1200   GLUCOSEU Negative 01/10/2018 1224   HGBUR NEGATIVE 12/16/2017 1200   BILIRUBINUR Negative 01/10/2018 1224   KETONESUR NEGATIVE 12/16/2017 1200   PROTEINUR Negative 01/10/2018 1224   PROTEINUR NEGATIVE 12/16/2017 1200   NITRITE Negative 01/10/2018 1224   NITRITE NEGATIVE 12/16/2017 1200   LEUKOCYTESUR Negative 01/10/2018 1224   Sepsis Labs: @LABRCNTIP (procalcitonin:4,lacticidven:4) )No results found for this or any previous visit (from the past 240 hour(s)).   Radiological Exams on Admission: CT HEAD WO CONTRAST  Result Date: 03/25/2021 CLINICAL DATA:  Possible TIA EXAM: CT HEAD WITHOUT CONTRAST TECHNIQUE: Contiguous axial images were obtained from the base of the skull through the vertex without intravenous contrast. COMPARISON:  None. FINDINGS: Brain: No hemorrhage is visualized. Rounded area of hypodensity at the left temporoparietal junction consistent with edema. No midline shift or significant mass effect. Mild atrophy. Nonenlarged ventricles. Vascular: No hyperdense vessels.  Carotid vascular calcification Skull: Normal. Negative for fracture or focal lesion. Sinuses/Orbits: Mucosal thickening in the sinuses Other: None IMPRESSION: 1. Focal hypodensity at the left temporoparietal junction consistent with an area of edema, probably due to small acute or subacute infarct. No definite hemorrhage at this time. 2. Atrophy Electronically Signed   By: M.D.   On: 03/25/2021 16:17    EKG: Independently reviewed. Sinus  rhythm, PVC, ST-depressions.   Assessment/Plan   1. Acute ischemic CVA  - Presents after an episode of dysarthria and right facial numbness and weakness that has resolved, and is found to have hypodensity at left tempoparietal junction on CT  - Neurology consulting and much appreciated  - Stroke swallow screen passed in ED  - Continue cardiac monitoring and neuro checks, check MRI brain, MRA head, carotid Jasmine Pang, TTE, lipids, and A1c - Continue daily ASA 81 and add Plavix 75  mg for 21 days  - Permit HTN to 220/110 in acute-phase    2. CAD  - No anginal complaints  - Had mild CAD in 2019, recent abnormal stress test and was planned for heart cath on 7/21  - Continue antiplatelets, statin; no longer on beta-blocker d/t bradycardia    3. Hypertension  - Permit BP up to 220/110 in acute-phase of ischemic CVA    4. Hyperlipidemia  - Continue Lipitor    5. Thoracic aortic aneurysm  - Asymptomatic, continue CT surgery follow-up as planned     DVT prophylaxis: Lovenox  Code Status: Full  Level of Care: Level of care: Telemetry Medical Family Communication: Wife updated at bedside Disposition Plan:  Patient is from: Home  Anticipated d/c is to: Home  Anticipated d/c date is: 7/20 or 03/27/21 Patient currently: Pending stroke workup  Consults called: Neurology  Admission status: Observation     Briscoe Deutscherimothy S Caetano Oberhaus, MD [facility]  03/25/2021, 8:40 PM

## 2021-03-25 NOTE — ED Notes (Signed)
Pt transported to MRI 

## 2021-03-25 NOTE — ED Notes (Signed)
Pt reports taking a nap and waking up and R side of mouth was numb and said he looked in a mirror and noted R facial droop. Pt's wife reports pt was slurring words. Pt reports initial episode lasted 10 minutes. Pt then reported about 10 minutes later he faintly felt the numbness to the R side of his mouth that lasted about 2 minutes before dissipating.

## 2021-03-25 NOTE — ED Notes (Signed)
Pt ambulating around room with wife at bedside. Pt was provided with [place] sandwich and cola.

## 2021-03-26 ENCOUNTER — Telehealth: Payer: Self-pay | Admitting: *Deleted

## 2021-03-26 ENCOUNTER — Encounter (HOSPITAL_COMMUNITY): Payer: Self-pay | Admitting: Family Medicine

## 2021-03-26 ENCOUNTER — Other Ambulatory Visit (HOSPITAL_COMMUNITY): Payer: Self-pay | Admitting: Emergency Medicine

## 2021-03-26 ENCOUNTER — Observation Stay (HOSPITAL_COMMUNITY): Payer: Medicare HMO

## 2021-03-26 ENCOUNTER — Other Ambulatory Visit: Payer: Self-pay

## 2021-03-26 DIAGNOSIS — H34232 Retinal artery branch occlusion, left eye: Secondary | ICD-10-CM | POA: Diagnosis not present

## 2021-03-26 DIAGNOSIS — I771 Stricture of artery: Secondary | ICD-10-CM | POA: Diagnosis not present

## 2021-03-26 DIAGNOSIS — I2510 Atherosclerotic heart disease of native coronary artery without angina pectoris: Secondary | ICD-10-CM | POA: Diagnosis not present

## 2021-03-26 DIAGNOSIS — I712 Thoracic aortic aneurysm, without rupture: Secondary | ICD-10-CM | POA: Diagnosis not present

## 2021-03-26 DIAGNOSIS — R339 Retention of urine, unspecified: Secondary | ICD-10-CM | POA: Diagnosis present

## 2021-03-26 DIAGNOSIS — Z953 Presence of xenogenic heart valve: Secondary | ICD-10-CM | POA: Diagnosis not present

## 2021-03-26 DIAGNOSIS — K219 Gastro-esophageal reflux disease without esophagitis: Secondary | ICD-10-CM | POA: Diagnosis not present

## 2021-03-26 DIAGNOSIS — I63232 Cerebral infarction due to unspecified occlusion or stenosis of left carotid arteries: Secondary | ICD-10-CM | POA: Diagnosis not present

## 2021-03-26 DIAGNOSIS — Z7902 Long term (current) use of antithrombotics/antiplatelets: Secondary | ICD-10-CM | POA: Diagnosis not present

## 2021-03-26 DIAGNOSIS — I639 Cerebral infarction, unspecified: Secondary | ICD-10-CM

## 2021-03-26 DIAGNOSIS — R29810 Facial weakness: Secondary | ICD-10-CM | POA: Diagnosis not present

## 2021-03-26 DIAGNOSIS — Z79899 Other long term (current) drug therapy: Secondary | ICD-10-CM | POA: Diagnosis not present

## 2021-03-26 DIAGNOSIS — I10 Essential (primary) hypertension: Secondary | ICD-10-CM | POA: Diagnosis not present

## 2021-03-26 DIAGNOSIS — Z87891 Personal history of nicotine dependence: Secondary | ICD-10-CM | POA: Diagnosis not present

## 2021-03-26 DIAGNOSIS — R4701 Aphasia: Secondary | ICD-10-CM | POA: Diagnosis not present

## 2021-03-26 DIAGNOSIS — R338 Other retention of urine: Secondary | ICD-10-CM | POA: Diagnosis not present

## 2021-03-26 DIAGNOSIS — I6522 Occlusion and stenosis of left carotid artery: Secondary | ICD-10-CM | POA: Diagnosis not present

## 2021-03-26 DIAGNOSIS — Z8042 Family history of malignant neoplasm of prostate: Secondary | ICD-10-CM | POA: Diagnosis not present

## 2021-03-26 DIAGNOSIS — R471 Dysarthria and anarthria: Secondary | ICD-10-CM | POA: Diagnosis present

## 2021-03-26 DIAGNOSIS — Z7982 Long term (current) use of aspirin: Secondary | ICD-10-CM | POA: Diagnosis not present

## 2021-03-26 DIAGNOSIS — I6389 Other cerebral infarction: Secondary | ICD-10-CM | POA: Diagnosis not present

## 2021-03-26 DIAGNOSIS — E119 Type 2 diabetes mellitus without complications: Secondary | ICD-10-CM | POA: Diagnosis not present

## 2021-03-26 DIAGNOSIS — Z20822 Contact with and (suspected) exposure to covid-19: Secondary | ICD-10-CM | POA: Diagnosis not present

## 2021-03-26 DIAGNOSIS — E7800 Pure hypercholesterolemia, unspecified: Secondary | ICD-10-CM | POA: Diagnosis not present

## 2021-03-26 DIAGNOSIS — Z9889 Other specified postprocedural states: Secondary | ICD-10-CM | POA: Diagnosis not present

## 2021-03-26 HISTORY — PX: IR ANGIO VERTEBRAL SEL SUBCLAVIAN INNOMINATE BILAT MOD SED: IMG5366

## 2021-03-26 HISTORY — PX: IR ANGIO EXTERNAL CAROTID SEL EXT CAROTID BILAT MOD SED: IMG5372

## 2021-03-26 HISTORY — PX: IR ANGIO INTRA EXTRACRAN SEL COM CAROTID INNOMINATE BILAT MOD SED: IMG5360

## 2021-03-26 LAB — LIPID PANEL
Cholesterol: 154 mg/dL (ref 0–200)
HDL: 39 mg/dL — ABNORMAL LOW (ref >40)
LDL Cholesterol: 91 mg/dL (ref 0–99)
Total CHOL/HDL Ratio: 3.9 RATIO
Triglycerides: 118 mg/dL (ref <150)
VLDL: 24 mg/dL (ref 0–40)

## 2021-03-26 LAB — HEMOGLOBIN A1C
Hgb A1c MFr Bld: 6.4 % — ABNORMAL HIGH (ref 4.8–5.6)
Mean Plasma Glucose: 136.98 mg/dL

## 2021-03-26 LAB — BASIC METABOLIC PANEL
Anion gap: 5 (ref 5–15)
BUN: 14 mg/dL (ref 8–23)
CO2: 27 mmol/L (ref 22–32)
Calcium: 9.5 mg/dL (ref 8.9–10.3)
Chloride: 105 mmol/L (ref 98–111)
Creatinine, Ser: 0.95 mg/dL (ref 0.61–1.24)
GFR, Estimated: >60 mL/min (ref >60)
Glucose, Bld: 141 mg/dL — ABNORMAL HIGH (ref 70–99)
Potassium: 3.8 mmol/L (ref 3.5–5.1)
Sodium: 137 mmol/L (ref 135–145)

## 2021-03-26 LAB — ECHOCARDIOGRAM COMPLETE
AR max vel: 2.5 cm2
AV Area VTI: 2.67 cm2
AV Area mean vel: 2.62 cm2
AV Mean grad: 12 mmHg
AV Peak grad: 22.5 mmHg
Ao pk vel: 2.37 m/s
Area-P 1/2: 2 cm2
Height: 71 in
S' Lateral: 3 cm
Weight: 3120 oz

## 2021-03-26 LAB — CBC
HCT: 43.4 % (ref 39.0–52.0)
Hemoglobin: 14.6 g/dL (ref 13.0–17.0)
MCH: 29.8 pg (ref 26.0–34.0)
MCHC: 33.6 g/dL (ref 30.0–36.0)
MCV: 88.6 fL (ref 80.0–100.0)
Platelets: 215 10*3/uL (ref 150–400)
RBC: 4.9 MIL/uL (ref 4.22–5.81)
RDW: 12.5 % (ref 11.5–15.5)
WBC: 7.1 10*3/uL (ref 4.0–10.5)
nRBC: 0 % (ref 0.0–0.2)

## 2021-03-26 LAB — ETHANOL: Alcohol, Ethyl (B): <10 mg/dL (ref <10)

## 2021-03-26 IMAGING — XA IR CAROTID EXTERNAL BILATERAL
11 of 15 series · 12 of 24 positions shown · IV contrast (IODINE)
Comparison: Recent MRI of the brain and MRA of the brain.

CLINICAL DATA: New onset of expressive aphasia. Severe narrowing of
the left internal carotid artery proximally on ultrasound of the
head and neck.

EXAM:
IR ANGIO EXTERNAL CAROTID SEL EXT CAROTID BILAT MOD SED
TECHNIQUE: Informed written consent was obtained from the patient after a
thorough discussion of the procedural risks, benefits and
alternatives. All questions were addressed. Maximal Sterile Barrier
Technique was utilized including caps, mask, sterile gowns, sterile
gloves, sterile drape, hand hygiene and skin antiseptic. A timeout
was performed prior to the initiation of the procedure.

[Series 1: cerebral · 2 acquisitions, 1 frame shown (1 of 10)]
[im 1/2]
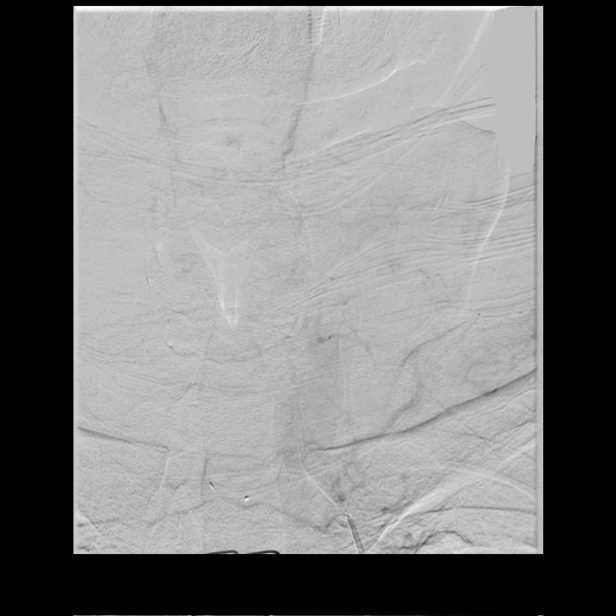

[Series 3: cerebral · 2 acquisitions, 1 frame shown (2 of 10)]
[im 1/2]
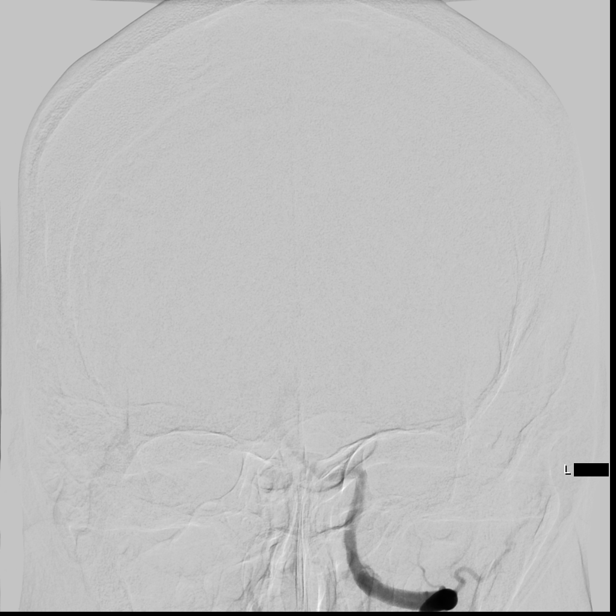

[Series 4: cerebral · 2 acquisitions, 1 frame shown (3 of 10)]
[im 1/2]
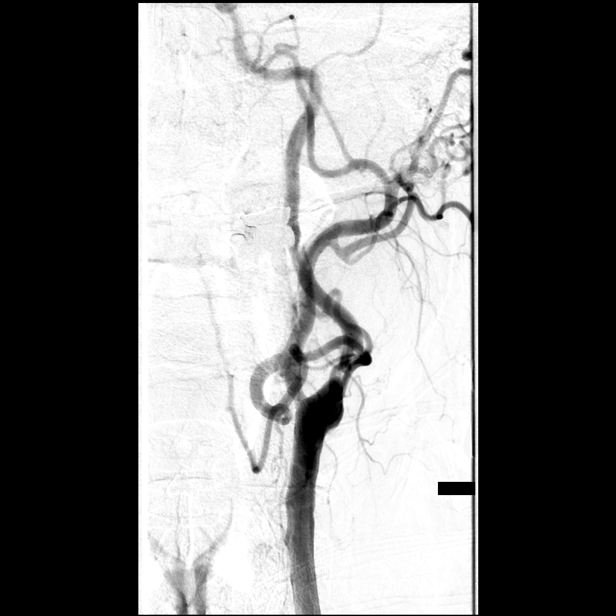

[Series 5: cerebral · 2 acquisitions, 1 frame shown (4 of 10)]
[im 1/2]
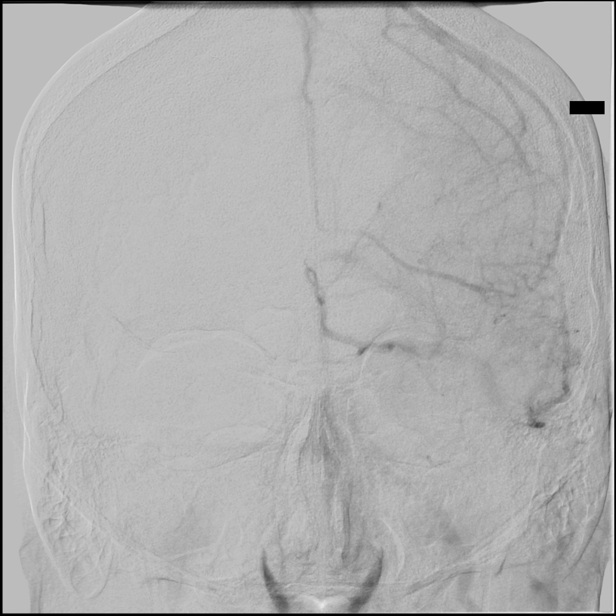

[Series 7: cerebral · 2 acquisitions, 1 frame shown (5 of 10)]
[im 1/2]
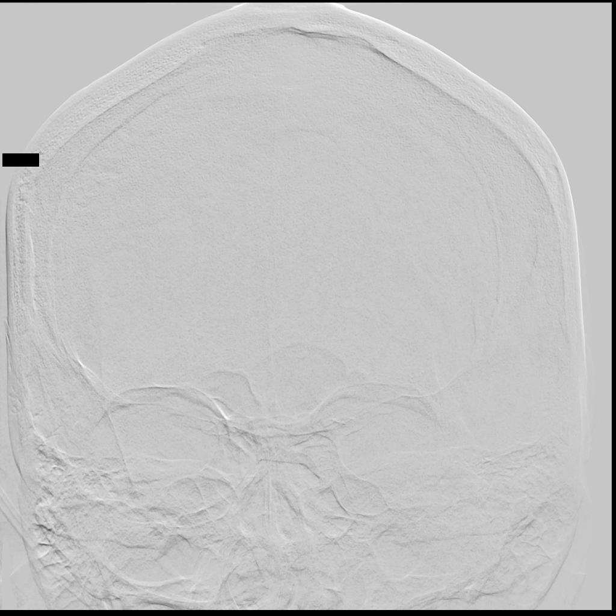

[Series 8: cerebral · 2 acquisitions, 1 frame shown (6 of 10)]
[im 1/2]
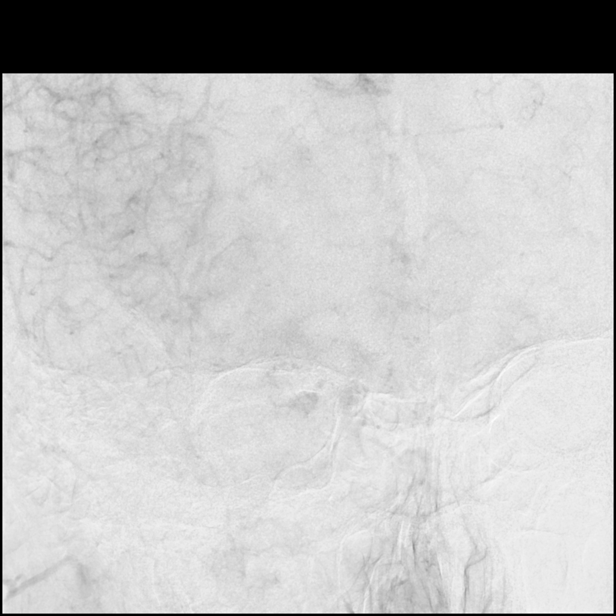

[Series 10: cerebral · 1 of 13 frames shown (7 of 10)]
[frame 2/13]
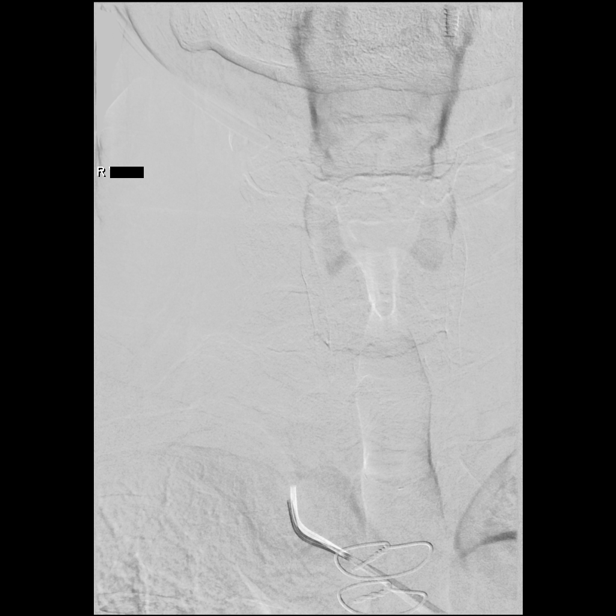

[Series 11: cerebral · 2 acquisitions, 1 frame shown (8 of 10)]
[im 1/2]
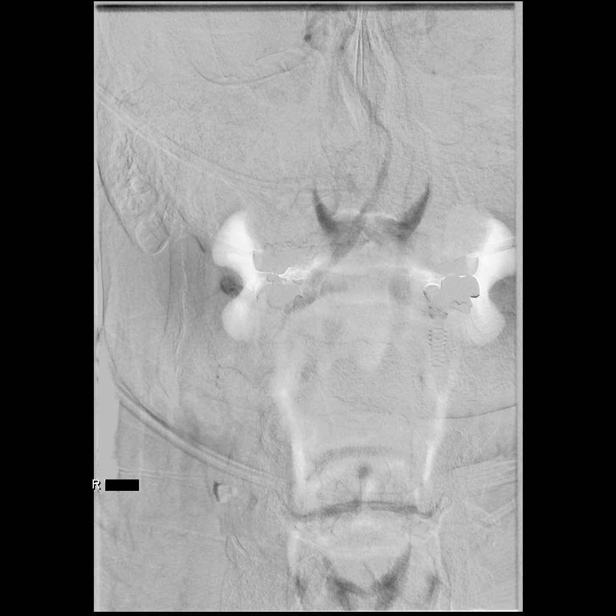

[Series 13: cerebral · 2 acquisitions, 1 frame shown (9 of 10)]
[im 1/2]
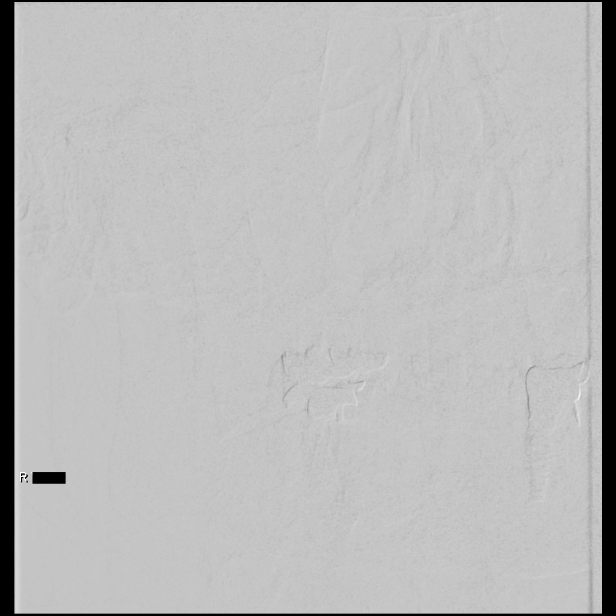

[Series 14: cerebral · 2 acquisitions, 1 frame shown (10 of 10)]
[im 1/2]
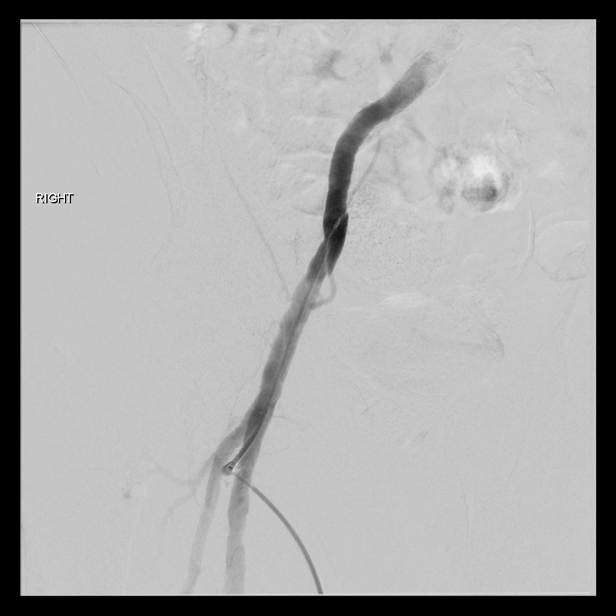

[Series 300: dr. (person_name) · 2 of 209 slices shown]
[im 84/209]
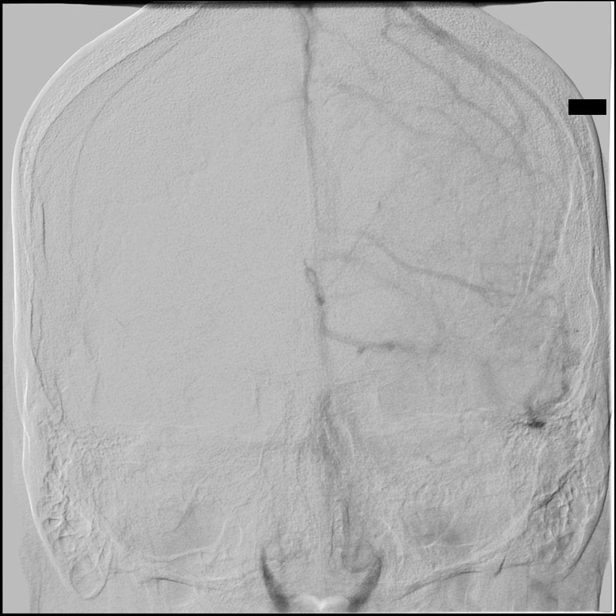
[im 188/209]
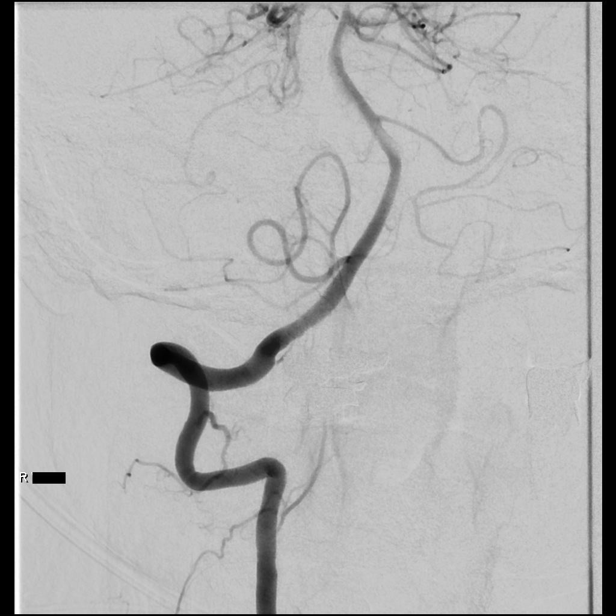

[12 of 24 positions shown; findings below may reference images not displayed]

MEDICATIONS:
Heparin [KB] units IV; no antibiotic was administered within 1 hour
of the procedure.

ANESTHESIA/SEDATION:
Versed 1 mg IV; Fentanyl 25 mcg IV

Moderate Sedation Time:  36 minutes

The patient was continuously monitored during the procedure by the
interventional radiology nurse under my direct supervision.

CONTRAST:  Omnipaque 300 65 mL.

FLUOROSCOPY TIME:  Fluoroscopy Time: 14 minutes 18 seconds (984.6
mGy).

COMPLICATIONS:
None immediate.
The right groin was prepped and draped in the usual sterile fashion.
Thereafter using modified Seldinger technique, transfemoral access
into the right common femoral artery was obtained without
difficulty. Over a 0.035 inch guidewire, a 5 French Pinnacle sheath
was inserted. Through this, and also over 0.035 inch guidewire, a 5
French JB 1 catheter was advanced to the aortic arch region and
selectively positioned in the right common carotid artery, the right
vertebral artery, the left common carotid artery and the left
vertebral artery.
FINDINGS: .:
FINDINGS: .
The right vertebral artery origin is widely patent.

The vessel is seen to opacify to the cranial skull base. Wide
patency is seen of the left vertebrobasilar junction and the left
posterior-inferior cerebellar artery.

The basilar artery, the posterior cerebral arteries, the superior
cerebellar arteries and the anterior-inferior cerebellar arteries
opacify into the capillary and venous phases. Unopacified blood is
seen in the basilar artery from the contralateral vertebral artery.

The left common carotid arteriogram demonstrates the left external
carotid artery to be mildly narrowed at its origin. Its branches are
widely patent.

The left internal carotid artery just distal to the bulb has a
severe pre occlusive stenosis secondary to a circumferential plaque.
More distally ascent of contrast is seen to the cranial skull base
in the left internal carotid artery. The petrous, the cavernous and
the supraclinoid segments are widely patent.

The left middle cerebral artery is seen to opacify into the
capillary venous phases with unopacified blood seen in the left
middle cerebral artery from the contralateral right ICA via the
anterior communicating artery. No antegrade opacification of the
left anterior cerebral artery is seen.

The right common carotid arteriogram demonstrates approximately
50-60% stenosis at the origin of the right external carotid artery.
The right internal carotid artery at the bulb to the cranial skull
base is widely patent.

The petrous, the cavernous and the supraclinoid segments are patent
with mild arteriosclerotic changes seen in the distal cavernous
segment.

The supraclinoid right ICA is widely patent. The right middle
cerebral artery and the right anterior cerebral artery opacify into
the capillary and venous phases. Prompt cross-filling via the
anterior communicating artery of the left anterior cerebral artery
A2 segment and distally, the left A1 segment and the left middle
cerebral artery transiently is seen.

The right vertebral artery origin is widely patent.

The vessel is seen to opacify to the cranial skull base. Patency is
seen of the right vertebrobasilar junction and the right
posterior-inferior cerebellar artery.

The basilar artery, the posterior cerebral arteries, the superior
cerebellar arteries and the anterior-inferior cerebellar arteries
opacify into the capillary and venous phases. Transient
opacification of the left vertebrobasilar junction is seen from the
right vertebral artery injection.
IMPRESSION: Severe pre occlusive stenosis of the left internal carotid artery
just distal to the bulb secondary to a circumferential
atherosclerotic plaque.

PLAN:
Findings reviewed with the patient and the spouse, and referring
neurologist.

It was decided to proceed with endovascular revascularization of the
severely stenotic symptomatic left internal carotid artery as soon
as possible.

## 2021-03-26 MED ORDER — SODIUM CHLORIDE 0.9 % IV SOLN: INTRAVENOUS | Status: DC

## 2021-03-26 MED ORDER — MIDAZOLAM HCL 2 MG/2ML IJ SOLN
INTRAMUSCULAR | Status: AC
  Filled 2021-03-26: qty 2

## 2021-03-26 MED ORDER — HEPARIN SODIUM (PORCINE) 1000 UNIT/ML IJ SOLN
INTRAMUSCULAR | Status: AC
  Filled 2021-03-26: qty 1

## 2021-03-26 MED ORDER — MIDAZOLAM HCL 2 MG/2ML IJ SOLN
INTRAMUSCULAR | Status: AC | PRN
Start: 2021-03-26 — End: 2021-03-26
  Administered 2021-03-26: 1 mg via INTRAVENOUS

## 2021-03-26 MED ORDER — FENTANYL CITRATE (PF) 100 MCG/2ML IJ SOLN
INTRAMUSCULAR | Status: AC | PRN
  Administered 2021-03-26: 25 ug via INTRAVENOUS

## 2021-03-26 MED ORDER — TICAGRELOR 90 MG PO TABS
180.0000 mg | ORAL_TABLET | Freq: Once | ORAL | Status: AC
  Administered 2021-03-26: 180 mg via ORAL
  Filled 2021-03-26: qty 2

## 2021-03-26 MED ORDER — IOHEXOL 300 MG/ML  SOLN
100.0000 mL | Freq: Once | INTRAMUSCULAR | Status: AC | PRN
  Administered 2021-03-26: 60 mL

## 2021-03-26 MED ORDER — FENTANYL CITRATE (PF) 100 MCG/2ML IJ SOLN
INTRAMUSCULAR | Status: AC
  Filled 2021-03-26: qty 2

## 2021-03-26 MED ORDER — LIDOCAINE HCL 1 % IJ SOLN
INTRAMUSCULAR | Status: AC
  Filled 2021-03-26: qty 20

## 2021-03-26 MED ORDER — IOHEXOL 300 MG/ML  SOLN
50.0000 mL | Freq: Once | INTRAMUSCULAR | Status: AC | PRN
  Administered 2021-03-26: 30 mL

## 2021-03-26 MED ORDER — HEPARIN SODIUM (PORCINE) 1000 UNIT/ML IJ SOLN
INTRAMUSCULAR | Status: AC | PRN
  Administered 2021-03-26: 1000 [IU] via INTRAVENOUS

## 2021-03-26 MED ORDER — TICAGRELOR 90 MG PO TABS
90.0000 mg | ORAL_TABLET | Freq: Two times a day (BID) | ORAL | Status: DC
  Administered 2021-03-26: 90 mg via ORAL
  Filled 2021-03-26: qty 1

## 2021-03-26 NOTE — Progress Notes (Signed)
PT Cancellation Note  Patient Details Name: RASHEED WELTY MRN: [id number] DOB: 1946-03-14   Cancelled Treatment:    Reason Eval/Treat Not Completed: Other (comment) SLP currently in room. Will follow up as schedule allows.   Cindee Salt, DPT  Acute Rehabilitation Services  Pager: [phone] Office: [phone]    Lehman Prom 03/26/2021, 10:32 AM

## 2021-03-26 NOTE — ED Notes (Signed)
Provider at bedside

## 2021-03-26 NOTE — Evaluation (Signed)
Physical Therapy Evaluation Patient Details Name: Stephen Mullins MRN: [id number] DOB: 1945/10/08 Today's Date: 03/26/2021   History of Present Illness  75 yo admitted 7/19 with transient R facial numbness, R facial droop and R sided weakness. 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 PMH significant for hyperlipidemia, diabetes, hypertension, GERD, aortic valve replacement.  Clinical Impression  Pt admitted secondary to problem above with deficits below. Pt with expressive difficulty throughout and easily frustrated. Instability noted, especially with dynamic tasks. LOB X2-3 noted and required min A for steadying. Educated about using RW at home for increased safety. Recommending outpatient neuro PT to address current deficits. Will continue to follow acutely.     Follow Up Recommendations Outpatient PT;Supervision for mobility/OOB (neuro outpatient PT)    Equipment Recommendations  None recommended by PT    Recommendations for Other Services       Precautions / Restrictions Precautions Precautions: Fall Restrictions Weight Bearing Restrictions: No      Mobility  Bed Mobility Overal bed mobility: Modified Independent                  Transfers Overall transfer level: Needs assistance Equipment used: None Transfers: Sit to/from Stand Sit to Stand: Min assist         General transfer comment: Min A initially to stand for steadying assist.  Ambulation/Gait Ambulation/Gait assistance: Min assist;Min guard Gait Distance (Feet): 75 Feet Assistive device: None Gait Pattern/deviations: Step-through pattern;Decreased stride length;Drifts right/left Gait velocity: Mildly decreased   General Gait Details: Tended to drift to the R and ran into objects on the R. Increased unsteadiness with horizontal and vertical head turns. LOB noted and required min A for steadying. Educated about using RW at home for increased Therapist, occupational Rankin (Stroke Patients Only) Modified Rankin (Stroke Patients Only) Pre-Morbid Rankin Score: No symptoms Modified Rankin: Moderately severe disability     Balance Overall balance assessment: Needs assistance Sitting-balance support: No upper extremity supported Sitting balance-Leahy Scale: Good     Standing balance support: No upper extremity supported;During functional activity Standing balance-Leahy Scale: Fair                               Pertinent Vitals/Pain Pain Assessment: No/denies pain Faces Pain Scale: No hurt    Home Living Family/patient expects to be discharged to:: Private residence Living Arrangements: Spouse/significant other Available Help at Discharge: Family;Available 24 hours/day Type of Home: House Home Access: Stairs to enter Entrance Stairs-Rails: None Entrance Stairs-Number of Steps: 3 Home Layout: One level Home Equipment: Walker - 2 wheels;Cane - single point;Bedside commode;Shower seat      Prior Function Level of Independence: Independent         Comments: drives     Hand Dominance   Dominant Hand: Right    Extremity/Trunk Assessment   Upper Extremity Assessment Upper Extremity Assessment: Defer to OT evaluation    Lower Extremity Assessment Lower Extremity Assessment: Overall WFL for tasks assessed    Cervical / Trunk Assessment Cervical / Trunk Assessment: Normal  Communication   Communication: Expressive difficulties  Cognition Arousal/Alertness: Awake/alert Behavior During Therapy: Anxious Overall Cognitive Status: Difficult to assess  General Comments: Difficult to assess due to language deficits      General Comments      Exercises     Assessment/Plan    PT Assessment Patient needs continued PT services  PT Problem List Decreased balance;Decreased mobility;Decreased knowledge of use of DME       PT  Treatment Interventions Gait training;DME instruction;Stair training;Functional mobility training;Therapeutic exercise;Therapeutic activities;Balance training;Patient/family education;Neuromuscular re-education    PT Goals (Current goals can be found in the Care Plan section)  Acute Rehab PT Goals Patient Stated Goal: to go home PT Goal Formulation: With patient Time For Goal Achievement: 04/09/21 Potential to Achieve Goals: Good    Frequency Min 4X/week   Barriers to discharge        Co-evaluation               AM-PAC PT "6 Clicks" Mobility  Outcome Measure Help needed turning from your back to your side while in a flat bed without using bedrails?: None Help needed moving from lying on your back to sitting on the side of a flat bed without using bedrails?: None Help needed moving to and from a bed to a chair (including a wheelchair)?: A Little Help needed standing up from a chair using your arms (e.g., wheelchair or bedside chair)?: A Little Help needed to walk in hospital room?: A Little Help needed climbing 3-5 steps with a railing? : A Little 6 Click Score: 20    End of Session Equipment Utilized During Treatment: Gait belt Activity Tolerance: Patient tolerated treatment well Patient left: in bed;with call bell/phone within reach (on stretcher in ED with transport staff present) Nurse Communication: Mobility status PT Visit Diagnosis: Unsteadiness on feet (R26.81);Other symptoms and signs involving the nervous system (Q49.201)    Time: 0071-2197 PT Time Calculation (min) (ACUTE ONLY): 15 min   Charges:   PT Evaluation $PT Eval Moderate Complexity: 1 Mod          Farley Ly, PT, DPT  Acute Rehabilitation Services  Pager: [phone] Office: [phone]   Lehman Prom 03/26/2021, 1:30 PM

## 2021-03-26 NOTE — ED Notes (Signed)
Patient transported to IR 

## 2021-03-26 NOTE — Telephone Encounter (Signed)
Pt currently in ED with stroke- Per Dr Larey Days - agree with cancel of cath for now  I have cancelled cardiac cath scheduled for 03/27/21-I placed call to patient's wife (DPR) to let her know cardiac cath 03/27/21 has been cancelled. I received name ID voicemail message at her number, left message cardiac cath has been cancelled for tomorrow, call office if questions.

## 2021-03-26 NOTE — Sedation Documentation (Signed)
Patient's groin site closed with a 5 fr exoseal at 1442.    Groin site remains level 0, dressing clean/dry/intact.    Bedside report given to Sandria Senter, RN with pulses and groin site reviewed.

## 2021-03-26 NOTE — Progress Notes (Signed)
  Echocardiogram 2D Echocardiogram has been performed.  Delcie Roch 03/26/2021, 9:59 AM

## 2021-03-26 NOTE — ED Notes (Signed)
Neurology at bedside.

## 2021-03-26 NOTE — Procedures (Addendum)
S/P 4 vessel cerebral arteriogram. RT CFA approach  Findings. 1.Pre occlusive prox Lt ICA stenosis.  S.Kaedance Magos MD

## 2021-03-26 NOTE — Progress Notes (Addendum)
STROKE TEAM PROGRESS NOTE   INTERVAL HISTORY No significant overnight events. His wife is at bedside. Discussed workup thus far and concern for embolic source.  He continues to have significant expressive aphasia and MRI scan shows patchy left MCA frontal branch infarct.  MR angiogram shows diminished distal left MCA branches with no large vessel stenosis.  Echocardiogram is pending.  Hemoglobin A1c 6.4.  LDL cholesterol 91 mg percent. Vitals:   03/26/21 1006 03/26/21 1015 03/26/21 1030 03/26/21 1045  BP: (!) 156/74 (!) 145/107 (!) 148/74 (!) 152/76  Pulse: 71 70 65 67  Resp: 16 19 (!) 21 19  Temp: 99.4 F (37.4 C)     TempSrc: Oral     SpO2: 98% 98% 96% 96%  Weight:      Height:       CBC:  Recent Labs  Lab 03/25/21 1536 03/25/21 1632 03/26/21 0345  WBC 8.3  --  7.1  NEUTROABS 5.6  --   --   HGB 15.4 15.6 14.6  HCT 46.5 46.0 43.4  MCV 89.8  --  88.6  PLT 250  --  215   Basic Metabolic Panel:  Recent Labs  Lab 03/25/21 1536 03/25/21 1632 03/26/21 0345  NA 139 141 137  K 4.2 4.1 3.8  CL 103 105 105  CO2 28  --  27  GLUCOSE 113* 108* 141*  BUN 16 19 14   CREATININE 1.09 1.00 0.95  CALCIUM 10.0  --  9.5   Lipid Panel:  Recent Labs  Lab 03/26/21 0345  CHOL 154  TRIG 118  HDL 39*  CHOLHDL 3.9  VLDL 24  LDLCALC 91   HgbA1c:  Recent Labs  Lab 03/26/21 0345  HGBA1C 6.4*   Urine Drug Screen:  Recent Labs  Lab 03/25/21 2238  LABOPIA NONE DETECTED  COCAINSCRNUR NONE DETECTED  LABBENZ NONE DETECTED  AMPHETMU NONE DETECTED  THCU NONE DETECTED  LABBARB NONE DETECTED    Alcohol Level  Recent Labs  Lab 03/26/21 0345  ETH <10   Code Stroke CT head: left tempoparietal junction infarct acute vs subacute  MRI brain: multifocal acute infarcts within the posterior left MCA territory  Carotid 03/28/21: minimal right ICA stenosis. 80-89% stenosis of left ICA. Normal vertebral flow   PHYSICAL EXAM General: Well appearing elderly Caucasian male who appears  frustrated but not in distress Cardiac: RRR on telemetry with occassional PVCs Neurological Exam:  Mental status: alert and oriented Speech: expressive aphasia. Speech is hesitant with significant word finding difficulties and paraphasic errors.  Good comprehension.  Difficulty with naming and repetition. Cranial nerves: EOM and peripheral vision intact. No facial droop. Hearing intact. Tongue midline.  Motor: 5/5 strength in the bilateral upper and lower extremities. Normal bulk and tone of muscle.  Sensation intact throughout.  Normal finger to nose. Normal rapid alternating movement.   ASSESSMENT/PLAN BELLAMY JUDSON is a 75 y.o. male with PMH significant for hyperlipidemia, hypertension, GERD, aortic valve replacement who presented with transient right facial numbness and facial droop and was found to have a left frontoparietal infarct. Symptoms had resolved and NIHHS 0 at time of admission. He did not receive tPA.   Multifocal left MCA territory infarcts due to symptomatic high-grade proximal left ICA stenosis . Residual deficits are expressive aphasia Code Stroke CT head: left tempoparietal junction infarct acute vs subacute MRI brain: multifocal acute infarcts within the posterior left MCA territory Carotid 61: minimal right ICA stenosis. 80-89% stenosis of left ICA. Normal vertebral flow IR angiogram:  Preocclusive severe proximal left ICA stenosis 2D Echo left ventricular ejection fraction 60 to 65%.   LDL 91 HgbA1c 6.4 DAPT with Brilinta and aspirin following elective left carotid stent therapy recommendations:  outpatient SLP and OT Disposition:  pending ongoing evaluation  Hypertension Now outside the window for permissive hypertension. Blood pressure goal pending angiogram findings.   Hyperlipidemia LDL 91, goal < 70 Increase Lipitor to 40 mg daily  Other Stroke Risk Factors Advanced Age >/= 53  Former tobacco user, quit in 2003 CAD. No chest pain on admission. Recent  abnormal stress test. He was supposed to undergo a LHC 7/21 however will need to reschedule   Hospital day # 0  Elige Radon, MD Internal Medicine Resident PGY-3 [facility] Pager: [phone] 03/26/2021 1:11 PM    Stroke MD note:  I have personally obtained history,examined this patient, reviewed notes, independently viewed imaging studies, participated in medical decision making and plan of care.ROS completed by me personally and pertinent positives fully documented  I have made any additions or clarifications directly to the above note. Agree with note above.  Patient presented with sudden onset of expressive aphasia secondary to left MCA frontal branch infarct secondary to symptomatic severe preocclusive proximal left ICA stenosis.  He remains at significant risk for recurrent strokes and would benefit with early left carotid revascularization.  Discussed with patient and wife and Dr. Corliss Skains hopefully will schedule this in the next 1 to 2 days.  Recommend aspirin and Brilinta following his stent for several months and then aspirin alone.  Aggressive risk factor modification.  Discussed with Dr. Marshell Garfinkel endovascular.  Greater than 50% time during this 35-minute visit was spent on counseling and coordination of care about his stroke and symptomatic carotid stenosis and discussion about revascularization and answering questions  Delia Heady, MD Medical Director [facility] Pager: [phone] 03/26/2021 4:23 PM  To contact Stroke Continuity provider, please refer to [email or web address]. After hours, contact General Neurology

## 2021-03-26 NOTE — Progress Notes (Signed)
PROGRESS NOTE    Stephen Mullins  GGY:[id number] DOB: 1946-09-01 DOA: 03/25/2021 PCP: Soundra Pilon, FNP   Brief Narrative:  HPI: Stephen Mullins is a 75 y.o. 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  Patient had been in his usual state when he took a nap at approximately 12:30 PM after doing some yard work.  When he woke at 1:30 PM, his right lower face felt numb similar to when he has had a nerve block for dental procedures.  He smiled in a mirror and noted the right lower face to be drooping.  He was slurring his words due to the facial weakness.  Symptoms completely resolved within approximately 10 to 15 minutes and then shortly after return for approximately 1 minute.  By time of arrival in the ED, he reports feeling completely normal.  He denies any associated chest pain.  He reports longstanding palpitations that he attributes to frequent PVCs but has not noticed any change in this.  No recent fevers, chills, fall, or trauma.   ED Course: Upon arrival to the ED, patient is found to be afebrile, saturating well on room air, and with stable blood pressure.  EKG features sinus rhythm with PVC and ST depressions.  Head CT with focal hypodensity at the left temporoparietal junction consistent with area of edema most likely related to acute or subacute infarction.  Neurology was consulted by the ED physician and hospitalists asked to admit.  Assessment & Plan:   Principal Problem:   Acute ischemic stroke (HCC) Active Problems:   Pure hypercholesterolemia   Essential (primary) hypertension   Dilated aortic root (HCC)   CAD (coronary artery disease)   1. Acute ischemic CVA: -  Presents after an episode of dysarthria and right facial numbness and weakness that has resolved.  MRI confirms multifocal acute infarct within the posterior left MCA territory.  Patient with expressive aphasia/word finding difficulty.  Also complains of left visual deficit.  Carotid ultrasound shows minimal right ICA stenosis but 80 to 89% left ICA stenosis.  Neurology on board who has consulted radiology and patient is scheduled to have cerebral angiogram and possible stent.  Echo pending.  LDL 91.  Hemoglobin A1c 6.4.  He remains on aspirin and Plavix.  Neurology on board and we appreciate their help.  PT OT and SLP on board.   2. CAD: No anginal complaints. Had mild CAD in 2019, recent abnormal stress test and was planned for heart cath on 7/21. Continue antiplatelets, statin; no longer on beta-blocker d/t bradycardia     3. Hypertension: Out of the permissive hypertension window.  Awaiting further recommendations from neurology about blood pressure goal.   4. Hyperlipidemia  - Continue Lipitor     5. Thoracic aortic aneurysm  - Asymptomatic, continue CT surgery follow-up as planned    DVT prophylaxis: enoxaparin (LOVENOX) injection 40 mg Start: 03/25/21 2200   Code Status: Full Code  Family Communication: None present at bedside.  Plan of care discussed with patient in length and he verbalized understanding and agreed with it.  Status is: Inpatient  Remains inpatient appropriate because:Ongoing diagnostic testing needed not appropriate for outpatient work up  Dispo: The patient is from:  Home              Anticipated d/c is to: Home              Patient currently is not medically stable to d/c.   Difficult to place patient No        Estimated body mass index is 27.2 kg/m as calculated from the following:   Height as of this encounter:  (1.803 m).   Weight as of this encounter: 88.5 kg.      Nutritional status:               Consultants:  Neurology and  IR  Procedures:  None  Antimicrobials:  Anti-infectives (From admission, onward)    None          Subjective: Seen and examined this morning.  Clearly had expressive dysarthria and he was very frustrated because of that.  Also complained of having some visual deficit in the left eye that initially he stated that he started at the time other symptoms started but then in the middle of conversation, he mentioned that it started sometime in the middle of last night.  Objective: Vitals:   03/26/21 1015 03/26/21 1030 03/26/21 1045 03/26/21 1354  BP: (!) 145/107 (!) 148/74 (!) 152/76 (!) 165/72  Pulse: 70 65 67 74  Resp: 19 (!) Temp:      TempSrc:      SpO2: 98% 96% 96% 98%  Weight:      Height:       No intake or output data in the 24 hours ending 03/26/21 1357 Filed Weights   03/25/21 1521  Weight: 88.5 kg    Examination:  General exam: Appears anxious Respiratory system: Clear to auscultation. Respiratory effort normal. Cardiovascular system: S1 & S2 heard, RRR. No JVD, murmurs, rubs, gallops or clicks. No pedal edema. Gastrointestinal system: Abdomen is nondistended, soft and nontender. No organomegaly or masses felt. Normal bowel sounds heard. Central nervous system: Alert and oriented. No focal neurological deficits other than expressive dysarthria. Extremities: Symmetric 5 x 5 power. Skin: No rashes, lesions or ulcers Psychiatry: Judgement and insight appear normal. Mood & affect anxious   Data Reviewed: I have personally reviewed following labs and imaging studies  CBC: Recent Labs  Lab 03/24/21 1037 03/25/21 1536 03/25/21 1632 03/26/21 0345  WBC 6.9 8.3  --  7.1  NEUTROABS  --  5.6  --   --   HGB 15.4 15.4 15.6 14.6  HCT 45.9 46.5 46.0 43.4  MCV 89 89.8  --  88.6  PLT 239 250  --  215   Basic Metabolic Panel: Recent Labs  Lab 03/24/21 1037 03/25/21 1536 03/25/21 1632 03/26/21 0345  NA 139 139 141 137  K 4.3 4.2 4.1 3.8  CL 102 103  105 105  CO2 30* 28  --  27  GLUCOSE 121* 113* 108* 141*  BUN CREATININE 0.83 1.09 1.00 0.95  CALCIUM 10.5* 10.0  --  9.5   GFR: Estimated Creatinine Clearance: 71.6 mL/min (by C-G formula based on SCr of 0.95 mg/dL). Liver Function Tests: Recent Labs  Lab 03/25/21 1536  AST 23  ALT 24  ALKPHOS 43  BILITOT 0.6  PROT 6.8  ALBUMIN 4.0   No results for input(s): LIPASE, AMYLASE in the last 168 hours. No results for input(s): AMMONIA in the last 168 hours. Coagulation Profile: Recent Labs  Lab 03/25/21 1536  INR 1.1  Cardiac Enzymes: No results for input(s): CKTOTAL, CKMB, CKMBINDEX, TROPONINI in the last 168 hours. BNP (last 3 results) No results for input(s): PROBNP in the last 8760 hours. HbA1C: Recent Labs    03/26/21 0345  HGBA1C 6.4*   CBG: No results for input(s): GLUCAP in the last 168 hours. Lipid Profile: Recent Labs    03/26/21 0345  CHOL 154  HDL 39*  LDLCALC 91  TRIG [id number]  CHOLHDL 3.9   Thyroid Function Tests: No results for input(s): TSH, T4TOTAL, FREET4, T3FREE, THYROIDAB in the last 72 hours. Anemia Panel: No results for input(s): VITAMINB12, FOLATE, FERRITIN, TIBC, IRON, RETICCTPCT in the last 72 hours. Sepsis Labs: No results for input(s): PROCALCITON, LATICACIDVEN in the last 168 hours.  Recent Results (from the past 240 hour(s))  Resp Panel by RT-PCR (Flu A&B, Covid) Nasopharyngeal Swab     Status: None   Collection Time: 03/25/21  7:31 PM   Specimen: Nasopharyngeal Swab; Nasopharyngeal(NP) swabs in vial transport medium  Result Value Ref Range Status   SARS Coronavirus 2 by RT PCR NEGATIVE NEGATIVE Final    Comment: (NOTE) SARS-CoV-2 target nucleic acids are NOT DETECTED.  The SARS-CoV-2 RNA is generally detectable in upper respiratory specimens during the acute phase of infection. The lowest concentration of SARS-CoV-2 viral copies this assay can detect is 138 copies/mL. A negative result does not preclude  SARS-Cov-2 infection and should not be used as the sole basis for treatment or other patient management decisions. A negative result may occur with  improper specimen collection/handling, submission of specimen other than nasopharyngeal swab, presence of viral mutation(s) within the areas targeted by this assay, and inadequate number of viral copies(<138 copies/mL). A negative result must be combined with clinical observations, patient history, and epidemiological information. The expected result is Negative.  Fact Sheet for Patients:  BloggerCourse.comhttps://www.fda.gov/media/152166/download  Fact Sheet for Healthcare Providers:  SeriousBroker.ithttps://www.fda.gov/media/152162/download  This test is no t yet approved or cleared by the [place] FDA and  has been authorized for detection and/or diagnosis of SARS-CoV-2 by FDA under an Emergency Use Authorization (EUA). This EUA will remain  in effect (meaning this test can be used) for the duration of the COVID-19 declaration under Section 564(b)(1) of the Act, 21 U.S.C.section 360bbb-3(b)(1), unless the authorization is terminated  or revoked sooner.       Influenza A by PCR NEGATIVE NEGATIVE Final   Influenza B by PCR NEGATIVE NEGATIVE Final    Comment: (NOTE) The Xpert Xpress SARS-CoV-2/FLU/RSV plus assay is intended as an aid in the diagnosis of influenza from Nasopharyngeal swab specimens and should not be used as a sole basis for treatment. Nasal washings and aspirates are unacceptable for Xpert Xpress SARS-CoV-2/FLU/RSV testing.  Fact Sheet for Patients: BloggerCourse.comhttps://www.fda.gov/media/152166/download  Fact Sheet for Healthcare Providers: SeriousBroker.ithttps://www.fda.gov/media/152162/download  This test is not yet approved or cleared by the [place] FDA and has been authorized for detection and/or diagnosis of SARS-CoV-2 by FDA under an Emergency Use Authorization (EUA). This EUA will remain in effect (meaning this test can be used) for the duration of  the COVID-19 declaration under Section 564(b)(1) of the Act, 21 U.S.C. section 360bbb-3(b)(1), unless the authorization is terminated or revoked.  Performed at [facility], [address] [place], [place] [address]       Radiology Studies: CT HEAD WO CONTRAST  Result Date: 03/25/2021 CLINICAL DATA:  Possible TIA EXAM: CT HEAD WITHOUT CONTRAST TECHNIQUE: Contiguous axial images were obtained from the base of the skull through the  vertex without intravenous contrast. COMPARISON:  None. FINDINGS: Brain: No hemorrhage is visualized. Rounded area of hypodensity at the left temporoparietal junction consistent with edema. No midline shift or significant mass effect. Mild atrophy. Nonenlarged ventricles. Vascular: No hyperdense vessels.  Carotid vascular calcification Skull: Normal. Negative for fracture or focal lesion. Sinuses/Orbits: Mucosal thickening in the sinuses Other: None IMPRESSION: 1. Focal hypodensity at the left temporoparietal junction consistent with an area of edema, probably due to small acute or subacute infarct. No definite hemorrhage at this time. 2. Atrophy Electronically Signed   By: Jasmine Pang M.D.   On: 03/25/2021 16:17   MR ANGIO HEAD WO CONTRAST  Result Date: 03/25/2021 CLINICAL DATA:  Neuro deficit, acute, stroke suspected; Stroke, follow up EXAM: MRI HEAD WITHOUT CONTRAST MRA HEAD WITHOUT CONTRAST TECHNIQUE: Multiplanar, multi-echo pulse sequences of the brain and surrounding structures were acquired without intravenous contrast. Angiographic images of the Circle of Willis were acquired using MRA technique without intravenous contrast. COMPARISON:  No pertinent prior exam. FINDINGS: MRI HEAD FINDINGS Brain: Multifocal abnormal diffusion restriction posterior left MCA territory. No acute or chronic hemorrhage. Mild edema in the left parietal lobe at one of the larger infarct sites. Mild generalized volume loss. The midline structures are normal. Vascular: Major flow  voids are preserved. Skull and upper cervical spine: Normal calvarium and skull base. Visualized upper cervical spine and soft tissues are normal. Sinuses/Orbits:No paranasal sinus fluid levels or advanced mucosal thickening. No mastoid or middle ear effusion. Normal orbits. MRA HEAD FINDINGS POSTERIOR CIRCULATION: --Vertebral arteries: Normal --Inferior cerebellar arteries: Normal. --Basilar artery: Normal. --Superior cerebellar arteries: Normal. --Posterior cerebral arteries: Normal. ANTERIOR CIRCULATION: --Intracranial internal carotid arteries: Normal. --Anterior cerebral arteries (ACA): Normal. --Middle cerebral arteries (MCA): Normal. ANATOMIC VARIANTS: None IMPRESSION: 1. 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 No hemorrhage or mass effect. 2. Normal intracranial MRA. Electronically Signed   By: Deatra Robinson M.D.   On: 03/25/2021 21:50   MR BRAIN WO CONTRAST  Result Date: 03/25/2021 CLINICAL DATA:  Neuro deficit, acute, stroke suspected; Stroke, follow up EXAM: MRI HEAD WITHOUT CONTRAST MRA HEAD WITHOUT CONTRAST TECHNIQUE: Multiplanar, multi-echo pulse sequences of the brain and surrounding structures were acquired without intravenous contrast. Angiographic images of the Circle of Willis were acquired using MRA technique without intravenous contrast. COMPARISON:  No pertinent prior exam. FINDINGS: MRI HEAD FINDINGS Brain: Multifocal abnormal diffusion restriction posterior left MCA territory. No acute or chronic hemorrhage. Mild edema in the left parietal lobe at one of the larger infarct sites. Mild generalized volume loss. The midline structures are normal. Vascular: Major flow voids are preserved. Skull and upper cervical spine: Normal calvarium and skull base. Visualized upper cervical spine and soft tissues are normal. Sinuses/Orbits:No paranasal sinus fluid levels or advanced mucosal thickening. No mastoid or middle ear effusion. Normal orbits. MRA HEAD FINDINGS POSTERIOR  CIRCULATION: --Vertebral arteries: Normal --Inferior cerebellar arteries: Normal. --Basilar artery: Normal. --Superior cerebellar arteries: Normal. --Posterior cerebral arteries: Normal. ANTERIOR CIRCULATION: --Intracranial internal carotid arteries: Normal. --Anterior cerebral arteries (ACA): Normal. --Middle cerebral arteries (MCA): Normal. ANATOMIC VARIANTS: None IMPRESSION: 1. 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 No hemorrhage or mass effect. 2. Normal intracranial MRA. Electronically Signed   By: Deatra Robinson M.D.   On: 03/25/2021 21:50   VAS US CAROTID (at [facility] and [facility] only)  Result Date: 03/26/2021 Carotid Arterial Duplex Study Patient Name:  Stephen Mullins  Date of Exam:   03/26/2021 Medical Rec #: [id number]  Accession #:    [id number] Date of Birth: 1946-01-29      Patient Gender: M Patient Age:   77Y Exam Location:  [facility] Procedure:      VAS US CAROTID Referring Phys: [id number] TIMOTHY S OPYD --------------------------------------------------------------------------------  Indications:      CVA and Speech disturbance. Risk Factors:     Hypertension, hyperlipidemia, Diabetes, past history of                   smoking. Comparison Study: 12/16/2017- pre-CABG Dopplers: bilateral 1-39% ICA stenosis. Performing Technologist: Gertie Fey MHA, RDMS, RVT, RDCS  Examination Guidelines: A complete evaluation includes B-mode imaging, spectral Doppler, color Doppler, and power Doppler as needed of all accessible portions of each vessel. Bilateral testing is considered an integral part of a complete examination. Limited examinations for reoccurring indications may be performed as noted.  Right Carotid Findings: +----------+-------+--------+--------+-----------------------+-----------------+           PSV    EDV cm/sStenosisPlaque Description     Comments                    cm/s                                                             +----------+-------+--------+--------+-----------------------+-----------------+ CCA Prox  80     16                                                       +----------+-------+--------+--------+-----------------------+-----------------+ CCA Distal73     15                                                       +----------+-------+--------+--------+-----------------------+-----------------+ ICA Prox  50     9                                      intimal                                                                   thickening        +----------+-------+--------+--------+-----------------------+-----------------+ ICA Distal74     20              smooth and heterogenous                  +----------+-------+--------+--------+-----------------------+-----------------+ ECA       195    23              heterogenous and  calcific                                 +----------+-------+--------+--------+-----------------------+-----------------+ +----------+--------+-------+----------------+-------------------+           PSV cm/sEDV cmsDescribe        Arm Pressure (mmHG) +----------+--------+-------+----------------+-------------------+ Subclavian160            Multiphasic, WNL                    +----------+--------+-------+----------------+-------------------+ +---------+--------+--+--------+--+---------+ VertebralPSV cm/s57EDV cm/s11Antegrade +---------+--------+--+--------+--+---------+  Left Carotid Findings: +----------+--------+--------+--------+------------------+--------+           PSV cm/sEDV cm/sStenosisPlaque DescriptionComments +----------+--------+--------+--------+------------------+--------+ CCA Prox  80      11                                         +----------+--------+--------+--------+------------------+--------+ CCA Distal70      16                                          +----------+--------+--------+--------+------------------+--------+ ICA Prox  386     125     80-99%                             +----------+--------+--------+--------+------------------+--------+ ICA Mid   255     40                                         +----------+--------+--------+--------+------------------+--------+ ICA Distal163     26                                         +----------+--------+--------+--------+------------------+--------+ ECA       229     27                                         +----------+--------+--------+--------+------------------+--------+ +----------+--------+--------+----------------+-------------------+           PSV cm/sEDV cm/sDescribe        Arm Pressure (mmHG) +----------+--------+--------+----------------+-------------------+ [id number]             Multiphasic, WNL                    +----------+--------+--------+----------------+-------------------+ +---------+--------+--+--------+--+---------+ VertebralPSV cm/s58EDV cm/s13Antegrade +---------+--------+--+--------+--+---------+   Summary: Right Carotid: Velocities in the right ICA are consistent with a 1-39% stenosis. Left Carotid: Velocities in the left ICA are consistent with a 80-99% stenosis.               Significant progression of disease when compared to previous study               12/16/2017. Vertebrals:  Bilateral vertebral arteries demonstrate antegrade flow. Subclavians: Normal flow hemodynamics were seen in bilateral subclavian              arteries. *See table(s) above for measurements and observations.     Preliminary     Scheduled Meds:  aspirin EC  81 mg Oral q AM   atorvastatin  10 mg Oral q AM   clopidogrel  75 mg Oral Daily   enoxaparin (LOVENOX) injection  40 mg Subcutaneous Q24H   lidocaine       Continuous Infusions:   LOS: 0 days   Time spent: 34 minutes   Hughie Closs, MD [facility]  03/26/2021, 1:57 PM   How to  contact the [facility] Attending or Consulting provider 7A - 7P or covering provider during after hours 7P -7A, for this patient?  Check the care team in [facility] and look for a) attending/consulting [facility] provider listed and b) the [facility] team listed. Page or secure chat 7A-7P. Log into [email or web address] and use [facility]'s universal password to access. If you do not have the password, please contact the hospital operator. Locate the [facility] provider you are looking for under [facility] and page to a number that you can be directly reached. If you still have difficulty reaching the provider, please page the [facility] (Director on Call) for the Hospitalists listed on amion for assistance.

## 2021-03-26 NOTE — Progress Notes (Signed)
Carotid artery duplex completed. Refer to "CV Proc" under chart review to view preliminary results.  Critical results discussed with Dr. Pearlean Brownie.  03/26/2021 11:24 AM Eula Fried., MHA, RVT, RDCS, RDMS

## 2021-03-26 NOTE — Progress Notes (Signed)
Occupational Therapy Evaluation Patient Details Name: Stephen Mullins MRN: [id number] DOB: 21-Dec-1945 Today's Date: 03/26/2021    History of Present Illness 75 yo aditted with transient R facial numbness, R facial droop and R sided weakness. 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 PMH significant for hyperlipidemia, diabetes, hypertension, GERD, aortic valve replacement.   Clinical Impression   Pt reports increased difficulty with speech and vision since admission last night. Significant expressive language deficits in addition to mild incoordination and apparent R field visual deficits in addition to deficits listed below. Recommend follow up with OT at the neuro outpt center. Will follow acutely.    Follow Up Recommendations  Outpatient OT (neuro outpt; Direct S with medicaiton and financial management; REfrain from driving)    Equipment Recommendations  None recommended by OT    Recommendations for Other Services       Precautions / Restrictions Precautions Precautions: Fall      Mobility Bed Mobility Overal bed mobility: Modified Independent                  Transfers Overall transfer level: Needs assistance   Transfers: Sit to/from Stand Sit to Stand: Min assist         General transfer comment: unsteady; improved with mobility. Pt reaching out to stabilize    Balance Overall balance assessment: Needs assistance   Sitting balance-Leahy Scale: Good       Standing balance-Leahy Scale: Fair                             ADL either performed or assessed with clinical judgement   ADL Overall ADL's : Needs assistance/impaired         Upper Body Bathing: Set up   Lower Body Bathing: Min guard;Sit to/from stand   Upper Body Dressing : Set up   Lower Body Dressing: Min guard;Sit to/from stand   Toilet Transfer: Minimal assistance   Toileting- Architect and Hygiene: Min guard        Functional mobility during ADLs: Minimal assistance;Cueing for safety General ADL Comments: unsteady; Will need direct assistance with medicaitonandfinancial management     Vision Baseline Vision/History: Wears glasses Wears Glasses: At all times Patient Visual Report: Other (comment) (visual field impairment) Vision Assessment?: Yes Eye Alignment: Within Functional Limits Ocular Range of Motion: Within Functional Limits Alignment/Gaze Preference: Within Defined Limits Tracking/Visual Pursuits: Able to track stimulus in all quads without difficulty Saccades: Additional head turns occurred during testing Convergence: Within functional limits Visual Fields: Right visual field deficit (with L eye per report) Additional Comments: When R eye closed, pt reports a "blank spot" occluding therapists eye, which would be in his R nasal visual field     Perception Perception Comments: difficultywith clock draw; wrote numbers incorrectly initially then self corrected   Praxis      Pertinent Vitals/Pain Pain Assessment: No/denies pain     Hand Dominance Right   Extremity/Trunk Assessment Upper Extremity Assessment Upper Extremity Assessment: RUE deficits/detail RUE Deficits / Details: mild incoordination deficits; unaware of dropping item RUE Coordination: decreased fine motor   Lower Extremity Assessment Lower Extremity Assessment: Defer to PT evaluation   Cervical / Trunk Assessment Cervical / Trunk Assessment: Normal   Communication Communication Communication: Expressive difficulties   Cognition Arousal/Alertness: Awake/alert Behavior During Therapy: Anxious Overall Cognitive Status: Impaired/Different from baseline Area of Impairment: Attention;Memory;Safety/judgement;Problem solving  Current Attention Level: Selective Memory: Decreased short-term memory   Safety/Judgement: Decreased awareness of safety     General Comments: Difficult to assess  dueto language deficits   General Comments  Pt asked abaout getting his morning meds @ 10 times during session    Exercises     Shoulder Instructions      Home Living Family/patient expects to be discharged to:: Private residence Living Arrangements: Spouse/significant other Available Help at Discharge: Family;Available 24 hours/day Type of Home: House Home Access: Stairs to enter [employer] of Steps: 3 Entrance Stairs-Rails: None Home Layout: One level     Bathroom Shower/Tub: IT trainer: Standard Bathroom Accessibility: Yes How Accessible: Accessible via walker Home Equipment: Grab bars - tub/shower;Shower seat;Cane - single point;Walker - 2 wheels;Bedside commode          Prior Functioning/Environment Level of Independence: Independent        Comments: drives        OT Problem List: Impaired balance (sitting and/or standing);Impaired vision/perception;Decreased coordination;Decreased cognition;Decreased safety awareness;Decreased knowledge of use of DME or AE;Impaired UE functional use      OT Treatment/Interventions: Self-care/ADL training;Therapeutic exercise;Neuromuscular education;DME and/or AE instruction;Visual/perceptual remediation/compensation;Therapeutic activities;Cognitive remediation/compensation;Patient/family education;Balance training    OT Goals(Current goals can be found in the care plan section) Acute Rehab OT Goals Patient Stated Goal: to get his medicaiton OT Goal Formulation: With patient Time For Goal Achievement: 04/09/21 Potential to Achieve Goals: Good  OT Frequency: Min 2X/week   Barriers to D/C:            Co-evaluation              AM-PAC OT "6 Clicks" Daily Activity     Outcome Measure Help from another person eating meals?: None Help from another person taking care of personal grooming?: A Little Help from another person toileting, which includes using toliet, bedpan, or  urinal?: A Little Help from another person bathing (including washing, rinsing, drying)?: A Little Help from another person to put on and taking off regular upper body clothing?: A Little Help from another person to put on and taking off regular lower body clothing?: A Little 6 Click Score: 19   End of Session Equipment Utilized During Treatment: Gait belt Nurse Communication: Mobility status;Other (comment) (concerns regarding vision and speech changes)  Activity Tolerance: Patient tolerated treatment well Patient left: in bed;with call bell/phone within reach;with family/visitor present  OT Visit Diagnosis: Unsteadiness on feet (R26.81);Other abnormalities of gait and mobility (R26.89);Muscle weakness (generalized) (M62.81);Low vision, both eyes (H54.2);Other symptoms and signs involving cognitive function                Time: 5361-4431 OT Time Calculation (min): 30 min Charges:  OT General Charges $OT Visit: 1 Visit OT Evaluation $OT Eval Moderate Complexity: 1 Mod OT Treatments $Self Care/Home Management : 8-22 mins  Luisa Dago, OT/L   Acute OT Clinical Specialist Acute Rehabilitation Services Pager [phone] Office [phone]   [facility] 03/26/2021, 9:50 AM

## 2021-03-26 NOTE — ED Notes (Signed)
Pt had difficulty telling this RN he had a hard time seeing out of his left eye upon rising this morning to take his morning meds. NIH 1 d/t expressive aphasia, provider came to bedside after medication administration. OT also came to bedside following provider & noticed pt having increased expressive symptoms since yesterday. OT reached out to [facility], MD following these collective findings.

## 2021-03-26 NOTE — ED Notes (Addendum)
OT at bedside. 

## 2021-03-26 NOTE — Evaluation (Signed)
Speech Language Pathology Evaluation Patient Details Name: CHRLES SELLEY MRN: [id number] DOB: [birth year] Today's Date: 03/26/2021 Time: 7867-6720 SLP Time Calculation (min) (ACUTE ONLY): 32 min  Problem List:  Patient Active Problem List   Diagnosis Date Noted   Acute ischemic stroke (HCC) 03/25/2021   CAD (coronary artery disease) 03/25/2021   Encounter for follow-up for aortic valve replacement 07/03/2020   S/P aortic valve replacement with bioprosthetic valve 12/20/2017   Plantar fasciitis 06/25/2017   Onychomycosis 04/27/2017   Pure hypercholesterolemia 10/25/2015   Essential (primary) hypertension 10/25/2015   Cardiac murmur 10/25/2015   Impaired fasting glucose 10/25/2015   Congenital insufficiency of aortic valve 10/25/2015   Bicuspid aortic valve 10/25/2015   Dilated aortic root (HCC) 10/25/2015   Aortic stenosis 10/25/2015   Aortic regurgitation 10/25/2015   Hyperlipidemia 10/25/2015   Family history of malignant neoplasm of prostate 09/20/2015   Encounter for screening for other disorder 09/20/2015   Past Medical History:  Past Medical History:  Diagnosis Date   Bicuspid aortic valve    with aortic stenosis and mild insufficiency, mild to moderate aortic root dilitation   Borderline hyperlipidemia    Diabetes mellitus without complication (HCC)    Elevated fasting glucose    Elevated PSA    due to prostatitis   GERD (gastroesophageal reflux disease)    Heart murmur    Hypertension    Nail fungus    Thoracic ascending aortic aneurysm [facility])    Past Surgical History:  Past Surgical History:  Procedure Laterality Date   AORTIC VALVE REPLACEMENT N/A 12/20/2017   Procedure: AORTIC VALVE REPLACEMENT (AVR);  Surgeon: [name], [name], MD;  Location: [facility] OR;  Service: Open Heart Surgery;  Laterality: N/A;   CATARACT EXTRACTION, BILATERAL     RIGHT/LEFT HEART CATH AND CORONARY ANGIOGRAPHY N/A 11/09/2017   Procedure: RIGHT/LEFT HEART CATH AND CORONARY ANGIOGRAPHY;   Surgeon: [name], MD;  Location: [facility] INVASIVE CV LAB;  Service: Cardiovascular;  Laterality: N/A;   TEE WITHOUT CARDIOVERSION N/A 12/20/2017   Procedure: TRANSESOPHAGEAL ECHOCARDIOGRAM (TEE);  Surgeon: [name], [name], MD;  Location: [facility] OR;  Service: Open Heart Surgery;  Laterality: N/A;   HPI:  KENSLEY LARES is a 75 y.o. male who presented to the ED after an episode of right facial numbness and weakness.  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 Pt was assessed using the Western Aphasia Battery - Bedside Form (see below for additional information).  Pt exhibits awareness of and frustration with errors.  Pt's spontaneous speech is more fluent than SLP directed speech.  Pt exhibited semantic paraphasias.  Pt demonstrated some ability to utilize circumlocution.  Could not state address, but was able to provided neigborhood in which he lives.  Pt could not name object, but could describe its use, in which description, he inadvertently named the object as well. Pt performed relatively well on confrontational naming task (85% accuracy), but has difficulty constructing sentences.  Pt also had no difficulty with word repetition, but did have difficulty with sentence repetition.  Despite increasing difficutly with utterences of increasing length, there  was no evidence of apraxia.  Pt's articulation is clear as well. Writing was briefly assessed. Pt wrote his name with 8 of 11 letters correct, but was unable to write address.  Reading and oral motor  function were not assessed this date as pt needed to transport to vascular lab.  Pt would benefit from continue ST services in house and at next level of care to address aphasia.  WESTERN APHASIA BATTERY - BEDSIDE FORM Content: 6/10 Fluency: 4/10 Y/N Questions: 10/10 Commands: 5/10 Repetition: 4.5/10 Object Naming: 8.5/10 Bedside Aphasia Score: 63/100  Reading: not assessed Writing: 0/10     SLP Assessment  SLP Recommendation/Assessment: Patient needs continued Speech Lanaguage Pathology Services SLP Visit Diagnosis: Aphasia (R47.01)    Follow Up Recommendations  Home health SLP;Outpatient SLP;Inpatient Rehab    Frequency and Duration min 2x/week  2 weeks      SLP Evaluation Cognition  Overall Cognitive Status: Difficult to assess Orientation Level: Oriented X4       Comprehension  Auditory Comprehension Overall Auditory Comprehension: Impaired Yes/No Questions: Within Functional Limits Commands: Impaired Two Step Basic Commands: 50-74% accurate Multistep Basic Commands: 75-100% accurate EffectiveTechniques: Repetition Visual Recognition/Discrimination Discrimination: Not tested Reading Comprehension Reading Status: Not tested    Expression Expression Primary Mode of Expression: Verbal Verbal Expression Overall Verbal Expression: Impaired Automatic Speech: Social Response;Name Repetition: Impaired Level of Impairment: Phrase level Naming: Impairment Responsive: 76-100% accurate Confrontation: Impaired Verbal Errors: Semantic paraphasias;Aware of errors Pragmatics: No impairment Effective Techniques: Semantic cues (Circumlocution) Written Expression Dominant Hand: Right Written Expression: Exceptions to [facility] Self Formulation Ability: Letter;Word   Oral / Motor  Motor Speech Overall Motor Speech: Appears within functional limits for tasks assessed Respiration: Within functional limits Phonation: Normal Resonance: Within functional limits Articulation:  Within functional limitis Intelligibility: Intelligible Motor Planning: Witnin functional limits Motor Speech Errors: Not applicable   GO                    Kerrie Pleasure, MA, CCC-SLP Acute Rehabilitation Services Office: [phone] 03/26/2021, 11:18 AM

## 2021-03-26 NOTE — Progress Notes (Signed)
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 They are agreeable to procedure. Will coordinate with anesthesia to do procedure tomorrow. Have discontinued Plavix and started Brilinta 180 mg loading dose now, followed by additional 90 mg tonight and tomorrow am. Continue ASA as ordered. NPO p MN   Brayton El PA-C Interventional Radiology 03/26/2021 3:13 PM

## 2021-03-26 NOTE — ED Notes (Signed)
Pt ambulated to bathroom 

## 2021-03-26 NOTE — Progress Notes (Signed)
Chief Complaint: Patient was seen in consultation today for cerebral arteriogram   Referring Physician(s): Dr. Pearlean Brownie  Supervising Physician: Julieanne Cotton  Patient Status: [facility] - In-pt  History of Present Illness: Stephen Mullins is a 75 y.o. male admitted with slurred speech and right facial droop. The facial droop has resolved but pt remains with expressive aphasia.  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 NIR is asked to eval for formal carotid and cerebral arteriogram. PMHx, meds, labs, imaging, allergies reviewed. Has been started on Plavix and ASA as of yesterday. Feels better except frustrated with his expressive aphasia. Has been NPO today since about 0800. Wife at bedside.   Past Medical History:  Diagnosis Date   Bicuspid aortic valve    with aortic stenosis and mild insufficiency, mild to moderate aortic root dilitation   Borderline hyperlipidemia    Diabetes mellitus without complication (HCC)    Elevated fasting glucose    Elevated PSA    due to prostatitis   GERD (gastroesophageal reflux disease)    Heart murmur    Hypertension    Nail fungus    Thoracic ascending aortic aneurysm [facility])     Past Surgical History:  Procedure Laterality Date   AORTIC VALVE REPLACEMENT N/A 12/20/2017   Procedure: AORTIC VALVE REPLACEMENT (AVR);  Surgeon: [name], [name], MD;  Location: [facility] OR;  Service: Open Heart Surgery;  Laterality: N/A;   CATARACT EXTRACTION, BILATERAL     RIGHT/LEFT HEART CATH AND CORONARY ANGIOGRAPHY N/A 11/09/2017   Procedure: RIGHT/LEFT HEART CATH AND CORONARY ANGIOGRAPHY;  Surgeon: [name], MD;  Location: [facility] INVASIVE CV LAB;  Service: Cardiovascular;  Laterality: N/A;   TEE WITHOUT CARDIOVERSION N/A 12/20/2017   Procedure: TRANSESOPHAGEAL ECHOCARDIOGRAM (TEE);  Surgeon: [name], [name], MD;  Location: [facility] OR;  Service: Open Heart  Surgery;  Laterality: N/A;    Allergies: Tetanus toxoid, adsorbed  Medications:  Current Facility-Administered Medications:    acetaminophen (TYLENOL) tablet 650 mg, 650 mg, Oral, Q4H PRN **OR** acetaminophen (TYLENOL) 160 MG/5ML solution 650 mg, 650 mg, Per Tube, Q4H PRN **OR** acetaminophen (TYLENOL) suppository 650 mg, 650 mg, Rectal, Q4H PRN, Opyd, Lavone Neri, MD   aspirin EC tablet 81 mg, 81 mg, Oral, q AM, Opyd, Lavone Neri, MD, 81 mg at 03/26/21 0742   atorvastatin (LIPITOR) tablet 10 mg, 10 mg, Oral, q AM, Opyd, Lavone Neri, MD, 10 mg at 03/26/21 0747   clopidogrel (PLAVIX) tablet 75 mg, 75 mg, Oral, Daily, Opyd, Lavone Neri, MD, 75 mg at 03/26/21 0825   enoxaparin (LOVENOX) injection 40 mg, 40 mg, Subcutaneous, Q24H, Opyd, Lavone Neri, MD, 40 mg at 03/26/21 0744   senna-docusate (Senokot-S) tablet 1 tablet, 1 tablet, Oral, QHS PRN, Opyd, Lavone Neri, MD  Current Outpatient Medications:    acetaminophen (TYLENOL) 500 MG tablet, Take 2 tablets (1,000 mg total) by mouth every 6 (six) hours as needed. (Patient taking differently: Take 1,000 mg by mouth every 6 (six) hours as needed (PAIN).), Disp: 30 tablet, Rfl: 0   amoxicillin (AMOXIL) 500 MG capsule, TAKE 4 TABLETS BY MOUTH 1 HOUR BEFORE DENTAL PROCEDURE (Patient taking differently: Take 2,000 mg by mouth See admin instructions. TAKE 4 TABLETS (2000 MG) BY MOUTH 1 HOUR BEFORE DENTAL PROCEDURE), Disp: 4 capsule, Rfl: 3   aspirin EC 81 MG tablet, Take 81 mg by mouth in the morning. Swallow whole., Disp: , Rfl:    atorvastatin (LIPITOR)  10 MG tablet, Take 10 mg by mouth in the morning., Disp: , Rfl:    Efinaconazole 10 % SOLN, Apply 1 drop topically daily. (Patient taking differently: Apply 1 drop topically daily. APPLIED TO TOE NAILS), Disp: 4 mL, Rfl: 11   hydrochlorothiazide (HYDRODIURIL) 12.5 MG tablet, Take 1 tablet by mouth once daily (Patient taking differently: Take 12.5 mg by mouth in the morning.), Disp: 90 tablet, Rfl: 3   hydrocortisone  cream 1 %, Apply 1 application topically daily as needed for itching., Disp: , Rfl:    lisinopril (ZESTRIL) 5 MG tablet, Take 5 mg by mouth in the morning., Disp: , Rfl:    neomycin-bacitracin-polymyxin (NEOSPORIN) ointment, Apply 1 application topically daily as needed for wound care., Disp: , Rfl:    oxymetazoline (AFRIN) 0.05 % nasal spray, Place 1 spray into both nostrils daily as needed for congestion., Disp: , Rfl:    NONFORMULARY OR COMPOUNDED ITEM, [facility]: Antifungal nail lacquer - Fluconazole 2%, Terbinafine 1%, DMSO. (Patient not taking: No sig reported), Disp: 120 each, Rfl: 3    Family History  Problem Relation Age of Onset   Prostate cancer Father     Social History   Socioeconomic History   Marital status: Married    Spouse name: Not on file   Number of children: Not on file   Years of education: Not on file   Highest education level: Not on file  Occupational History   Occupation: retired  Tobacco Use   Smoking status: Former    Types: Cigarettes    Quit date: 10/20/2001    Years since quitting: 19.4   Smokeless tobacco: Never  Vaping Use   Vaping Use: Never used  Substance and Sexual Activity   Alcohol use: Yes    Alcohol/week: 1.0 - 2.0 standard drink    Types: 1 - 2 Glasses of wine per week   Drug use: No   Sexual activity: Not on file  Other Topics Concern   Not on file  Social History Narrative   Married 55 yrs (as of July 2022). Enjoys golf.     Social Determinants of Health   Financial Resource Strain: Not on file  Food Insecurity: Not on file  Transportation Needs: Not on file  Physical Activity: Not on file  Stress: Not on file  Social Connections: Not on file    Review of Systems: A 12 point ROS discussed and pertinent positives are indicated in the HPI above.  All other systems are negative.  Review of Systems  Vital Signs: BP (!) 152/76   Pulse 67   Temp 99.4 F (37.4 C) (Oral)   Resp 19   Ht 5\' 11"  (1.803 m)   Wt 88.5  kg   SpO2 96%   BMI 27.20 kg/m   Physical Exam Constitutional:      General: He is not in acute distress.    Appearance: He is not ill-appearing.  HENT:     Mouth/Throat:     Mouth: Mucous membranes are moist.     Pharynx: Oropharynx is clear.  Cardiovascular:     Rate and Rhythm: Normal rate and regular rhythm.     Pulses: Normal pulses.     Heart sounds: Normal heart sounds.  Pulmonary:     Effort: Pulmonary effort is normal. No respiratory distress.     Breath sounds: Normal breath sounds.  Skin:    General: Skin is warm and dry.  Neurological:     Comments: Awake and  alert PERRLA, EOMI Face symmetric, tongue midline No drift. Fine motor intact Grip strength 5+ and equal.      Imaging: CT HEAD WO CONTRAST  Result Date: 03/25/2021 CLINICAL DATA:  Possible TIA EXAM: CT HEAD WITHOUT CONTRAST TECHNIQUE: Contiguous axial images were obtained from the base of the skull through the vertex without intravenous contrast. COMPARISON:  None. FINDINGS: Brain: No hemorrhage is visualized. Rounded area of hypodensity at the left temporoparietal junction consistent with edema. No midline shift or significant mass effect. Mild atrophy. Nonenlarged ventricles. Vascular: No hyperdense vessels.  Carotid vascular calcification Skull: Normal. Negative for fracture or focal lesion. Sinuses/Orbits: Mucosal thickening in the sinuses Other: None IMPRESSION: 1. Focal hypodensity at the left temporoparietal junction consistent with an area of edema, probably due to small acute or subacute infarct. No definite hemorrhage at this time. 2. Atrophy Electronically Signed   By: Jasmine Pang M.D.   On: 03/25/2021 16:17   MR ANGIO HEAD WO CONTRAST  Result Date: 03/25/2021 CLINICAL DATA:  Neuro deficit, acute, stroke suspected; Stroke, follow up EXAM: MRI HEAD WITHOUT CONTRAST MRA HEAD WITHOUT CONTRAST TECHNIQUE: Multiplanar, multi-echo pulse sequences of the brain and surrounding structures were acquired  without intravenous contrast. Angiographic images of the Circle of Willis were acquired using MRA technique without intravenous contrast. COMPARISON:  No pertinent prior exam. FINDINGS: MRI HEAD FINDINGS Brain: Multifocal abnormal diffusion restriction posterior left MCA territory. No acute or chronic hemorrhage. Mild edema in the left parietal lobe at one of the larger infarct sites. Mild generalized volume loss. The midline structures are normal. Vascular: Major flow voids are preserved. Skull and upper cervical spine: Normal calvarium and skull base. Visualized upper cervical spine and soft tissues are normal. Sinuses/Orbits:No paranasal sinus fluid levels or advanced mucosal thickening. No mastoid or middle ear effusion. Normal orbits. MRA HEAD FINDINGS POSTERIOR CIRCULATION: --Vertebral arteries: Normal --Inferior cerebellar arteries: Normal. --Basilar artery: Normal. --Superior cerebellar arteries: Normal. --Posterior cerebral arteries: Normal. ANTERIOR CIRCULATION: --Intracranial internal carotid arteries: Normal. --Anterior cerebral arteries (ACA): Normal. --Middle cerebral arteries (MCA): Normal. ANATOMIC VARIANTS: None IMPRESSION: 1. 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 No hemorrhage or mass effect. 2. Normal intracranial MRA. Electronically Signed   By: Deatra Robinson M.D.   On: 03/25/2021 21:50   MR BRAIN WO CONTRAST  Result Date: 03/25/2021 CLINICAL DATA:  Neuro deficit, acute, stroke suspected; Stroke, follow up EXAM: MRI HEAD WITHOUT CONTRAST MRA HEAD WITHOUT CONTRAST TECHNIQUE: Multiplanar, multi-echo pulse sequences of the brain and surrounding structures were acquired without intravenous contrast. Angiographic images of the Circle of Willis were acquired using MRA technique without intravenous contrast. COMPARISON:  No pertinent prior exam. FINDINGS: MRI HEAD FINDINGS Brain: Multifocal abnormal diffusion restriction posterior left MCA territory. No acute or chronic  hemorrhage. Mild edema in the left parietal lobe at one of the larger infarct sites. Mild generalized volume loss. The midline structures are normal. Vascular: Major flow voids are preserved. Skull and upper cervical spine: Normal calvarium and skull base. Visualized upper cervical spine and soft tissues are normal. Sinuses/Orbits:No paranasal sinus fluid levels or advanced mucosal thickening. No mastoid or middle ear effusion. Normal orbits. MRA HEAD FINDINGS POSTERIOR CIRCULATION: --Vertebral arteries: Normal --Inferior cerebellar arteries: Normal. --Basilar artery: Normal. --Superior cerebellar arteries: Normal. --Posterior cerebral arteries: Normal. ANTERIOR CIRCULATION: --Intracranial internal carotid arteries: Normal. --Anterior cerebral arteries (ACA): Normal. --Middle cerebral arteries (MCA): Normal. ANATOMIC VARIANTS: None IMPRESSION: 1. 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 No hemorrhage or mass effect. 2.  Normal intracranial MRA. Electronically Signed   By: Deatra Robinson M.D.   On: 03/25/2021 21:50   EXERCISE TOLERANCE TEST (ETT)  Result Date: 03/18/2021  Blood pressure demonstrated a normal response to exercise.  ST segment depression of 5 mm was noted during stress in the II, III, aVF, V5 and V6 leads.  ETT with fair exercise tolerance (6:42); no chest pain but patient did complain of dyspnea; normal blood pressure response; 3 beats nonsustained ventricular tachycardia and frequent PVCs with exercise and 5 beats nonsustained ventricular tachycardia in recovery; 4 to 5 mm of ST depression in the inferior lateral leads and 3 mm ST elevation in AvR; abnormal ETT. Results discussed with Dr Anne Fu.   VAS US CAROTID (at [facility] and [facility] only)  Result Date: 03/26/2021 Carotid Arterial Duplex Study Patient Name:  Stephen Mullins  Date of Exam:   03/26/2021 Medical Rec #: [id number]       Accession #:    [id number] Date of Birth: 1945/10/12      Patient Gender: M Patient Age:   25Y  Exam Location:  [facility] Procedure:      VAS US CAROTID Referring Phys: [id number] Mullins S OPYD --------------------------------------------------------------------------------  Indications:      CVA and Speech disturbance. Risk Factors:     Hypertension, hyperlipidemia, Diabetes, past history of                   smoking. Comparison Study: 12/16/2017- pre-CABG Dopplers: bilateral 1-39% ICA stenosis. Performing Technologist: Gertie Fey MHA, RDMS, RVT, RDCS  Examination Guidelines: A complete evaluation includes B-mode imaging, spectral Doppler, color Doppler, and power Doppler as needed of all accessible portions of each vessel. Bilateral testing is considered an integral part of a complete examination. Limited examinations for reoccurring indications may be performed as noted.  Right Carotid Findings: +----------+-------+--------+--------+-----------------------+-----------------+           PSV    EDV cm/sStenosisPlaque Description     Comments                    cm/s                                                            +----------+-------+--------+--------+-----------------------+-----------------+ CCA Prox  80     16                                                       +----------+-------+--------+--------+-----------------------+-----------------+ CCA Distal73     15                                                       +----------+-------+--------+--------+-----------------------+-----------------+ ICA Prox  50     9                                      intimal  thickening        +----------+-------+--------+--------+-----------------------+-----------------+ ICA Distal74     20              smooth and heterogenous                  +----------+-------+--------+--------+-----------------------+-----------------+ ECA       195    23              heterogenous and                                                           calcific                                 +----------+-------+--------+--------+-----------------------+-----------------+ +----------+--------+-------+----------------+-------------------+           PSV cm/sEDV cmsDescribe        Arm Pressure (mmHG) +----------+--------+-------+----------------+-------------------+ Subclavian160            Multiphasic, WNL                    +----------+--------+-------+----------------+-------------------+ +---------+--------+--+--------+--+---------+ VertebralPSV cm/s57EDV cm/s11Antegrade +---------+--------+--+--------+--+---------+  Left Carotid Findings: +----------+--------+--------+--------+------------------+--------+           PSV cm/sEDV cm/sStenosisPlaque DescriptionComments +----------+--------+--------+--------+------------------+--------+ CCA Prox  80      11                                         +----------+--------+--------+--------+------------------+--------+ CCA Distal70      16                                         +----------+--------+--------+--------+------------------+--------+ ICA Prox  386     125     80-99%                             +----------+--------+--------+--------+------------------+--------+ ICA Mid   255     40                                         +----------+--------+--------+--------+------------------+--------+ ICA Distal163     26                                         +----------+--------+--------+--------+------------------+--------+ ECA       229     27                                         +----------+--------+--------+--------+------------------+--------+ +----------+--------+--------+----------------+-------------------+           PSV cm/sEDV cm/sDescribe        Arm Pressure (mmHG) +----------+--------+--------+----------------+-------------------+ [id number]             Multiphasic, WNL                     +----------+--------+--------+----------------+-------------------+ +---------+--------+--+--------+--+---------+  VertebralPSV cm/s58EDV cm/s13Antegrade +---------+--------+--+--------+--+---------+   Summary: Right Carotid: Velocities in the right ICA are consistent with a 1-39% stenosis. Left Carotid: Velocities in the left ICA are consistent with a 80-99% stenosis.               Significant progression of disease when compared to previous study               12/16/2017. Vertebrals:  Bilateral vertebral arteries demonstrate antegrade flow. Subclavians: Normal flow hemodynamics were seen in bilateral subclavian              arteries. *See table(s) above for measurements and observations.     Preliminary     Labs:  CBC: Recent Labs    12/05/20 1107 03/24/21 1037 03/25/21 1536 03/25/21 1632 03/26/21 0345  WBC 7.5 6.9 8.3  --  7.1  HGB 16.1 15.4 15.4 15.6 14.6  HCT 48.5 45.9 46.5 46.0 43.4  PLT 269 239 250  --  215    COAGS: Recent Labs    03/25/21 1536  INR 1.1  APTT 32    BMP: Recent Labs    03/24/21 1037 03/25/21 1536 03/25/21 1632 03/26/21 0345  NA 139 139 141 137  K 4.3 4.2 4.1 3.8  CL 102 103 105 105  CO2 30* 28  --  27  GLUCOSE 121* 113* 108* 141*  BUN 15 16 19 14   CALCIUM 10.5* 10.0  --  9.5  CREATININE 0.83 1.09 1.00 0.95  GFRNONAA  --  >60  --  >60    LIVER FUNCTION TESTS: Recent Labs    09/03/20 1051 10/16/20 1046 12/05/20 1107 03/25/21 1536  BILITOT 0.5 0.6 0.5 0.6  AST 19 25 15 23   ALT 23 26 17 24   ALKPHOS 60 56  --  43  PROT 7.1 6.9 7.2 6.8  ALBUMIN 4.6 4.3  --  4.0    TUMOR MARKERS: No results for input(s): AFPTM, CEA, CA199, CHROMGRNA in the last 8760 hours.  Assessment and Plan: (L)MCA infarcts with critical (L)ICA stenosis. Plan for diagnostic carotid/cerebral arteriogram today. Meds, labs reviewed. Risks and benefits of carotid/cerebral angiogram were discussed with the patient including, but not limited to  bleeding, infection, vascular injury or contrast induced renal failure.  This interventional procedure involves the use of X-rays and because of the nature of the planned procedure, it is possible that we will have prolonged use of X-ray fluoroscopy.  Potential radiation risks to you include (but are not limited to) the following: - A slightly elevated risk for cancer  several years later in life. This risk is typically less than 0.5% percent. This risk is low in comparison to the normal incidence of human cancer, which is 33% for women and 50% for men according to the [employer]. - Radiation induced injury can include skin redness, resembling a rash, tissue breakdown / ulcers and hair loss (which can be temporary or permanent).   The likelihood of either of these occurring depends on the difficulty of the procedure and whether you are sensitive to radiation due to previous procedures, disease, or genetic conditions.   IF your procedure requires a prolonged use of radiation, you will be notified and given written instructions for further action.  It is your responsibility to monitor the irradiated area for the 2 weeks following the procedure and to notify your physician if you are concerned that you have suffered a radiation induced injury.    All of the patient's questions were answered,  patient is agreeable to proceed.  Consent signed and in chart.     Thank you for this interesting consult.  I greatly enjoyed meeting TIMOHTY RENBARGER and look forward to participating in their care.  A copy of this report was sent to the requesting provider on this date.  Electronically Signed: Brayton El, PA-C 03/26/2021, 11:36 AM   I spent a total of 25 minutes in face to face in clinical consultation, greater than 50% of which was counseling/coordinating care for symptomatic (L)carotid stenosis.

## 2021-03-26 NOTE — ED Notes (Signed)
ECHO at bedside.

## 2021-03-27 ENCOUNTER — Encounter (HOSPITAL_COMMUNITY): Admission: RE | Payer: Self-pay | Source: Home / Self Care

## 2021-03-27 ENCOUNTER — Ambulatory Visit (HOSPITAL_COMMUNITY): Admission: RE | Admit: 2021-03-27 | Payer: Medicare HMO | Source: Home / Self Care | Admitting: Cardiovascular Disease

## 2021-03-27 ENCOUNTER — Other Ambulatory Visit (HOSPITAL_COMMUNITY): Payer: Self-pay

## 2021-03-27 ENCOUNTER — Other Ambulatory Visit (HOSPITAL_COMMUNITY): Payer: Medicare HMO

## 2021-03-27 ENCOUNTER — Encounter (HOSPITAL_COMMUNITY): Payer: Self-pay | Admitting: Family Medicine

## 2021-03-27 DIAGNOSIS — I639 Cerebral infarction, unspecified: Secondary | ICD-10-CM | POA: Diagnosis not present

## 2021-03-27 LAB — BASIC METABOLIC PANEL
Anion gap: 8 (ref 5–15)
BUN: 12 mg/dL (ref 8–23)
CO2: 20 mmol/L — ABNORMAL LOW (ref 22–32)
Calcium: 9.2 mg/dL (ref 8.9–10.3)
Chloride: 108 mmol/L (ref 98–111)
Creatinine, Ser: 0.9 mg/dL (ref 0.61–1.24)
GFR, Estimated: >60 mL/min (ref >60)
Glucose, Bld: 134 mg/dL — ABNORMAL HIGH (ref 70–99)
Potassium: 4 mmol/L (ref 3.5–5.1)
Sodium: 136 mmol/L (ref 135–145)

## 2021-03-27 LAB — CBG MONITORING, ED: Glucose-Capillary: 130 mg/dL — ABNORMAL HIGH (ref 70–99)

## 2021-03-27 LAB — MRSA NEXT GEN BY PCR, NASAL: MRSA by PCR Next Gen: NOT DETECTED

## 2021-03-27 IMAGING — XA IR INTRAVSC STENT CERV CAROTID W/ EMB-PROT
8 of 20 series · 8 of 24 positions shown · IV contrast (IODINE)
Comparison: Recent diagnostic arteriogram [DATE].

CLINICAL DATA: Symptomatic severe pre occlusive stenosis of the
left internal carotid artery proximally. Left parietal ischemic
stroke on MRI of the brain

EXAM:
INTRAVSC STENT CERV CAROTID W/ EMB-PROT
TECHNIQUE: Informed written consent was obtained from the patient after a
thorough discussion of the procedural risks, benefits and
alternatives. All questions were addressed. Maximal Sterile Barrier
Technique was utilized including caps, mask, sterile gowns, sterile
gloves, sterile drape, hand hygiene and skin antiseptic. A timeout
was performed prior to the initiation of the procedure.

[Series 2: cerebral care 2 · 2 acquisitions, 1 frame shown]
[im 1/2]
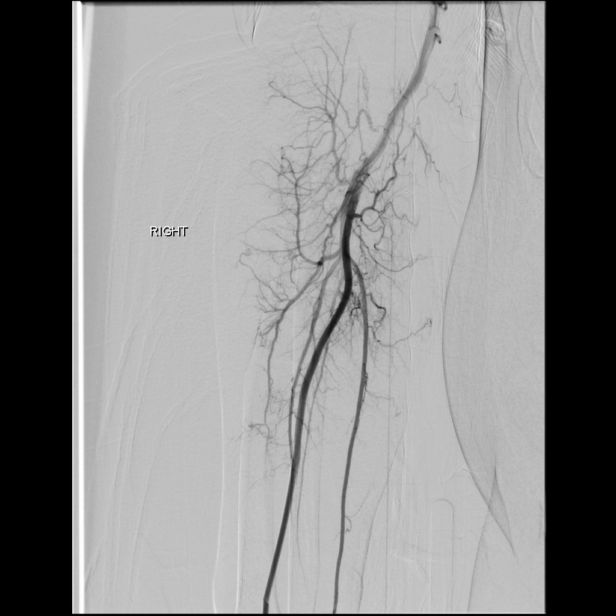

[Series 4: cerebral · 2 acquisitions, 1 frame shown (1 of 6)]
[im 1/2]
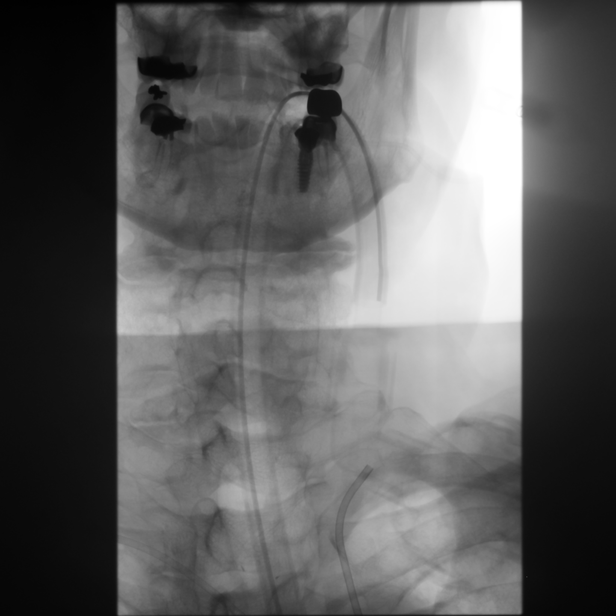

[Series 7: cerebral · 4 acquisitions, 1 frame shown (2 of 6)]
[im 1/4]
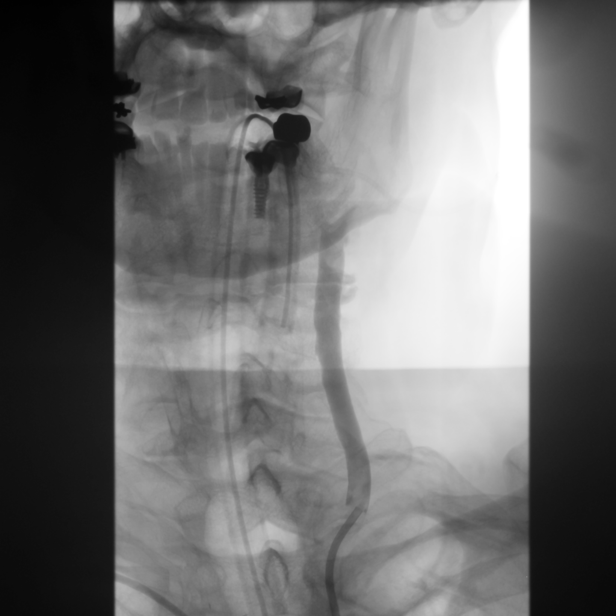

[Series 9: cerebral · 2 acquisitions, 1 frame shown (3 of 6)]
[im 1/2]
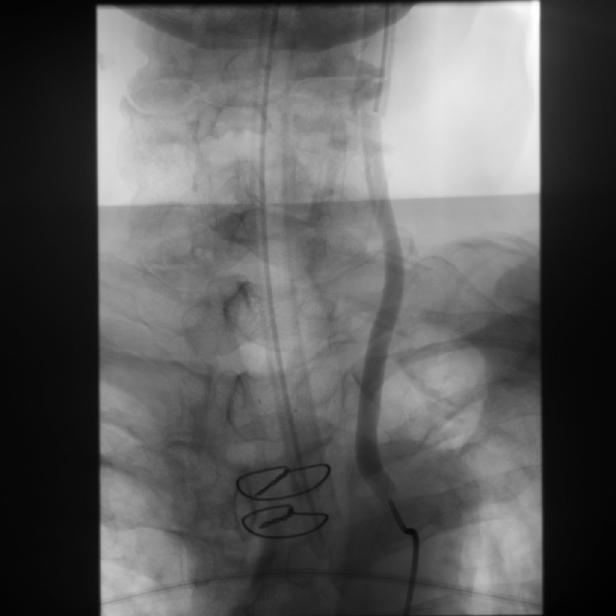

[Series 11: cerebral · 2 acquisitions, 1 frame shown (4 of 6)]
[im 1/2]
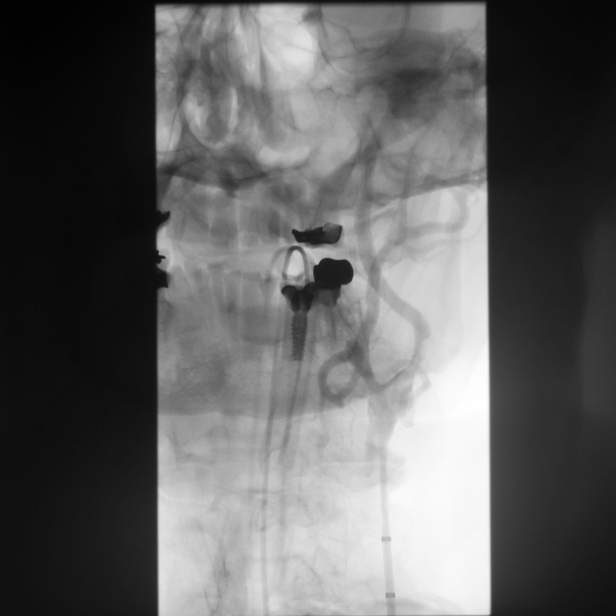

[Series 15: cerebral · 2 acquisitions, 1 frame shown (5 of 6)]
[im 1/2]
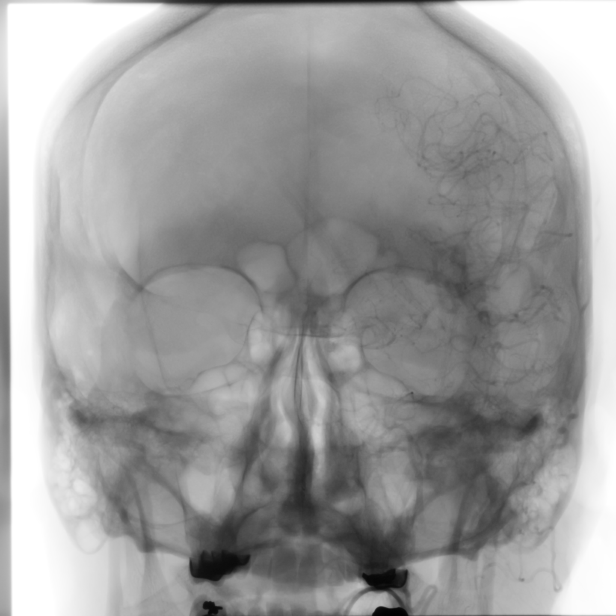

[Series 17: cerebral · 2 acquisitions, 1 frame shown (6 of 6)]
[im 1/2]
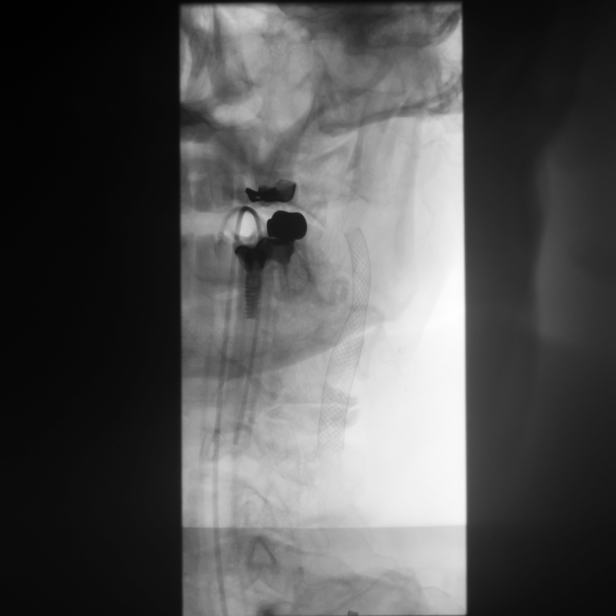

[Series 20: <mpr range>2 · axial · 5.0mm · 0.32mm/px · 1 of 36 slices shown]
[im 1/36]
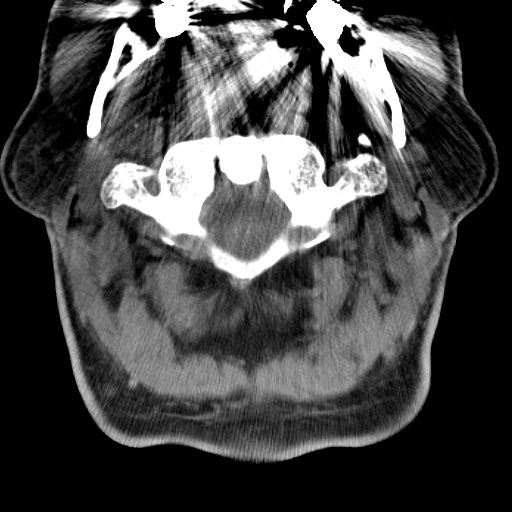

[8 of 24 positions shown; findings below may reference images not displayed]

MEDICATIONS:
Heparin 4,000 units IV. Ancef 2 g IV antibiotic was administered
within 1 hour of the procedure.

ANESTHESIA/SEDATION:
General anesthesia.

CONTRAST:  Omnipaque 300 approximately 100 mL.

FLUOROSCOPY TIME:  Fluoroscopy Time: 62 minutes 0 seconds ([0R]
mGy).

COMPLICATIONS:
None immediate.
The right forearm to the wrist was prepped and draped in the usual
sterile manner.

The right radial artery was then identified with ultrasound, and its
morphology documented. A dorsal palmar anastomosis was verified to
be present.

Using ultrasound guidance, access into the right radial artery was
obtained over a 0.018 inch micro guidewire.

A [DATE] radial sheath was then inserted. The obturator, and micro
guidewire were removed. A cocktail of 100 units of heparin, and
mg of verapamil, and 200 mcg of nitroglycerin was then infused
through the sheath in diluted form without event. A right radial
arteriogram was then performed.

Over a 0.035 inch Roadrunner guidewire, a 95 cm 6 French Envoy
MARKOWITZ guide catheter was then advanced to the aortic arch region,
and selectively positioned in the left common carotid artery without
difficulty. Guidewire was removed. Good aspiration was obtained from
the hub of the 6 French Envoy MARKOWITZ 2 guide catheter in the left
common carotid artery.

Arteriograms then performed extra cranially and intracranially.
FINDINGS: The left common carotid arteriogram again demonstrates the left
external carotid origin to be narrowed by 50%. Its branches are
normally opacified.

The left internal carotid artery just distal to the bulb again
demonstrates a severe high-grade stenosis associated with a mild
ulceration and filling defect along the posterolateral wall. More
distally there is advancement of contrast to the cranial skull base.

The petrous, cavernous and supraclinoid segments are widely patent
with mild arteriosclerotic changes involving the proximal cavernous
segment.

The left middle cerebral artery opacifies into the capillary and
venous phases with a delayed focal occlusion of a distal M4 branch
of the inferior division.

Transient antegrade filling is seen of the right anterior cerebral
A1 segment.

ENDOVASCULAR REVASCULARIZATION OF LEFT INTERNAL CAROTID PROXIMALLY
WITH DISTAL PROTECTION.

Measurements were then performed of the left internal carotid artery
at the cranial skull base, and also just distal to the high-grade
stenosis and the distal left common carotid artery.

A [DATE] NAV Emboshield protection device was prepped with heparinized
saline infusion in its housing. The filter end was cleared of air as
well as the delivery catheter. The filter was then locked into the
filter delivery device catheter. The 014 inch guidewire, and the
filter delivery apparatus were removed. A J configuration was given
to the 0.014 inch guidewire.

The delivery apparatus with the 014 inch micro guidewire was then
gently advanced using biplane roadmap technique and constant
fluoroscopic guidance in a coaxial manner and with constant
heparinized saline infusion to the distal end of the Envoy guide
catheter. With the micro guidewire leading, with a torque device,
the tight stenosis was then crossed without difficulty followed by
the delivery filter apparatus with the micro guidewire to the
horizontal petrous segment of the left internal carotid artery.

The filter was then deployed in the usual manner by retrieving the
delivery microcatheter while stabilizing the delivery micro
guidewire and the left internal carotid artery distally.

Control arteriogram performed through the MARKOWITZ 2 guide catheter
in the left common carotid artery demonstrated no change in the
intracranial or extracranial circulation.

At this time, a 4 mm x 30 mm Viatrac balloon catheter was prepped
antegradely and retrogradely with 50% contrast and 50% heparinized
saline infusion, and with heparinized saline solution respectively.

Thereafter using the rapid exchange technique, the balloon was then
advanced without difficulty and positioned with the distal and
proximal marker balloon at the appropriate endpoint.

Control inflation was then performed using micro inflation syringe
device via micro tubing to just over normal atmospheres where it was
maintained for approximately 20 seconds. The balloon was then
deflated and removed with the filter being stable in the distal left
internal carotid artery. Control arteriogram performed through the 6
French Envoy guide catheter in the left common carotid artery
demonstrates significantly improved caliber and flow through the
left internal carotid artery proximally and distally.

A control arteriogram performed distally intracranially demonstrated
no evidence of spasms, dissections or of intraluminal filling
defects.

An 8 mm x 36 mm Wallstent delivery apparatus was then prepped and
purged with heparinized saline infusion.

With constant heparinized saline infusion, the delivery apparatus of
the stent was then advanced.

However, the delivery apparatus could not be advanced into the left
common carotid artery at the aortic arch due to the tortuosity, and
the anatomy involved near force advancement continued to result in
herniation of the filter apparatus more proximally.

This approach was then abandoned.

The filter capture microcatheter was reintroduced to the proximal
marker of the filter and retrieved and removed.

The 6 French Envoy MARKOWITZ guide catheter, with the filter device
were retrieved and removed. A wrist band was then applied at the
right radial puncture site for hemostasis.

The right groin was then prepped and draped in the usual sterile
manner. Thereafter, common femoral artery access was obtained using
a micropuncture set over a 0.035 inch guidewire. Over the 0.035 inch
J-tip guidewire, an 8 French 25 cm Pinnacle neurovascular sheath was
then inserted.

Over the guidewire, a JB 1 5 French diagnostic catheter was advanced
to the aortic arch region, and selectively positioned in the left
common carotid artery again over a 0.035 inch Roadrunner guidewire.
The guidewire was removed. Good aspiration obtained from the hub of
the 5 French JB 1 catheter. A gentle control arteriogram performed
through this demonstrated no evidence of spasms, dissections or of
intraluminal filling defects. Over a 0.035 inch 300 cm Rosen
exchange guidewire, an 087 balloon guide catheter was advanced and
positioned proximal to the left common carotid bifurcation. The
guidewire was removed. Good aspiration obtained from the hub of the
balloon guide catheter. Control arteriogram was then performed
extracranially where it continued to demonstrate significantly
improved caliber through the angioplastied left internal carotid
artery proximally with no change in the intracranial circulation.

A [DATE] French NAV Emboshield was then prepped and purged with
heparinized saline infusion and captured into the delivery apparatus
and prepped as described above.

The combination of the 0.014 inch micro guidewire, with the filter
delivery apparatus was again advanced to the distal end of the
balloon guide catheter.

Using a torque device, wire access with the microcatheter was then
obtained without difficulty into the proximal petrous segment. The
filter device was then deployed in the usual manner as described
above without difficulty.

Control arteriogram performed through the balloon guide catheter
demonstrates safe positioning of the tip of the filter apparatus
without evidence of vasospasm, dissection or of intraluminal filling
defects.

The prepped Wallstent 8 mm x 36 mm apparatus was then advanced in a
coaxial manner with constant heparinized saline infusion using the
rapid exchange technique to the distal end of the balloon guide
catheter. It was then positioned with the distal and proximal
markers at the desired position.

The stent was then deployed in the usual manner without difficulty.

The delivery apparatus was removed. A control arteriogram performed
through the balloon guide catheter in the left common carotid artery
demonstrated excellent apposition and flow extra cranially and
intracranially.

Intracranially no intraluminal filling defects or occlusion was
seen. There was improved hemodynamic flow into the left middle
cerebral artery and the left anterior cerebral artery distribution.

The filter capture device was then advanced under fluoroscopic
guidance to the proximal marker on the filter. Filter was then
captured into the capture device and removed under constant
fluoroscopic observation.

Control arteriograms were then performed through the balloon guide
catheter in the left common carotid artery over approximately 30
minutes. These continued to demonstrate excellent flow through the
stented segment and also intracranially.

Intraluminal filling defects.

However, the patient was given a total of 3 mg of Integrilin
intra-arterially.

A final control arteriogram performed through the balloon guide
catheter in the left common carotid artery demonstrated excellent
flow through the stented segment and also intracranially. The
balloon guide catheter was retrieved and removed. The 8 French
Pinnacle sheath in the right groin was then removed with successful
hemostasis achieved with a 6 French Angio-Seal closure device. The
right groin appeared soft. Distal pulses remained Dopplerable in
both feet.

A postprocedural CT of the brain demonstrated no evidence of
intracranial hemorrhage or mass effect or midline shift.

Patient's general anesthesia was reversed, and the patient was then
extubated. Upon recovery, the patient denied any headaches, nausea
or vomiting. He continued to have word-finding difficulties.
However, all four extremities moved equally without evidence of
pronation drift.

He was then transferred to the PACU and then the ICU for post
revascularization measures.

Overnight, the patient remained stable neurologically and
hemodynamically. The following morning, the IV heparin was stopped
and the patient was switched to aspirin 81 mg a day, and Brilinta 90
mg b.i.d.

His speech significantly improved although he continued to have
word-finding difficulties to a lesser degree compared to prior to
the procedure.

The right groin appeared soft. Distal pulses remained present. He
was then transferred to the admitting service. Patient's follow-up
will be in approximately 2 weeks post discharge. Patient was advised
to continue taking his dual antiplatelets, in addition to maintain
adequate hydration.

He was advised to refrain from stooping, bending or lifting weights
above 10 pounds for 2 weeks.

This was also conveyed to the patient's spouse who expressed
understanding.
IMPRESSION: Status post endovascular revascularization of pre occlusive
symptomatic severe stenosis of the left internal carotid artery
proximally with stent assisted angioplasty and distal protection.

PLAN:
Follow-up in the clinic 2 weeks post discharge.

## 2021-03-27 SURGERY — LEFT HEART CATH AND CORONARY ANGIOGRAPHY
Anesthesia: LOCAL

## 2021-03-27 MED ORDER — LACTATED RINGERS IV SOLN: INTRAVENOUS | Status: DC

## 2021-03-27 MED ORDER — HEPARIN SODIUM (PORCINE) 1000 UNIT/ML IJ SOLN
INTRAMUSCULAR | Status: AC
  Filled 2021-03-27: qty 1

## 2021-03-27 MED ORDER — VERAPAMIL HCL 2.5 MG/ML IV SOLN
INTRAVENOUS | Status: AC
  Filled 2021-03-27: qty 2

## 2021-03-27 MED ORDER — EPTIFIBATIDE 20 MG/10ML IV SOLN
INTRAVENOUS | Status: AC
  Filled 2021-03-27: qty 10

## 2021-03-27 MED ORDER — ASPIRIN EC 81 MG PO TBEC
81.0000 mg | DELAYED_RELEASE_TABLET | Freq: Every morning | ORAL | Status: DC
  Administered 2021-03-27 – 2021-03-28 (×2): 81 mg via ORAL
  Filled 2021-03-27 (×2): qty 1

## 2021-03-27 MED ORDER — LACTATED RINGERS IV SOLN: INTRAVENOUS | Status: DC | PRN

## 2021-03-27 MED ORDER — ORAL CARE MOUTH RINSE: 15.0000 mL | Freq: Once | OROMUCOSAL | Status: AC

## 2021-03-27 MED ORDER — TICAGRELOR 90 MG PO TABS
90.0000 mg | ORAL_TABLET | Freq: Two times a day (BID) | ORAL | Status: DC
  Administered 2021-03-27 – 2021-03-28 (×3): 90 mg via ORAL
  Filled 2021-03-27 (×2): qty 1

## 2021-03-27 MED ORDER — CHLORHEXIDINE GLUCONATE 0.12 % MT SOLN
OROMUCOSAL | Status: AC
  Administered 2021-03-27: 15 mL via OROMUCOSAL
  Filled 2021-03-27: qty 15

## 2021-03-27 MED ORDER — CHLORHEXIDINE GLUCONATE 0.12 % MT SOLN: 15.0000 mL | Freq: Once | OROMUCOSAL | Status: AC

## 2021-03-27 MED ORDER — CHLORHEXIDINE GLUCONATE CLOTH 2 % EX PADS
6.0000 | MEDICATED_PAD | Freq: Every day | CUTANEOUS | Status: DC
  Administered 2021-03-27 – 2021-03-29 (×2): 6 via TOPICAL

## 2021-03-27 NOTE — Anesthesia Preprocedure Evaluation (Addendum)
Anesthesia Evaluation  Patient identified by MRN, date of birth, ID band Patient awake    Reviewed: Allergy & Precautions, NPO status , Patient's Chart, lab work & pertinent test results, reviewed documented beta blocker date and time   Airway Mallampati: II  TM Distance: >3 FB Neck ROM: Full    Dental no notable dental hx. (+) Teeth Intact, Dental Advisory Given, Caps   Pulmonary neg pulmonary ROS, former smoker,    Pulmonary exam normal breath sounds clear to auscultation       Cardiovascular hypertension, Pt. on medications + CAD  Normal cardiovascular exam+ Valvular Problems/Murmurs AI and AS  Rhythm:Regular Rate:Normal  ECHO 03/26/21  1. Left ventricular ejection fraction, by estimation, is 60 to 65%. The left ventricle has normal function. The left ventricle has no regional wall motion abnormalities. There is mild left ventricular hypertrophy. Left ventricular diastolic parameters are consistent with Grade I diastolic dysfunction (impaired relaxation).  2. Right ventricular systolic function is normal. The right ventricular size is normal.  3. Left atrial size was mildly dilated.  4. The mitral valve is normal in structure. No evidence of mitral valve regurgitation. No evidence of mitral stenosis.  5. 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 Mean gradient 12 mmHg with no significant regurgitation.  6. Aortic dilatation noted. There is mild dilatation of the ascending aorta, measuring 41 mm.  7. The inferior vena cava is normal in size with greater than 50% respiratory variability, suggesting right atrial pressure of 3 mmHg.   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  ST segment depression of 5 mm was  noted during stress in the II, III, aVF, V5 and V6 leads  Cardiac Cath 11/09/2017  Prox RCA lesion is 30% stenosed.  Mid RCA lesion is 30% stenosed.  RPDA lesion is 30% stenosed.  Ost Cx to Mid Cx lesion is 20% stenosed.  Ost 1st Mrg lesion is 20% stenosed.  Mid LAD lesion is 20% stenosed.  Ost LM lesion is 20% stenosed.   1. Mild non-obstructive CAD 2. Severe aortic stenosis (mean gradient 35.6 mmHg, peak to peak gradient 36 mmHg, AVA 0.95 cm2).    Neuro/Psych Duplex finds critical stenosis of the left ICA. He underwent cerebral arteriogram yesterday which confirms critical (L)ICA stenosis.  Right sided weakness has resolved. His facial droop has resolved CVA, Residual Symptoms negative psych ROS   GI/Hepatic Neg liver ROS, GERD  Medicated,  Endo/Other  negative endocrine ROSdiabetes, Well Controlled, Type 2  Renal/GU negative Renal ROS  negative genitourinary   Musculoskeletal negative musculoskeletal ROS (+)   Abdominal   Peds negative pediatric ROS (+)  Hematology negative hematology ROS (+)   Anesthesia Other Findings   Reproductive/Obstetrics negative OB ROS                        Anesthesia Physical Anesthesia Plan  ASA: 3  Anesthesia Plan: General   Post-op Pain Management:    Induction: Intravenous  PONV Risk Score and Plan: 2 and Ondansetron  Airway Management Planned: Oral ETT  Additional Equipment: Arterial line  Intra-op Plan:   Post-operative Plan: Extubation in OR  Informed Consent:     Dental advisory given  Plan Discussed with: Anesthesiologist and CRNA  Anesthesia Plan Comments: (  )  Anesthesia Quick Evaluation  

## 2021-03-27 NOTE — Progress Notes (Signed)
PROGRESS NOTE    Stephen Mullins  NFA:[id number] DOB: 1946/01/08 DOA: 03/25/2021 PCP: Soundra Pilon, FNP   Brief Narrative:  HPI: Stephen Mullins is a 75 y.o. 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  Patient had been in his usual state when he took a nap at approximately 12:30 PM after doing some yard work.  When he woke at 1:30 PM, his right lower face felt numb similar to when he has had a nerve block for dental procedures.  He smiled in a mirror and noted the right lower face to be drooping.  He was slurring his words due to the facial weakness.  Symptoms completely resolved within approximately 10 to 15 minutes and then shortly after return for approximately 1 minute.  By time of arrival in the ED, he reports feeling completely normal.  He denies any associated chest pain.  He reports longstanding palpitations that he attributes to frequent PVCs but has not noticed any change in this.  No recent fevers, chills, fall, or trauma.   ED Course: Upon arrival to the ED, patient is found to be afebrile, saturating well on room air, and with stable blood pressure.  EKG features sinus rhythm with PVC and ST depressions.  Head CT with focal hypodensity at the left temporoparietal junction consistent with area of edema most likely related to acute or subacute infarction.  Neurology was consulted by the ED physician and hospitalists asked to admit.  Assessment & Plan:   Principal Problem:   Acute ischemic stroke (HCC) Active Problems:   Pure hypercholesterolemia   Essential (primary) hypertension   Dilated aortic root (HCC)   CAD (coronary artery disease)   1. Acute ischemic CVA: -  Presents after an episode of dysarthria and right facial numbness and weakness that has resolved.  MRI confirms multifocal acute infarct within the posterior left MCA territory.  Patient with expressive aphasia/word finding difficulty.  Also complains of left visual deficit.  Carotid ultrasound shows minimal right ICA stenosis but 80 to 89% left ICA stenosis.  Neurology on board who has consulted radiology and patient was scheduled to have cerebral angiogram and possible stent however due to some emergency, Dr. Corliss Skains has canceled his procedure today, patient has been made aware.[name] LDL 91.  Hemoglobin A1c 6.4.  He remains on aspirin and Plavix.     2. CAD: No anginal complaints. Had mild CAD in 2019, recent abnormal stress test and was planned for heart cath on 7/21. Continue antiplatelets, statin; no longer on beta-blocker d/t bradycardia     3. Hypertension: Out of the permissive hypertension window.  Blood pressure goal pending angiogram and stent per neurology.   4. Hyperlipidemia  - Continue Lipitor     5. Thoracic aortic aneurysm  - Asymptomatic, continue CT surgery follow-up as planned.   DVT prophylaxis:    Code Status: Full Code  Family Communication: None present at bedside.  Plan of care discussed with patient in length and he verbalized understanding and agreed with it.  Status is: Inpatient  Remains inpatient appropriate because:Ongoing diagnostic testing needed not appropriate for outpatient work up  Dispo: The patient is from: Home  Anticipated d/c is to: Home              Patient currently is not medically stable to d/c.   Difficult to place patient No        Estimated body mass index is 27.2 kg/m as calculated from the following:   Height as of this encounter: 5\' 11"  (1.803 m).   Weight as of this encounter: 88.5 kg.      Nutritional status:               Consultants:  Neurology and IR  Procedures:  None  Antimicrobials:   Anti-infectives (From admission, onward)    None          Subjective: Patient seen and examined.  Continues to have expressive aphasia, word finding difficulty.  Remains frustrated, slightly calmer today.  No other complaint.  Objective: Vitals:   03/27/21 1041 03/27/21 1131 03/27/21 1155 03/27/21 1515  BP: 130/90  (!) 163/65   Pulse: 67     Resp: (!) 21  16   Temp:  99.1 F (37.3 C) (!) 100.5 F (38.1 C)   TempSrc:  Oral Oral Oral  SpO2: 94%  98%   Weight:      Height:        Intake/Output Summary (Last 24 hours) at 03/27/2021 1553 Last data filed at 03/26/2021 2048 Gross per 24 hour  Intake 303.91 ml  Output --  Net 303.91 ml   Filed Weights   03/25/21 1521  Weight: 88.5 kg    Examination:  General exam: Appears calm and comfortable  Respiratory system: Clear to auscultation. Respiratory effort normal. Cardiovascular system: S1 & S2 heard, RRR. No JVD, murmurs, rubs, gallops or clicks. No pedal edema. Gastrointestinal system: Abdomen is nondistended, soft and nontender. No organomegaly or masses felt. Normal bowel sounds heard. Central nervous system: Alert and oriented.  Expressive dysarthria, dysdiadochokinesia right-sided  Extremities: Symmetric 5 x 5 power. Skin: No rashes, lesions or ulcers.  Psychiatry: Judgement and insight appear normal. Mood & affect appropriate.    Data Reviewed: I have personally reviewed following labs and imaging studies  CBC: Recent Labs  Lab 03/24/21 1037 03/25/21 1536 03/25/21 1632 03/26/21 0345  WBC 6.9 8.3  --  7.1  NEUTROABS  --  5.6  --   --   HGB 15.4 15.4 15.6 14.6  HCT 45.9 46.5 46.0 43.4  MCV 89 89.8  --  88.6  PLT 239 250  --  215    Basic Metabolic Panel: Recent Labs  Lab 03/24/21 1037 03/25/21 1536 03/25/21 1632 03/26/21 0345 03/27/21 0500  NA 139 139 141 137 136  K 4.3 4.2 4.1 3.8 4.0  CL 102 103 105 105 108  CO2 30* 28  --  27 20*  GLUCOSE 121* 113* 108* 141* 134*  BUN 15 16 19 14 12    CREATININE 0.83 1.09 1.00 0.95 0.90  CALCIUM 10.5* 10.0  --  9.5 9.2    GFR: Estimated Creatinine Clearance: 75.5 mL/min (by C-G formula based on SCr of 0.9 mg/dL). Liver Function Tests: Recent Labs  Lab 03/25/21 1536  AST 23  ALT 24  ALKPHOS 43  BILITOT 0.6  PROT 6.8  ALBUMIN 4.0    No results for input(s): LIPASE, AMYLASE in the last 168 hours. No results for input(s): AMMONIA in the last 168 hours. Coagulation Profile: Recent Labs  Lab 03/25/21 1536  INR 1.1    Cardiac Enzymes: No results for input(s): CKTOTAL, CKMB, CKMBINDEX, TROPONINI  in the last 168 hours. BNP (last 3 results) No results for input(s): PROBNP in the last 8760 hours. HbA1C: Recent Labs    03/26/21 0345  HGBA1C 6.4*    CBG: Recent Labs  Lab 03/27/21 1130  GLUCAP 130*   Lipid Profile: Recent Labs    03/26/21 0345  CHOL 154  HDL 39*  LDLCALC 91  TRIG 798  CHOLHDL 3.9    Thyroid Function Tests: No results for input(s): TSH, T4TOTAL, FREET4, T3FREE, THYROIDAB in the last 72 hours. Anemia Panel: No results for input(s): VITAMINB12, FOLATE, FERRITIN, TIBC, IRON, RETICCTPCT in the last 72 hours. Sepsis Labs: No results for input(s): PROCALCITON, LATICACIDVEN in the last 168 hours.  Recent Results (from the past 240 hour(s))  Resp Panel by RT-PCR (Flu A&B, Covid) Nasopharyngeal Swab     Status: None   Collection Time: 03/25/21  7:31 PM   Specimen: Nasopharyngeal Swab; Nasopharyngeal(NP) swabs in vial transport medium  Result Value Ref Range Status   SARS Coronavirus 2 by RT PCR NEGATIVE NEGATIVE Final    Comment: (NOTE) SARS-CoV-2 target nucleic acids are NOT DETECTED.  The SARS-CoV-2 RNA is generally detectable in upper respiratory specimens during the acute phase of infection. The lowest concentration of SARS-CoV-2 viral copies this assay can detect is 138 copies/mL. A negative result does not preclude SARS-Cov-2 infection and should not be used as the sole basis for  treatment or other patient management decisions. A negative result may occur with  improper specimen collection/handling, submission of specimen other than nasopharyngeal swab, presence of viral mutation(s) within the areas targeted by this assay, and inadequate number of viral copies(<138 copies/mL). A negative result must be combined with clinical observations, patient history, and epidemiological information. The expected result is Negative.  Fact Sheet for Patients:  BloggerCourse.com  Fact Sheet for Healthcare Providers:  SeriousBroker.it  This test is no t yet approved or cleared by the [place] FDA and  has been authorized for detection and/or diagnosis of SARS-CoV-2 by FDA under an Emergency Use Authorization (EUA). This EUA will remain  in effect (meaning this test can be used) for the duration of the COVID-19 declaration under Section 564(b)(1) of the Act, 21 U.S.C.section 360bbb-3(b)(1), unless the authorization is terminated  or revoked sooner.       Influenza A by PCR NEGATIVE NEGATIVE Final   Influenza B by PCR NEGATIVE NEGATIVE Final    Comment: (NOTE) The Xpert Xpress SARS-CoV-2/FLU/RSV plus assay is intended as an aid in the diagnosis of influenza from Nasopharyngeal swab specimens and should not be used as a sole basis for treatment. Nasal washings and aspirates are unacceptable for Xpert Xpress SARS-CoV-2/FLU/RSV testing.  Fact Sheet for Patients: BloggerCourse.com  Fact Sheet for Healthcare Providers: SeriousBroker.it  This test is not yet approved or cleared by the [place] FDA and has been authorized for detection and/or diagnosis of SARS-CoV-2 by FDA under an Emergency Use Authorization (EUA). This EUA will remain in effect (meaning this test can be used) for the duration of the COVID-19 declaration under Section 564(b)(1) of the Act, 21  U.S.C. section 360bbb-3(b)(1), unless the authorization is terminated or revoked.  Performed at [facility], [address], [place], [place] [address]        Radiology Studies: MR ANGIO HEAD WO CONTRAST  Result Date: 03/25/2021 CLINICAL DATA:  Neuro deficit, acute, stroke suspected; Stroke, follow up EXAM: MRI HEAD WITHOUT CONTRAST MRA HEAD WITHOUT CONTRAST TECHNIQUE: Multiplanar, multi-echo pulse sequences of the brain and  surrounding structures were acquired without intravenous contrast. Angiographic images of the Circle of Willis were acquired using MRA technique without intravenous contrast. COMPARISON:  No pertinent prior exam. FINDINGS: MRI HEAD FINDINGS Brain: Multifocal abnormal diffusion restriction posterior left MCA territory. No acute or chronic hemorrhage. Mild edema in the left parietal lobe at one of the larger infarct sites. Mild generalized volume loss. The midline structures are normal. Vascular: Major flow voids are preserved. Skull and upper cervical spine: Normal calvarium and skull base. Visualized upper cervical spine and soft tissues are normal. Sinuses/Orbits:No paranasal sinus fluid levels or advanced mucosal thickening. No mastoid or middle ear effusion. Normal orbits. MRA HEAD FINDINGS POSTERIOR CIRCULATION: --Vertebral arteries: Normal --Inferior cerebellar arteries: Normal. --Basilar artery: Normal. --Superior cerebellar arteries: Normal. --Posterior cerebral arteries: Normal. ANTERIOR CIRCULATION: --Intracranial internal carotid arteries: Normal. --Anterior cerebral arteries (ACA): Normal. --Middle cerebral arteries (MCA): Normal. ANATOMIC VARIANTS: None IMPRESSION: 1. 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 No hemorrhage or mass effect. 2. Normal intracranial MRA. Electronically Signed   By: Deatra Robinson M.D.   On: 03/25/2021 21:50   MR BRAIN WO CONTRAST  Result Date: 03/25/2021 CLINICAL DATA:  Neuro deficit, acute, stroke  suspected; Stroke, follow up EXAM: MRI HEAD WITHOUT CONTRAST MRA HEAD WITHOUT CONTRAST TECHNIQUE: Multiplanar, multi-echo pulse sequences of the brain and surrounding structures were acquired without intravenous contrast. Angiographic images of the Circle of Willis were acquired using MRA technique without intravenous contrast. COMPARISON:  No pertinent prior exam. FINDINGS: MRI HEAD FINDINGS Brain: Multifocal abnormal diffusion restriction posterior left MCA territory. No acute or chronic hemorrhage. Mild edema in the left parietal lobe at one of the larger infarct sites. Mild generalized volume loss. The midline structures are normal. Vascular: Major flow voids are preserved. Skull and upper cervical spine: Normal calvarium and skull base. Visualized upper cervical spine and soft tissues are normal. Sinuses/Orbits:No paranasal sinus fluid levels or advanced mucosal thickening. No mastoid or middle ear effusion. Normal orbits. MRA HEAD FINDINGS POSTERIOR CIRCULATION: --Vertebral arteries: Normal --Inferior cerebellar arteries: Normal. --Basilar artery: Normal. --Superior cerebellar arteries: Normal. --Posterior cerebral arteries: Normal. ANTERIOR CIRCULATION: --Intracranial internal carotid arteries: Normal. --Anterior cerebral arteries (ACA): Normal. --Middle cerebral arteries (MCA): Normal. ANATOMIC VARIANTS: None IMPRESSION: 1. 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 No hemorrhage or mass effect. 2. Normal intracranial MRA. Electronically Signed   By: Deatra Robinson M.D.   On: 03/25/2021 21:50   ECHOCARDIOGRAM COMPLETE  Result Date: 03/26/2021    ECHOCARDIOGRAM REPORT   Patient Name:   Stace TRISTYN DEMAREST Date of Exam: 03/26/2021 Medical Rec #:  [id number]      Height:       71.0 in Accession #:    [id number]     Weight:       195.0 lb Date of Birth:  1946/03/11     BSA:          2.086 m Patient Age:    74 years       BP:           162/83 mmHg Patient Gender: M              HR:            56 bpm. Exam Location:  Inpatient Procedure: 2D Echo Indications:    stroke  History:        Patient has prior history of Echocardiogram examinations, most  recent 07/24/2020. CAD; Risk Factors:Dyslipidemia and                 Hypertension.                 Aortic Valve: valve is present in the aortic position.  Sonographer:    Delcie RochLauren Pennington Referring Phys: [id number] TIMOTHY S OPYD IMPRESSIONS  1. Left ventricular ejection fraction, by estimation, is 60 to 65%. The left ventricle has normal function. The left ventricle has no regional wall motion abnormalities. There is mild left ventricular hypertrophy. Left ventricular diastolic parameters are consistent with Grade I diastolic dysfunction (impaired relaxation).  2. Right ventricular systolic function is normal. The right ventricular size is normal.  3. Left atrial size was mildly dilated.  4. The mitral valve is normal in structure. No evidence of mitral valve regurgitation. No evidence of mitral stenosis.  5. 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 Mean gradient 12 mmHg with no significant regurgitation.  6. Aortic dilatation noted. There is mild dilatation of the ascending aorta, measuring 41 mm.  7. The inferior vena cava is normal in size with greater than 50% respiratory variability, suggesting right atrial pressure of 3 mmHg. FINDINGS  Left Ventricle: Left ventricular ejection fraction, by estimation, is 60 to 65%. The left ventricle has normal function. The left ventricle has no regional wall motion abnormalities. The left ventricular internal cavity size was normal in size. There is  mild left ventricular hypertrophy. Left ventricular diastolic parameters are consistent with Grade I diastolic dysfunction (impaired relaxation). Right Ventricle: The right ventricular size is normal. No increase in right ventricular wall thickness. Right ventricular systolic function is normal. Left Atrium: Left atrial size was mildly  dilated. Right Atrium: Right atrial size was normal in size. Pericardium: There is no evidence of pericardial effusion. Mitral Valve: The mitral valve is normal in structure. There is mild calcification of the mitral valve leaflet(s). Mild mitral annular calcification. No evidence of mitral valve regurgitation. No evidence of mitral valve stenosis. Tricuspid Valve: The tricuspid valve is normal in structure. Tricuspid valve regurgitation is not demonstrated. 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 Mean gradient 12 mmHg with no significant regurgitation. The aortic valve has been repaired/replaced. Aortic valve regurgitation is not visualized. Aortic valve mean gradient measures 12.0 mmHg. Aortic valve peak gradient measures 22.5 mmHg. Aortic valve area, by VTI measures 2.67 cm. There is a valve present in the aortic position. Pulmonic Valve: The pulmonic valve was normal in structure. Pulmonic valve regurgitation is trivial. Aorta: Aortic dilatation noted. There is mild dilatation of the ascending aorta, measuring 41 mm. Venous: The inferior vena cava is normal in size with greater than 50% respiratory variability, suggesting right atrial pressure of 3 mmHg. IAS/Shunts: No atrial level shunt detected by color flow Doppler.  LEFT VENTRICLE PLAX 2D LVIDd:         4.90 cm  Diastology LVIDs:         3.00 cm  LV e' medial:    10.10 cm/s LV PW:         1.20 cm  LV E/e' medial:  9.5 LV IVS:        1.40 cm  LV e' lateral:   11.90 cm/s LVOT diam:     2.10 cm  LV E/e' lateral: 8.1 LV SV:         122 LV SV Index:   59 LVOT Area:     3.46 cm  RIGHT VENTRICLE             IVC RV S prime:     18.60 cm/s  IVC diam: 1.40 cm TAPSE (M-mode): 1.8 cm LEFT ATRIUM             Index       RIGHT ATRIUM           Index LA diam:        4.50 cm 2.16 cm/m  RA Area:     16.50 cm LA Vol (A2C):   77.8 ml 37.30 ml/m RA Volume:   40.70 ml  19.51 ml/m LA Vol (A4C):   64.3 ml 30.82 ml/m LA Biplane Vol: 72.9 ml  34.95 ml/m  AORTIC VALVE AV Area (Vmax):    2.50 cm AV Area (Vmean):   2.62 cm AV Area (VTI):     2.67 cm AV Vmax:           237.00 cm/s AV Vmean:          155.500 cm/s AV VTI:            0.457 m AV Peak Grad:      22.5 mmHg AV Mean Grad:      12.0 mmHg LVOT Vmax:         171.00 cm/s LVOT Vmean:        117.500 cm/s LVOT VTI:          0.353 m LVOT/AV VTI ratio: 0.77  AORTA Ao Root diam: 4.10 cm Ao Asc diam:  4.10 cm MITRAL VALVE MV Area (PHT): 2.00 cm     SHUNTS MV Decel Time: 380 msec     Systemic VTI:  0.35 m MV E velocity: 96.40 cm/s   Systemic Diam: 2.10 cm MV A velocity: 127.00 cm/s MV E/A ratio:  0.76 Marca Ancona MD Electronically signed by Marca Ancona MD Signature Date/Time: 03/26/2021/3:41:52 PM    Final    VAS US CAROTID (at [facility] and [facility] only)  Result Date: 03/26/2021 Carotid Arterial Duplex Study Patient Name:  Steaven KERY BATZEL  Date of Exam:   03/26/2021 Medical Rec #: [id number]       Accession #:    [id number] Date of Birth: [birth year]      Patient Gender: M Patient Age:   26Y Exam Location:  [facility] Procedure:      VAS US CAROTID Referring Phys: [id number] TIMOTHY S OPYD --------------------------------------------------------------------------------  Indications:      CVA and Speech disturbance. Risk Factors:     Hypertension, hyperlipidemia, Diabetes, past history of                   smoking. Comparison Study: 12/16/2017- pre-CABG Dopplers: bilateral 1-39% ICA stenosis. Performing Technologist: Gertie Fey MHA, RDMS, RVT, RDCS  Examination Guidelines: A complete evaluation includes B-mode imaging, spectral Doppler, color Doppler, and power Doppler as needed of all accessible portions of each vessel. Bilateral testing is considered an integral part of a complete examination. Limited examinations for reoccurring indications may be performed as noted.  Right Carotid Findings: +----------+-------+--------+--------+-----------------------+-----------------+           PSV    EDV  cm/sStenosisPlaque Description     Comments                    cm/s                                                            +----------+-------+--------+--------+-----------------------+-----------------+  CCA Prox  80     16                                                       +----------+-------+--------+--------+-----------------------+-----------------+ CCA Distal73     15                                                       +----------+-------+--------+--------+-----------------------+-----------------+ ICA Prox  50     9                                      intimal                                                                   thickening        +----------+-------+--------+--------+-----------------------+-----------------+ ICA Distal74     20              smooth and heterogenous                  +----------+-------+--------+--------+-----------------------+-----------------+ ECA       195    23              heterogenous and                                                          calcific                                 +----------+-------+--------+--------+-----------------------+-----------------+ +----------+--------+-------+----------------+-------------------+           PSV cm/sEDV cmsDescribe        Arm Pressure (mmHG) +----------+--------+-------+----------------+-------------------+ Subclavian160            Multiphasic, WNL                    +----------+--------+-------+----------------+-------------------+ +---------+--------+--+--------+--+---------+ VertebralPSV cm/s57EDV cm/s11Antegrade +---------+--------+--+--------+--+---------+  Left Carotid Findings: +----------+--------+--------+--------+------------------+--------+           PSV cm/sEDV cm/sStenosisPlaque DescriptionComments +----------+--------+--------+--------+------------------+--------+ CCA Prox  80      11                                          +----------+--------+--------+--------+------------------+--------+ CCA Distal70      16                                         +----------+--------+--------+--------+------------------+--------+ ICA Prox  386  125     80-99%                             +----------+--------+--------+--------+------------------+--------+ ICA Mid   255     40                                         +----------+--------+--------+--------+------------------+--------+ ICA Distal163     26                                         +----------+--------+--------+--------+------------------+--------+ ECA       229     27                                         +----------+--------+--------+--------+------------------+--------+ +----------+--------+--------+----------------+-------------------+           PSV cm/sEDV cm/sDescribe        Arm Pressure (mmHG) +----------+--------+--------+----------------+-------------------+ [id number]             Multiphasic, WNL                    +----------+--------+--------+----------------+-------------------+ +---------+--------+--+--------+--+---------+ VertebralPSV cm/s58EDV cm/s13Antegrade +---------+--------+--+--------+--+---------+   Summary: Right Carotid: Velocities in the right ICA are consistent with a 1-39% stenosis. Left Carotid: Velocities in the left ICA are consistent with a 80-99% stenosis.               Significant progression of disease when compared to previous study               12/16/2017. Vertebrals:  Bilateral vertebral arteries demonstrate antegrade flow. Subclavians: Normal flow hemodynamics were seen in bilateral subclavian              arteries. *See table(s) above for measurements and observations.  Electronically signed by Delia Heady MD on 03/26/2021 at 2:00:49 PM.    Final     Scheduled Meds:  aspirin EC  81 mg Oral q AM   atorvastatin  10 mg Oral q AM   Chlorhexidine Gluconate Cloth  6 each Topical  Daily   ticagrelor  90 mg Oral BID   Continuous Infusions:  sodium chloride Stopped (03/26/21 2048)     LOS: 1 day   Time spent: 30 minutes   Hughie Closs, MD [facility]  03/27/2021, 3:53 PM   How to contact the [facility] Attending or Consulting provider 7A - 7P or covering provider during after hours 7P -7A, for this patient?  Check the care team in [facility] and look for a) attending/consulting [facility] provider listed and b) the [facility] team listed. Page or secure chat 7A-7P. Log into [email or web address] and use [place] [facility]'s universal password to access. If you do not have the password, please contact the hospital operator. Locate the [facility] provider you are looking for under [facility] and page to a number that you can be directly reached. If you still have difficulty reaching the provider, please page the [facility] (Director on Call) for the Hospitalists listed on amion for assistance.

## 2021-03-27 NOTE — Progress Notes (Signed)
Surgery cancelled per MD due to emergency. Pt updated, pt verbalizes understanding. Pt NAD, peripheral IV and A-line intact.  Report given to 4N RN.

## 2021-03-27 NOTE — ED Notes (Signed)
Tele  Breakfast Ordered 

## 2021-03-27 NOTE — H&P (Signed)
Chief Complaint: Patient was seen in consultation today for critical (L)ICA stenosis with symptoms  Referring Physician(s): Dr. Pearlean Brownie  Supervising Physician: Julieanne Cotton  Patient Status: [facility] - In-pt  History of Present Illness: Stephen Mullins is a 75 y.o. male admitted with slurred speech and right facial droop and right UE weakness.  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 He underwent cerebral arteriogram yesterday which confirms critical (L)ICA stenosis. After discussion with pt and wife, we have added to the schedule for intervention under anesthesia today, including likely stent assisted angioplasty PMHx, meds, labs, imaging, allergies reviewed. He received loading dose of Brilinta 180 mg yesterday, followed by additional 90 mg last pm. Has yet to receive this am dose of ASA and Brilinta. Thinks his aphasia is a little improved this am. Denies any other c/o CP, SOB, fevers, chills. Has been NPO today. Wife at bedside.   Past Medical History:  Diagnosis Date   Bicuspid aortic valve    with aortic stenosis and mild insufficiency, mild to moderate aortic root dilitation   Borderline hyperlipidemia    Diabetes mellitus without complication (HCC)    Elevated fasting glucose    Elevated PSA    due to prostatitis   GERD (gastroesophageal reflux disease)    Heart murmur    Hypertension    Nail fungus    Thoracic ascending aortic aneurysm [facility])     Past Surgical History:  Procedure Laterality Date   AORTIC VALVE REPLACEMENT N/A 12/20/2017   Procedure: AORTIC VALVE REPLACEMENT (AVR);  Surgeon: [name], [name], MD;  Location: [facility] OR;  Service: Open Heart Surgery;  Laterality: N/A;   CATARACT EXTRACTION, BILATERAL     RIGHT/LEFT HEART CATH AND CORONARY ANGIOGRAPHY N/A 11/09/2017   Procedure: RIGHT/LEFT HEART CATH AND CORONARY ANGIOGRAPHY;  Surgeon: [name],  MD;  Location: [facility] INVASIVE CV LAB;  Service: Cardiovascular;  Laterality: N/A;   TEE WITHOUT CARDIOVERSION N/A 12/20/2017   Procedure: TRANSESOPHAGEAL ECHOCARDIOGRAM (TEE);  Surgeon: [name], [name], MD;  Location: [facility] OR;  Service: Open Heart Surgery;  Laterality: N/A;    Allergies: Tetanus toxoid, adsorbed  Medications:  Current Facility-Administered Medications:    0.9 %  sodium chloride infusion, , Intravenous, Continuous, Deveshwar, Sanjeev, MD, Stopped at 03/26/21 2048   acetaminophen (TYLENOL) tablet 650 mg, 650 mg, Oral, Q4H PRN **OR** acetaminophen (TYLENOL) 160 MG/5ML solution 650 mg, 650 mg, Per Tube, Q4H PRN **OR** acetaminophen (TYLENOL) suppository 650 mg, 650 mg, Rectal, Q4H PRN, Opyd, Lavone Neri, MD   aspirin EC tablet 81 mg, 81 mg, Oral, q AM, Opyd, Lavone Neri, MD, 81 mg at 03/26/21 0742   atorvastatin (LIPITOR) tablet 10 mg, 10 mg, Oral, q AM, Opyd, Lavone Neri, MD, 10 mg at 03/26/21 0747   senna-docusate (Senokot-S) tablet 1 tablet, 1 tablet, Oral, QHS PRN, Opyd, Lavone Neri, MD   ticagrelor (BRILINTA) tablet 90 mg, 90 mg, Oral, BID, Yunis Voorheis, PA-C  Current Outpatient Medications:    acetaminophen (TYLENOL) 500 MG tablet, Take 2 tablets (1,000 mg total) by mouth every 6 (six) hours as needed. (Patient taking differently: Take 1,000 mg by mouth every 6 (six) hours as needed (PAIN).), Disp: 30 tablet, Rfl: 0   amoxicillin (AMOXIL) 500 MG capsule, TAKE 4 TABLETS BY MOUTH 1 HOUR BEFORE DENTAL PROCEDURE (Patient taking differently: Take 2,000 mg by mouth See admin instructions. TAKE 4 TABLETS (2000 MG) BY MOUTH 1 HOUR BEFORE DENTAL  PROCEDURE), Disp: 4 capsule, Rfl: 3   aspirin EC 81 MG tablet, Take 81 mg by mouth in the morning. Swallow whole., Disp: , Rfl:    atorvastatin (LIPITOR) 10 MG tablet, Take 10 mg by mouth in the morning., Disp: , Rfl:    Efinaconazole 10 % SOLN, Apply 1 drop topically daily. (Patient taking differently: Apply 1 drop topically daily. APPLIED TO TOE  NAILS), Disp: 4 mL, Rfl: 11   hydrochlorothiazide (HYDRODIURIL) 12.5 MG tablet, Take 1 tablet by mouth once daily (Patient taking differently: Take 12.5 mg by mouth in the morning.), Disp: 90 tablet, Rfl: 3   hydrocortisone cream 1 %, Apply 1 application topically daily as needed for itching., Disp: , Rfl:    lisinopril (ZESTRIL) 5 MG tablet, Take 5 mg by mouth in the morning., Disp: , Rfl:    neomycin-bacitracin-polymyxin (NEOSPORIN) ointment, Apply 1 application topically daily as needed for wound care., Disp: , Rfl:    oxymetazoline (AFRIN) 0.05 % nasal spray, Place 1 spray into both nostrils daily as needed for congestion., Disp: , Rfl:    NONFORMULARY OR COMPOUNDED ITEM, [facility]: Antifungal nail lacquer - Fluconazole 2%, Terbinafine 1%, DMSO. (Patient not taking: No sig reported), Disp: 120 each, Rfl: 3    Family History  Problem Relation Age of Onset   Prostate cancer Father     Social History   Socioeconomic History   Marital status: Married    Spouse name: Not on file   Number of children: Not on file   Years of education: Not on file   Highest education level: Not on file  Occupational History   Occupation: retired  Tobacco Use   Smoking status: Former    Types: Cigarettes    Quit date: 10/20/2001    Years since quitting: 19.4   Smokeless tobacco: Never  Vaping Use   Vaping Use: Never used  Substance and Sexual Activity   Alcohol use: Yes    Alcohol/week: 1.0 - 2.0 standard drink    Types: 1 - 2 Glasses of wine per week   Drug use: No   Sexual activity: Not on file  Other Topics Concern   Not on file  Social History Narrative   Married 55 yrs (as of July 2022). Enjoys golf.     Social Determinants of Health   Financial Resource Strain: Not on file  Food Insecurity: Not on file  Transportation Needs: Not on file  Physical Activity: Not on file  Stress: Not on file  Social Connections: Not on file    Review of Systems: A 12 point ROS discussed and  pertinent positives are indicated in the HPI above.  All other systems are negative.  Review of Systems  Vital Signs: BP (!) 146/80   Pulse 71   Temp 99 F (37.2 C) (Oral)   Resp 20   Ht 5\' 11"  (1.803 m)   Wt 88.5 kg   SpO2 97%   BMI 27.20 kg/m   Physical Exam Constitutional:      General: He is not in acute distress.    Appearance: He is not ill-appearing.  HENT:     Mouth/Throat:     Mouth: Mucous membranes are moist.     Pharynx: Oropharynx is clear.  Cardiovascular:     Rate and Rhythm: Normal rate and regular rhythm.     Pulses: Normal pulses.     Heart sounds: Normal heart sounds.  Pulmonary:     Effort: Pulmonary effort is normal. No  respiratory distress.     Breath sounds: Normal breath sounds.  Skin:    General: Skin is warm and dry.  Neurological:     Comments: Awake and alert PERRLA, EOMI Face symmetric, tongue midline No drift. Fine motor intact, sluggish on right UE Grip strength 5+ and equal.      Imaging: CT HEAD WO CONTRAST  Result Date: 03/25/2021 CLINICAL DATA:  Possible TIA EXAM: CT HEAD WITHOUT CONTRAST TECHNIQUE: Contiguous axial images were obtained from the base of the skull through the vertex without intravenous contrast. COMPARISON:  None. FINDINGS: Brain: No hemorrhage is visualized. Rounded area of hypodensity at the left temporoparietal junction consistent with edema. No midline shift or significant mass effect. Mild atrophy. Nonenlarged ventricles. Vascular: No hyperdense vessels.  Carotid vascular calcification Skull: Normal. Negative for fracture or focal lesion. Sinuses/Orbits: Mucosal thickening in the sinuses Other: None IMPRESSION: 1. Focal hypodensity at the left temporoparietal junction consistent with an area of edema, probably due to small acute or subacute infarct. No definite hemorrhage at this time. 2. Atrophy Electronically Signed   By: Jasmine Pang M.D.   On: 03/25/2021 16:17   MR ANGIO HEAD WO CONTRAST  Result Date:  03/25/2021 CLINICAL DATA:  Neuro deficit, acute, stroke suspected; Stroke, follow up EXAM: MRI HEAD WITHOUT CONTRAST MRA HEAD WITHOUT CONTRAST TECHNIQUE: Multiplanar, multi-echo pulse sequences of the brain and surrounding structures were acquired without intravenous contrast. Angiographic images of the Circle of Willis were acquired using MRA technique without intravenous contrast. COMPARISON:  No pertinent prior exam. FINDINGS: MRI HEAD FINDINGS Brain: Multifocal abnormal diffusion restriction posterior left MCA territory. No acute or chronic hemorrhage. Mild edema in the left parietal lobe at one of the larger infarct sites. Mild generalized volume loss. The midline structures are normal. Vascular: Major flow voids are preserved. Skull and upper cervical spine: Normal calvarium and skull base. Visualized upper cervical spine and soft tissues are normal. Sinuses/Orbits:No paranasal sinus fluid levels or advanced mucosal thickening. No mastoid or middle ear effusion. Normal orbits. MRA HEAD FINDINGS POSTERIOR CIRCULATION: --Vertebral arteries: Normal --Inferior cerebellar arteries: Normal. --Basilar artery: Normal. --Superior cerebellar arteries: Normal. --Posterior cerebral arteries: Normal. ANTERIOR CIRCULATION: --Intracranial internal carotid arteries: Normal. --Anterior cerebral arteries (ACA): Normal. --Middle cerebral arteries (MCA): Normal. ANATOMIC VARIANTS: None IMPRESSION: 1. 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 No hemorrhage or mass effect. 2. Normal intracranial MRA. Electronically Signed   By: Deatra Robinson M.D.   On: 03/25/2021 21:50   MR BRAIN WO CONTRAST  Result Date: 03/25/2021 CLINICAL DATA:  Neuro deficit, acute, stroke suspected; Stroke, follow up EXAM: MRI HEAD WITHOUT CONTRAST MRA HEAD WITHOUT CONTRAST TECHNIQUE: Multiplanar, multi-echo pulse sequences of the brain and surrounding structures were acquired without intravenous contrast. Angiographic images of  the Circle of Willis were acquired using MRA technique without intravenous contrast. COMPARISON:  No pertinent prior exam. FINDINGS: MRI HEAD FINDINGS Brain: Multifocal abnormal diffusion restriction posterior left MCA territory. No acute or chronic hemorrhage. Mild edema in the left parietal lobe at one of the larger infarct sites. Mild generalized volume loss. The midline structures are normal. Vascular: Major flow voids are preserved. Skull and upper cervical spine: Normal calvarium and skull base. Visualized upper cervical spine and soft tissues are normal. Sinuses/Orbits:No paranasal sinus fluid levels or advanced mucosal thickening. No mastoid or middle ear effusion. Normal orbits. MRA HEAD FINDINGS POSTERIOR CIRCULATION: --Vertebral arteries: Normal --Inferior cerebellar arteries: Normal. --Basilar artery: Normal. --Superior cerebellar arteries: Normal. --Posterior cerebral arteries: Normal. ANTERIOR CIRCULATION: --  Intracranial internal carotid arteries: Normal. --Anterior cerebral arteries (ACA): Normal. --Middle cerebral arteries (MCA): Normal. ANATOMIC VARIANTS: None IMPRESSION: 1. 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 No hemorrhage or mass effect. 2. Normal intracranial MRA. Electronically Signed   By: Deatra Robinson M.D.   On: 03/25/2021 21:50   ECHOCARDIOGRAM COMPLETE  Result Date: 03/26/2021    ECHOCARDIOGRAM REPORT   Patient Name:   Darroll NYREE YONKER Date of Exam: 03/26/2021 Medical Rec #:  [id number]      Height:       71.0 in Accession #:    [id number]     Weight:       195.0 lb Date of Birth:  [birth year]     BSA:          2.086 m Patient Age:    74 years       BP:           162/83 mmHg Patient Gender: M              HR:           56 bpm. Exam Location:  Inpatient Procedure: 2D Echo Indications:    stroke  History:        Patient has prior history of Echocardiogram examinations, most                 recent 07/24/2020. CAD; Risk Factors:Dyslipidemia and                  Hypertension.                 Aortic Valve: valve is present in the aortic position.  Sonographer:    Delcie Roch Referring Phys: [id number] TIMOTHY S OPYD IMPRESSIONS  1. Left ventricular ejection fraction, by estimation, is 60 to 65%. The left ventricle has normal function. The left ventricle has no regional wall motion abnormalities. There is mild left ventricular hypertrophy. Left ventricular diastolic parameters are consistent with Grade I diastolic dysfunction (impaired relaxation).  2. Right ventricular systolic function is normal. The right ventricular size is normal.  3. Left atrial size was mildly dilated.  4. The mitral valve is normal in structure. No evidence of mitral valve regurgitation. No evidence of mitral stenosis.  5. 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 Mean gradient 12 mmHg with no significant regurgitation.  6. Aortic dilatation noted. There is mild dilatation of the ascending aorta, measuring 41 mm.  7. The inferior vena cava is normal in size with greater than 50% respiratory variability, suggesting right atrial pressure of 3 mmHg. FINDINGS  Left Ventricle: Left ventricular ejection fraction, by estimation, is 60 to 65%. The left ventricle has normal function. The left ventricle has no regional wall motion abnormalities. The left ventricular internal cavity size was normal in size. There is  mild left ventricular hypertrophy. Left ventricular diastolic parameters are consistent with Grade I diastolic dysfunction (impaired relaxation). Right Ventricle: The right ventricular size is normal. No increase in right ventricular wall thickness. Right ventricular systolic function is normal. Left Atrium: Left atrial size was mildly dilated. Right Atrium: Right atrial size was normal in size. Pericardium: There is no evidence of pericardial effusion. Mitral Valve: The mitral valve is normal in structure. There is mild calcification of the mitral valve leaflet(s). Mild mitral  annular calcification. No evidence of mitral valve regurgitation. No evidence of mitral valve stenosis. Tricuspid Valve: The tricuspid valve is normal in structure. Tricuspid valve regurgitation is not  demonstrated. 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 Mean gradient 12 mmHg with no significant regurgitation. The aortic valve has been repaired/replaced. Aortic valve regurgitation is not visualized. Aortic valve mean gradient measures 12.0 mmHg. Aortic valve peak gradient measures 22.5 mmHg. Aortic valve area, by VTI measures 2.67 cm. There is a valve present in the aortic position. Pulmonic Valve: The pulmonic valve was normal in structure. Pulmonic valve regurgitation is trivial. Aorta: Aortic dilatation noted. There is mild dilatation of the ascending aorta, measuring 41 mm. Venous: The inferior vena cava is normal in size with greater than 50% respiratory variability, suggesting right atrial pressure of 3 mmHg. IAS/Shunts: No atrial level shunt detected by color flow Doppler.  LEFT VENTRICLE PLAX 2D LVIDd:         4.90 cm  Diastology LVIDs:         3.00 cm  LV e' medial:    10.10 cm/s LV PW:         1.20 cm  LV E/e' medial:  9.5 LV IVS:        1.40 cm  LV e' lateral:   11.90 cm/s LVOT diam:     2.10 cm  LV E/e' lateral: 8.1 LV SV:         122 LV SV Index:   59 LVOT Area:     3.46 cm  RIGHT VENTRICLE             IVC RV S prime:     18.60 cm/s  IVC diam: 1.40 cm TAPSE (M-mode): 1.8 cm LEFT ATRIUM             Index       RIGHT ATRIUM           Index LA diam:        4.50 cm 2.16 cm/m  RA Area:     16.50 cm LA Vol (A2C):   77.8 ml 37.30 ml/m RA Volume:   40.70 ml  19.51 ml/m LA Vol (A4C):   64.3 ml 30.82 ml/m LA Biplane Vol: 72.9 ml 34.95 ml/m  AORTIC VALVE AV Area (Vmax):    2.50 cm AV Area (Vmean):   2.62 cm AV Area (VTI):     2.67 cm AV Vmax:           237.00 cm/s AV Vmean:          155.500 cm/s AV VTI:            0.457 m AV Peak Grad:      22.5 mmHg AV Mean Grad:       12.0 mmHg LVOT Vmax:         171.00 cm/s LVOT Vmean:        117.500 cm/s LVOT VTI:          0.353 m LVOT/AV VTI ratio: 0.77  AORTA Ao Root diam: 4.10 cm Ao Asc diam:  4.10 cm MITRAL VALVE MV Area (PHT): 2.00 cm     SHUNTS MV Decel Time: 380 msec     Systemic VTI:  0.35 m MV E velocity: 96.40 cm/s   Systemic Diam: 2.10 cm MV A velocity: 127.00 cm/s MV E/A ratio:  0.76 Marca Anconaalton Mclean MD Electronically signed by Marca Anconaalton Mclean MD Signature Date/Time: 03/26/2021/3:41:52 PM    Final    EXERCISE TOLERANCE TEST (ETT)  Result Date: 03/18/2021  Blood pressure demonstrated a normal response to exercise.  ST segment depression of 5 mm was noted during stress in  the II, III, aVF, V5 and V6 leads.  ETT with fair exercise tolerance (6:42); no chest pain but patient did complain of dyspnea; normal blood pressure response; 3 beats nonsustained ventricular tachycardia and frequent PVCs with exercise and 5 beats nonsustained ventricular tachycardia in recovery; 4 to 5 mm of ST depression in the inferior lateral leads and 3 mm ST elevation in AvR; abnormal ETT. Results discussed with Dr Anne Fu.   VAS US CAROTID (at [facility] and [facility] only)  Result Date: 03/26/2021 Carotid Arterial Duplex Study Patient Name:  ROWEN WILMER  Date of Exam:   03/26/2021 Medical Rec #: [id number]       Accession #:    [id number] Date of Birth: [birth year]      Patient Gender: M Patient Age:   2Y Exam Location:  [facility] Procedure:      VAS US CAROTID Referring Phys: [id number] TIMOTHY S OPYD --------------------------------------------------------------------------------  Indications:      CVA and Speech disturbance. Risk Factors:     Hypertension, hyperlipidemia, Diabetes, past history of                   smoking. Comparison Study: 12/16/2017- pre-CABG Dopplers: bilateral 1-39% ICA stenosis. Performing Technologist: Gertie Fey MHA, RDMS, RVT, RDCS  Examination Guidelines: A complete evaluation includes B-mode imaging, spectral Doppler,  color Doppler, and power Doppler as needed of all accessible portions of each vessel. Bilateral testing is considered an integral part of a complete examination. Limited examinations for reoccurring indications may be performed as noted.  Right Carotid Findings: +----------+-------+--------+--------+-----------------------+-----------------+           PSV    EDV cm/sStenosisPlaque Description     Comments                    cm/s                                                            +----------+-------+--------+--------+-----------------------+-----------------+ CCA Prox  80     16                                                       +----------+-------+--------+--------+-----------------------+-----------------+ CCA Distal73     15                                                       +----------+-------+--------+--------+-----------------------+-----------------+ ICA Prox  50     9                                      intimal  thickening        +----------+-------+--------+--------+-----------------------+-----------------+ ICA Distal74     20              smooth and heterogenous                  +----------+-------+--------+--------+-----------------------+-----------------+ ECA       195    23              heterogenous and                                                          calcific                                 +----------+-------+--------+--------+-----------------------+-----------------+ +----------+--------+-------+----------------+-------------------+           PSV cm/sEDV cmsDescribe        Arm Pressure (mmHG) +----------+--------+-------+----------------+-------------------+ Subclavian160            Multiphasic, WNL                    +----------+--------+-------+----------------+-------------------+ +---------+--------+--+--------+--+---------+  VertebralPSV cm/s57EDV cm/s11Antegrade +---------+--------+--+--------+--+---------+  Left Carotid Findings: +----------+--------+--------+--------+------------------+--------+           PSV cm/sEDV cm/sStenosisPlaque DescriptionComments +----------+--------+--------+--------+------------------+--------+ CCA Prox  80      11                                         +----------+--------+--------+--------+------------------+--------+ CCA Distal70      16                                         +----------+--------+--------+--------+------------------+--------+ ICA Prox  386     125     80-99%                             +----------+--------+--------+--------+------------------+--------+ ICA Mid   255     40                                         +----------+--------+--------+--------+------------------+--------+ ICA Distal163     26                                         +----------+--------+--------+--------+------------------+--------+ ECA       229     27                                         +----------+--------+--------+--------+------------------+--------+ +----------+--------+--------+----------------+-------------------+           PSV cm/sEDV cm/sDescribe        Arm Pressure (mmHG) +----------+--------+--------+----------------+-------------------+ [id number]             Multiphasic, WNL                    +----------+--------+--------+----------------+-------------------+ +---------+--------+--+--------+--+---------+  VertebralPSV cm/s58EDV cm/s13Antegrade +---------+--------+--+--------+--+---------+   Summary: Right Carotid: Velocities in the right ICA are consistent with a 1-39% stenosis. Left Carotid: Velocities in the left ICA are consistent with a 80-99% stenosis.               Significant progression of disease when compared to previous study               12/16/2017. Vertebrals:  Bilateral vertebral arteries demonstrate  antegrade flow. Subclavians: Normal flow hemodynamics were seen in bilateral subclavian              arteries. *See table(s) above for measurements and observations.  Electronically signed by Delia Heady MD on 03/26/2021 at 2:00:49 PM.    Final     Labs:  CBC: Recent Labs    12/05/20 1107 03/24/21 1037 03/25/21 1536 03/25/21 1632 03/26/21 0345  WBC 7.5 6.9 8.3  --  7.1  HGB 16.1 15.4 15.4 15.6 14.6  HCT 48.5 45.9 46.5 46.0 43.4  PLT 269 239 250  --  215     COAGS: Recent Labs    03/25/21 1536  INR 1.1  APTT 32     BMP: Recent Labs    03/24/21 1037 03/25/21 1536 03/25/21 1632 03/26/21 0345 03/27/21 0500  NA 139 139 141 137 136  K 4.3 4.2 4.1 3.8 4.0  CL 102 103 105 105 108  CO2 30* 28  --  27 20*  GLUCOSE 121* 113* 108* 141* 134*  BUN CALCIUM 10.5* 10.0  --  9.5 9.2  CREATININE 0.83 1.09 1.00 0.95 0.90  GFRNONAA  --  >60  --  >60 >60     LIVER FUNCTION TESTS: Recent Labs    09/03/20 1051 10/16/20 1046 12/05/20 1107 03/25/21 1536  BILITOT 0.5 0.6 0.5 0.6  AST ALT ALKPHOS 60 56  --  43  PROT 7.1 6.9 7.2 6.8  ALBUMIN 4.6 4.3  --  4.0     TUMOR MARKERS: No results for input(s): AFPTM, CEA, CA199, CHROMGRNA in the last 8760 hours.  Assessment and Plan: Critical (L)ICA stenosis. Plan for angiogram with intervention/stent angioplasty today under general anesthesia. Meds, labs reviewed. To receive Brilinta and ASA this am. Pt and wife still agreeable to procfedure Risks and benefits of carotid angioplasty were discussed with the patient including, but not limited to bleeding, infection, vascular injury or contrast induced renal failure.  This interventional procedure involves the use of X-rays and because of the nature of the planned procedure, it is possible that we will have prolonged use of X-ray fluoroscopy.  Potential radiation risks to you include (but are not limited to) the following: - A slightly  elevated risk for cancer  several years later in life. This risk is typically less than 0.5% percent. This risk is low in comparison to the normal incidence of human cancer, which is 33% for women and 50% for men according to the [employer] Society. - Radiation induced injury can include skin redness, resembling a rash, tissue breakdown/ulcers and hair loss (which can be temporary or permanent).   The likelihood of either of these occurring depends on the difficulty of the procedure and whether you are sensitive to radiation due to previous procedures, disease, or genetic conditions.   IF your procedure requires a prolonged use of radiation, you will be notified and given written instructions for further action.  It is your  responsibility to monitor the irradiated area for the 2 weeks following the procedure and to notify your physician if you are concerned that you have suffered a radiation induced injury.    All of the patient's questions were answered, patient is agreeable to proceed.  Consent signed and in chart.    Thank you for this interesting consult.  I greatly enjoyed meeting RIGGINS CISEK and look forward to participating in their care.  A copy of this report was sent to the requesting provider on this date.  Electronically Signed: Brayton El, PA-C 03/27/2021, 10:14 AM   I spent a total of 25 minutes in face to face in clinical consultation, greater than 50% of which was counseling/coordinating care for symptomatic (L)carotid stenosis.

## 2021-03-27 NOTE — ED Notes (Signed)
Pt ambulated to the bathroom x 1 assist.

## 2021-03-27 NOTE — Progress Notes (Signed)
Physical Therapy Treatment Patient Details Name: Stephen Mullins MRN: [id number] DOB: [birth year] Today's Date: 03/27/2021    History of Present Illness 75 yo admitted 7/19 with transient R facial numbness, R facial droop and R sided weakness. 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 Pt is s/p cerebral angiogram on 7/20. PMH significant for hyperlipidemia, diabetes, hypertension, GERD, aortic valve replacement.    PT Comments    Pt progressing towards goals. Continues to drift to the R during mobility and required cues to avoid obstacles on the R. Practiced visual scanning to the R to increase safety with mobility. Requiring min guard A for mobility using RW. Current recommendations appropriate. Will continue to follow acutely.      Follow Up Recommendations  Outpatient PT;Supervision for mobility/OOB (neuro outpatient PT)     Equipment Recommendations  None recommended by PT    Recommendations for Other Services       Precautions / Restrictions Precautions Precautions: Fall Restrictions Weight Bearing Restrictions: No    Mobility  Bed Mobility Overal bed mobility: Modified Independent                  Transfers Overall transfer level: Needs assistance Equipment used: Rolling walker (2 wheeled) Transfers: Sit to/from Stand Sit to Stand: Min guard         General transfer comment: Min guard for safety to stand. Cues for hand placement  Ambulation/Gait Ambulation/Gait assistance: Min assist;Min guard Gait Distance (Feet): 100 Feet Assistive device: Rolling walker (2 wheeled) Gait Pattern/deviations: Step-through pattern;Decreased stride length;Drifts right/left Gait velocity: Decreased   General Gait Details: Pt running into objects on the R. Cues for visual scanning to the R, however, pt with difficulty understanding commands. Educated wife on importance of visual scanning to ensure safety with mobility.   Stairs              Wheelchair Mobility    Modified Rankin (Stroke Patients Only) Modified Rankin (Stroke Patients Only) Pre-Morbid Rankin Score: No symptoms Modified Rankin: Moderately severe disability     Balance Overall balance assessment: Needs assistance Sitting-balance support: No upper extremity supported Sitting balance-Leahy Scale: Good     Standing balance support: No upper extremity supported;During functional activity Standing balance-Leahy Scale: Fair                              Cognition Arousal/Alertness: Awake/alert Behavior During Therapy: WFL for tasks assessed/performed Overall Cognitive Status: Difficult to assess                                 General Comments: Difficult to assess due to language deficits.      Exercises      General Comments General comments (skin integrity, edema, etc.): Pt's wife present in room      Pertinent Vitals/Pain Pain Assessment: No/denies pain    Home Living                      Prior Function            PT Goals (current goals can now be found in the care plan section) Acute Rehab PT Goals Patient Stated Goal: to go home PT Goal Formulation: With patient Time For Goal Achievement: 04/09/21 Potential to Achieve Goals: Good Progress towards PT goals: Progressing toward goals    Frequency  Min 4X/week      PT Plan Current plan remains appropriate    Co-evaluation              AM-PAC PT "6 Clicks" Mobility   Outcome Measure  Help needed turning from your back to your side while in a flat bed without using bedrails?: None Help needed moving from lying on your back to sitting on the side of a flat bed without using bedrails?: None Help needed moving to and from a bed to a chair (including a wheelchair)?: A Little Help needed standing up from a chair using your arms (e.g., wheelchair or bedside chair)?: A Little Help needed to walk in hospital room?: A Little Help  needed climbing 3-5 steps with a railing? : A Little 6 Click Score: 20    End of Session Equipment Utilized During Treatment: Gait belt Activity Tolerance: Patient tolerated treatment well Patient left: in bed;with call bell/phone within reach (on stretcher in ED with transport staff present) Nurse Communication: Mobility status PT Visit Diagnosis: Unsteadiness on feet (R26.81);Other symptoms and signs involving the nervous system (G66.599)     Time: 3570-1779 PT Time Calculation (min) (ACUTE ONLY): 15 min  Charges:  $Gait Training: 8-22 mins                     Stephen Mullins, DPT  Acute Rehabilitation Services  Pager: [phone] Office: [phone]    Stephen Mullins 03/27/2021, 10:47 AM

## 2021-03-27 NOTE — Progress Notes (Signed)
STROKE TEAM PROGRESS NOTE   INTERVAL HISTORY No significant overnight events. His wife is at bedside. He is scheduled for carotid stent today but procedure postponed due to emergent code stroke pt going for thrombectomy Vitals:   03/27/21 1515 03/27/21 1600 03/27/21 1636 03/27/21 1700  BP:   138/77 (!) 114/57  Pulse:  (!) 52 64 62  Resp:  13 20 16   Temp: 99.5 F (37.5 C)     TempSrc: Oral     SpO2:  96% 95% 94%  Weight:      Height:       CBC:  Recent Labs  Lab 03/25/21 1536 03/25/21 1632 03/26/21 0345  WBC 8.3  --  7.1  NEUTROABS 5.6  --   --   HGB 15.4 15.6 14.6  HCT 46.5 46.0 43.4  MCV 89.8  --  88.6  PLT 250  --  215   Basic Metabolic Panel:  Recent Labs  Lab 03/26/21 0345 03/27/21 0500  NA 137 136  K 3.8 4.0  CL 105 108  CO2 27 20*  GLUCOSE 141* 134*  BUN 14 12  CREATININE 0.95 0.90  CALCIUM 9.5 9.2   Lipid Panel:  Recent Labs  Lab 03/26/21 0345  CHOL 154  TRIG 118  HDL 39*  CHOLHDL 3.9  VLDL 24  LDLCALC 91   HgbA1c:  Recent Labs  Lab 03/26/21 0345  HGBA1C 6.4*   Urine Drug Screen:  Recent Labs  Lab 03/25/21 2238  LABOPIA NONE DETECTED  COCAINSCRNUR NONE DETECTED  LABBENZ NONE DETECTED  AMPHETMU NONE DETECTED  THCU NONE DETECTED  LABBARB NONE DETECTED    Alcohol Level  Recent Labs  Lab 03/26/21 0345  ETH <10   Code Stroke CT head: left tempoparietal junction infarct acute vs subacute  MRI brain: multifocal acute infarcts within the posterior left MCA territory  Carotid 03/28/21: minimal right ICA stenosis. 80-89% stenosis of left ICA. Normal vertebral flow   PHYSICAL EXAM General: Well appearing elderly Caucasian male who appears frustrated but not in distress Cardiac: RRR on telemetry with occassional PVCs Neurological Exam:  Mental status: alert and oriented Speech: expressive aphasia. Speech is hesitant with significant word finding difficulties and paraphasic errors.  Good comprehension.  Difficulty with naming and  repetition. Cranial nerves: EOM and peripheral vision intact. No facial droop. Hearing intact. Tongue midline.  Motor: 5/5 strength in the bilateral upper and lower extremities. Normal bulk and tone of muscle.  Sensation intact throughout.  Normal finger to nose. Normal rapid alternating movement.   ASSESSMENT/PLAN Stephen Mullins is a 75 y.o. male with PMH significant for hyperlipidemia, hypertension, GERD, aortic valve replacement who presented with transient right facial numbness and facial droop and was found to have a left frontoparietal infarct. Symptoms had resolved and NIHHS 0 at time of admission. He did not receive tPA.   Multifocal left MCA territory infarcts due to symptomatic high-grade proximal left ICA stenosis . Residual deficits are expressive aphasia Code Stroke CT head: left tempoparietal junction infarct acute vs subacute MRI brain: multifocal acute infarcts within the posterior left MCA territory Carotid 61: minimal right ICA stenosis. 80-89% stenosis of left ICA. Normal vertebral flow IR angiogram: Preocclusive severe proximal left ICA stenosis 2D Echo left ventricular ejection fraction 60 to 65%.   LDL 91 HgbA1c 6.4 DAPT with Brilinta and aspirin following elective left carotid stent therapy recommendations:  outpatient SLP and OT Disposition:  pending ongoing evaluation  Hypertension Now outside the window for permissive hypertension. Blood  pressure goal pending angiogram findings.   Hyperlipidemia LDL 91, goal < 70 Increase Lipitor to 40 mg daily  Other Stroke Risk Factors Advanced Age >/= 36  Former tobacco user, quit in 2003 CAD. No chest pain on admission. Recent abnormal stress test. He was supposed to undergo a LHC 7/21 however will need to reschedule   Hospital day # 1    Discussed with patient and wife and Dr. Corliss Skains . PTA stent tomorrow am.  Discussed with Dr. Jacqulyn Bath .  Greater than 50% time during this 25-minute visit was spent on counseling  and coordination of care about his stroke and symptomatic carotid stenosis and discussion about revascularization and answering questions  Delia Heady, MD Medical Director [facility] Pager: [phone] 03/27/2021 5:06 PM  To contact Stroke Continuity provider, please refer to [email or web address]. After hours, contact General Neurology

## 2021-03-27 NOTE — Anesthesia Procedure Notes (Signed)
Arterial Line Insertion Start/End7/21/2022 12:55 PM Performed by: Audie Pinto, CRNA  Patient location: Pre-op. Preanesthetic checklist: patient identified, IV checked, risks and benefits discussed, surgical consent, monitors and equipment checked, pre-op evaluation and timeout performed Lidocaine 1% used for infiltration Left, radial was placed Catheter size: 20 G Hand hygiene performed  and maximum sterile barriers used  Allen's test indicative of satisfactory collateral circulation Attempts: 1 Procedure performed without using ultrasound guided technique. Following insertion, dressing applied and Biopatch. Post procedure assessment: unchanged  Patient tolerated the procedure well with no immediate complications.

## 2021-03-27 NOTE — Telephone Encounter (Signed)
Pt is currently admitted abd cardiac cath has been cancelled at this time.

## 2021-03-27 NOTE — Progress Notes (Signed)
SLP Cancellation Note  Patient Details Name: Stephen Mullins MRN: [id number] DOB: Jan 26, 1946   Cancelled treatment:       Reason Eval/Treat Not Completed: Patient at procedure or test/unavailable   Sincere Liuzzi, Riley Nearing 03/27/2021, 1:58 PM

## 2021-03-28 ENCOUNTER — Inpatient Hospital Stay (HOSPITAL_COMMUNITY): Payer: Medicare HMO

## 2021-03-28 ENCOUNTER — Inpatient Hospital Stay (HOSPITAL_COMMUNITY): Payer: Medicare HMO | Admitting: Certified Registered Nurse Anesthetist

## 2021-03-28 ENCOUNTER — Encounter (HOSPITAL_COMMUNITY)
Admission: EM | Disposition: A | Discharge status: Home / Self Care | Payer: Self-pay | Source: Home / Self Care | Attending: Family Medicine

## 2021-03-28 ENCOUNTER — Other Ambulatory Visit (HOSPITAL_COMMUNITY): Payer: Self-pay

## 2021-03-28 DIAGNOSIS — I639 Cerebral infarction, unspecified: Secondary | ICD-10-CM | POA: Diagnosis not present

## 2021-03-28 DIAGNOSIS — I63232 Cerebral infarction due to unspecified occlusion or stenosis of left carotid arteries: Secondary | ICD-10-CM | POA: Diagnosis present

## 2021-03-28 HISTORY — PX: IR US GUIDE VASC ACCESS RIGHT: IMG2390

## 2021-03-28 HISTORY — PX: RADIOLOGY WITH ANESTHESIA: SHX6223

## 2021-03-28 HISTORY — PX: IR CT HEAD LTD: IMG2386

## 2021-03-28 HISTORY — PX: IR INTRAVSC STENT CERV CAROTID W/EMB-PROT MOD SED INCL ANGIO: IMG2303

## 2021-03-28 LAB — POCT ACTIVATED CLOTTING TIME
Activated Clotting Time: 190 seconds
Activated Clotting Time: 167 seconds
Activated Clotting Time: 167 seconds

## 2021-03-28 LAB — HEPARIN LEVEL (UNFRACTIONATED): Heparin Unfractionated: <0.1 IU/mL — ABNORMAL LOW (ref 0.30–0.70)

## 2021-03-28 LAB — APTT: aPTT: 95 seconds — ABNORMAL HIGH (ref 24–36)

## 2021-03-28 LAB — GLUCOSE, CAPILLARY: Glucose-Capillary: 165 mg/dL — ABNORMAL HIGH (ref 70–99)

## 2021-03-28 SURGERY — IR WITH ANESTHESIA
Anesthesia: General

## 2021-03-28 MED ORDER — ACETAMINOPHEN 325 MG PO TABS: 650.0000 mg | ORAL_TABLET | ORAL | Status: DC | PRN

## 2021-03-28 MED ORDER — EPHEDRINE SULFATE-NACL 50-0.9 MG/10ML-% IV SOSY
PREFILLED_SYRINGE | INTRAVENOUS | Status: DC | PRN
  Administered 2021-03-28: 5 mg via INTRAVENOUS
  Administered 2021-03-28: 10 mg via INTRAVENOUS
  Administered 2021-03-28 (×3): 5 mg via INTRAVENOUS
  Administered 2021-03-28: 10 mg via INTRAVENOUS

## 2021-03-28 MED ORDER — HEPARIN (PORCINE) 25000 UT/250ML-% IV SOLN
800.0000 [IU]/h | INTRAVENOUS | Status: DC
  Filled 2021-03-28: qty 250

## 2021-03-28 MED ORDER — LIDOCAINE 2% (20 MG/ML) 5 ML SYRINGE
INTRAMUSCULAR | Status: DC | PRN
  Administered 2021-03-28: 80 mg via INTRAVENOUS

## 2021-03-28 MED ORDER — TICAGRELOR 90 MG PO TABS
ORAL_TABLET | ORAL | Status: AC
  Filled 2021-03-28: qty 1

## 2021-03-28 MED ORDER — TICAGRELOR 90 MG PO TABS
90.0000 mg | ORAL_TABLET | Freq: Two times a day (BID) | ORAL | Status: DC
  Administered 2021-03-28 – 2021-03-30 (×4): 90 mg via ORAL
  Filled 2021-03-28 (×4): qty 1

## 2021-03-28 MED ORDER — SODIUM CHLORIDE 0.9 % IV SOLN: INTRAVENOUS | Status: DC

## 2021-03-28 MED ORDER — NITROGLYCERIN 1 MG/10 ML FOR IR/CATH LAB
INTRA_ARTERIAL | Status: AC
  Filled 2021-03-28: qty 10

## 2021-03-28 MED ORDER — LIDOCAINE HCL 1 % IJ SOLN
INTRAMUSCULAR | Status: AC
  Filled 2021-03-28: qty 20

## 2021-03-28 MED ORDER — ORAL CARE MOUTH RINSE: 15.0000 mL | Freq: Once | OROMUCOSAL | Status: AC

## 2021-03-28 MED ORDER — VERAPAMIL HCL 2.5 MG/ML IV SOLN
INTRAVENOUS | Status: AC
  Filled 2021-03-28: qty 2

## 2021-03-28 MED ORDER — FENTANYL CITRATE (PF) 100 MCG/2ML IJ SOLN
INTRAMUSCULAR | Status: DC | PRN
  Administered 2021-03-28: 100 ug via INTRAVENOUS

## 2021-03-28 MED ORDER — OXYBUTYNIN CHLORIDE 5 MG PO TABS
5.0000 mg | ORAL_TABLET | Freq: Three times a day (TID) | ORAL | Status: DC | PRN
  Administered 2021-03-28: 5 mg via ORAL
  Filled 2021-03-28 (×2): qty 1

## 2021-03-28 MED ORDER — HEPARIN SODIUM (PORCINE) 1000 UNIT/ML IJ SOLN
INTRAMUSCULAR | Status: AC
  Filled 2021-03-28: qty 1

## 2021-03-28 MED ORDER — VERAPAMIL HCL 2.5 MG/ML IV SOLN
INTRAVENOUS | Status: AC | PRN
Start: 2021-03-28 — End: 2021-03-28
  Administered 2021-03-28: 2.5 mg via INTRA_ARTERIAL

## 2021-03-28 MED ORDER — CLEVIDIPINE BUTYRATE 0.5 MG/ML IV EMUL
0.0000 mg/h | INTRAVENOUS | Status: DC
  Administered 2021-03-28: 8 mg/h via INTRAVENOUS
  Administered 2021-03-28: 2 mg/h via INTRAVENOUS
  Administered 2021-03-28: 7 mg/h via INTRAVENOUS
  Filled 2021-03-28 (×3): qty 50

## 2021-03-28 MED ORDER — SUGAMMADEX SODIUM 200 MG/2ML IV SOLN
INTRAVENOUS | Status: DC | PRN
  Administered 2021-03-28: 200 mg via INTRAVENOUS

## 2021-03-28 MED ORDER — ACETAMINOPHEN 10 MG/ML IV SOLN
INTRAVENOUS | Status: DC | PRN
  Administered 2021-03-28: 1000 mg via INTRAVENOUS

## 2021-03-28 MED ORDER — ACETAMINOPHEN 650 MG RE SUPP: 650.0000 mg | RECTAL | Status: DC | PRN

## 2021-03-28 MED ORDER — PROPOFOL 10 MG/ML IV BOLUS
INTRAVENOUS | Status: DC | PRN
  Administered 2021-03-28: 140 mg via INTRAVENOUS

## 2021-03-28 MED ORDER — CEFAZOLIN SODIUM-DEXTROSE 2-3 GM-%(50ML) IV SOLR
INTRAVENOUS | Status: DC | PRN
  Administered 2021-03-28: 2 g via INTRAVENOUS

## 2021-03-28 MED ORDER — HEPARIN SODIUM (PORCINE) 1000 UNIT/ML IJ SOLN
INTRAMUSCULAR | Status: DC | PRN
  Administered 2021-03-28: 1000 [IU] via INTRAVENOUS
  Administered 2021-03-28: 500 [IU] via INTRAVENOUS
  Administered 2021-03-28 (×2): 1000 [IU] via INTRAVENOUS

## 2021-03-28 MED ORDER — DEXAMETHASONE SODIUM PHOSPHATE 10 MG/ML IJ SOLN
INTRAMUSCULAR | Status: DC | PRN
  Administered 2021-03-28: 4 mg via INTRAVENOUS

## 2021-03-28 MED ORDER — CLEVIDIPINE BUTYRATE 0.5 MG/ML IV EMUL
INTRAVENOUS | Status: AC
  Filled 2021-03-28: qty 50

## 2021-03-28 MED ORDER — PHENYLEPHRINE HCL-NACL 10-0.9 MG/250ML-% IV SOLN
INTRAVENOUS | Status: DC | PRN
  Administered 2021-03-28: 20 ug/min via INTRAVENOUS

## 2021-03-28 MED ORDER — ROCURONIUM BROMIDE 10 MG/ML (PF) SYRINGE
PREFILLED_SYRINGE | INTRAVENOUS | Status: DC | PRN
  Administered 2021-03-28: 50 mg via INTRAVENOUS
  Administered 2021-03-28: 30 mg via INTRAVENOUS
  Administered 2021-03-28: 70 mg via INTRAVENOUS

## 2021-03-28 MED ORDER — ACETAMINOPHEN 160 MG/5ML PO SOLN: 650.0000 mg | ORAL | Status: DC | PRN

## 2021-03-28 MED ORDER — HEPARIN (PORCINE) 25000 UT/250ML-% IV SOLN
INTRAVENOUS | Status: AC
  Filled 2021-03-28: qty 250

## 2021-03-28 MED ORDER — ACETAMINOPHEN 10 MG/ML IV SOLN
INTRAVENOUS | Status: AC
  Filled 2021-03-28: qty 100

## 2021-03-28 MED ORDER — EPTIFIBATIDE 20 MG/10ML IV SOLN
INTRAVENOUS | Status: AC | PRN
Start: 2021-03-28 — End: 2021-03-28
  Administered 2021-03-28 (×2): 1.5 mg via INTRA_ARTERIAL

## 2021-03-28 MED ORDER — FENTANYL CITRATE (PF) 100 MCG/2ML IJ SOLN
INTRAMUSCULAR | Status: AC
  Filled 2021-03-28: qty 2

## 2021-03-28 MED ORDER — HEPARIN (PORCINE) 25000 UT/250ML-% IV SOLN
900.0000 [IU]/h | INTRAVENOUS | Status: DC
  Filled 2021-03-28: qty 250

## 2021-03-28 MED ORDER — HEPARIN (PORCINE) 25000 UT/250ML-% IV SOLN
500.0000 [IU]/h | INTRAVENOUS | Status: DC
  Administered 2021-03-28: 500 [IU]/h via INTRAVENOUS

## 2021-03-28 MED ORDER — ASPIRIN 81 MG PO CHEW: 81.0000 mg | CHEWABLE_TABLET | Freq: Every day | ORAL | Status: DC

## 2021-03-28 MED ORDER — CHLORHEXIDINE GLUCONATE 0.12 % MT SOLN
OROMUCOSAL | Status: AC
  Administered 2021-03-28: 15 mL via OROMUCOSAL
  Filled 2021-03-28: qty 15

## 2021-03-28 MED ORDER — ASPIRIN 81 MG PO CHEW
81.0000 mg | CHEWABLE_TABLET | Freq: Every day | ORAL | Status: DC
  Administered 2021-03-29 – 2021-03-30 (×2): 81 mg via ORAL
  Filled 2021-03-28 (×2): qty 1

## 2021-03-28 MED ORDER — PHENYLEPHRINE 40 MCG/ML (10ML) SYRINGE FOR IV PUSH (FOR BLOOD PRESSURE SUPPORT)
PREFILLED_SYRINGE | INTRAVENOUS | Status: DC | PRN
  Administered 2021-03-28: 80 ug via INTRAVENOUS
  Administered 2021-03-28 (×3): 120 ug via INTRAVENOUS

## 2021-03-28 MED ORDER — CHLORHEXIDINE GLUCONATE 0.12 % MT SOLN: 15.0000 mL | Freq: Once | OROMUCOSAL | Status: AC

## 2021-03-28 MED ORDER — CEFAZOLIN SODIUM-DEXTROSE 2-4 GM/100ML-% IV SOLN
INTRAVENOUS | Status: AC
  Filled 2021-03-28: qty 100

## 2021-03-28 MED ORDER — HEPARIN SODIUM (PORCINE) 1000 UNIT/ML IJ SOLN
INTRAMUSCULAR | Status: AC | PRN
  Administered 2021-03-28: 2000 [IU] via INTRAVENOUS

## 2021-03-28 MED ORDER — ONDANSETRON HCL 4 MG/2ML IJ SOLN
INTRAMUSCULAR | Status: DC | PRN
  Administered 2021-03-28: 4 mg via INTRAVENOUS

## 2021-03-28 MED ORDER — TICAGRELOR 90 MG PO TABS: 90.0000 mg | ORAL_TABLET | Freq: Two times a day (BID) | ORAL | Status: DC

## 2021-03-28 MED ORDER — SODIUM CHLORIDE (PF) 0.9 % IJ SOLN
INTRAVENOUS | Status: AC | PRN
  Administered 2021-03-28: 200 ug via INTRA_ARTERIAL

## 2021-03-28 NOTE — Progress Notes (Addendum)
Urine leaking around coude catheter, bed pad wet; urine in catheter bag. MD S. Dahlstedt paged [phone]; orders placed for ditropan 5mg  q8h prn for bladder spasms.

## 2021-03-28 NOTE — Progress Notes (Signed)
Patient was transported for cerebral angiogram and stent early morning, did not get a chance to see him in person.  Discussed case with Dr. Pearlean Brownie and per him, patient will be transported to neuro ICU after the procedure under the care of Dr. Darien Ramus as primary attending.  he has already seen the patient.  Neurology will be primary service for next 24 hours.  If patient will be stable, he will be transferred back under [facility] service by tomorrow and neurology will let us know.

## 2021-03-28 NOTE — Transfer of Care (Signed)
Immediate Anesthesia Transfer of Care Note  Patient: Stephen Mullins  Procedure(s) Performed: Francine Graven PLACMENT  Patient Location: PACU  Anesthesia Type:General  Level of Consciousness: awake and patient cooperative  Airway & Oxygen Therapy: Patient Spontanous Breathing and Patient connected to nasal cannula oxygen  Post-op Assessment: Report given to RN, Post -op Vital signs reviewed and stable and unchanged from preop assessment  Post vital signs: Reviewed and stable  Last Vitals:  Vitals Value Taken Time  BP 166/60 03/28/21 1143  Temp 37 C 03/28/21 1145  Pulse 63 03/28/21 1157  Resp 16 03/28/21 1157  SpO2 97 % 03/28/21 1157  Vitals shown include unvalidated device data.  Last Pain:  Vitals:   03/28/21 0600  TempSrc:   PainSc: 0-No pain         Complications: No notable events documented.

## 2021-03-28 NOTE — Progress Notes (Signed)
STROKE TEAM PROGRESS NOTE   INTERVAL HISTORY Patient examined in PACU after carotid stent.  Procedure went well.  Patient is drowsy but can be easily aroused.  Still has expressive aphasia but appears improved compared to yesterday.  Is able to move all 4 extremities well against gravity.  Blood pressure is well controlled. Vitals:   03/28/21 1215 03/28/21 1230 03/28/21 1245 03/28/21 1315  BP: (!) 143/57 137/63 (!) 140/55   Pulse: (!) 59 63 (!) 59 62  Resp: 13 15 13 15   Temp:   98 F (36.7 C)   TempSrc:      SpO2: 100% 97% 96% 97%  Weight:      Height:       CBC:  Recent Labs  Lab 03/25/21 1536 03/25/21 1632 03/26/21 0345  WBC 8.3  --  7.1  NEUTROABS 5.6  --   --   HGB 15.4 15.6 14.6  HCT 46.5 46.0 43.4  MCV 89.8  --  88.6  PLT 250  --  215   Basic Metabolic Panel:  Recent Labs  Lab 03/26/21 0345 03/27/21 0500  NA 137 136  K 3.8 4.0  CL 105 108  CO2 27 20*  GLUCOSE 141* 134*  BUN 14 12  CREATININE 0.95 0.90  CALCIUM 9.5 9.2   Lipid Panel:  Recent Labs  Lab 03/26/21 0345  CHOL 154  TRIG 118  HDL 39*  CHOLHDL 3.9  VLDL 24  LDLCALC 91   HgbA1c:  Recent Labs  Lab 03/26/21 0345  HGBA1C 6.4*   Urine Drug Screen:  Recent Labs  Lab 03/25/21 2238  LABOPIA NONE DETECTED  COCAINSCRNUR NONE DETECTED  LABBENZ NONE DETECTED  AMPHETMU NONE DETECTED  THCU NONE DETECTED  LABBARB NONE DETECTED    Alcohol Level  Recent Labs  Lab 03/26/21 0345  ETH <10   Code Stroke CT head: left tempoparietal junction infarct acute vs subacute  MRI brain: multifocal acute infarcts within the posterior left MCA territory  Carotid 03/28/21: minimal right ICA stenosis. 80-89% stenosis of left ICA. Normal vertebral flow   PHYSICAL EXAM General: Well appearing elderly Caucasian male who appears frustrated but not in distress Cardiac: RRR on telemetry with occassional PVCs Neurological Exam:  Mental status: alert and oriented Speech: Moderate expressive aphasia. Speech is  hesitant with significant word finding difficulties and paraphasic errors.  Good comprehension.  Difficulty with naming and repetition. Cranial nerves: EOM and peripheral vision intact. No facial droop. Hearing intact. Tongue midline.  Motor: 5/5 strength in the bilateral upper and lower extremities. Normal bulk and tone of muscle.  Sensation intact throughout.  Normal finger to nose. Normal rapid alternating movement.   ASSESSMENT/PLAN Stephen Mullins is a 75 y.o. male with PMH significant for hyperlipidemia, hypertension, GERD, aortic valve replacement who presented with transient right facial numbness and facial droop and was found to have a left frontoparietal infarct. Symptoms had resolved and NIHHS 0 at time of admission. He did not receive tPA.   Multifocal left MCA territory infarcts due to symptomatic high-grade proximal left ICA stenosis . Residual deficits are expressive aphasia Code Stroke CT head: left tempoparietal junction infarct acute vs subacute MRI brain: multifocal acute infarcts within the posterior left MCA territory Carotid 61: minimal right ICA stenosis. 80-89% stenosis of left ICA. Normal vertebral flow IR angiogram: Preocclusive severe proximal left ICA stenosis 2D Echo left ventricular ejection fraction 60 to 65%.   LDL 91 HgbA1c 6.4 DAPT with Brilinta and aspirin following elective left carotid  stent therapy recommendations:  outpatient SLP and OT Disposition:  pending ongoing evaluation  Hypertension Now outside the window for permissive hypertension. Blood pressure goal pending angiogram findings.   Hyperlipidemia LDL 91, goal < 70 Increase Lipitor to 40 mg daily  Other Stroke Risk Factors Advanced Age >/= 31  Former tobacco user, quit in 2003 CAD. No chest pain on admission. Recent abnormal stress test. He was supposed to undergo a LHC 7/21 however will need to reschedule   Hospital day # 2    Continue close neurological monitoring and strict blood  pressure control with systolic goal 120-140 as per post carotid stent protocol.  IV heparin for the first 24 hours as per post IR protocol.  Close neurological monitoring.  Discussed with patient and   Dr. Corliss Skains .  [name]  Discussed with Dr. Jacqulyn Bath . This patient is critically ill and at significant risk of neurological worsening, death and care requires constant monitoring of vital signs, hemodynamics,respiratory and cardiac monitoring, extensive review of multiple databases, frequent neurological assessment, discussion with family, other specialists and medical decision making of high complexity.I have made any additions or clarifications directly to the above note.This critical care time does not reflect procedure time, or teaching time or supervisory time of PA/NP/Med Resident etc but could involve care discussion time.  I spent 30 minutes of neurocritical care time  in the care of  this patient.       Delia Heady, MD Medical Director [facility] Pager: [phone] 03/28/2021 3:04 PM  To contact Stroke Continuity provider, please refer to [email or web address]. After hours, contact General Neurology

## 2021-03-28 NOTE — Progress Notes (Signed)
ANTICOAGULATION CONSULT NOTE - Initial Consult  Pharmacy Consult for Heparin Indication:  Neuroradiology procedure  Allergies  Allergen Reactions   Tetanus Toxoid, Adsorbed Other (See Comments)    Pt. States as small child he had tetnus made from horse serum and he turned 'blue' ?? SHORTNESS OF BREATH ??    Patient Measurements: Height: 5\' 11"  (180.3 cm) Weight: 88.5 kg (195 lb) IBW/kg (Calculated) : 75.3   Vital Signs: Temp: 98 F (36.7 C) (07/22 1245) Temp Source: Oral (07/22 0400) BP: 140/55 (07/22 1245) Pulse Rate: 62 (07/22 1315)  Labs: Recent Labs    03/25/21 1536 03/25/21 1632 03/26/21 0345 03/27/21 0500 03/28/21 1121  HGB 15.4 15.6 14.6  --   --   HCT 46.5 46.0 43.4  --   --   PLT 250  --  215  --   --   APTT 32  --   --   --  95*  LABPROT 14.0  --   --   --   --   INR 1.1  --   --   --   --   CREATININE 1.09 1.00 0.95 0.90  --     Estimated Creatinine Clearance: 75.5 mL/min (by C-G formula based on SCr of 0.9 mg/dL).   Medical History: Past Medical History:  Diagnosis Date   Bicuspid aortic valve    with aortic stenosis and mild insufficiency, mild to moderate aortic root dilitation   Borderline hyperlipidemia    Diabetes mellitus without complication (HCC)    Elevated fasting glucose    Elevated PSA    due to prostatitis   GERD (gastroesophageal reflux disease)    Heart murmur    Hypertension    Nail fungus    Thoracic ascending aortic aneurysm [facility])       Assessment: 75 y.o. male admitted with slurred speech and right facial droop and right UE weakness. S/p revascularization with stent placement. Pharmacy consulted for Heparin management.  Goal of Therapy:  Heparin level 0.1 - 0.25 units/ml Monitor platelets by anticoagulation protocol: Yes   Plan:  Increase Heparin to 800 Units/ hour to end @ 1230 7/23 Check Heparin level in 6 hours   8/23, PharmD, [facility] Clinical Pharmacist Please see AMION for all Pharmacists' Contact  Phone Numbers 03/28/2021, 1:36 PM

## 2021-03-28 NOTE — H&P (Signed)
The patient has had a H&P performed within the last 30 days, all history, medications, and exam have been reviewed. The patient denies any interval changes since the H&P.  Has been receiving Brilinta 90 mg bid and ASA 81 mg daily as ordered, including this morning. BP (!) 114/56   Pulse (!) 58   Temp 98.9 F (37.2 C) (Oral)   Resp 18   Ht 5\' 11"  (1.803 m)   Wt 88.5 kg   SpO2 94%   BMI 27.20 kg/m  Lungs: CTA without w/r/r Heart: Regular  Proceed with angiogram and (L)ICA stent angioplasty Consent has already been signed, pt has no further questions about procedure.   PA-C Interventional Radiology 03/28/2021 8:13 AM

## 2021-03-28 NOTE — Progress Notes (Signed)
PT Cancellation Note  Patient Details Name: Stephen Mullins MRN: [id number] DOB: 07-28-1946   Cancelled Treatment:    Reason Eval/Treat Not Completed: Patient at procedure or test/unavailable. Pt in procedure for angiogram and (L)ICA stent angioplasty currently. Will follow-up as time permits and as appropriate.   [name], PT, DPT Acute Rehabilitation Services  Pager: [phone] Office: [phone]    [name] 03/28/2021, 8:46 AM

## 2021-03-28 NOTE — Anesthesia Procedure Notes (Signed)
Procedure Name: Intubation Date/Time: 03/28/2021 8:26 AM Performed by: Adria Dill, CRNA Pre-anesthesia Checklist: Patient identified, Emergency Drugs available, Suction available and Patient being monitored Patient Re-evaluated:Patient Re-evaluated prior to induction Oxygen Delivery Method: Circle system utilized Preoxygenation: Pre-oxygenation with 100% oxygen Induction Type: IV induction Ventilation: Mask ventilation without difficulty Laryngoscope Size: Miller and 3 Grade View: Grade I Tube type: Oral Tube size: 7.5 mm Number of attempts: 1 Airway Equipment and Method: Stylet and Oral airway Placement Confirmation: ETT inserted through vocal cords under direct vision, positive ETCO2 and breath sounds checked- equal and bilateral Secured at: 22 cm Tube secured with: Tape Dental Injury: Teeth and Oropharynx as per pre-operative assessment

## 2021-03-28 NOTE — Sedation Documentation (Signed)
6 Fr Angioseal deployed 

## 2021-03-28 NOTE — Progress Notes (Signed)
Report called to [facility] in short stay at 0705.

## 2021-03-28 NOTE — Progress Notes (Signed)
OT Cancellation Note  Patient Details Name: SHEIKH LEVERICH MRN: [id number] DOB: Nov 03, 1945   Cancelled Treatment:    Reason Eval/Treat Not Completed: Patient at procedure or test/ unavailable (IR currently)  Mateo Flow 03/28/2021, 7:47 AM  Timmothy Euler, OTR/L  Acute Rehabilitation Services Pager: [phone] Office: [phone] .

## 2021-03-28 NOTE — Sedation Documentation (Signed)
Pt transported to PACU via bed accompanied by CRNA and RN. PACU RN at bedside to receive pt. Right radial site remains level 0 with TR Band in place, Right groin remains level 0. Bilateral lower extremity distal pulses palpable. Pt awake and responds appropriately, following commands. No s/s of distress at this time.

## 2021-03-28 NOTE — Progress Notes (Signed)
ANTICOAGULATION CONSULT NOTE - Follow-up Consult  Pharmacy Consult for Heparin Indication:  Neuroradiology procedure  Allergies  Allergen Reactions   Tetanus Toxoid, Adsorbed Other (See Comments)    Pt. States as small child he had tetnus made from horse serum and he turned 'blue' ?? SHORTNESS OF BREATH ??    Patient Measurements: Height: 5\' 11"  (180.3 cm) Weight: 88.5 kg (195 lb) IBW/kg (Calculated) : 75.3   Vital Signs: Temp: 98.7 F (37.1 C) (07/22 2033) Temp Source: Oral (07/22 2033) BP: 118/65 (07/22 2000) Pulse Rate: 67 (07/22 2000)  Labs: Recent Labs    03/26/21 0345 03/27/21 0500 03/28/21 1121 03/28/21 2000  HGB 14.6  --   --   --   HCT 43.4  --   --   --   PLT 215  --   --   --   APTT  --   --  95*  --   HEPARINUNFRC  --   --   --  <0.10*  CREATININE 0.95 0.90  --   --      Estimated Creatinine Clearance: 75.5 mL/min (by C-G formula based on SCr of 0.9 mg/dL).   Medical History: Past Medical History:  Diagnosis Date   Bicuspid aortic valve    with aortic stenosis and mild insufficiency, mild to moderate aortic root dilitation   Borderline hyperlipidemia    Diabetes mellitus without complication (HCC)    Elevated fasting glucose    Elevated PSA    due to prostatitis   GERD (gastroesophageal reflux disease)    Heart murmur    Hypertension    Nail fungus    Thoracic ascending aortic aneurysm [facility])       Assessment: 75 y.o. male admitted with slurred speech and right facial droop and right UE weakness. S/p revascularization with stent placement. Pharmacy consulted for Heparin management.  Heparin level this evening is undetectable (HL<0.1, goal of 0.1-0.25). Slight bleeding around TR band site - no other noted. Will clarify off time in the AM - typically around 0700 for sheath removal.   Goal of Therapy:  Heparin level 0.1 - 0.25 units/ml Monitor platelets by anticoagulation protocol: Yes   Plan:  - Increase Heparin to 900 units/hr - Will  continue to monitor for any signs/symptoms of bleeding and will follow up with heparin level in 8 hours   Thank you for allowing pharmacy to be a part of this patient's care.  10-28-1975, PharmD, BCPS Clinical Pharmacist Clinical phone for 03/28/2021: [phone] 03/28/2021 8:50 PM   **Pharmacist phone directory can now be found on [email or web address] ([email or web address]).  Listed under [facility].

## 2021-03-28 NOTE — Procedures (Signed)
S/P  prox LICA revascularization with stent assisted angioplasty with distal protection for symptomatic stenosis. Rt rad route for angioplasty with distal protection and RT CFA route for stenting with distal protection . Placement of stent vis rad route unsucceessful due to vessel anatomy. Post CT No ICH  Dital rad pulse and distal pedal pulses intact.38F angioseal for hemostasis at the CFA  puncture site. Extubated.  Pupils 2 to 53mm RT = LT  Moves all 4s equally and to command. Follows simple instructions  appropriately. S.Yoshino Broccoli MD

## 2021-03-28 NOTE — Progress Notes (Addendum)
Bladder scan revealed >652ml urine retained; unable to pass I&O cath, MD P. Sethi made aware 1435; order for coude I&O; unable to pass coude.      R radial site oozing slightly, this RN adding air back to TR band.   MD S. Deveshwar made aware 1445, orders to place coude foley (urology to be consulted).

## 2021-03-28 NOTE — Consult Note (Signed)
Urology Consult  Consulting MD: Kerby Nora, MD  CC: Difficult catheter placement  HPI: This is a 75year old male admitted to neuro ICU following  intracranial stenting for recent ischemic stroke.  Patient is on antibiotic platelet therapy, bedrest.  He has been unable to void and has a residual urine volume of over 600 mL.  Apparently, catheter placement attempted x2 and unsuccessful.  Urologic consultation requested.  Patient does have mild expressive aphasia but denies any prior history of BPH, voiding dysfunction, urologic surgery.  PMH: Past Medical History:  Diagnosis Date   Bicuspid aortic valve    with aortic stenosis and mild insufficiency, mild to moderate aortic root dilitation   Borderline hyperlipidemia    Diabetes mellitus without complication (HCC)    Elevated fasting glucose    Elevated PSA    due to prostatitis   GERD (gastroesophageal reflux disease)    Heart murmur    Hypertension    Nail fungus    Thoracic ascending aortic aneurysm (HCC)     PSH: Past Surgical History:  Procedure Laterality Date   AORTIC VALVE REPLACEMENT N/A 12/20/2017   Procedure: AORTIC VALVE REPLACEMENT (AVR);  Surgeon: [name], [name], MD;  Location: [facility] OR;  Service: Open Heart Surgery;  Laterality: N/A;   CATARACT EXTRACTION, BILATERAL     IR ANGIO EXTERNAL CAROTID SEL EXT CAROTID BILAT MOD SED  03/26/2021   IR ANGIO VERTEBRAL SEL SUBCLAVIAN INNOMINATE BILAT MOD SED  03/26/2021   RIGHT/LEFT HEART CATH AND CORONARY ANGIOGRAPHY N/A 11/09/2017   Procedure: RIGHT/LEFT HEART CATH AND CORONARY ANGIOGRAPHY;  Surgeon: [name], MD;  Location: [facility] INVASIVE CV LAB;  Service: Cardiovascular;  Laterality: N/A;   TEE WITHOUT CARDIOVERSION N/A 12/20/2017   Procedure: TRANSESOPHAGEAL ECHOCARDIOGRAM (TEE);  Surgeon: [name], [name], MD;  Location: [facility] OR;  Service: Open Heart Surgery;  Laterality: N/A;    Allergies: Allergies  Allergen Reactions   Tetanus Toxoid, Adsorbed Other (See  Comments)    Pt. States as small child he had tetnus made from horse serum and he turned 'blue' ?? SHORTNESS OF BREATH ??    Medications: Medications Prior to Admission  Medication Sig Dispense Refill Last Dose   acetaminophen (TYLENOL) 500 MG tablet Take 2 tablets (1,000 mg total) by mouth every 6 (six) hours as needed. (Patient taking differently: Take 1,000 mg by mouth every 6 (six) hours as needed (PAIN).) 30 tablet 0 Past Week   amoxicillin (AMOXIL) 500 MG capsule TAKE 4 TABLETS BY MOUTH 1 HOUR BEFORE DENTAL PROCEDURE (Patient taking differently: Take 2,000 mg by mouth See admin instructions. TAKE 4 TABLETS (2000 MG) BY MOUTH 1 HOUR BEFORE DENTAL PROCEDURE) 4 capsule 3 4 months ago   aspirin EC 81 MG tablet Take 81 mg by mouth in the morning. Swallow whole.   03/25/2021   atorvastatin (LIPITOR) 10 MG tablet Take 10 mg by mouth in the morning.   03/25/2021   Efinaconazole 10 % SOLN Apply 1 drop topically daily. (Patient taking differently: Apply 1 drop topically daily. APPLIED TO TOE NAILS) 4 mL 11 03/25/2021   hydrochlorothiazide (HYDRODIURIL) 12.5 MG tablet Take 1 tablet by mouth once daily (Patient taking differently: Take 12.5 mg by mouth in the morning.) 90 tablet 3 03/25/2021   hydrocortisone cream 1 % Apply 1 application topically daily as needed for itching.   2 months ago   lisinopril (ZESTRIL) 5 MG tablet Take 5 mg by mouth in the morning.   03/25/2021   neomycin-bacitracin-polymyxin (NEOSPORIN) ointment Apply  1 application topically daily as needed for wound care.   Past Month   oxymetazoline (AFRIN) 0.05 % nasal spray Place 1 spray into both nostrils daily as needed for congestion.   Past Week   NONFORMULARY OR COMPOUNDED ITEM [facility]: Antifungal nail lacquer - Fluconazole 2%, Terbinafine 1%, DMSO. (Patient not taking: No sig reported) 120 each 3 Not Taking     Social History: Social History   Socioeconomic History   Marital status: Married    Spouse name: Not on file    Number of children: Not on file   Years of education: Not on file   Highest education level: Not on file  Occupational History   Occupation: retired  Tobacco Use   Smoking status: Former    Types: Cigarettes    Quit date: 10/20/2001    Years since quitting: 19.4   Smokeless tobacco: Never  Vaping Use   Vaping Use: Never used  Substance and Sexual Activity   Alcohol use: Yes    Alcohol/week: 1.0 - 2.0 standard drink    Types: 1 - 2 Glasses of wine per week   Drug use: No   Sexual activity: Not on file  Other Topics Concern   Not on file  Social History Narrative   Married 55 yrs (as of July 2022). Enjoys golf.     Social Determinants of Health   Financial Resource Strain: Not on file  Food Insecurity: Not on file  Transportation Needs: Not on file  Physical Activity: Not on file  Stress: Not on file  Social Connections: Not on file  Intimate Partner Violence: Not on file    Family History: Family History  Problem Relation Age of Onset   Prostate cancer Father      Physical Exam: @VITALS2 @ General: No acute distress.  Awake. Head:  Normocephalic.  Atraumatic. ENT:  EOMI.  Mucous membranes moist Neck:  Supple.  No lymphadenopathy. CV:  Regular rate. Pulmonary: Equal effort bilaterally.  Skin:  Normal turgor.  No visible rash. Extremity: No gross deformity of extremities.  Neurologic: Alert. Appropriate mood. Penis:  Circumcised.  No lesions. Urethra: Orthotopic meatus.  Studies:  Recent Labs    03/26/21 0345  HGB 14.6  WBC 7.1  PLT 215    Recent Labs    03/26/21 0345 03/27/21 0500  NA 137 136  K 3.8 4.0  CL 105 108  CO2 27 20*  BUN 14 12  CREATININE 0.95 0.90  CALCIUM 9.5 9.2  GFRNONAA >60 >60     Recent Labs    03/28/21 1121  APTT 95*     Invalid input(s): ABG  Following verbal consent by the patient, penis was prepped and draped.  Approximately 10 cc of K-Y jelly administered within his urethra.  16 French coud tip catheter  easily placed, return to clear urine, 10 cc of water placed in balloon and catheter was hooked to dependent drainage.  Prior to catheter placement, he did void spontaneously.  Assessment: Apparently, difficult catheter placement, catheter placed easily today  Plan: As patient is at bedrest, probably worthwhile to leave catheter in.  Remove at your leisure.    Pager:[phone]

## 2021-03-28 NOTE — Anesthesia Postprocedure Evaluation (Signed)
Anesthesia Post Note  Patient: Stephen Mullins  Procedure(s) Performed: [facility] PLACMENT     Patient location during evaluation: PACU Anesthesia Type: General Level of consciousness: awake and alert and oriented Pain management: pain level controlled Vital Signs Assessment: post-procedure vital signs reviewed and stable Respiratory status: spontaneous breathing, nonlabored ventilation and respiratory function stable Cardiovascular status: blood pressure returned to baseline and stable Postop Assessment: no apparent nausea or vomiting Anesthetic complications: no   No notable events documented.   Vitals:   03/28/21 1245  TempSrc:   PainSc: 0-No pain                 Sieanna Vanstone A.

## 2021-03-28 NOTE — Progress Notes (Signed)
Seen post op Doing pretty well. No c/o HA, N/V Had some oozing from right radial access, air added back to TR band per RN. RT CFA access without issue.  Also having urinary retention, >624mL on bladder scan. Unable to void on own. RN reports unsuccessful attempts with I&O/coude catheter insertion  A&O x 3 Still with some expressive aphasia. No drift PERRLA Face symmetric. (R) hand warm, TR band in place (R)CFA dressing dry, soft   S/p prox LICA revascularization with stent assisted angioplasty. Cont Hep gtt Brilinta/ASA as ordered. Will consult Urology for urinary retention/catheter insertion assistance.   Brayton El PA-C Interventional Radiology 03/28/2021 3:29 PM

## 2021-03-29 DIAGNOSIS — I2510 Atherosclerotic heart disease of native coronary artery without angina pectoris: Secondary | ICD-10-CM | POA: Diagnosis not present

## 2021-03-29 DIAGNOSIS — H34232 Retinal artery branch occlusion, left eye: Secondary | ICD-10-CM

## 2021-03-29 DIAGNOSIS — I639 Cerebral infarction, unspecified: Secondary | ICD-10-CM | POA: Diagnosis not present

## 2021-03-29 DIAGNOSIS — I771 Stricture of artery: Secondary | ICD-10-CM

## 2021-03-29 DIAGNOSIS — I63232 Cerebral infarction due to unspecified occlusion or stenosis of left carotid arteries: Principal | ICD-10-CM

## 2021-03-29 DIAGNOSIS — I10 Essential (primary) hypertension: Secondary | ICD-10-CM | POA: Diagnosis not present

## 2021-03-29 DIAGNOSIS — E7800 Pure hypercholesterolemia, unspecified: Secondary | ICD-10-CM | POA: Diagnosis not present

## 2021-03-29 LAB — CBC WITH DIFFERENTIAL/PLATELET
Abs Immature Granulocytes: 0.06 10*3/uL (ref 0.00–0.07)
Basophils Absolute: 0.1 10*3/uL (ref 0.0–0.1)
Basophils Relative: 0 %
Eosinophils Absolute: 0.1 10*3/uL (ref 0.0–0.5)
Eosinophils Relative: 1 %
HCT: 38.7 % — ABNORMAL LOW (ref 39.0–52.0)
Hemoglobin: 13 g/dL (ref 13.0–17.0)
Immature Granulocytes: 1 %
Lymphocytes Relative: 15 %
Lymphs Abs: 1.8 10*3/uL (ref 0.7–4.0)
MCH: 30.1 pg (ref 26.0–34.0)
MCHC: 33.6 g/dL (ref 30.0–36.0)
MCV: 89.6 fL (ref 80.0–100.0)
Monocytes Absolute: 0.9 10*3/uL (ref 0.1–1.0)
Monocytes Relative: 8 %
Neutro Abs: 8.5 10*3/uL — ABNORMAL HIGH (ref 1.7–7.7)
Neutrophils Relative %: 75 %
Platelets: 198 10*3/uL (ref 150–400)
RBC: 4.32 MIL/uL (ref 4.22–5.81)
RDW: 12.7 % (ref 11.5–15.5)
WBC: 11.4 10*3/uL — ABNORMAL HIGH (ref 4.0–10.5)
nRBC: 0 % (ref 0.0–0.2)

## 2021-03-29 LAB — BASIC METABOLIC PANEL
Anion gap: 5 (ref 5–15)
BUN: 14 mg/dL (ref 8–23)
CO2: 22 mmol/L (ref 22–32)
Calcium: 8.9 mg/dL (ref 8.9–10.3)
Chloride: 112 mmol/L — ABNORMAL HIGH (ref 98–111)
Creatinine, Ser: 0.8 mg/dL (ref 0.61–1.24)
GFR, Estimated: >60 mL/min (ref >60)
Glucose, Bld: 126 mg/dL — ABNORMAL HIGH (ref 70–99)
Potassium: 3.6 mmol/L (ref 3.5–5.1)
Sodium: 139 mmol/L (ref 135–145)

## 2021-03-29 LAB — HEPARIN LEVEL (UNFRACTIONATED): Heparin Unfractionated: <0.1 IU/mL — ABNORMAL LOW (ref 0.30–0.70)

## 2021-03-29 LAB — GLUCOSE, CAPILLARY: Glucose-Capillary: 138 mg/dL — ABNORMAL HIGH (ref 70–99)

## 2021-03-29 MED ORDER — TAMSULOSIN HCL 0.4 MG PO CAPS
0.4000 mg | ORAL_CAPSULE | Freq: Every day | ORAL | Status: DC
  Administered 2021-03-30: 0.4 mg via ORAL
  Filled 2021-03-29: qty 1

## 2021-03-29 MED ORDER — HYDROCHLOROTHIAZIDE 25 MG PO TABS
12.5000 mg | ORAL_TABLET | Freq: Every day | ORAL | Status: DC
  Administered 2021-03-29: 12.5 mg via ORAL
  Filled 2021-03-29: qty 1

## 2021-03-29 MED ORDER — ALUM & MAG HYDROXIDE-SIMETH 200-200-20 MG/5ML PO SUSP
15.0000 mL | ORAL | Status: DC | PRN
  Administered 2021-03-29: 15 mL via ORAL
  Filled 2021-03-29: qty 30

## 2021-03-29 MED ORDER — TAMSULOSIN HCL 0.4 MG PO CAPS
0.8000 mg | ORAL_CAPSULE | Freq: Once | ORAL | Status: AC
  Administered 2021-03-29: 0.8 mg via ORAL
  Filled 2021-03-29: qty 2

## 2021-03-29 MED ORDER — ATORVASTATIN CALCIUM 40 MG PO TABS
40.0000 mg | ORAL_TABLET | Freq: Every morning | ORAL | Status: DC
  Administered 2021-03-30: 40 mg via ORAL
  Filled 2021-03-29: qty 1

## 2021-03-29 MED ORDER — HEPARIN (PORCINE) 25000 UT/250ML-% IV SOLN
900.0000 [IU]/h | INTRAVENOUS | Status: DC
  Filled 2021-03-29: qty 250

## 2021-03-29 MED ORDER — LISINOPRIL 5 MG PO TABS: 5.0000 mg | ORAL_TABLET | Freq: Every morning | ORAL | Status: DC

## 2021-03-29 MED ORDER — LABETALOL HCL 5 MG/ML IV SOLN: 5.0000 mg | INTRAVENOUS | Status: DC | PRN

## 2021-03-29 NOTE — Progress Notes (Signed)
STROKE TEAM PROGRESS NOTE   INTERVAL HISTORY Wife and RN are at the bedside. Pt had left ICA stenting yesterday, doing well. Still complaining persistent left eye central vision crescent shape vision loss, which started at the same time of recent stroke, likely BRAO due to left ICA stenosis. Now on ASA and brilinta. Pending PT/OT   Vitals:   03/29/21 0200 03/29/21 0300 03/29/21 0400 03/29/21 0500  BP: (!) 111/59 123/73 (!) 131/55 118/66  Pulse: (!) 54 61 (!) 58 (!) 57  Resp: 15 15 (!) 24 13  Temp:      TempSrc:      SpO2: 94% 97% 97% 95%  Weight:      Height:       CBC:  Recent Labs  Lab 03/25/21 1536 03/25/21 1632 03/26/21 0345  WBC 8.3  --  7.1  NEUTROABS 5.6  --   --   HGB 15.4 15.6 14.6  HCT 46.5 46.0 43.4  MCV 89.8  --  88.6  PLT 250  --  215   Basic Metabolic Panel:  Recent Labs  Lab 03/26/21 0345 03/27/21 0500  NA 137 136  K 3.8 4.0  CL 105 108  CO2 27 20*  GLUCOSE 141* 134*  BUN 14 12  CREATININE 0.95 0.90  CALCIUM 9.5 9.2   Lipid Panel:  Recent Labs  Lab 03/26/21 0345  CHOL 154  TRIG 118  HDL 39*  CHOLHDL 3.9  VLDL 24  LDLCALC 91   HgbA1c:  Recent Labs  Lab 03/26/21 0345  HGBA1C 6.4*   Urine Drug Screen:  Recent Labs  Lab 03/25/21 2238  LABOPIA NONE DETECTED  COCAINSCRNUR NONE DETECTED  LABBENZ NONE DETECTED  AMPHETMU NONE DETECTED  THCU NONE DETECTED  LABBARB NONE DETECTED    Alcohol Level  Recent Labs  Lab 03/26/21 0345  ETH <10   Code Stroke CT head: left tempoparietal junction infarct acute vs subacute  MRI brain: multifocal acute infarcts within the posterior left MCA territory  Carotid US: minimal right ICA stenosis. 80-89% stenosis of left ICA. Normal vertebral flow  Dr Corliss Skains 03/28/21  S/P  prox LICA revascularization with stent assisted angioplasty with distal protection for symptomatic stenosis. Rt rad route for angioplasty with distal protection and RT CFA route for stenting with distal protection  . Placement of stent vis rad route unsucceessful due to vessel anatomy.   PHYSICAL EXAM  Temp:  [98 F (36.7 C)-98.8 F (37.1 C)] 98.8 F (37.1 C) (07/23 0800) Pulse Rate:  [53-70] 61 (07/23 1030) Resp:  [10-25] 14 (07/23 1030) BP: (107-166)/(45-82) 114/57 (07/23 1030) SpO2:  [94 %-100 %] 94 % (07/23 1030) Arterial Line BP: (64-149)/(41-87) 91/87 (07/23 0400)  General - Well nourished, well developed, in no apparent distress.  Ophthalmologic - fundi not visualized due to noncooperation.  Cardiovascular - Regular rhythm and rate.  Mental Status -  Level of arousal and orientation to time, place, and person were intact. Language including expression, naming, repetition, comprehension was assessed and found intact.  Cranial Nerves II - XII - II - Visual field intact OU. Left eye crescent shaped vision loss in the central vision.  III, IV, VI - Extraocular movements intact. V - Facial sensation intact bilaterally. VII - Facial movement intact bilaterally. VIII - Hearing & vestibular intact bilaterally. X - Palate elevates symmetrically. XI - Chin turning & shoulder shrug intact bilaterally. XII - Tongue protrusion intact.  Motor Strength - The patient's strength was normal in all extremities and pronator drift  was absent. Mild giveaway weakness on the right keen flexion, but no weakness on other right leg muscle strength testing. Bulk was normal and fasciculations were absent.   Motor Tone - Muscle tone was assessed at the neck and appendages and was normal.  Reflexes - The patient's reflexes were symmetrical in all extremities and he had no pathological reflexes.  Sensory - Light touch, temperature/pinprick were assessed and were symmetrical.    Coordination - The patient had normal movements in the hands and feet with no ataxia or dysmetria.  Tremor was absent.  Gait and Station - deferred.    ASSESSMENT/PLAN Stephen Mullins is a 75 y.o. male with PMH significant for  hyperlipidemia, hypertension, GERD, aortic valve replacement who presented with transient right facial numbness and facial droop and was found to have a left frontoparietal infarct. Symptoms had resolved and NIHHS 0 at time of admission. He did not receive tPA.   Stroke - multifocal left MCA territory infarcts likely due to symptomatic high-grade proximal left ICA stenosis CT head: left tempoparietal junction infarct acute vs subacute MRI brain: multifocal acute infarcts within the posterior left MCA territory MRA head unremarkable Carotid US: 80-89% stenosis of left ICA.  IR angiogram: Preocclusive severe proximal left ICA stenosis s/p stent 2D Echo EF 60 to 65%.   LDL 91 HgbA1c 6.4 On aspirin 81 PTA, now on DAPT with Brilinta and aspirin following left carotid stent  Therapy recommendations:  outpatient SLP and OT Deposition - pending  BRAO Patient also complaining of left central vision crescent-shaped visual loss Occurred along with stroke symptoms Also likely due to symptomatic high-grade left ICA proximal stenosis Status post ICA stenting On aspirin and Brilinta Follow-up with ophthalmology as outpatient  Hypertension Home meds including HCTZ and lisinopril Stable on the low side Now on no BP meds Blood pressure goal < 180/105. Long-term BP goal normotensive  Hyperlipidemia Home meds Lipitor 10 LDL 91, goal < 70 Increase Lipitor to 40 mg daily Continue statin on discharge  Other Stroke Risk Factors Advanced Age >/= 5  Former tobacco user, quit in 2003 CAD. Recent abnormal stress test. He was supposed to undergo a [facility] 7/21 however will need to reschedule AVR  Other acute issues    Hospital day # 3  I discussed with Dr. Jacqulyn Bath. I spent  35 minutes in total face-to-face time with the patient, more than 50% of which was spent in counseling and coordination of care, reviewing test results, images and medication, and discussing the diagnosis, treatment plan and  potential prognosis. This patient's care requiresreview of multiple databases, neurological assessment, discussion with family, other specialists and medical decision making of high complexity. I had long discussion with wife and patient at bedside, updated pt current condition, treatment plan and potential prognosis, and answered all the questions.  They expressed understanding and appreciation.   Marvel Plan, MD PhD Stroke Neurology 03/29/2021 5:04 PM    To contact Stroke Continuity provider, please refer to [email or web address]. After hours, contact General Neurology

## 2021-03-29 NOTE — Progress Notes (Signed)
PROGRESS NOTE    Stephen Mullins  IWO:[id number] DOB: 27-Apr-1946 DOA: 03/25/2021 PCP: Soundra Pilon, FNP   Brief Narrative:  Stephen Mullins is a 75 y.o. 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  Symptoms completely resolved within approximately 10 to 15 minutes and then shortly after return for approximately 1 minute.  He was completely normal by the time he arrived to the ED.  Did not have any associated complaints.  He was hemodynamically stable and EKG features sinus rhythm with PVC and ST depressions.  Head CT with focal hypodensity at the left temporoparietal junction consistent with area of edema most likely related to acute or subacute infarction.  Neurology was consulted by the ED physician and admitted to hospitalist service. MRI confirmed multifocal acute infarct within the posterior left MCA territory. Also complains of left visual deficit.  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 Acute ischemic CVA/symptomatic high-grade proximal left ICA stenosis: - Presents after an episode of dysarthria and right facial numbness and weakness that has resolved.  MRI confirms multifocal acute infarct within the posterior  left MCA territory.  He was started on aspirin and Plavix.  Patient with expressive aphasia/word finding difficulty.  Also complains of left visual deficit.  Carotid ultrasound shows minimal right ICA stenosis but 80 to 89% left ICA stenosis.  Neurology on board who has consulted radiology and patient underwent revascularization of the left ICA stenosis with a stent placement by Dr. Corliss Skains on 03/28/2021.  DAPT changed to aspirin plus Brilinta.  Hemoglobin A1c 6.4.  Patient states that he feels better, still has expressive aphasia but much better than yesterday.  No other deficit.  Neurology on board.  Increasing atorvastatin from 10 mg to 40 mg.   2. CAD: No anginal complaints. Had mild CAD in 2019, recent abnormal stress test and was planned for heart cath on 7/21. Continue antiplatelets, statin; no longer on beta-blocker d/t bradycardia     3. Hypertension: Out of the permissive hypertension window.  Per neurology, goal systolic blood pressure between 120-140.  He is at goal.  We will resume his home dose of lisinopril and hydrochlorothiazide.   4. Hyperlipidemia  - Continue Lipitor, increased to 40 mg.   5. Thoracic aortic aneurysm  - Asymptomatic, continue CT surgery follow-up as planned.   6.  Acute urinary retention: Patient had urinary retention postprocedure 03/28/2021.  Nurses were unable to anchor Foley catheter.  Urology consulted, Foley was placed.  Per urology, this can be removed anytime.  Will remove Foley catheter today.  Will provide with Flomax.  DVT prophylaxis:    Code Status: Full Code  Family Communication: Wife present at bedside.  Plan of care discussed with patient and his wife in length and  he verbalized understanding and agreed with it.  Status is: Inpatient  Remains inpatient appropriate because:Ongoing diagnostic testing needed not appropriate for outpatient work up  Dispo: The patient is from: Home              Anticipated d/c is to: Home              Patient  currently is not medically stable to d/c.   Difficult to place patient No        Estimated body mass index is 27.2 kg/m as calculated from the following:   Height as of this encounter: 5\' 11"  (1.803 m).   Weight as of this encounter: 88.5 kg.      Nutritional status:               Consultants:  Neurology and IR  Procedures:  None  Antimicrobials:  Anti-infectives (From admission, onward)    Start     Dose/Rate Route Frequency Ordered Stop   03/28/21 0804  ceFAZolin (ANCEF) 2-4 GM/100ML-% IVPB       Note to Pharmacy: 03/30/21   : cabinet override      03/28/21 0804 03/28/21 2014          Subjective: Seen and examined.  Still with expressive aphasia but he states that he is much improved and he does look much improved.  Wife at the bedside.  No other complaint.  Objective: Vitals:   03/29/21 0900 03/29/21 0930 03/29/21 1000 03/29/21 1030  BP: 134/68 (!) 120/55 (!) 112/55 (!) 114/57  Pulse: 60 62 63 61  Resp: 18 20 18 14   Temp:      TempSrc:      SpO2: 94% 96% 95% 94%  Weight:      Height:        Intake/Output Summary (Last 24 hours) at 03/29/2021 1138 Last data filed at 03/29/2021 1000 Gross per 24 hour  Intake 1961.35 ml  Output 1175 ml  Net 786.35 ml    Filed Weights   03/25/21 1521  Weight: 88.5 kg    Examination:  General exam: Appears calm and comfortable  Respiratory system: Clear to auscultation. Respiratory effort normal. Cardiovascular system: S1 & S2 heard, RRR. No JVD, murmurs, rubs, gallops or clicks. No pedal edema. Gastrointestinal system: Abdomen is nondistended, soft and nontender. No organomegaly or masses felt. Normal bowel sounds heard. Central nervous system: Alert and oriented. No focal neurological deficits other than expressive aphasia which is mild. Extremities: Symmetric 5 x 5 power. Skin: No rashes, lesions or ulcers.  Psychiatry: Judgement and insight appear normal. Mood & affect appropriate.   Data  Reviewed: I have personally reviewed following labs and imaging studies  CBC: Recent Labs  Lab 03/24/21 1037 03/25/21 1536 03/25/21 1632 03/26/21 0345 03/29/21 0645  WBC 6.9 8.3  --  7.1 11.4*  NEUTROABS  --  5.6  --   --  8.5*  HGB 15.4 15.4 15.6 14.6 13.0  HCT 45.9 46.5 46.0 43.4 38.7*  MCV 89 89.8  --  88.6 89.6  PLT 239 250  --  215 198    Basic Metabolic Panel: Recent Labs  Lab 03/24/21 1037 03/25/21 1536 03/25/21 1632 03/26/21 0345 03/27/21 0500 03/29/21 0645  NA 139 139 141 137 136 139  K 4.3 4.2 4.1 3.8 4.0 3.6  CL 102 103 105 105 108 112*  CO2 30* 28  --  27 20* 22  GLUCOSE 121* 113* 108* 141* 134* 126*  BUN 15 16  19 14 12 14   CREATININE 0.83 1.09 1.00 0.95 0.90 0.80  CALCIUM 10.5* 10.0  --  9.5 9.2 8.9    GFR: Estimated Creatinine Clearance: 85 mL/min (by C-G formula based on SCr of 0.8 mg/dL). Liver Function Tests: Recent Labs  Lab 03/25/21 1536  AST 23  ALT 24  ALKPHOS 43  BILITOT 0.6  PROT 6.8  ALBUMIN 4.0    No results for input(s): LIPASE, AMYLASE in the last 168 hours. No results for input(s): AMMONIA in the last 168 hours. Coagulation Profile: Recent Labs  Lab 03/25/21 1536  INR 1.1    Cardiac Enzymes: No results for input(s): CKTOTAL, CKMB, CKMBINDEX, TROPONINI in the last 168 hours. BNP (last 3 results) No results for input(s): PROBNP in the last 8760 hours. HbA1C: No results for input(s): HGBA1C in the last 72 hours.  CBG: Recent Labs  Lab 03/27/21 1130 03/28/21 1143  GLUCAP 130* 165*    Lipid Profile: No results for input(s): CHOL, HDL, LDLCALC, TRIG, CHOLHDL, LDLDIRECT in the last 72 hours.  Thyroid Function Tests: No results for input(s): TSH, T4TOTAL, FREET4, T3FREE, THYROIDAB in the last 72 hours. Anemia Panel: No results for input(s): VITAMINB12, FOLATE, FERRITIN, TIBC, IRON, RETICCTPCT in the last 72 hours. Sepsis Labs: No results for input(s): PROCALCITON, LATICACIDVEN in the last 168 hours.  Recent  Results (from the past 240 hour(s))  Resp Panel by RT-PCR (Flu A&B, Covid) Nasopharyngeal Swab     Status: None   Collection Time: 03/25/21  7:31 PM   Specimen: Nasopharyngeal Swab; Nasopharyngeal(NP) swabs in vial transport medium  Result Value Ref Range Status   SARS Coronavirus 2 by RT PCR NEGATIVE NEGATIVE Final    Comment: (NOTE) SARS-CoV-2 target nucleic acids are NOT DETECTED.  The SARS-CoV-2 RNA is generally detectable in upper respiratory specimens during the acute phase of infection. The lowest concentration of SARS-CoV-2 viral copies this assay can detect is 138 copies/mL. A negative result does not preclude SARS-Cov-2 infection and should not be used as the sole basis for treatment or other patient management decisions. A negative result may occur with  improper specimen collection/handling, submission of specimen other than nasopharyngeal swab, presence of viral mutation(s) within the areas targeted by this assay, and inadequate number of viral copies(<138 copies/mL). A negative result must be combined with clinical observations, patient history, and epidemiological information. The expected result is Negative.  Fact Sheet for Patients:  03/27/21  Fact Sheet for Healthcare Providers:  BloggerCourse.com  This test is no t yet approved or cleared by the [employer] FDA and  has been authorized for detection and/or diagnosis of SARS-CoV-2 by FDA under an Emergency Use Authorization (EUA). This EUA will remain  in effect (meaning this test can be used) for the duration of the COVID-19 declaration under Section 564(b)(1) of the Act, 21 U.S.C.section 360bbb-3(b)(1), unless the authorization is terminated  or revoked sooner.       Influenza A by PCR NEGATIVE NEGATIVE Final   Influenza B by PCR NEGATIVE NEGATIVE Final    Comment: (NOTE) The Xpert Xpress SARS-CoV-2/FLU/RSV plus assay is intended as an aid in the  diagnosis of influenza from Nasopharyngeal swab specimens and should not be used as a sole basis for treatment. Nasal washings and aspirates are unacceptable for Xpert Xpress SARS-CoV-2/FLU/RSV testing.  Fact Sheet for Patients: [place]  Fact Sheet for Healthcare Providers: BloggerCourse.com  This test is not yet approved or cleared by the [employer].[place] FDA and has been authorized for detection and/or  diagnosis of SARS-CoV-2 by FDA under an Emergency Use Authorization (EUA). This EUA will remain in effect (meaning this test can be used) for the duration of the COVID-19 declaration under Section 564(b)(1) of the Act, 21 U.S.C. section 360bbb-3(b)(1), unless the authorization is terminated or revoked.  Performed at [facility], [address] [place], [place] [address]   MRSA Next Gen by PCR, Nasal     Status: None   Collection Time: 03/27/21  3:10 PM   Specimen: Nasal Mucosa; Nasal Swab  Result Value Ref Range Status   MRSA by PCR Next Gen NOT DETECTED NOT DETECTED Final    Comment: (NOTE) The GeneXpert MRSA Assay (FDA approved for NASAL specimens only), is one component of a comprehensive MRSA colonization surveillance program. It is not intended to diagnose MRSA infection nor to guide or monitor treatment for MRSA infections. Test performance is not FDA approved in patients less than 809 years old. Performed at [facility], [address] [place], [place] [address]        Radiology Studies: No results found.  Scheduled Meds:  aspirin  81 mg Oral Daily   [START ON 03/30/2021] atorvastatin  40 mg Oral q AM   Chlorhexidine Gluconate Cloth  6 each Topical Daily   ticagrelor  90 mg Oral BID   Continuous Infusions:  sodium chloride       LOS: 3 days   Time spent: 29 minutes   Hughie Clossavi Kaidyn Hernandes, MD [facility]  03/29/2021, 11:38 AM   How to contact the [facility] Attending or Consulting provider  7A - 7P or covering provider during after hours 7P -7A, for this patient?  Check the care team in [facility] and look for a) attending/consulting [facility] provider listed and b) the [facility] team listed. Page or secure chat 7A-7P. Log into [email or web address] and use [place]'s universal password to access. If you do not have the password, please contact the hospital operator. Locate the [facility] provider you are looking for under [facility] and page to a number that you can be directly reached. If you still have difficulty reaching the provider, please page the [facility] (Director on Call) for the Hospitalists listed on amion for assistance.

## 2021-03-29 NOTE — Progress Notes (Signed)
Physical Therapy Treatment Patient Details Name: Stephen Mullins MRN: [id number] DOB: 01/19/1946 Today's Date: 03/29/2021    History of Present Illness 75 yo aditted with transient R facial numbness, R facial droop and R sided weakness. 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 7/22 left ICA stenosis s/p revascularization with stent assisted angioplasty with distal protection for symptomatic stenosis  PMH significant for hyperlipidemia, diabetes, hypertension, GERD, aortic valve replacement.    PT Comments    The pt was seen for continued assessment of mobility following revascularization and stent placement on 7/22. The pt continues to demo good independence with bed mobility, but requires at least single UE support for OOB transfers and gait. The pt completed 125 ft ambulation with holding IV pole and minG for safety. The pt also continues to have difficulty with visual scanning, running into multiple objects on his right side, and demos decreased stability with any additional challenge such as head turns, stepping over objects on the floor or addition of cognitive task. Will continue to benefit from skilled PT acutely and following d/c to maximize functional safety and stability with mobility.     Follow Up Recommendations  Outpatient PT;Supervision for mobility/OOB (neuro OPPT)     Equipment Recommendations  None recommended by PT    Recommendations for Other Services       Precautions / Restrictions Precautions Precautions: Fall Restrictions Weight Bearing Restrictions: No    Mobility  Bed Mobility Overal bed mobility: Modified Independent                  Transfers Overall transfer level: Needs assistance Equipment used: 1 person hand held assist Transfers: Sit to/from Stand Sit to Stand: Min assist         General transfer comment: pt unsteady initially and in bed without transfer 7/21 per patient (  Thursday)  Ambulation/Gait Ambulation/Gait assistance: Min guard Gait Distance (Feet): 125 Feet Assistive device: IV Pole Gait Pattern/deviations: Step-through pattern;Decreased stride length;Drifts right/left Gait velocity: Decreased Gait velocity interpretation: <1.31 ft/sec, indicative of household ambulator General Gait Details: pt running into multiple objects on the R, cues for visual scanning. increased in stability with head turns, minimal clearance with stepping over objects, mostly minG with cues for safety    Modified Rankin (Stroke Patients Only) Modified Rankin (Stroke Patients Only) Pre-Morbid Rankin Score: No symptoms Modified Rankin: Moderately severe disability     Balance Overall balance assessment: Needs assistance Sitting-balance support: No upper extremity supported;Feet supported Sitting balance-Leahy Scale: Good     Standing balance support: During functional activity;Single extremity supported Standing balance-Leahy Scale: Fair Standing balance comment: minG with single UE support             High level balance activites: Head turns (stepping over objects on floor) High Level Balance Comments: pt with decreased stability and increased need for assist with added challenge. unable to maintain ambulation with addition of dual task (cog task).            Cognition Arousal/Alertness: Awake/alert Behavior During Therapy: WFL for tasks assessed/performed Overall Cognitive Status: Impaired/Different from baseline Area of Impairment: Attention;Memory;Safety/judgement;Problem solving                   Current Attention Level: Selective Memory: Decreased short-term memory   Safety/Judgement: Decreased awareness of safety   Problem Solving: Slow processing;Difficulty sequencing General Comments: pt questioning accuracy of BP and states "how can that be" OT giving description of lay vs stand and pt  states "well thats true" poor recall of  information given at start of session. pt with decreased awareness of safety with mobility, running into multiple objects on R side, demos decreased insight to deficits and need for assist for safety      Exercises      General Comments General comments (skin integrity, edema, etc.): BP with slight decrease from supine to standing, pt asymptomatic      Pertinent Vitals/Pain Pain Assessment: Faces Faces Pain Scale: Hurts a little bit Pain Location: R groin area near the IR procedure Pain Descriptors / Indicators: Discomfort;Grimacing Pain Intervention(s): Limited activity within patient's tolerance;Monitored during session    Home Living Family/patient expects to be discharged to:: Private residence Living Arrangements: Spouse/significant other Available Help at Discharge: Family;Available 24 hours/day Type of Home: House Home Access: Stairs to enter Entrance Stairs-Rails: None Home Layout: One level Home Equipment: Environmental consultant - 2 wheels;Cane - single point;Bedside commode;Shower seat;Grab bars - tub/shower;Hand held shower head Additional Comments: cat named smoke    Prior Function Level of Independence: Independent      Comments: drives   PT Goals (current goals can now be found in the care plan section) Acute Rehab PT Goals Patient Stated Goal: to go home PT Goal Formulation: With patient Time For Goal Achievement: 04/09/21 Potential to Achieve Goals: Good Progress towards PT goals: Progressing toward goals    Frequency    Min 4X/week      PT Plan Current plan remains appropriate       AM-PAC PT "6 Clicks" Mobility   Outcome Measure  Help needed turning from your back to your side while in a flat bed without using bedrails?: A Little Help needed moving from lying on your back to sitting on the side of a flat bed without using bedrails?: A Little Help needed moving to and from a bed to a chair (including a wheelchair)?: A Little Help needed standing up from a  chair using your arms (e.g., wheelchair or bedside chair)?: A Little Help needed to walk in hospital room?: A Little Help needed climbing 3-5 steps with a railing? : A Little 6 Click Score: 18    End of Session Equipment Utilized During Treatment: Gait belt Activity Tolerance: Patient tolerated treatment well Patient left: with call bell/phone within reach;in chair;with chair alarm set Nurse Communication: Mobility status PT Visit Diagnosis: Unsteadiness on feet (R26.81);Other symptoms and signs involving the nervous system (R29.898)     Time: 8850-2774 PT Time Calculation (min) (ACUTE ONLY): 34 min  Charges:  $Gait Training: 8-22 mins $Neuromuscular Re-education: 8-22 mins                     Lazarus Gowda, PT, DPT   Acute Rehabilitation Department Pager #: [phone]   Gaetana Michaelis 03/29/2021, 3:05 PM

## 2021-03-29 NOTE — Progress Notes (Signed)
Occupational Therapy Treatment Patient Details Name: Stephen Mullins MRN: [id number] DOB: 11-19-1945 Today's Date: 03/29/2021    History of present illness 75 yo aditted with transient R facial numbness, R facial droop and R sided weakness. 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 7/22 left ICA stenosis s/p revascularization with stent assisted angioplasty with distal protection for symptomatic stenosis  PMH significant for hyperlipidemia, diabetes, hypertension, GERD, aortic valve replacement.   OT comments  Pt shows increased ability to verbally express needs, awareness to L eye visual changes and recent procedure 7/22. Pt reports wife with history CVA and does not drive very much. Pt reports wife will call one of her two sons to come get patient from hospital. Pt expressed not knowing who could take him to therapy but sure someone other than wife could do it. Recommend continued outpatient.    Follow Up Recommendations  Outpatient OT    Equipment Recommendations  None recommended by OT    Recommendations for Other Services      Precautions / Restrictions Precautions Precautions: Fall       Mobility Bed Mobility Overal bed mobility: Modified Independent                  Transfers Overall transfer level: Needs assistance Equipment used: 1 person hand held assist Transfers: Sit to/from Stand Sit to Stand: Min assist         General transfer comment: pt unsteady initially and in bed without transfer 7/21 per patient ( Thursday)    Balance   Sitting-balance support: No upper extremity supported;Feet supported Sitting balance-Leahy Scale: Good                                     ADL either performed or assessed with clinical judgement   ADL Overall ADL's : Needs assistance/impaired                     Lower Body Dressing: Moderate assistance Lower Body Dressing Details (indicate cue type and reason): pt  don L socks but declined to don R sock due to pain at upper R LE. pt states that site is painful. Toilet Transfer: Min guard           Functional mobility during ADLs: Min guard General ADL Comments: pt reaching for environmental support and states "i am stiff legged" pt decrease awareness to fall risk. pt able to verbalize change in vision with desciption. pt has awareness to deficit in L eye only     Vision Baseline Vision/History: Wears glasses Wears Glasses: At all times Patient Visual Report: Other (comment) Vision Assessment?: Yes Eye Alignment: Within Functional Limits Ocular Range of Motion: Within Functional Limits Alignment/Gaze Preference: Within Defined Limits Tracking/Visual Pursuits: Able to track stimulus in all quads without difficulty Saccades: Additional head turns occurred during testing Visual Fields: Other (comment) (L eye central vision describes as "[place] shaped" missing vision. pt asked why he describes it as [place] and states its the same shape exactly)   Perception     Praxis      Cognition Arousal/Alertness: Awake/alert Behavior During Therapy: WFL for tasks assessed/performed Overall Cognitive Status: Impaired/Different from baseline Area of Impairment: Attention;Memory;Safety/judgement;Problem solving                   Current Attention Level: Selective Memory: Decreased short-term memory   Safety/Judgement: Decreased awareness of  safety   Problem Solving: Slow processing;Difficulty sequencing General Comments: pt questioning accuracy of BP and states "how can that be" OT giving description of lay vs stand and pt states "well thats true" poor recall of information given at start of session        Exercises     Shoulder Instructions       General Comments BP decrease with bed to stand transfer but rebounds quickly without symptoms.    Pertinent Vitals/ Pain       Pain Assessment: Faces Faces Pain Scale: Hurts a little  bit Pain Location: R groin area near the IR procedure Pain Descriptors / Indicators: Discomfort;Grimacing Pain Intervention(s): Monitored during session;Repositioned  Home Living Family/patient expects to be discharged to:: Private residence Living Arrangements: Spouse/significant other Available Help at Discharge: Family;Available 24 hours/day Type of Home: House Home Access: Stairs to enter [employer] of Steps: 3 Entrance Stairs-Rails: None Home Layout: One level     Bathroom Shower/Tub: Producer, television/film/video: Standard Bathroom Accessibility: Yes How Accessible: Accessible via walker Home Equipment: Walker - 2 wheels;Cane - single point;Bedside commode;Shower seat;Grab bars - tub/shower;Hand held shower head   Additional Comments: cat named smoke      Prior Functioning/Environment Level of Independence: Independent        Comments: drives   Frequency  Min 2X/week        Progress Toward Goals  OT Goals(current goals can now be found in the care plan section)  Progress towards OT goals: Progressing toward goals  Acute Rehab OT Goals Patient Stated Goal: to go home OT Goal Formulation: With patient Time For Goal Achievement: 04/09/21 Potential to Achieve Goals: Good ADL Goals Pt Will Perform Upper Body Bathing: with modified independence;sitting Pt Will Perform Lower Body Bathing: with modified independence;sit to/from stand Pt Will Transfer to Toilet: with modified independence;ambulating Pt/caregiver will Perform Home Exercise Program: Right Upper extremity;With written HEP provided  Plan Discharge plan remains appropriate    Co-evaluation                 AM-PAC OT "6 Clicks" Daily Activity     Outcome Measure   Help from another person eating meals?: None Help from another person taking care of personal grooming?: A Little Help from another person toileting, which includes using toliet, bedpan, or urinal?: A Little Help  from another person bathing (including washing, rinsing, drying)?: A Little Help from another person to put on and taking off regular upper body clothing?: A Little Help from another person to put on and taking off regular lower body clothing?: A Little 6 Click Score: 19    End of Session    OT Visit Diagnosis: Unsteadiness on feet (R26.81);Other abnormalities of gait and mobility (R26.89);Muscle weakness (generalized) (M62.81);Low vision, both eyes (H54.2);Other symptoms and signs involving cognitive function   Activity Tolerance Patient tolerated treatment well   Patient Left Other (comment) (with PT Katie)   Nurse Communication Mobility status;Precautions        Time: 1610-9604 OT Time Calculation (min): 17 min  Charges: OT General Charges $OT Visit: 1 Visit OT Treatments $Self Care/Home Management : 8-22 mins   Brynn, OTR/L  Acute Rehabilitation Services Pager: [phone] Office: [phone] .    Mateo Flow 03/29/2021, 2:41 PM

## 2021-03-29 NOTE — Progress Notes (Signed)
Referring Physician(s): Seti, P.   Supervising Physician: Julieanne Cotton  Patient Status:  [facility] - In-pt  Chief Complaint:  Critical left ICA stenosis s/p revascularization with stent assisted angioplasty with distal protection for symptomatic stenosis With Dr. Corliss Skains on 03/28/21.   Subjective:  Patient laying in bed, not in acute distress.  Wife, son, RN at the bedside. States that his speech is much better today.   Allergies: Tetanus toxoid, adsorbed  Medications: Prior to Admission medications   Medication Sig Start Date End Date Taking? Authorizing Provider  acetaminophen (TYLENOL) 500 MG tablet Take 2 tablets (1,000 mg total) by mouth every 6 (six) hours as needed. Patient taking differently: Take 1,000 mg by mouth every 6 (six) hours as needed (PAIN). 12/25/17  Yes Barrett, Erin R, PA-C  amoxicillin (AMOXIL) 500 MG capsule TAKE 4 TABLETS BY MOUTH 1 HOUR BEFORE DENTAL PROCEDURE Patient taking differently: Take 2,000 mg by mouth See admin instructions. TAKE 4 TABLETS (2000 MG) BY MOUTH 1 HOUR BEFORE DENTAL PROCEDURE 11/21/20  Yes Jake Bathe, MD  aspirin EC 81 MG tablet Take 81 mg by mouth in the morning. Swallow whole.   Yes [provider]  atorvastatin (LIPITOR) 10 MG tablet Take 10 mg by mouth in the morning. 05/06/20  Yes [provider]  Efinaconazole 10 % SOLN Apply 1 drop topically daily. Patient taking differently: Apply 1 drop topically daily. APPLIED TO TOE NAILS 02/11/21  Yes Vivi Barrack, DPM  hydrochlorothiazide (HYDRODIURIL) 12.5 MG tablet Take 1 tablet by mouth once daily Patient taking differently: Take 12.5 mg by mouth in the morning. 01/14/21  Yes Jake Bathe, MD  hydrocortisone cream 1 % Apply 1 application topically daily as needed for itching.   Yes [provider]  lisinopril (ZESTRIL) 5 MG tablet Take 5 mg by mouth in the morning.   Yes [provider]  neomycin-bacitracin-polymyxin (NEOSPORIN) ointment  Apply 1 application topically daily as needed for wound care.   Yes [provider]  oxymetazoline (AFRIN) 0.05 % nasal spray Place 1 spray into both nostrils daily as needed for congestion.   Yes [provider]  NONFORMULARY OR COMPOUNDED ITEM [facility]: Antifungal nail lacquer - Fluconazole 2%, Terbinafine 1%, DMSO. Patient not taking: No sig reported 09/22/18   Vivi Barrack, DPM     Vital Signs: BP 125/78   Pulse (!) 56   Temp 98.4 F (36.9 C) (Oral)   Resp 17   Ht  (1.803 m)   Wt 195 lb (88.5 kg)   SpO2 96%   BMI 27.20 kg/m   Physical Exam Vitals reviewed.  Constitutional:      General: He is not in acute distress.    Appearance: Normal appearance. He is not ill-appearing.  HENT:     Head: Normocephalic and atraumatic.  Pulmonary:     Effort: Pulmonary effort is normal.  Abdominal:     General: Abdomen is flat.     Palpations: Abdomen is soft.  Musculoskeletal:     Cervical back: Neck supple.  Skin:    General: Skin is warm and dry.     Coloration: Skin is not pale.     Comments: Positive dressing on right RA puncture site. Sites is unremarkable with no erythema, edema, tenderness, bleeding or drainage. Minimal amount of old, dry blood note son the dressing. Dressing otherwise clean, dry, and intact.   Positive dressing on right CFA puncture site. Site is unremarkable with no erythema, edema,  tenderness, bleeding or drainage. Minimal amount of old, dry blood note son the dressing. Dressing otherwise clean, dry, and intact.  Ecchymosis noted at 11 o'clock and 5 o'clock, approximally 2-3 inches away from the right CFA puncture site.  Mild TTP on ecchymosis at 11 o'clock.     Neurological:     Mental Status: He is alert and oriented to person, place, and time.     Comments: Alert, awake, and oriented x4  Speech and comprehension better than yesterday, mild expressive aphasia  PERRL  EOMs intact, negative nystagmus or subjective  diplopia. No facial asymmetry. Tongue midline  Motor power intact in all 4 extremities  No pronator drift. Fine motor and coordination intact Distal pulses DP palpable bilaterally, right RP palpable     Psychiatric:        Mood and Affect: Mood normal.        Behavior: Behavior normal.        Judgment: Judgment normal.    Imaging: CT HEAD WO CONTRAST  Result Date: 03/25/2021 CLINICAL DATA:  Possible TIA EXAM: CT HEAD WITHOUT CONTRAST TECHNIQUE: Contiguous axial images were obtained from the base of the skull through the vertex without intravenous contrast. COMPARISON:  None. FINDINGS: Brain: No hemorrhage is visualized. Rounded area of hypodensity at the left temporoparietal junction consistent with edema. No midline shift or significant mass effect. Mild atrophy. Nonenlarged ventricles. Vascular: No hyperdense vessels.  Carotid vascular calcification Skull: Normal. Negative for fracture or focal lesion. Sinuses/Orbits: Mucosal thickening in the sinuses Other: None IMPRESSION: 1. Focal hypodensity at the left temporoparietal junction consistent with an area of edema, probably due to small acute or subacute infarct. No definite hemorrhage at this time. 2. Atrophy Electronically Signed   By: Jasmine Pang M.D.   On: 03/25/2021 16:17   MR ANGIO HEAD WO CONTRAST  Result Date: 03/25/2021 CLINICAL DATA:  Neuro deficit, acute, stroke suspected; Stroke, follow up EXAM: MRI HEAD WITHOUT CONTRAST MRA HEAD WITHOUT CONTRAST TECHNIQUE: Multiplanar, multi-echo pulse sequences of the brain and surrounding structures were acquired without intravenous contrast. Angiographic images of the Circle of Willis were acquired using MRA technique without intravenous contrast. COMPARISON:  No pertinent prior exam. FINDINGS: MRI HEAD FINDINGS Brain: Multifocal abnormal diffusion restriction posterior left MCA territory. No acute or chronic hemorrhage. Mild edema in the left parietal lobe at one of the larger infarct sites.  Mild generalized volume loss. The midline structures are normal. Vascular: Major flow voids are preserved. Skull and upper cervical spine: Normal calvarium and skull base. Visualized upper cervical spine and soft tissues are normal. Sinuses/Orbits:No paranasal sinus fluid levels or advanced mucosal thickening. No mastoid or middle ear effusion. Normal orbits. MRA HEAD FINDINGS POSTERIOR CIRCULATION: --Vertebral arteries: Normal --Inferior cerebellar arteries: Normal. --Basilar artery: Normal. --Superior cerebellar arteries: Normal. --Posterior cerebral arteries: Normal. ANTERIOR CIRCULATION: --Intracranial internal carotid arteries: Normal. --Anterior cerebral arteries (ACA): Normal. --Middle cerebral arteries (MCA): Normal. ANATOMIC VARIANTS: None IMPRESSION: 1. 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 No hemorrhage or mass effect. 2. Normal intracranial MRA. Electronically Signed   By: Deatra Robinson M.D.   On: 03/25/2021 21:50   MR BRAIN WO CONTRAST  Result Date: 03/25/2021 CLINICAL DATA:  Neuro deficit, acute, stroke suspected; Stroke, follow up EXAM: MRI HEAD WITHOUT CONTRAST MRA HEAD WITHOUT CONTRAST TECHNIQUE: Multiplanar, multi-echo pulse sequences of the brain and surrounding structures were acquired without intravenous contrast. Angiographic images of the Circle of Willis were acquired using MRA technique without intravenous contrast. COMPARISON:  No  pertinent prior exam. FINDINGS: MRI HEAD FINDINGS Brain: Multifocal abnormal diffusion restriction posterior left MCA territory. No acute or chronic hemorrhage. Mild edema in the left parietal lobe at one of the larger infarct sites. Mild generalized volume loss. The midline structures are normal. Vascular: Major flow voids are preserved. Skull and upper cervical spine: Normal calvarium and skull base. Visualized upper cervical spine and soft tissues are normal. Sinuses/Orbits:No paranasal sinus fluid levels or advanced mucosal  thickening. No mastoid or middle ear effusion. Normal orbits. MRA HEAD FINDINGS POSTERIOR CIRCULATION: --Vertebral arteries: Normal --Inferior cerebellar arteries: Normal. --Basilar artery: Normal. --Superior cerebellar arteries: Normal. --Posterior cerebral arteries: Normal. ANTERIOR CIRCULATION: --Intracranial internal carotid arteries: Normal. --Anterior cerebral arteries (ACA): Normal. --Middle cerebral arteries (MCA): Normal. ANATOMIC VARIANTS: None IMPRESSION: 1. 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 No hemorrhage or mass effect. 2. Normal intracranial MRA. Electronically Signed   By: Deatra Robinson M.D.   On: 03/25/2021 21:50   ECHOCARDIOGRAM COMPLETE  Result Date: 03/26/2021    ECHOCARDIOGRAM REPORT   Patient Name:   Stephen Mullins Date of Exam: 03/26/2021 Medical Rec #:  [id number]      Height:       71.0 in Accession #:    [id number]     Weight:       195.0 lb Date of Birth:  [birth year]     BSA:          2.086 m Patient Age:    74 years       BP:           162/83 mmHg Patient Gender: M              HR:           56 bpm. Exam Location:  Inpatient Procedure: 2D Echo Indications:    stroke  History:        Patient has prior history of Echocardiogram examinations, most                 recent 07/24/2020. CAD; Risk Factors:Dyslipidemia and                 Hypertension.                 Aortic Valve: valve is present in the aortic position.  Sonographer:    Delcie Roch Referring Phys: [id number] TIMOTHY S OPYD IMPRESSIONS  1. Left ventricular ejection fraction, by estimation, is 60 to 65%. The left ventricle has normal function. The left ventricle has no regional wall motion abnormalities. There is mild left ventricular hypertrophy. Left ventricular diastolic parameters are consistent with Grade I diastolic dysfunction (impaired relaxation).  2. Right ventricular systolic function is normal. The right ventricular size is normal.  3. Left atrial size was mildly dilated.  4. The  mitral valve is normal in structure. No evidence of mitral valve regurgitation. No evidence of mitral stenosis.  5. 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 Mean gradient 12 mmHg with no significant regurgitation.  6. Aortic dilatation noted. There is mild dilatation of the ascending aorta, measuring 41 mm.  7. The inferior vena cava is normal in size with greater than 50% respiratory variability, suggesting right atrial pressure of 3 mmHg. FINDINGS  Left Ventricle: Left ventricular ejection fraction, by estimation, is 60 to 65%. The left ventricle has normal function. The left ventricle has no regional wall motion abnormalities. The left ventricular internal cavity size was normal in size. There is  mild left ventricular hypertrophy. Left ventricular diastolic parameters are consistent with Grade I diastolic dysfunction (impaired relaxation). Right Ventricle: The right ventricular size is normal. No increase in right ventricular wall thickness. Right ventricular systolic function is normal. Left Atrium: Left atrial size was mildly dilated. Right Atrium: Right atrial size was normal in size. Pericardium: There is no evidence of pericardial effusion. Mitral Valve: The mitral valve is normal in structure. There is mild calcification of the mitral valve leaflet(s). Mild mitral annular calcification. No evidence of mitral valve regurgitation. No evidence of mitral valve stenosis. Tricuspid Valve: The tricuspid valve is normal in structure. Tricuspid valve regurgitation is not demonstrated. 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 Mean gradient 12 mmHg with no significant regurgitation. The aortic valve has been repaired/replaced. Aortic valve regurgitation is not visualized. Aortic valve mean gradient measures 12.0 mmHg. Aortic valve peak gradient measures 22.5 mmHg. Aortic valve area, by VTI measures 2.67 cm. There is a valve present in the aortic position. Pulmonic  Valve: The pulmonic valve was normal in structure. Pulmonic valve regurgitation is trivial. Aorta: Aortic dilatation noted. There is mild dilatation of the ascending aorta, measuring 41 mm. Venous: The inferior vena cava is normal in size with greater than 50% respiratory variability, suggesting right atrial pressure of 3 mmHg. IAS/Shunts: No atrial level shunt detected by color flow Doppler.  LEFT VENTRICLE PLAX 2D LVIDd:         4.90 cm  Diastology LVIDs:         3.00 cm  LV e' medial:    10.10 cm/s LV PW:         1.20 cm  LV E/e' medial:  9.5 LV IVS:        1.40 cm  LV e' lateral:   11.90 cm/s LVOT diam:     2.10 cm  LV E/e' lateral: 8.1 LV SV:         122 LV SV Index:   59 LVOT Area:     3.46 cm  RIGHT VENTRICLE             IVC RV S prime:     18.60 cm/s  IVC diam: 1.40 cm TAPSE (M-mode): 1.8 cm LEFT ATRIUM             Index       RIGHT ATRIUM           Index LA diam:        4.50 cm 2.16 cm/m  RA Area:     16.50 cm LA Vol (A2C):   77.8 ml 37.30 ml/m RA Volume:   40.70 ml  19.51 ml/m LA Vol (A4C):   64.3 ml 30.82 ml/m LA Biplane Vol: 72.9 ml 34.95 ml/m  AORTIC VALVE AV Area (Vmax):    2.50 cm AV Area (Vmean):   2.62 cm AV Area (VTI):     2.67 cm AV Vmax:           237.00 cm/s AV Vmean:          155.500 cm/s AV VTI:            0.457 m AV Peak Grad:      22.5 mmHg AV Mean Grad:      12.0 mmHg LVOT Vmax:         171.00 cm/s LVOT Vmean:        117.500 cm/s LVOT VTI:          0.353 m LVOT/AV VTI  ratio: 0.77  AORTA Ao Root diam: 4.10 cm Ao Asc diam:  4.10 cm MITRAL VALVE MV Area (PHT): 2.00 cm     SHUNTS MV Decel Time: 380 msec     Systemic VTI:  0.35 m MV E velocity: 96.40 cm/s   Systemic Diam: 2.10 cm MV A velocity: 127.00 cm/s MV E/A ratio:  0.76 Marca Ancona MD Electronically signed by Marca Ancona MD Signature Date/Time: 03/26/2021/3:41:52 PM    Final    VAS US CAROTID (at [facility] and [facility] only)  Result Date: 03/26/2021 Carotid Arterial Duplex Study Patient Name:  Stephen Mullins  Date of Exam:   03/26/2021  Medical Rec #: [id number]       Accession #:    [id number] Date of Birth: 28-Apr-1946      Patient Gender: M Patient Age:   62Y Exam Location:  [facility] Procedure:      VAS US CAROTID Referring Phys: [id number] TIMOTHY S OPYD --------------------------------------------------------------------------------  Indications:      CVA and Speech disturbance. Risk Factors:     Hypertension, hyperlipidemia, Diabetes, past history of                   smoking. Comparison Study: 12/16/2017- pre-CABG Dopplers: bilateral 1-39% ICA stenosis. Performing Technologist: Gertie Fey MHA, RDMS, RVT, RDCS  Examination Guidelines: A complete evaluation includes B-mode imaging, spectral Doppler, color Doppler, and power Doppler as needed of all accessible portions of each vessel. Bilateral testing is considered an integral part of a complete examination. Limited examinations for reoccurring indications may be performed as noted.  Right Carotid Findings: +----------+-------+--------+--------+-----------------------+-----------------+           PSV    EDV cm/sStenosisPlaque Description     Comments                    cm/s                                                            +----------+-------+--------+--------+-----------------------+-----------------+ CCA Prox  80     16                                                       +----------+-------+--------+--------+-----------------------+-----------------+ CCA Distal73     15                                                       +----------+-------+--------+--------+-----------------------+-----------------+ ICA Prox  50     9                                      intimal  thickening        +----------+-------+--------+--------+-----------------------+-----------------+ ICA Distal74     20              smooth and heterogenous                   +----------+-------+--------+--------+-----------------------+-----------------+ ECA       195    23              heterogenous and                                                          calcific                                 +----------+-------+--------+--------+-----------------------+-----------------+ +----------+--------+-------+----------------+-------------------+           PSV cm/sEDV cmsDescribe        Arm Pressure (mmHG) +----------+--------+-------+----------------+-------------------+ Subclavian160            Multiphasic, WNL                    +----------+--------+-------+----------------+-------------------+ +---------+--------+--+--------+--+---------+ VertebralPSV cm/s57EDV cm/s11Antegrade +---------+--------+--+--------+--+---------+  Left Carotid Findings: +----------+--------+--------+--------+------------------+--------+           PSV cm/sEDV cm/sStenosisPlaque DescriptionComments +----------+--------+--------+--------+------------------+--------+ CCA Prox  80      11                                         +----------+--------+--------+--------+------------------+--------+ CCA Distal70      16                                         +----------+--------+--------+--------+------------------+--------+ ICA Prox  386     125     80-99%                             +----------+--------+--------+--------+------------------+--------+ ICA Mid   255     40                                         +----------+--------+--------+--------+------------------+--------+ ICA Distal163     26                                         +----------+--------+--------+--------+------------------+--------+ ECA       229     27                                         +----------+--------+--------+--------+------------------+--------+ +----------+--------+--------+----------------+-------------------+           PSV cm/sEDV cm/sDescribe         Arm Pressure (mmHG) +----------+--------+--------+----------------+-------------------+ [id number]             Multiphasic, WNL                    +----------+--------+--------+----------------+-------------------+ +---------+--------+--+--------+--+---------+  VertebralPSV cm/s58EDV cm/s13Antegrade +---------+--------+--+--------+--+---------+   Summary: Right Carotid: Velocities in the right ICA are consistent with a 1-39% stenosis. Left Carotid: Velocities in the left ICA are consistent with a 80-99% stenosis.               Significant progression of disease when compared to previous study               12/16/2017. Vertebrals:  Bilateral vertebral arteries demonstrate antegrade flow. Subclavians: Normal flow hemodynamics were seen in bilateral subclavian              arteries. *See table(s) above for measurements and observations.  Electronically signed by Delia Heady MD on 03/26/2021 at 2:00:49 PM.    Final    IR ANGIO VERTEBRAL SEL SUBCLAVIAN INNOMINATE BILAT MOD SED  Result Date: 03/28/2021 CLINICAL DATA:  New onset of expressive aphasia. Severe narrowing of the left internal carotid artery proximally on ultrasound of the head and neck. EXAM: IR ANGIO EXTERNAL CAROTID SEL EXT CAROTID BILAT MOD SED COMPARISON:  Recent MRI of the brain and MRA of the brain. MEDICATIONS: Heparin 1000 units IV; no antibiotic was administered within 1 hour of the procedure. ANESTHESIA/SEDATION: Versed 1 mg IV; Fentanyl 25 mcg IV Moderate Sedation Time:  36 minutes The patient was continuously monitored during the procedure by the interventional radiology nurse under my direct supervision. CONTRAST:  Omnipaque 300 65 mL. FLUOROSCOPY TIME:  Fluoroscopy Time: 14 minutes 18 seconds (984.6 mGy). COMPLICATIONS: None immediate. TECHNIQUE: Informed written consent was obtained from the patient after a thorough discussion of the procedural risks, benefits and alternatives. All questions were addressed. Maximal  Sterile Barrier Technique was utilized including caps, mask, sterile gowns, sterile gloves, sterile drape, hand hygiene and skin antiseptic. A timeout was performed prior to the initiation of the procedure. The right groin was prepped and draped in the usual sterile fashion. Thereafter using modified Seldinger technique, transfemoral access into the right common femoral artery was obtained without difficulty. Over a 0.035 inch guidewire, a 5 French Pinnacle sheath was inserted. Through this, and also over 0.035 inch guidewire, a 5 [place] JB 1 catheter was advanced to the aortic arch region and selectively positioned in the right common carotid artery, the right vertebral artery, the left common carotid artery and the left vertebral artery. FINDINGS:.: FINDINGS:. The right vertebral artery origin is widely patent. The vessel is seen to opacify to the cranial skull base. Wide patency is seen of the left vertebrobasilar junction and the left posterior-inferior cerebellar artery. The basilar artery, the posterior cerebral arteries, the superior cerebellar arteries and the anterior-inferior cerebellar arteries opacify into the capillary and venous phases. Unopacified blood is seen in the basilar artery from the contralateral vertebral artery. The left common carotid arteriogram demonstrates the left external carotid artery to be mildly narrowed at its origin. Its branches are widely patent. The left internal carotid artery just distal to the bulb has a severe pre occlusive stenosis secondary to a circumferential plaque. More distally ascent of contrast is seen to the cranial skull base in the left internal carotid artery. The petrous, the cavernous and the supraclinoid segments are widely patent. The left middle cerebral artery is seen to opacify into the capillary venous phases with unopacified blood seen in the left middle cerebral artery from the contralateral right ICA via the anterior communicating artery. No  antegrade opacification of the left anterior cerebral artery is seen. The right common carotid arteriogram demonstrates approximately 50-60% stenosis at the origin of  the right external carotid artery. The right internal carotid artery at the bulb to the cranial skull base is widely patent. The petrous, the cavernous and the supraclinoid segments are patent with mild arteriosclerotic changes seen in the distal cavernous segment. The supraclinoid right ICA is widely patent. The right middle cerebral artery and the right anterior cerebral artery opacify into the capillary and venous phases. Prompt cross-filling via the anterior communicating artery of the left anterior cerebral artery A2 segment and distally, the left A1 segment and the left middle cerebral artery transiently is seen. The right vertebral artery origin is widely patent. The vessel is seen to opacify to the cranial skull base. Patency is seen of the right vertebrobasilar junction and the right posterior-inferior cerebellar artery. The basilar artery, the posterior cerebral arteries, the superior cerebellar arteries and the anterior-inferior cerebellar arteries opacify into the capillary and venous phases. Transient opacification of the left vertebrobasilar junction is seen from the right vertebral artery injection. 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 PLAN: Findings reviewed with the patient and the spouse, and referring neurologist. It was decided to proceed with endovascular revascularization of the severely stenotic symptomatic left internal carotid artery as soon as possible. Electronically Signed   By: Julieanne Cotton M.D.   On: 03/27/2021 09:04   IR ANGIO EXTERNAL CAROTID SEL EXT CAROTID BILAT MOD SED  Result Date: 03/28/2021 CLINICAL DATA:  New onset of expressive aphasia. Severe narrowing of the left internal carotid  artery proximally on ultrasound of the head and neck. EXAM: IR ANGIO EXTERNAL CAROTID SEL EXT CAROTID BILAT MOD SED COMPARISON:  Recent MRI of the brain and MRA of the brain. MEDICATIONS: Heparin 1000 units IV; no antibiotic was administered within 1 hour of the procedure. ANESTHESIA/SEDATION: Versed 1 mg IV; Fentanyl 25 mcg IV Moderate Sedation Time:  36 minutes The patient was continuously monitored during the procedure by the interventional radiology nurse under my direct supervision. CONTRAST:  Omnipaque 300 65 mL. FLUOROSCOPY TIME:  Fluoroscopy Time: 14 minutes 18 seconds (984.6 mGy). COMPLICATIONS: None immediate. TECHNIQUE: Informed written consent was obtained from the patient after a thorough discussion of the procedural risks, benefits and alternatives. All questions were addressed. Maximal Sterile Barrier Technique was utilized including caps, mask, sterile gowns, sterile gloves, sterile drape, hand hygiene and skin antiseptic. A timeout was performed prior to the initiation of the procedure. The right groin was prepped and draped in the usual sterile fashion. Thereafter using modified Seldinger technique, transfemoral access into the right common femoral artery was obtained without difficulty. Over a 0.035 inch guidewire, a 5 French Pinnacle sheath was inserted. Through this, and also over 0.035 inch guidewire, a 5 [place] JB 1 catheter was advanced to the aortic arch region and selectively positioned in the right common carotid artery, the right vertebral artery, the left common carotid artery and the left vertebral artery. FINDINGS:.: FINDINGS:. The right vertebral artery origin is widely patent. The vessel is seen to opacify to the cranial skull base. Wide patency is seen of the left vertebrobasilar junction and the left posterior-inferior cerebellar artery. The basilar artery, the posterior cerebral arteries, the superior cerebellar arteries and the anterior-inferior cerebellar arteries opacify into  the capillary and venous phases. Unopacified blood is seen in the basilar artery from the contralateral vertebral artery. The left common carotid arteriogram demonstrates the left external carotid artery to be mildly narrowed at its origin. Its branches  are widely patent. The left internal carotid artery just distal to the bulb has a severe pre occlusive stenosis secondary to a circumferential plaque. More distally ascent of contrast is seen to the cranial skull base in the left internal carotid artery. The petrous, the cavernous and the supraclinoid segments are widely patent. The left middle cerebral artery is seen to opacify into the capillary venous phases with unopacified blood seen in the left middle cerebral artery from the contralateral right ICA via the anterior communicating artery. No antegrade opacification of the left anterior cerebral artery is seen. The right common carotid arteriogram demonstrates approximately 50-60% stenosis at the origin of the right external carotid artery. The right internal carotid artery at the bulb to the cranial skull base is widely patent. The petrous, the cavernous and the supraclinoid segments are patent with mild arteriosclerotic changes seen in the distal cavernous segment. The supraclinoid right ICA is widely patent. The right middle cerebral artery and the right anterior cerebral artery opacify into the capillary and venous phases. Prompt cross-filling via the anterior communicating artery of the left anterior cerebral artery A2 segment and distally, the left A1 segment and the left middle cerebral artery transiently is seen. The right vertebral artery origin is widely patent. The vessel is seen to opacify to the cranial skull base. Patency is seen of the right vertebrobasilar junction and the right posterior-inferior cerebellar artery. The basilar artery, the posterior cerebral arteries, the superior cerebellar arteries and the anterior-inferior cerebellar arteries  opacify into the capillary and venous phases. Transient opacification of the left vertebrobasilar junction is seen from the right vertebral artery injection. 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 PLAN: Findings reviewed with the patient and the spouse, and referring neurologist. It was decided to proceed with endovascular revascularization of the severely stenotic symptomatic left internal carotid artery as soon as possible. Electronically Signed   By: Julieanne Cotton M.D.   On: 03/27/2021 09:04    Labs:  CBC: Recent Labs    03/24/21 1037 03/25/21 1536 03/25/21 1632 03/26/21 0345 03/29/21 0645  WBC 6.9 8.3  --  7.1 11.4*  HGB 15.4 15.4 15.6 14.6 13.0  HCT 45.9 46.5 46.0 43.4 38.7*  PLT 239 250  --  215 198    COAGS: Recent Labs    03/25/21 1536 03/28/21 1121  INR 1.1  --   APTT 32 95*    BMP: Recent Labs    03/25/21 1536 03/25/21 1632 03/26/21 0345 03/27/21 0500 03/29/21 0645  NA 139 141 137 136 139  K 4.2 4.1 3.8 4.0 3.6  CL 103 105 105 108 112*  CO2 28  --  27 20* 22  GLUCOSE 113* 108* 141* 134* 126*  BUN 16 19 14 12 14   CALCIUM 10.0  --  9.5 9.2 8.9  CREATININE 1.09 1.00 0.95 0.90 0.80  GFRNONAA >60  --  >60 >60 >60    LIVER FUNCTION TESTS: Recent Labs    09/03/20 1051 10/16/20 1046 12/05/20 1107 03/25/21 1536  BILITOT 0.5 0.6 0.5 0.6  AST 19 25 15 23   ALT 23 26 17 24   ALKPHOS 60 56  --  43  PROT 7.1 6.9 7.2 6.8  ALBUMIN 4.6 4.3  --  4.0    Assessment and Plan:  75 year old male with critical left ICA stenosis, s/p revascularization with stent assisted angioplasty with distal protection for symptomatic stenosis via right radial  and right CFA approach with Dr. Corliss Skains on 03/28/21.   Patient stable, not in acute distress. Persistent mild expressive aphasia, improving. Neuro exam unremarkable otherwise. Bilateral DP and right RP  palpable. VS bradycardia but at baseline, BP well controlled, has been off Cleviprex since 1 AM today. Patient still has a Foley catheter that was placed by urologist -there was no difficulty in placement remove at your leisure per urology. Defer timing of the foley catheter removal to neurology.   Plan: - 2-week follow-up visit after discharge with Dr. Corliss Skains at [facility] radiology.  - Continue dual antiplatelet therapy with ASA 81 mg daily and Brilinta 90 mg twice daily   Further treatment plan per Neurology  Appreciate and agree with the plan.  Please call NIR for questions and concerns.   Electronically Signed: Willette Brace, PA-C 03/29/2021, 9:01 AM   I spent a total of 35 Minutes at the the patient's bedside AND on the patient's hospital floor or unit, greater than 50% of which was counseling/coordinating care for left ICA stenosis revascularization.

## 2021-03-30 DIAGNOSIS — I63232 Cerebral infarction due to unspecified occlusion or stenosis of left carotid arteries: Secondary | ICD-10-CM | POA: Diagnosis not present

## 2021-03-30 DIAGNOSIS — E7800 Pure hypercholesterolemia, unspecified: Secondary | ICD-10-CM | POA: Diagnosis not present

## 2021-03-30 DIAGNOSIS — I639 Cerebral infarction, unspecified: Secondary | ICD-10-CM | POA: Diagnosis not present

## 2021-03-30 DIAGNOSIS — I10 Essential (primary) hypertension: Secondary | ICD-10-CM | POA: Diagnosis not present

## 2021-03-30 LAB — CBC WITH DIFFERENTIAL/PLATELET
Abs Immature Granulocytes: 0.03 10*3/uL (ref 0.00–0.07)
Basophils Absolute: 0.1 10*3/uL (ref 0.0–0.1)
Basophils Relative: 1 %
Eosinophils Absolute: 0.3 10*3/uL (ref 0.0–0.5)
Eosinophils Relative: 3 %
HCT: 35.6 % — ABNORMAL LOW (ref 39.0–52.0)
Hemoglobin: 12.2 g/dL — ABNORMAL LOW (ref 13.0–17.0)
Immature Granulocytes: 0 %
Lymphocytes Relative: 16 %
Lymphs Abs: 1.2 10*3/uL (ref 0.7–4.0)
MCH: 30.5 pg (ref 26.0–34.0)
MCHC: 34.3 g/dL (ref 30.0–36.0)
MCV: 89 fL (ref 80.0–100.0)
Monocytes Absolute: 0.7 10*3/uL (ref 0.1–1.0)
Monocytes Relative: 9 %
Neutro Abs: 5.4 10*3/uL (ref 1.7–7.7)
Neutrophils Relative %: 71 %
Platelets: 195 10*3/uL (ref 150–400)
RBC: 4 MIL/uL — ABNORMAL LOW (ref 4.22–5.81)
RDW: 12.8 % (ref 11.5–15.5)
WBC: 7.6 10*3/uL (ref 4.0–10.5)
nRBC: 0 % (ref 0.0–0.2)

## 2021-03-30 LAB — BASIC METABOLIC PANEL
Anion gap: 6 (ref 5–15)
BUN: 11 mg/dL (ref 8–23)
CO2: 25 mmol/L (ref 22–32)
Calcium: 9.1 mg/dL (ref 8.9–10.3)
Chloride: 108 mmol/L (ref 98–111)
Creatinine, Ser: 0.96 mg/dL (ref 0.61–1.24)
GFR, Estimated: >60 mL/min (ref >60)
Glucose, Bld: 134 mg/dL — ABNORMAL HIGH (ref 70–99)
Potassium: 3.8 mmol/L (ref 3.5–5.1)
Sodium: 139 mmol/L (ref 135–145)

## 2021-03-30 MED ORDER — TICAGRELOR 90 MG PO TABS: 90.0000 mg | ORAL_TABLET | Freq: Two times a day (BID) | ORAL | 0 refills | Status: AC

## 2021-03-30 MED ORDER — ATORVASTATIN CALCIUM 40 MG PO TABS: 40.0000 mg | ORAL_TABLET | Freq: Every morning | ORAL | 0 refills | Status: AC

## 2021-03-30 NOTE — Progress Notes (Signed)
Stephen Mullins is a 75 y.o. male s/p  prox LICA revascularization with stent assisted angioplasty with distal protection for symptomatic stenosis with Dr. Corliss Skains on 03/28/21.   Patient's speech continues to get better, appeared that PT session well yesterday.  Ecchymosis around the right groin and right radial puncture site improving, patient denies TTP.  Patient safe to be discharged from [facility] stand point, will defer the ultimate decision to Stroke team.   - Continue ASA 81 mg every day  - Continue Brilinta 90 mg twice a day  - Drink plenty of water, until urine looks light yellow to clear  - No bending, stooping, lifting more than 10 pounds for  2 weeks - Avoid driving for 2-3 weeks - No further dressing change needed for right wrist and right groin puncture site, keep the sites dry and clean until fully heals. Check for signs of arterial bleeding such as rapidly enlarging bruise, pain, and hardening under the puncture site. - Do not submerge (bathing or swimming) for next 7 days.  - 2 week f/u with Dr. Corliss Skains at [facility], our schedule will call you the sent up the appointment.   It was pleasure meeting Mr. Winslow and his family.    Lynann Bologna Carmaleta Youngers PA-C 03/30/2021 9:26 AM

## 2021-03-30 NOTE — Discharge Summary (Signed)
Physician Discharge Summary  Stephen LENKER WUJ:[id number] DOB: 12-06-1945 DOA: 03/25/2021  PCP: Soundra Pilon, FNP  Admit date: 03/25/2021 Discharge date: 03/30/2021 30 Day Unplanned Readmission Risk Score    Flowsheet Row ED to Hosp-Admission (Current) from 03/25/2021 in [facility] NEURO/TRAUMA/SURGICAL ICU  30 Day Unplanned Readmission Risk Score (%) 7.56 Filed at 03/30/2021 0801       This score is the patient's risk of an unplanned readmission within 30 days of being discharged (0 -100%). The score is based on dignosis, age, lab data, medications, orders, and past utilization.   Low:  0-14.9   Medium: 15-21.9   High: 22-29.9   Extreme: 30 and above          Admitted From: Home Disposition: Home  Recommendations for Outpatient Follow-up:  Follow up with PCP in 1-2 weeks Follow-up with IR in 2 weeks Follow-up with neurology in 4 weeks Please obtain BMP/CBC in one week Please follow up with your PCP on the following pending results: Unresulted Labs (From admission, onward)    None         Home Health: None Equipment/Devices: None  Discharge Condition: Stable CODE STATUS: Full code Diet recommendation: Cardiac  Subjective: Seen and examined.  Feels better.  Expressive dysarthria improving but still somewhat there.  Ready to go home.  Brief/Interim Summary: Stephen Mullins is a 75 y.o. 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  Symptoms completely resolved within approximately 10 to 15 minutes and then shortly after return for approximately 1 minute.  He was completely normal by the time he arrived to the ED.  Did not have  any associated complaints.  He was hemodynamically stable and EKG features sinus rhythm with PVC and ST depressions.  Head CT with focal hypodensity at the left temporoparietal junction consistent with area of edema most likely related to acute or subacute infarction.  Neurology was consulted by the ED physician and admitted to hospitalist service. MRI confirmed multifocal acute infarct within the posterior left MCA territory. Also complains of left visual deficit.  Carotid ultrasound shows minimal right ICA stenosis but 80 to 89% left ICA stenosis. Neurology consulted interventional radiology and patient underwent revascularization of the left ICA stenosis with a stent placement by Dr. Corliss Skains on 03/28/2021.  DAPT changed to aspirin plus Brilinta.  Hemoglobin A1c 6.4.  Atorvastatin increased to 40 mg p.o. daily.  Antihypertensives were resumed and blood pressure at goal now.  Patient continues to improve with his dysarthria since the stent was placed.  Seen and cleared by neurology.  Seen by PT OT and they recommended outpatient PT OT which has been ordered.  Patient is going to be discharged in stable condition.  Of note, patient also had acute urinary retention postprocedure, Foley catheter was placed by urology, this was removed yesterday, patient has voided several times successfully.  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(Jessica McCue, if not available, consider Manson AllanSethi, Penumali, or [facility]  4 weeks. Thanks.   Ambulatory referral to Occupational Therapy   Complete by: As directed    Ambulatory referral to Physical Therapy   Complete by: As directed    Ambulatory referral to Speech Therapy   Complete by: As directed       Allergies as of 03/30/2021       Reactions   Tetanus Toxoid, Adsorbed Other (See Comments)   Pt. States as small child he had tetnus made from horse serum and he turned 'blue' ?? SHORTNESS OF BREATH ??        Medication List     STOP taking these medications    NONFORMULARY OR COMPOUNDED ITEM       TAKE these medications    acetaminophen 500 MG tablet Commonly known as: TYLENOL Take 2 tablets (1,000 mg total) by mouth every 6 (six) hours as needed. What changed: reasons to take this   amoxicillin 500 MG capsule Commonly known as: AMOXIL TAKE 4 TABLETS BY MOUTH 1 HOUR BEFORE DENTAL PROCEDURE What changed:  how much to take how to take this when to take this additional instructions   aspirin EC 81 MG tablet Take 81 mg by mouth in the morning. Swallow whole.   atorvastatin 40 MG tablet Commonly known as: LIPITOR Take 1 tablet (40 mg total) by mouth in the morning. Start taking on: March 31, 2021 What changed:  medication strength how much to take   Efinaconazole 10 % Soln Apply 1 drop topically daily. What changed: additional instructions   hydrochlorothiazide 12.5 MG tablet Commonly known as: HYDRODIURIL Take 1 tablet by mouth once daily What changed: when to take this   hydrocortisone cream 1 % Apply 1 application topically daily as needed for itching.   lisinopril 5 MG tablet Commonly known as: ZESTRIL Take 5 mg by mouth in the morning.   neomycin-bacitracin-polymyxin ointment Commonly known as: NEOSPORIN Apply 1 application topically daily as needed for wound care.   oxymetazoline 0.05 % nasal spray Commonly known as: AFRIN Place 1 spray into both nostrils daily as needed for congestion.   ticagrelor 90 MG  Tabs tablet Commonly known as: BRILINTA Take 1 tablet (90 mg total) by mouth 2 (two) times daily.        Follow-up Information     Julieanne Cottoneveshwar, Sanjeev, MD Follow up in 2 week(s).   Specialties: Interventional Radiology, Radiology Contact information: [address] [place] [address] [phone]         Micki RileySethi, Pramod S, MD Follow up in 4 week(s).   Specialties: Neurology, Radiology Contact information: [address] [place] [address] [phone]                Allergies  Allergen Reactions   Tetanus Toxoid, Adsorbed Other (See Comments)    Pt. States as small child he had tetnus made from horse serum and he turned 'blue' ?? SHORTNESS OF BREATH ??    Consultations: IR, neurology, urology   Procedures/Studies: CT HEAD WO CONTRAST  Result Date: 03/25/2021 CLINICAL DATA:  Possible TIA EXAM: CT HEAD WITHOUT CONTRAST TECHNIQUE: Contiguous axial images were obtained from the base of the skull through the vertex without intravenous  contrast. COMPARISON:  None. FINDINGS: Brain: No hemorrhage is visualized. Rounded area of hypodensity at the left temporoparietal junction consistent with edema. No midline shift or significant mass effect. Mild atrophy. Nonenlarged ventricles. Vascular: No hyperdense vessels.  Carotid vascular calcification Skull: Normal. Negative for fracture or focal lesion. Sinuses/Orbits: Mucosal thickening in the sinuses Other: None IMPRESSION: 1. Focal hypodensity at the left temporoparietal junction consistent with an area of edema, probably due to small acute or subacute infarct. No definite hemorrhage at this time. 2. Atrophy Electronically Signed   By: Jasmine Pang M.D.   On: 03/25/2021 16:17   MR ANGIO HEAD WO CONTRAST  Result Date: 03/25/2021 CLINICAL DATA:  Neuro deficit, acute, stroke suspected; Stroke, follow up EXAM: MRI HEAD WITHOUT CONTRAST MRA HEAD WITHOUT CONTRAST TECHNIQUE: Multiplanar, multi-echo pulse sequences of the  brain and surrounding structures were acquired without intravenous contrast. Angiographic images of the Circle of Willis were acquired using MRA technique without intravenous contrast. COMPARISON:  No pertinent prior exam. FINDINGS: MRI HEAD FINDINGS Brain: Multifocal abnormal diffusion restriction posterior left MCA territory. No acute or chronic hemorrhage. Mild edema in the left parietal lobe at one of the larger infarct sites. Mild generalized volume loss. The midline structures are normal. Vascular: Major flow voids are preserved. Skull and upper cervical spine: Normal calvarium and skull base. Visualized upper cervical spine and soft tissues are normal. Sinuses/Orbits:No paranasal sinus fluid levels or advanced mucosal thickening. No mastoid or middle ear effusion. Normal orbits. MRA HEAD FINDINGS POSTERIOR CIRCULATION: --Vertebral arteries: Normal --Inferior cerebellar arteries: Normal. --Basilar artery: Normal. --Superior cerebellar arteries: Normal. --Posterior cerebral arteries: Normal. ANTERIOR CIRCULATION: --Intracranial internal carotid arteries: Normal. --Anterior cerebral arteries (ACA): Normal. --Middle cerebral arteries (MCA): Normal. ANATOMIC VARIANTS: None IMPRESSION: 1. 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 No hemorrhage or mass effect. 2. Normal intracranial MRA. Electronically Signed   By: Deatra Robinson M.D.   On: 03/25/2021 21:50   MR BRAIN WO CONTRAST  Result Date: 03/25/2021 CLINICAL DATA:  Neuro deficit, acute, stroke suspected; Stroke, follow up EXAM: MRI HEAD WITHOUT CONTRAST MRA HEAD WITHOUT CONTRAST TECHNIQUE: Multiplanar, multi-echo pulse sequences of the brain and surrounding structures were acquired without intravenous contrast. Angiographic images of the Circle of Willis were acquired using MRA technique without intravenous contrast. COMPARISON:  No pertinent prior exam. FINDINGS: MRI HEAD FINDINGS Brain: Multifocal abnormal diffusion restriction  posterior left MCA territory. No acute or chronic hemorrhage. Mild edema in the left parietal lobe at one of the larger infarct sites. Mild generalized volume loss. The midline structures are normal. Vascular: Major flow voids are preserved. Skull and upper cervical spine: Normal calvarium and skull base. Visualized upper cervical spine and soft tissues are normal. Sinuses/Orbits:No paranasal sinus fluid levels or advanced mucosal thickening. No mastoid or middle ear effusion. Normal orbits. MRA HEAD FINDINGS POSTERIOR CIRCULATION: --Vertebral arteries: Normal --Inferior cerebellar arteries: Normal. --Basilar artery: Normal. --Superior cerebellar arteries: Normal. --Posterior cerebral arteries: Normal. ANTERIOR CIRCULATION: --Intracranial internal carotid arteries: Normal. --Anterior cerebral arteries (ACA): Normal. --Middle cerebral arteries (MCA): Normal. ANATOMIC VARIANTS: None IMPRESSION: 1. 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 No hemorrhage or mass effect. 2. Normal intracranial MRA. Electronically Signed   By: Deatra Robinson M.D.   On: 03/25/2021 21:50   ECHOCARDIOGRAM COMPLETE  Result Date: 03/26/2021    ECHOCARDIOGRAM REPORT   Patient Name:   Jamez NADER BOYS Date of Exam: 03/26/2021 Medical Rec #:  [id number]      Height:  71.0 in Accession #:    [id number]     Weight:       195.0 lb Date of Birth:  Aug 15, 1946     BSA:          2.086 m Patient Age:    74 years       BP:           162/83 mmHg Patient Gender: M              HR:           56 bpm. Exam Location:  Inpatient Procedure: 2D Echo Indications:    stroke  History:        Patient has prior history of Echocardiogram examinations, most                 recent 07/24/2020. CAD; Risk Factors:Dyslipidemia and                 Hypertension.                 Aortic Valve: valve is present in the aortic position.  Sonographer:    Delcie Roch Referring Phys: [id number] TIMOTHY S OPYD IMPRESSIONS  1. Left ventricular ejection  fraction, by estimation, is 60 to 65%. The left ventricle has normal function. The left ventricle has no regional wall motion abnormalities. There is mild left ventricular hypertrophy. Left ventricular diastolic parameters are consistent with Grade I diastolic dysfunction (impaired relaxation).  2. Right ventricular systolic function is normal. The right ventricular size is normal.  3. Left atrial size was mildly dilated.  4. The mitral valve is normal in structure. No evidence of mitral valve regurgitation. No evidence of mitral stenosis.  5. 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 Mean gradient 12 mmHg with no significant regurgitation.  6. Aortic dilatation noted. There is mild dilatation of the ascending aorta, measuring 41 mm.  7. The inferior vena cava is normal in size with greater than 50% respiratory variability, suggesting right atrial pressure of 3 mmHg. FINDINGS  Left Ventricle: Left ventricular ejection fraction, by estimation, is 60 to 65%. The left ventricle has normal function. The left ventricle has no regional wall motion abnormalities. The left ventricular internal cavity size was normal in size. There is  mild left ventricular hypertrophy. Left ventricular diastolic parameters are consistent with Grade I diastolic dysfunction (impaired relaxation). Right Ventricle: The right ventricular size is normal. No increase in right ventricular wall thickness. Right ventricular systolic function is normal. Left Atrium: Left atrial size was mildly dilated. Right Atrium: Right atrial size was normal in size. Pericardium: There is no evidence of pericardial effusion. Mitral Valve: The mitral valve is normal in structure. There is mild calcification of the mitral valve leaflet(s). Mild mitral annular calcification. No evidence of mitral valve regurgitation. No evidence of mitral valve stenosis. Tricuspid Valve: The tricuspid valve is normal in structure. Tricuspid valve regurgitation is not  demonstrated. 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 Mean gradient 12 mmHg with no significant regurgitation. The aortic valve has been repaired/replaced. Aortic valve regurgitation is not visualized. Aortic valve mean gradient measures 12.0 mmHg. Aortic valve peak gradient measures 22.5 mmHg. Aortic valve area, by VTI measures 2.67 cm. There is a valve present in the aortic position. Pulmonic Valve: The pulmonic valve was normal in structure. Pulmonic valve regurgitation is trivial. Aorta: Aortic dilatation noted. There is mild dilatation of the ascending aorta, measuring 41 mm. Venous: The inferior  vena cava is normal in size with greater than 50% respiratory variability, suggesting right atrial pressure of 3 mmHg. IAS/Shunts: No atrial level shunt detected by color flow Doppler.  LEFT VENTRICLE PLAX 2D LVIDd:         4.90 cm  Diastology LVIDs:         3.00 cm  LV e' medial:    10.10 cm/s LV PW:         1.20 cm  LV E/e' medial:  9.5 LV IVS:        1.40 cm  LV e' lateral:   11.90 cm/s LVOT diam:     2.10 cm  LV E/e' lateral: 8.1 LV SV:         122 LV SV Index:   59 LVOT Area:     3.46 cm  RIGHT VENTRICLE             IVC RV S prime:     18.60 cm/s  IVC diam: 1.40 cm TAPSE (M-mode): 1.8 cm LEFT ATRIUM             Index       RIGHT ATRIUM           Index LA diam:        4.50 cm 2.16 cm/m  RA Area:     16.50 cm LA Vol (A2C):   77.8 ml 37.30 ml/m RA Volume:   40.70 ml  19.51 ml/m LA Vol (A4C):   64.3 ml 30.82 ml/m LA Biplane Vol: 72.9 ml 34.95 ml/m  AORTIC VALVE AV Area (Vmax):    2.50 cm AV Area (Vmean):   2.62 cm AV Area (VTI):     2.67 cm AV Vmax:           237.00 cm/s AV Vmean:          155.500 cm/s AV VTI:            0.457 m AV Peak Grad:      22.5 mmHg AV Mean Grad:      12.0 mmHg LVOT Vmax:         171.00 cm/s LVOT Vmean:        117.500 cm/s LVOT VTI:          0.353 m LVOT/AV VTI ratio: 0.77  AORTA Ao Root diam: 4.10 cm Ao Asc diam:  4.10 cm MITRAL VALVE MV Area  (PHT): 2.00 cm     SHUNTS MV Decel Time: 380 msec     Systemic VTI:  0.35 m MV E velocity: 96.40 cm/s   Systemic Diam: 2.10 cm MV A velocity: 127.00 cm/s MV E/A ratio:  0.76 Marca Ancona MD Electronically signed by Marca Ancona MD Signature Date/Time: 03/26/2021/3:41:52 PM    Final    EXERCISE TOLERANCE TEST (ETT)  Result Date: 03/18/2021  Blood pressure demonstrated a normal response to exercise.  ST segment depression of 5 mm was noted during stress in the II, III, aVF, V5 and V6 leads.  ETT with fair exercise tolerance (6:42); no chest pain but patient did complain of dyspnea; normal blood pressure response; 3 beats nonsustained ventricular tachycardia and frequent PVCs with exercise and 5 beats nonsustained ventricular tachycardia in recovery; 4 to 5 mm of ST depression in the inferior lateral leads and 3 mm ST elevation in AvR; abnormal ETT. Results discussed with Dr Anne Fu.   VAS US CAROTID (at [facility] and [facility] only)  Result Date: 03/26/2021 Carotid Arterial Duplex Study Patient Name:  Darrik E  Tondreau  Date of Exam:   03/26/2021 Medical Rec #: [id number]       Accession #:    [id number] Date of Birth: 23-Feb-1946      Patient Gender: M Patient Age:   63Y Exam Location:  [facility] Procedure:      VAS US CAROTID Referring Phys: [id number] TIMOTHY S OPYD --------------------------------------------------------------------------------  Indications:      CVA and Speech disturbance. Risk Factors:     Hypertension, hyperlipidemia, Diabetes, past history of                   smoking. Comparison Study: 12/16/2017- pre-CABG Dopplers: bilateral 1-39% ICA stenosis. Performing Technologist: Gertie Fey MHA, RDMS, RVT, RDCS  Examination Guidelines: A complete evaluation includes B-mode imaging, spectral Doppler, color Doppler, and power Doppler as needed of all accessible portions of each vessel. Bilateral testing is considered an integral part of a complete examination. Limited examinations for reoccurring  indications may be performed as noted.  Right Carotid Findings: +----------+-------+--------+--------+-----------------------+-----------------+           PSV    EDV cm/sStenosisPlaque Description     Comments                    cm/s                                                            +----------+-------+--------+--------+-----------------------+-----------------+ CCA Prox  80     16                                                       +----------+-------+--------+--------+-----------------------+-----------------+ CCA Distal73     15                                                       +----------+-------+--------+--------+-----------------------+-----------------+ ICA Prox  50     9                                      intimal                                                                   thickening        +----------+-------+--------+--------+-----------------------+-----------------+ ICA Distal74     20              smooth and heterogenous                  +----------+-------+--------+--------+-----------------------+-----------------+ ECA       195    23              heterogenous and  calcific                                 +----------+-------+--------+--------+-----------------------+-----------------+ +----------+--------+-------+----------------+-------------------+           PSV cm/sEDV cmsDescribe        Arm Pressure (mmHG) +----------+--------+-------+----------------+-------------------+ Subclavian160            Multiphasic, WNL                    +----------+--------+-------+----------------+-------------------+ +---------+--------+--+--------+--+---------+ VertebralPSV cm/s57EDV cm/s11Antegrade +---------+--------+--+--------+--+---------+  Left Carotid Findings: +----------+--------+--------+--------+------------------+--------+           PSV cm/sEDV  cm/sStenosisPlaque DescriptionComments +----------+--------+--------+--------+------------------+--------+ CCA Prox  80      11                                         +----------+--------+--------+--------+------------------+--------+ CCA Distal70      16                                         +----------+--------+--------+--------+------------------+--------+ ICA Prox  386     125     80-99%                             +----------+--------+--------+--------+------------------+--------+ ICA Mid   255     40                                         +----------+--------+--------+--------+------------------+--------+ ICA Distal163     26                                         +----------+--------+--------+--------+------------------+--------+ ECA       229     27                                         +----------+--------+--------+--------+------------------+--------+ +----------+--------+--------+----------------+-------------------+           PSV cm/sEDV cm/sDescribe        Arm Pressure (mmHG) +----------+--------+--------+----------------+-------------------+ [id number]             Multiphasic, WNL                    +----------+--------+--------+----------------+-------------------+ +---------+--------+--+--------+--+---------+ VertebralPSV cm/s58EDV cm/s13Antegrade +---------+--------+--+--------+--+---------+   Summary: Right Carotid: Velocities in the right ICA are consistent with a 1-39% stenosis. Left Carotid: Velocities in the left ICA are consistent with a 80-99% stenosis.               Significant progression of disease when compared to previous study               12/16/2017. Vertebrals:  Bilateral vertebral arteries demonstrate antegrade flow. Subclavians: Normal flow hemodynamics were seen in bilateral subclavian              arteries. *See table(s) above for measurements and observations.  Electronically signed by Delia Heady MD on  03/26/2021 at 2:00:49 PM.  Final    IR ANGIO VERTEBRAL SEL SUBCLAVIAN INNOMINATE BILAT MOD SED  Result Date: 03/28/2021 CLINICAL DATA:  New onset of expressive aphasia. Severe narrowing of the left internal carotid artery proximally on ultrasound of the head and neck. EXAM: IR ANGIO EXTERNAL CAROTID SEL EXT CAROTID BILAT MOD SED COMPARISON:  Recent MRI of the brain and MRA of the brain. MEDICATIONS: Heparin 1000 units IV; no antibiotic was administered within 1 hour of the procedure. ANESTHESIA/SEDATION: Versed 1 mg IV; Fentanyl 25 mcg IV Moderate Sedation Time:  36 minutes The patient was continuously monitored during the procedure by the interventional radiology nurse under my direct supervision. CONTRAST:  Omnipaque 300 65 mL. FLUOROSCOPY TIME:  Fluoroscopy Time: 14 minutes 18 seconds (984.6 mGy). COMPLICATIONS: None immediate. TECHNIQUE: Informed written consent was obtained from the patient after a thorough discussion of the procedural risks, benefits and alternatives. All questions were addressed. Maximal Sterile Barrier Technique was utilized including caps, mask, sterile gowns, sterile gloves, sterile drape, hand hygiene and skin antiseptic. A timeout was performed prior to the initiation of the procedure. The right groin was prepped and draped in the usual sterile fashion. Thereafter using modified Seldinger technique, transfemoral access into the right common femoral artery was obtained without difficulty. Over a 0.035 inch guidewire, a 5 French Pinnacle sheath was inserted. Through this, and also over 0.035 inch guidewire, a 5 [place] JB 1 catheter was advanced to the aortic arch region and selectively positioned in the right common carotid artery, the right vertebral artery, the left common carotid artery and the left vertebral artery. FINDINGS:.: FINDINGS:. The right vertebral artery origin is widely patent. The vessel is seen to opacify to the cranial skull base. Wide patency is seen of the left  vertebrobasilar junction and the left posterior-inferior cerebellar artery. The basilar artery, the posterior cerebral arteries, the superior cerebellar arteries and the anterior-inferior cerebellar arteries opacify into the capillary and venous phases. Unopacified blood is seen in the basilar artery from the contralateral vertebral artery. The left common carotid arteriogram demonstrates the left external carotid artery to be mildly narrowed at its origin. Its branches are widely patent. The left internal carotid artery just distal to the bulb has a severe pre occlusive stenosis secondary to a circumferential plaque. More distally ascent of contrast is seen to the cranial skull base in the left internal carotid artery. The petrous, the cavernous and the supraclinoid segments are widely patent. The left middle cerebral artery is seen to opacify into the capillary venous phases with unopacified blood seen in the left middle cerebral artery from the contralateral right ICA via the anterior communicating artery. No antegrade opacification of the left anterior cerebral artery is seen. The right common carotid arteriogram demonstrates approximately 50-60% stenosis at the origin of the right external carotid artery. The right internal carotid artery at the bulb to the cranial skull base is widely patent. The petrous, the cavernous and the supraclinoid segments are patent with mild arteriosclerotic changes seen in the distal cavernous segment. The supraclinoid right ICA is widely patent. The right middle cerebral artery and the right anterior cerebral artery opacify into the capillary and venous phases. Prompt cross-filling via the anterior communicating artery of the left anterior cerebral artery A2 segment and distally, the left A1 segment and the left middle cerebral artery transiently is seen. The right vertebral artery origin is widely patent. The vessel is seen to opacify to the cranial skull base. Patency is seen of  the right vertebrobasilar junction and  the right posterior-inferior cerebellar artery. The basilar artery, the posterior cerebral arteries, the superior cerebellar arteries and the anterior-inferior cerebellar arteries opacify into the capillary and venous phases. Transient opacification of the left vertebrobasilar junction is seen from the right vertebral artery injection. 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 PLAN: Findings reviewed with the patient and the spouse, and referring neurologist. It was decided to proceed with endovascular revascularization of the severely stenotic symptomatic left internal carotid artery as soon as possible. Electronically Signed   By: Julieanne Cotton M.D.   On: 03/27/2021 09:04   IR ANGIO EXTERNAL CAROTID SEL EXT CAROTID BILAT MOD SED  Result Date: 03/28/2021 CLINICAL DATA:  New onset of expressive aphasia. Severe narrowing of the left internal carotid artery proximally on ultrasound of the head and neck. EXAM: IR ANGIO EXTERNAL CAROTID SEL EXT CAROTID BILAT MOD SED COMPARISON:  Recent MRI of the brain and MRA of the brain. MEDICATIONS: Heparin 1000 units IV; no antibiotic was administered within 1 hour of the procedure. ANESTHESIA/SEDATION: Versed 1 mg IV; Fentanyl 25 mcg IV Moderate Sedation Time:  36 minutes The patient was continuously monitored during the procedure by the interventional radiology nurse under my direct supervision. CONTRAST:  Omnipaque 300 65 mL. FLUOROSCOPY TIME:  Fluoroscopy Time: 14 minutes 18 seconds (984.6 mGy). COMPLICATIONS: None immediate. TECHNIQUE: Informed written consent was obtained from the patient after a thorough discussion of the procedural risks, benefits and alternatives. All questions were addressed. Maximal Sterile Barrier Technique was utilized including caps, mask, sterile gowns, sterile gloves, sterile drape, hand  hygiene and skin antiseptic. A timeout was performed prior to the initiation of the procedure. The right groin was prepped and draped in the usual sterile fashion. Thereafter using modified Seldinger technique, transfemoral access into the right common femoral artery was obtained without difficulty. Over a 0.035 inch guidewire, a 5 French Pinnacle sheath was inserted. Through this, and also over 0.035 inch guidewire, a 5 [place] JB 1 catheter was advanced to the aortic arch region and selectively positioned in the right common carotid artery, the right vertebral artery, the left common carotid artery and the left vertebral artery. FINDINGS:.: FINDINGS:. The right vertebral artery origin is widely patent. The vessel is seen to opacify to the cranial skull base. Wide patency is seen of the left vertebrobasilar junction and the left posterior-inferior cerebellar artery. The basilar artery, the posterior cerebral arteries, the superior cerebellar arteries and the anterior-inferior cerebellar arteries opacify into the capillary and venous phases. Unopacified blood is seen in the basilar artery from the contralateral vertebral artery. The left common carotid arteriogram demonstrates the left external carotid artery to be mildly narrowed at its origin. Its branches are widely patent. The left internal carotid artery just distal to the bulb has a severe pre occlusive stenosis secondary to a circumferential plaque. More distally ascent of contrast is seen to the cranial skull base in the left internal carotid artery. The petrous, the cavernous and the supraclinoid segments are widely patent. The left middle cerebral artery is seen to opacify into the capillary venous phases with unopacified blood seen in the left middle cerebral artery from the contralateral right ICA via the anterior communicating artery. No antegrade opacification of the left anterior cerebral artery is seen. The right common carotid arteriogram  demonstrates approximately 50-60% stenosis at the origin of the right external carotid artery. The right internal carotid artery at the bulb  to the cranial skull base is widely patent. The petrous, the cavernous and the supraclinoid segments are patent with mild arteriosclerotic changes seen in the distal cavernous segment. The supraclinoid right ICA is widely patent. The right middle cerebral artery and the right anterior cerebral artery opacify into the capillary and venous phases. Prompt cross-filling via the anterior communicating artery of the left anterior cerebral artery A2 segment and distally, the left A1 segment and the left middle cerebral artery transiently is seen. The right vertebral artery origin is widely patent. The vessel is seen to opacify to the cranial skull base. Patency is seen of the right vertebrobasilar junction and the right posterior-inferior cerebellar artery. The basilar artery, the posterior cerebral arteries, the superior cerebellar arteries and the anterior-inferior cerebellar arteries opacify into the capillary and venous phases. Transient opacification of the left vertebrobasilar junction is seen from the right vertebral artery injection. 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 PLAN: Findings reviewed with the patient and the spouse, and referring neurologist. It was decided to proceed with endovascular revascularization of the severely stenotic symptomatic left internal carotid artery as soon as possible. Electronically Signed   By: Julieanne Cotton M.D.   On: 03/27/2021 09:04     Discharge Exam: Vitals:   03/30/21 0800 03/30/21 0900  BP: (P) 139/62   Pulse: (!) (P) 53 60  Resp: (!) (P) 22 15  Temp: (P) 98.5 F (36.9 C)   SpO2: (P) 96% 95%   Vitals:   03/30/21 0400 03/30/21 0700 03/30/21 0800 03/30/21 0900  BP: 113/61  (P) 139/62   Pulse: (!) 48  (!) 47 (!) (P) 53 60  Resp: 15 18 (!) (P) 22 15  Temp:   (P) 98.5 F (36.9 C)   TempSrc:   (P) Axillary   SpO2: 97% 95% (P) 96% 95%  Weight:      Height:        General: Pt is alert, awake, not in acute distress Cardiovascular: RRR, S1/S2 +, no rubs, no gallops Respiratory: CTA bilaterally, no wheezing, no rhonchi Abdominal: Soft, NT, ND, bowel sounds + Extremities: no edema, no cyanosis Neurology: Alert and oriented, cranial nerves II to XII intact.  No other focal deficit except mild expressive dysarthria.    The results of significant diagnostics from this hospitalization (including imaging, microbiology, ancillary and laboratory) are listed below for reference.     Microbiology: Recent Results (from the past 240 hour(s))  Resp Panel by RT-PCR (Flu A&B, Covid) Nasopharyngeal Swab     Status: None   Collection Time: 03/25/21  7:31 PM   Specimen: Nasopharyngeal Swab; Nasopharyngeal(NP) swabs in vial transport medium  Result Value Ref Range Status   SARS Coronavirus 2 by RT PCR NEGATIVE NEGATIVE Final    Comment: (NOTE) SARS-CoV-2 target nucleic acids are NOT DETECTED.  The SARS-CoV-2 RNA is generally detectable in upper respiratory specimens during the acute phase of infection. The lowest concentration of SARS-CoV-2 viral copies this assay can detect is 138 copies/mL. A negative result does not preclude SARS-Cov-2 infection and should not be used as the sole basis for treatment or other patient management decisions. A negative result may occur with  improper specimen collection/handling, submission of specimen other than nasopharyngeal swab, presence of viral mutation(s) within the areas targeted by this assay, and inadequate number of viral copies(<138 copies/mL). A negative result must be combined with clinical observations, patient history, and epidemiological  information. The expected result is Negative.  Fact Sheet for Patients:   BloggerCourse.com  Fact Sheet for Healthcare Providers:  SeriousBroker.it  This test is no t yet approved or cleared by the [place] FDA and  has been authorized for detection and/or diagnosis of SARS-CoV-2 by FDA under an Emergency Use Authorization (EUA). This EUA will remain  in effect (meaning this test can be used) for the duration of the COVID-19 declaration under Section 564(b)(1) of the Act, 21 U.S.C.section 360bbb-3(b)(1), unless the authorization is terminated  or revoked sooner.       Influenza A by PCR NEGATIVE NEGATIVE Final   Influenza B by PCR NEGATIVE NEGATIVE Final    Comment: (NOTE) The Xpert Xpress SARS-CoV-2/FLU/RSV plus assay is intended as an aid in the diagnosis of influenza from Nasopharyngeal swab specimens and should not be used as a sole basis for treatment. Nasal washings and aspirates are unacceptable for Xpert Xpress SARS-CoV-2/FLU/RSV testing.  Fact Sheet for Patients: BloggerCourse.com  Fact Sheet for Healthcare Providers: SeriousBroker.it  This test is not yet approved or cleared by the [place] FDA and has been authorized for detection and/or diagnosis of SARS-CoV-2 by FDA under an Emergency Use Authorization (EUA). This EUA will remain in effect (meaning this test can be used) for the duration of the COVID-19 declaration under Section 564(b)(1) of the Act, 21 U.S.C. section 360bbb-3(b)(1), unless the authorization is terminated or revoked.  Performed at [facility], [address], [place], [place] [address]   MRSA Next Gen by PCR, Nasal     Status: None   Collection Time: 03/27/21  3:10 PM   Specimen: Nasal Mucosa; Nasal Swab  Result Value Ref Range Status   MRSA by PCR Next Gen NOT DETECTED NOT DETECTED Final    Comment: (NOTE) The GeneXpert MRSA Assay (FDA approved for NASAL specimens only), is one component of a  comprehensive MRSA colonization surveillance program. It is not intended to diagnose MRSA infection nor to guide or monitor treatment for MRSA infections. Test performance is not FDA approved in patients less than 4 years old. Performed at [facility], [address], [place], [place] [address]      Labs: BNP (last 3 results) No results for input(s): BNP in the last 8760 hours. Basic Metabolic Panel: Recent Labs  Lab 03/25/21 1536 03/25/21 1632 03/26/21 0345 03/27/21 0500 03/29/21 0645 03/30/21 0622  NA 139 141 137 136 139 139  K 4.2 4.1 3.8 4.0 3.6 3.8  CL 103 105 105 108 112* 108  CO2 28  --  27 20* 22 25  GLUCOSE 113* 108* 141* 134* 126* 134*  BUN 16 19 14 12 14 11   CREATININE 1.09 1.00 0.95 0.90 0.80 0.96  CALCIUM 10.0  --  9.5 9.2 8.9 9.1   Liver Function Tests: Recent Labs  Lab 03/25/21 1536  AST 23  ALT 24  ALKPHOS 43  BILITOT 0.6  PROT 6.8  ALBUMIN 4.0   No results for input(s): LIPASE, AMYLASE in the last 168 hours. No results for input(s): AMMONIA in the last 168 hours. CBC: Recent Labs  Lab 03/24/21 1037 03/25/21 1536 03/25/21 1632 03/26/21 0345 03/29/21 0645 03/30/21 0622  WBC 6.9 8.3  --  7.1 11.4* 7.6  NEUTROABS  --  5.6  --   --  8.5* 5.4  HGB 15.4 15.4 15.6 14.6 13.0 12.2*  HCT 45.9 46.5 46.0 43.4 38.7* 35.6*  MCV 89 89.8  --  88.6 89.6 89.0  PLT 239 250  --  215 198 195   Cardiac Enzymes: No results for input(s): CKTOTAL, CKMB, CKMBINDEX, TROPONINI in the last 168 hours. BNP: Invalid input(s): POCBNP CBG: Recent Labs  Lab 03/27/21 1130 03/28/21 1143 03/29/21 1936  GLUCAP 130* 165* 138*   D-Dimer No results for input(s): DDIMER in the last 72 hours. Hgb A1c No results for input(s): HGBA1C in the last 72 hours. Lipid Profile No results for input(s): CHOL, HDL, LDLCALC, TRIG, CHOLHDL, LDLDIRECT in the last 72 hours. Thyroid function studies No results for input(s): TSH, T4TOTAL, T3FREE, THYROIDAB in the last 72  hours.  Invalid input(s): FREET3 Anemia work up No results for input(s): VITAMINB12, FOLATE, FERRITIN, TIBC, IRON, RETICCTPCT in the last 72 hours. Urinalysis    Component Value Date/Time   COLORURINE YELLOW 03/25/2021 2239   APPEARANCEUR CLEAR 03/25/2021 2239   APPEARANCEUR Clear 01/10/2018 1224   LABSPEC 1.023 03/25/2021 2239   PHURINE 6.0 03/25/2021 2239   GLUCOSEU NEGATIVE 03/25/2021 2239   HGBUR NEGATIVE 03/25/2021 2239   BILIRUBINUR NEGATIVE 03/25/2021 2239   BILIRUBINUR Negative 01/10/2018 1224   KETONESUR NEGATIVE 03/25/2021 2239   PROTEINUR NEGATIVE 03/25/2021 2239   NITRITE NEGATIVE 03/25/2021 2239   LEUKOCYTESUR NEGATIVE 03/25/2021 2239   Sepsis Labs Invalid input(s): PROCALCITONIN,  WBC,  LACTICIDVEN Microbiology Recent Results (from the past 240 hour(s))  Resp Panel by RT-PCR (Flu A&B, Covid) Nasopharyngeal Swab     Status: None   Collection Time: 03/25/21  7:31 PM   Specimen: Nasopharyngeal Swab; Nasopharyngeal(NP) swabs in vial transport medium  Result Value Ref Range Status   SARS Coronavirus 2 by RT PCR NEGATIVE NEGATIVE Final    Comment: (NOTE) SARS-CoV-2 target nucleic acids are NOT DETECTED.  The SARS-CoV-2 RNA is generally detectable in upper respiratory specimens during the acute phase of infection. The lowest concentration of SARS-CoV-2 viral copies this assay can detect is 138 copies/mL. A negative result does not preclude SARS-Cov-2 infection and should not be used as the sole basis for treatment or other patient management decisions. A negative result may occur with  improper specimen collection/handling, submission of specimen other than nasopharyngeal swab, presence of viral mutation(s) within the areas targeted by this assay, and inadequate number of viral copies(<138 copies/mL). A negative result must be combined with clinical observations, patient history, and epidemiological information. The expected result is Negative.  Fact Sheet for  Patients:  BloggerCourse.com  Fact Sheet for Healthcare Providers:  SeriousBroker.it  This test is no t yet approved or cleared by the [place] FDA and  has been authorized for detection and/or diagnosis of SARS-CoV-2 by FDA under an Emergency Use Authorization (EUA). This EUA will remain  in effect (meaning this test can be used) for the duration of the COVID-19 declaration under Section 564(b)(1) of the Act, 21 U.S.C.section 360bbb-3(b)(1), unless the authorization is terminated  or revoked sooner.       Influenza A by PCR NEGATIVE NEGATIVE Final   Influenza B by PCR NEGATIVE NEGATIVE Final    Comment: (NOTE) The Xpert Xpress SARS-CoV-2/FLU/RSV plus assay is intended as an aid in the diagnosis of influenza from Nasopharyngeal swab specimens and should not be used as a sole basis for treatment. Nasal washings and aspirates are unacceptable for Xpert Xpress SARS-CoV-2/FLU/RSV testing.  Fact Sheet for Patients: BloggerCourse.com  Fact Sheet for Healthcare Providers: SeriousBroker.it  This test is not yet approved or cleared by the [place] FDA and has been authorized for detection and/or diagnosis of SARS-CoV-2 by FDA  under an Emergency Use Authorization (EUA). This EUA will remain in effect (meaning this test can be used) for the duration of the COVID-19 declaration under Section 564(b)(1) of the Act, 21 U.S.C. section 360bbb-3(b)(1), unless the authorization is terminated or revoked.  Performed at [facility], [address], [place], [place] [address]   MRSA Next Gen by PCR, Nasal     Status: None   Collection Time: 03/27/21  3:10 PM   Specimen: Nasal Mucosa; Nasal Swab  Result Value Ref Range Status   MRSA by PCR Next Gen NOT DETECTED NOT DETECTED Final    Comment: (NOTE) The GeneXpert MRSA Assay (FDA approved for NASAL specimens only), is one  component of a comprehensive MRSA colonization surveillance program. It is not intended to diagnose MRSA infection nor to guide or monitor treatment for MRSA infections. Test performance is not FDA approved in patients less than 79 years old. Performed at [facility], [address], [place], [place] [address]      Time coordinating discharge: Over 30 minutes  SIGNED:   Hughie Closs, MD  [facility] 03/30/2021, 1:38 PM  If 7PM-7AM, please contact night-coverage [email or web address]

## 2021-03-30 NOTE — Progress Notes (Signed)
STROKE TEAM PROGRESS NOTE   INTERVAL HISTORY No acute event overnight. Neuro stable. On ASA and brilinta, tolerating well. PT/OT recommend outpt PT/OT.    Vitals:   03/29/21 2346 03/30/21 0000 03/30/21 0300 03/30/21 0400  BP:  117/61  113/61  Pulse: (!) 52 (!) 52 (!) 47 (!) 48  Resp: 18 18 18 15   Temp: 98.7 F (37.1 C)  98.7 F (37.1 C)   TempSrc: Oral  Axillary   SpO2: 94% 94% 93% 97%  Weight:      Height:       CBC:  Recent Labs  Lab 03/29/21 0645 03/30/21 0622  WBC 11.4* 7.6  NEUTROABS 8.5* 5.4  HGB 13.0 12.2*  HCT 38.7* 35.6*  MCV 89.6 89.0  PLT 198 195   Basic Metabolic Panel:  Recent Labs  Lab 03/27/21 0500 03/29/21 0645  NA 136 139  K 4.0 3.6  CL 108 112*  CO2 20* 22  GLUCOSE 134* 126*  BUN 12 14  CREATININE 0.90 0.80  CALCIUM 9.2 8.9   Lipid Panel:  Recent Labs  Lab 03/26/21 0345  CHOL 154  TRIG 118  HDL 39*  CHOLHDL 3.9  VLDL 24  LDLCALC 91   HgbA1c:  Recent Labs  Lab 03/26/21 0345  HGBA1C 6.4*   Urine Drug Screen:  Recent Labs  Lab 03/25/21 2238  LABOPIA NONE DETECTED  COCAINSCRNUR NONE DETECTED  LABBENZ NONE DETECTED  AMPHETMU NONE DETECTED  THCU NONE DETECTED  LABBARB NONE DETECTED    Alcohol Level  Recent Labs  Lab 03/26/21 0345  ETH <10   Code Stroke CT head: left tempoparietal junction infarct acute vs subacute  MRI brain: multifocal acute infarcts within the posterior left MCA territory  Carotid 03/28/21: minimal right ICA stenosis. 80-89% stenosis of left ICA. Normal vertebral flow  Dr [place] 03/28/21  S/P  prox LICA revascularization with stent assisted angioplasty with distal protection for symptomatic stenosis. Rt rad route for angioplasty with distal protection and RT CFA route for stenting with distal protection . Placement of stent vis rad route unsucceessful due to vessel anatomy.   PHYSICAL EXAM  Temp:  [98.6 F (37 C)-99.3 F (37.4 C)] 98.7 F (37.1 C) (07/24 0300) Pulse Rate:  [47-71] 48 (07/24  0400) Resp:  [14-24] 15 (07/24 0400) BP: (97-143)/(54-78) 113/61 (07/24 0400) SpO2:  [93 %-97 %] 97 % (07/24 0400)  General - Well nourished, well developed, in no apparent distress.  Ophthalmologic - fundi not visualized due to noncooperation.  Cardiovascular - Regular rhythm and rate.  Mental Status -  Level of arousal and orientation to time, place, and person were intact. Language including expression, naming, repetition, comprehension was assessed and found intact.  Cranial Nerves II - XII - II - Visual field intact OU. Left eye crescent shaped vision loss in the central vision.  III, IV, VI - Extraocular movements intact. V - Facial sensation intact bilaterally. VII - Facial movement intact bilaterally. VIII - Hearing & vestibular intact bilaterally. X - Palate elevates symmetrically. XI - Chin turning & shoulder shrug intact bilaterally. XII - Tongue protrusion intact.  Motor Strength - The patient's strength was normal in all extremities and pronator drift was absent. Bulk was normal and fasciculations were absent.   Motor Tone - Muscle tone was assessed at the neck and appendages and was normal.  Reflexes - The patient's reflexes were symmetrical in all extremities and he had no pathological reflexes.  Sensory - Light touch, temperature/pinprick were assessed and were  symmetrical.    Coordination - The patient had normal movements in the hands and feet with no ataxia or dysmetria.  Tremor was absent.  Gait and Station - deferred.    ASSESSMENT/PLAN Stephen Mullins is a 75 y.o. male with PMH significant for hyperlipidemia, hypertension, GERD, aortic valve replacement who presented with transient right facial numbness and facial droop and was found to have a left frontoparietal infarct. Symptoms had resolved and NIHHS 0 at time of admission. He did not receive tPA.   Stroke - multifocal left MCA territory infarcts likely due to symptomatic high-grade proximal left ICA  stenosis CT head: left tempoparietal junction infarct acute vs subacute MRI brain: multifocal acute infarcts within the posterior left MCA territory MRA head unremarkable Carotid US: 80-89% stenosis of left ICA.  IR angiogram: Preocclusive severe proximal left ICA stenosis s/p stent 2D Echo EF 60 to 65%.   LDL 91 HgbA1c 6.4 On aspirin 81 PTA, now on DAPT with Brilinta and aspirin following left carotid stent. Follow up with Dr. Corliss Skains as outpt Therapy recommendations:  outpatient PT and OT Deposition - pending  BRAO Patient also complaining of left central vision crescent-shaped visual loss Occurred along with stroke symptoms Also likely due to symptomatic high-grade left ICA proximal stenosis Status post ICA stenting On aspirin and Brilinta Follow-up with ophthalmology as outpatient  Hypertension Home meds including HCTZ and lisinopril Stable on the low side Now on no BP meds Blood pressure goal < 180/105. Long-term BP goal normotensive  Hyperlipidemia Home meds Lipitor 10 LDL 91, goal < 70 Increase Lipitor to 40 mg daily Continue statin on discharge  Other Stroke Risk Factors Advanced Age >/= 60  Former tobacco user, quit in 2003 CAD. Recent abnormal stress test. He was supposed to undergo a [facility] 7/21 however will need to reschedule AVR  Other acute issues    Hospital day # 4  Neurology will sign off. Please call with questions. Pt will follow up with stroke clinic NP at [facility] in about 4 weeks. Thanks for the consult.  Marvel Plan, MD PhD Stroke Neurology 03/30/2021 11:20 AM    To contact Stroke Continuity provider, please refer to [email or web address]. After hours, contact General Neurology

## 2021-03-30 NOTE — Discharge Instructions (Signed)
Recommendation from Dr. Corliss Skains:   - Continue Aspirin 81 mg every day - Continue Brilinta 90 mg twice a day - Drink plenty of water, until urine looks light yellow to clear - No bending, stooping, lifting more than 10 pounds for  2 weeks - Avoid driving for 2-3 weeks - No further dressing change needed for right wrist and right groin puncture site, keep the sites dry and clean until fully heals. Check for signs of arterial bleeding such as rapidly enlarging bruise, pain, and hardening under the puncture site. - Do not submerge (bathing or swimming) for next 7 days. - 2 week f/u with Dr. Corliss Skains at [facility], our schedule will call you the sent up the appointment.

## 2021-03-31 ENCOUNTER — Encounter (HOSPITAL_COMMUNITY): Payer: Self-pay

## 2021-04-03 ENCOUNTER — Encounter (HOSPITAL_COMMUNITY): Payer: Self-pay

## 2021-04-03 ENCOUNTER — Other Ambulatory Visit (HOSPITAL_COMMUNITY): Payer: Self-pay | Admitting: Interventional Radiology

## 2021-04-03 DIAGNOSIS — I771 Stricture of artery: Secondary | ICD-10-CM

## 2021-04-03 HISTORY — PX: IR ANGIO INTRA EXTRACRAN SEL COM CAROTID INNOMINATE UNI L MOD SED: IMG5358

## 2021-04-04 DIAGNOSIS — I10 Essential (primary) hypertension: Secondary | ICD-10-CM | POA: Diagnosis not present

## 2021-04-04 DIAGNOSIS — Z8673 Personal history of transient ischemic attack (TIA), and cerebral infarction without residual deficits: Secondary | ICD-10-CM | POA: Diagnosis not present

## 2021-04-04 DIAGNOSIS — Z09 Encounter for follow-up examination after completed treatment for conditions other than malignant neoplasm: Secondary | ICD-10-CM | POA: Diagnosis not present

## 2021-04-07 DIAGNOSIS — Z6826 Body mass index (BMI) 26.0-26.9, adult: Secondary | ICD-10-CM | POA: Diagnosis not present

## 2021-04-07 DIAGNOSIS — I809 Phlebitis and thrombophlebitis of unspecified site: Secondary | ICD-10-CM | POA: Diagnosis not present

## 2021-04-10 DIAGNOSIS — H34212 Partial retinal artery occlusion, left eye: Secondary | ICD-10-CM | POA: Diagnosis not present

## 2021-04-11 ENCOUNTER — Ambulatory Visit (HOSPITAL_COMMUNITY)
Admission: RE | Admit: 2021-04-11 | Discharge: 2021-04-11 | Disposition: A | Discharge status: Home / Self Care | Payer: Medicare HMO | Source: Ambulatory Visit | Attending: Student | Admitting: Student

## 2021-04-11 ENCOUNTER — Other Ambulatory Visit: Payer: Self-pay

## 2021-04-11 DIAGNOSIS — I63232 Cerebral infarction due to unspecified occlusion or stenosis of left carotid arteries: Secondary | ICD-10-CM | POA: Diagnosis not present

## 2021-04-15 DIAGNOSIS — I8312 Varicose veins of left lower extremity with inflammation: Secondary | ICD-10-CM | POA: Diagnosis not present

## 2021-04-15 HISTORY — PX: IR RADIOLOGIST EVAL & MGMT: IMG5224

## 2021-04-23 DIAGNOSIS — E1169 Type 2 diabetes mellitus with other specified complication: Secondary | ICD-10-CM | POA: Diagnosis not present

## 2021-04-23 DIAGNOSIS — G459 Transient cerebral ischemic attack, unspecified: Secondary | ICD-10-CM | POA: Diagnosis not present

## 2021-04-23 DIAGNOSIS — I10 Essential (primary) hypertension: Secondary | ICD-10-CM | POA: Diagnosis not present

## 2021-04-23 DIAGNOSIS — I480 Paroxysmal atrial fibrillation: Secondary | ICD-10-CM | POA: Diagnosis not present

## 2021-04-23 DIAGNOSIS — E7800 Pure hypercholesterolemia, unspecified: Secondary | ICD-10-CM | POA: Diagnosis not present

## 2021-05-06 ENCOUNTER — Telehealth: Payer: Self-pay | Admitting: Cardiology

## 2021-05-06 DIAGNOSIS — I8312 Varicose veins of left lower extremity with inflammation: Secondary | ICD-10-CM | POA: Diagnosis not present

## 2021-05-06 NOTE — Telephone Encounter (Signed)
Spoke with pt who had a procedure completed earlier today. He was advised to take ibuprofen or naprosyn for pain is concerned because he takes Brilinta. Reviewed with MD and advised pt OK to take short term for a few nights only.  Pt states understanding and will c/b if any questions or concerns.

## 2021-05-06 NOTE — Telephone Encounter (Signed)
Patient call in to see if the pain medication he received with it from his dr. He wants to be make sure ifs okay to take medication are long with the brilliant. Please advise

## 2021-05-08 ENCOUNTER — Ambulatory Visit: Payer: Medicare HMO | Admitting: Adult Health

## 2021-05-08 ENCOUNTER — Encounter: Payer: Self-pay | Admitting: Adult Health

## 2021-05-08 VITALS — BP 140/84 | HR 68 | Ht 71.0 in | Wt 188.0 lb

## 2021-05-08 DIAGNOSIS — E785 Hyperlipidemia, unspecified: Secondary | ICD-10-CM

## 2021-05-08 DIAGNOSIS — I10 Essential (primary) hypertension: Secondary | ICD-10-CM | POA: Diagnosis not present

## 2021-05-08 DIAGNOSIS — H34232 Retinal artery branch occlusion, left eye: Secondary | ICD-10-CM | POA: Diagnosis not present

## 2021-05-08 DIAGNOSIS — I63232 Cerebral infarction due to unspecified occlusion or stenosis of left carotid arteries: Secondary | ICD-10-CM

## 2021-05-08 NOTE — Progress Notes (Signed)
[facility] [address] [place]. [place] [address] [phone]       HOSPITAL FOLLOW UP NOTE  Stephen Mullins Date of Birth:  1945/12/11 Medical Record Number:  [id number]   Reason for Referral:  hospital stroke follow up    SUBJECTIVE:   CHIEF COMPLAINT:  Chief Complaint  Patient presents with   Cerebrovascular Accident    Rm 2 hospital FU wife- [name]  "doing well, did not do any therapies; Brilinta gives me SOB w/exertion; want to know if I can take NSAIDS as needed on limited basis"    HPI:   Stephen Mullins is a 75 y.o. male with PMH significant for hyperlipidemia, hypertension, GERD, aortic valve replacement who presented on 03/25/2021 with transient right facial numbness and facial droop and left central vision crescent-shaped visual loss.  Personally reviewed hospitalization pertinent progress notes, lab work and imaging.  Evaluated by Dr. 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  IR angio preocclusive severe proximal left ICA stenosis s/p stent by Dr. Corliss Skains.  DAPT initiated with Brilinta and aspirin and follow-up with Dr. Corliss Skains outpatient.  EF 60 to 65%.  LDL 91.  A1c 6.4.  Increase home dose atorvastatin 10 mg to 40 mg daily.  Other stroke risk factors include advanced age, former tobacco use, CAD and AVR.  No prior stroke history.  Advised to follow-up outpatient with ophthalmology for BRAO.  Therapy eval recommended outpatient PT/OT.  Today, 05/08/2021, Mr. Kiedrowski is being seen for hospital follow-up accompanied by his wife, [name].  Overall doing well.  Recovered physically 4 to 5 days after discharge.  He continues to have visual loss in left eye but does not interfere with daily functioning.  He has returned back to all prior activities including driving.  Routinely followed by ophthalmology.  Denies new stroke/TIA symptoms.   Remains on Brilinta and aspirin - reports SOB on exertion on Brilinta but does not effect daily functioning.  Remains on atorvastatin 40 mg daily.  Blood pressure today 140/84. Occasionally monitors at home and typicaly stable - 110-120s/60s. Can feel dizzy at times if stands too quickly. Eval by Dr. Corliss Skains with plans on repeating carotid ultrasound towards the end of this year.  No further concerns at this time.     PERTINENT IMAGING  Code Stroke CT head: left tempoparietal junction infarct acute vs subacute   MRI brain: multifocal acute infarcts within the posterior left MCA territory   Carotid US: minimal right ICA stenosis. 80-89% stenosis of left ICA. Normal vertebral flow   Dr Corliss Skains 03/28/21  S/P  prox LICA revascularization with stent assisted angioplasty with distal protection for symptomatic stenosis. Rt rad route for angioplasty with distal protection and RT CFA route for stenting with distal protection . Placement of stent vis rad route unsucceessful due to vessel anatomy.    ROS:   14 system review of systems performed and negative with exception of those listed in HPI  PMH:  Past Medical History:  Diagnosis Date   Bicuspid aortic valve    with aortic stenosis and mild insufficiency, mild to moderate aortic root dilitation   Borderline hyperlipidemia    Diabetes mellitus without complication (HCC)    Elevated fasting glucose    Elevated PSA    due to prostatitis   GERD (gastroesophageal reflux disease)    Heart murmur    Hypertension  Nail fungus    Thoracic ascending aortic aneurysm (HCC)     PSH:  Past Surgical History:  Procedure Laterality Date   AORTIC VALVE REPLACEMENT N/A 12/20/2017   Procedure: AORTIC VALVE REPLACEMENT (AVR);  Surgeon: Donata ClayVan Trigt, Theron AristaPeter, MD;  [facility];  Service: Open Heart Surgery;  Laterality: N/A;   CATARACT EXTRACTION, BILATERAL     IR ANGIO INTRA EXTRACRAN SEL COM CAROTID INNOMINATE BILAT MOD SED  03/26/2021   IR  ANGIO INTRA EXTRACRAN SEL COM CAROTID INNOMINATE UNI L MOD SED  04/03/2021   IR ANGIO VERTEBRAL SEL SUBCLAVIAN INNOMINATE BILAT MOD SED  03/26/2021   IR CT HEAD LTD  03/28/2021   IR INTRAVSC STENT CERV CAROTID W/EMB-PROT MOD SED INCL ANGIO  03/28/2021   IR RADIOLOGIST EVAL & MGMT  04/15/2021   IR US GUIDE VASC ACCESS RIGHT  03/28/2021   RADIOLOGY WITH ANESTHESIA N/A 03/28/2021   Procedure: STENT PLACMENT;  Surgeon: Julieanne Cottoneveshwar, Sanjeev, MD;  Location: [facility] OR;  Service: Radiology;  Laterality: N/A;   RIGHT/LEFT HEART CATH AND CORONARY ANGIOGRAPHY N/A 11/09/2017   Procedure: RIGHT/LEFT HEART CATH AND CORONARY ANGIOGRAPHY;  Surgeon: Kathleene HazelMcAlhany, Christopher D, MD;  Location: [facility] INVASIVE CV LAB;  Service: Cardiovascular;  Laterality: N/A;   TEE WITHOUT CARDIOVERSION N/A 12/20/2017   Procedure: TRANSESOPHAGEAL ECHOCARDIOGRAM (TEE);  Surgeon: Donata ClayVan Trigt, Theron AristaPeter, MD;  [facility];  Service: Open Heart Surgery;  Laterality: N/A;    Social History:  Social History   Socioeconomic History   Marital status: Married    Spouse name: Not on file   Number of children: Not on file   Years of education: Not on file   Highest education level: Not on file  Occupational History   Occupation: retired  Tobacco Use   Smoking status: Former    Types: Cigarettes    Quit date: 10/20/2001    Years since quitting: 19.5   Smokeless tobacco: Never  Vaping Use   Vaping Use: Never used  Substance and Sexual Activity   Alcohol use: Yes    Alcohol/week: 1.0 - 2.0 standard drink    Types: 1 - 2 Glasses of wine per week   Drug use: No   Sexual activity: Not on file  Other Topics Concern   Not on file  Social History Narrative   Married 55 yrs (as of July 2022). Enjoys golf.     Social Determinants of Health   Financial Resource Strain: Not on file  Food Insecurity: Not on file  Transportation Needs: Not on file  Physical Activity: Not on file  Stress: Not on file  Social Connections: Not on file  Intimate Partner  Violence: Not on file    Family History:  Family History  Problem Relation Age of Onset   Prostate cancer Father     Medications:   Current Outpatient Medications on File Prior to Visit  Medication Sig Dispense Refill   acetaminophen (TYLENOL) 500 MG tablet Take 2 tablets (1,000 mg total) by mouth every 6 (six) hours as needed. 30 tablet 0   amoxicillin (AMOXIL) 500 MG capsule TAKE 4 TABLETS BY MOUTH 1 HOUR BEFORE DENTAL PROCEDURE 4 capsule 3   aspirin EC 81 MG tablet Take 81 mg by mouth in the morning. Swallow whole.     atorvastatin (LIPITOR) 40 MG tablet Take 1 tablet (40 mg total) by mouth in the morning. 30 tablet 0   BRILINTA 90 MG TABS tablet Take 90 mg by mouth 2 (two) times daily.  Efinaconazole 10 % SOLN Apply 1 drop topically daily. 4 mL 11   hydrochlorothiazide (HYDRODIURIL) 12.5 MG tablet Take 1 tablet by mouth once daily 90 tablet 3   hydrocortisone cream 1 % Apply 1 application topically daily as needed for itching.     lisinopril (ZESTRIL) 5 MG tablet Take 5 mg by mouth in the morning.     neomycin-bacitracin-polymyxin (NEOSPORIN) ointment Apply 1 application topically daily as needed for wound care.     oxymetazoline (AFRIN) 0.05 % nasal spray Place 1 spray into both nostrils daily as needed for congestion.     No current facility-administered medications on file prior to visit.    Allergies:   Allergies  Allergen Reactions   Tetanus Toxoid, Adsorbed Other (See Comments)    Pt. States as small child he had tetnus made from horse serum and he turned 'blue' ?? SHORTNESS OF BREATH ??      OBJECTIVE:  Physical Exam  Vitals:   05/08/21 1105  BP: 140/84  Pulse: 68  Weight: 188 lb (85.3 kg)  Height: 5\' 11"  (1.803 m)   Body mass index is 26.22 kg/m. No results found.  Post stroke PHQ 2/9 Depression screen PHQ 2/9 05/03/2019  Decreased Interest 0  Down, Depressed, Hopeless 0  PHQ - 2 Score 0     General: well developed, well nourished, very  pleasant elderly Caucasian male, seated, in no evident distress Head: head normocephalic and atraumatic.   Neck: supple with no carotid or supraclavicular bruits Cardiovascular: regular rate and rhythm, no murmurs Musculoskeletal: no deformity Skin:  no rash/petichiae Vascular:  Normal pulses all extremities   Neurologic Exam Mental Status: Awake and fully alert.  Fluent speech and language.  Oriented to place and time. Recent and remote memory intact. Attention span, concentration and fund of knowledge appropriate. Mood and affect appropriate.  Cranial Nerves: Fundoscopic exam reveals sharp disc margins. Pupils equal, briskly reactive to light. Extraocular movements full without nystagmus. Visual fields full to confrontation OU but OS "hazy" appearance crescent shaped vision impairment of central vision. Hearing intact. Facial sensation intact. Face, tongue, palate moves normally and symmetrically.  Motor: Normal bulk and tone. Normal strength in all tested extremity muscles Sensory.: intact to touch , pinprick , position and vibratory sensation.  Coordination: Rapid alternating movements normal in all extremities. Finger-to-nose and heel-to-shin performed accurately bilaterally. Gait and Station: Arises from chair without difficulty. Stance is normal. Gait demonstrates normal stride length and balance without use of assistive device. Tandem walk and heel toe without difficulty.  Reflexes: 1+ and symmetric. Toes downgoing.     NIHSS  1 Modified Rankin  1      ASSESSMENT: Stephen Mullins is a 12 y.o. year old male with multifocal left MCA territory infarcts and L BRAO likely due to symptomatic high-grade proximal left ICA stenosis s/p stent on 03/25/2021 after presenting with transient right facial numbness and facial droop and OS central vision loss. Vascular risk factors include HTN, HLD, former tobacco use, CAD and AVR.      PLAN:  Left MCA stroke, L BRAO:  Residual deficit: OS  visual impairment.  No physical deficits.  Continue to follow with ophthalmology Continue aspirin 81 mg daily  and atorvastatin 40 mg daily for secondary stroke prevention.   Discussed secondary stroke prevention measures and importance of close PCP follow up for aggressive stroke risk factor management. I have gone over the pathophysiology of stroke, warning signs and symptoms, risk factors and their management in some  detail with instructions to go to the closest emergency room for symptoms of concern. L ICA stenosis: s/p stent by Dr. Corliss Skains.  Continue Brilinta managed by IR - advised to f/u with IR if SOB concerns worsen or limits daily functioning.  Repeat carotid ultrasound towards end of this year HTN: BP goal <130/90.  Stable on current regimen per PCP HLD: LDL goal <70. Recent LDL 91 on atorvastatin 40 mg daily monitored by PCP     Follow up in 6 months or call earlier if needed   CC:  [facility] provider: Dr. Pearlean Brownie PCP: Soundra Pilon, FNP    I spent 54 minutes of face-to-face and non-face-to-face time with patient and wife.  This included previsit chart review including review of recent hospitalization, lab review, study review, electronic health record documentation, patient and wife education regarding recent stroke including etiology, secondary stroke prevention measures and importance of managing stroke risk factors, residual deficits and answered all other questions to patient and wife's satisfaction   Ihor Austin, AGNP-BC  [facility] [address] [place], [place] [address]  Phone [phone] Fax [phone] Note: This document was prepared with digital dictation and possible smart phrase technology. Any transcriptional errors that result from this process are unintentional.

## 2021-05-08 NOTE — Patient Instructions (Signed)
Continue aspirin 81 mg daily and Brilinta (ticagrelor) 90 mg bid  and atorvastatin 40mg  daily  for secondary stroke prevention  Continue to follow up with PCP regarding cholesterol and blood pressure management  Maintain strict control of hypertension with blood pressure goal below 130/90 and cholesterol with LDL cholesterol (bad cholesterol) goal below 70 mg/dL.   Continue to follow with Dr. as advised     Followup in the future with me in 6 months or call earlier if needed       Thank you for coming to see Stephen Mullins at [facility]. I hope we have been able to provide you high quality care today.  You may receive a patient satisfaction survey over the next few weeks. We would appreciate your feedback and comments so that we may continue to improve ourselves and the health of our patients.

## 2021-05-09 ENCOUNTER — Telehealth: Payer: Self-pay | Admitting: Neurology

## 2021-05-09 DIAGNOSIS — I8312 Varicose veins of left lower extremity with inflammation: Secondary | ICD-10-CM | POA: Diagnosis not present

## 2021-05-09 DIAGNOSIS — H34212 Partial retinal artery occlusion, left eye: Secondary | ICD-10-CM | POA: Diagnosis not present

## 2021-05-09 NOTE — Telephone Encounter (Signed)
Dr. Cathey Endow called, examined patient from follow up and reports 2 new plaques/emboli in left eye after carotid stenting. He is already on Brilinta and asa. Dr. Cathey Endow is going to follow up with him in 6 weeks and send update to [place] at that time. At this time I am not sure there is anything else we can do for this gentleman who is already on DAPT but I will check with Dr. Pearlean Brownie. Dr. Lucia Gaskins

## 2021-05-15 ENCOUNTER — Ambulatory Visit: Payer: Medicare HMO | Admitting: Podiatry

## 2021-05-15 ENCOUNTER — Other Ambulatory Visit: Payer: Self-pay

## 2021-05-15 DIAGNOSIS — B351 Tinea unguium: Secondary | ICD-10-CM | POA: Diagnosis not present

## 2021-05-20 NOTE — Progress Notes (Signed)
Subjective: 75 year old male presents the office today for follow-up evaluation of onychomycosis.  Exam is limited to the right side as he has a compression sock on the left side that he does not want take off.  This difficulty take on and off.  Recently had a vascular procedure for his veins on the left side.  He states that he feels that the nail has been growing out on the right third toenail and he has not seen any swelling or redness.  No pain.  He has no other concerns.  Objective: AAO x3, NAD DP/PT pulses palpable bilaterally, CRT less than 3 seconds The nails most affected today are the hallux toenails as well as the right third digit toenail.  On the right third digit toenail does appear that is growing out most clearly proximal one half of the nail.  There is no pain.  No edema, erythema or signs of infection.  Right hallux toenail appears to be improved.  Not able to fully evaluate the left hallux toenail. No open lesions.  Assessment: Onychomycosis  Plan: -All treatment options discussed with the patient including all alternatives, risks, complications.  -He is finished Lamisil.  Still using topical Jublia and seems to be helping.  As a courtesy debride the nail with any complications or bleeding.  As does not cause any pain or any signs of infection would hold off on nail removal as it is getting better although slowly.  Vivi Barrack DPM

## 2021-06-02 DIAGNOSIS — I8312 Varicose veins of left lower extremity with inflammation: Secondary | ICD-10-CM | POA: Diagnosis not present

## 2021-06-05 DIAGNOSIS — E1169 Type 2 diabetes mellitus with other specified complication: Secondary | ICD-10-CM | POA: Diagnosis not present

## 2021-06-05 DIAGNOSIS — Z23 Encounter for immunization: Secondary | ICD-10-CM | POA: Diagnosis not present

## 2021-06-05 DIAGNOSIS — E7800 Pure hypercholesterolemia, unspecified: Secondary | ICD-10-CM | POA: Diagnosis not present

## 2021-06-05 DIAGNOSIS — I10 Essential (primary) hypertension: Secondary | ICD-10-CM | POA: Diagnosis not present

## 2021-06-05 DIAGNOSIS — I480 Paroxysmal atrial fibrillation: Secondary | ICD-10-CM | POA: Diagnosis not present

## 2021-06-05 DIAGNOSIS — D6859 Other primary thrombophilia: Secondary | ICD-10-CM | POA: Diagnosis not present

## 2021-06-06 ENCOUNTER — Ambulatory Visit: Payer: Medicare HMO | Admitting: Cardiology

## 2021-06-06 ENCOUNTER — Encounter: Payer: Self-pay | Admitting: Cardiology

## 2021-06-06 ENCOUNTER — Other Ambulatory Visit: Payer: Self-pay

## 2021-06-06 VITALS — BP 126/70 | HR 68 | Ht 71.0 in | Wt 193.0 lb

## 2021-06-06 DIAGNOSIS — I2510 Atherosclerotic heart disease of native coronary artery without angina pectoris: Secondary | ICD-10-CM | POA: Diagnosis not present

## 2021-06-06 DIAGNOSIS — Z01812 Encounter for preprocedural laboratory examination: Secondary | ICD-10-CM

## 2021-06-06 DIAGNOSIS — Z953 Presence of xenogenic heart valve: Secondary | ICD-10-CM | POA: Diagnosis not present

## 2021-06-06 DIAGNOSIS — I63232 Cerebral infarction due to unspecified occlusion or stenosis of left carotid arteries: Secondary | ICD-10-CM

## 2021-06-06 DIAGNOSIS — R9439 Abnormal result of other cardiovascular function study: Secondary | ICD-10-CM

## 2021-06-06 DIAGNOSIS — I77810 Thoracic aortic ectasia: Secondary | ICD-10-CM

## 2021-06-06 DIAGNOSIS — Q231 Congenital insufficiency of aortic valve: Secondary | ICD-10-CM | POA: Diagnosis not present

## 2021-06-06 DIAGNOSIS — Q2381 Bicuspid aortic valve: Secondary | ICD-10-CM

## 2021-06-06 DIAGNOSIS — E7800 Pure hypercholesterolemia, unspecified: Secondary | ICD-10-CM

## 2021-06-06 DIAGNOSIS — R0609 Other forms of dyspnea: Secondary | ICD-10-CM

## 2021-06-06 DIAGNOSIS — R0600 Dyspnea, unspecified: Secondary | ICD-10-CM | POA: Diagnosis not present

## 2021-06-06 MED ORDER — METOPROLOL TARTRATE 50 MG PO TABS: 50.0000 mg | ORAL_TABLET | Freq: Once | ORAL | 0 refills | Status: DC

## 2021-06-06 NOTE — Assessment & Plan Note (Signed)
Abnormal exercise treadmill test, ST depressions noted.  aVR elevation.  Prior cardiac catheterization in 2019 prior to his aortic valve replacement demonstrated mild CAD see above.  Since he has had some shortness of breath which may be an anginal equivalent and recent stroke, we will go ahead and check a coronary CT scan to exclude worsening coronary artery disease.

## 2021-06-06 NOTE — Assessment & Plan Note (Signed)
This will be assessed as well during coronary CT scan.

## 2021-06-06 NOTE — Assessment & Plan Note (Signed)
Left-sided carotid stent placed.  Post stroke.  On aspirin and Brilinta.

## 2021-06-06 NOTE — Progress Notes (Signed)
Cardiology Office Note:    Date:  06/08/2021   ID:  Stephen Mullins, DOB 1946-02-25, MRN [id number]  PCP:  Soundra Pilon, FNP   [facility] Providers Cardiologist:  Donato Schultz, MD     Referring MD: Soundra Pilon, FNP    History of Present Illness:    Stephen Mullins is a 75 y.o. male here for follow up to discuss recent catheterization.  At his last appointment we discussed abnormal GXT per MS. his treadmill showed marked ST segment depression diffusely with aVR elevation which is suggestive of multivessel coronary artery disease.  Interestingly in 2019 prior to his aortic valve replacement valve, he had mild nonobstructive disease noted on cardiac cath.  In the past he underwent an aortic valve replacement in April 2019 with a number 25 mm bioprosthetic valve.  LV function normal.  Dilated aorta.  Doing well.   Previous EKG showed sinus rhythm with ventricular bigeminy heart rate 52 but effective heart rate likely in the 30s secondary to PVCs.  Had no dizziness no syncope.  Walking 1 to 2 miles a day.  He was doing reasonably well. After his stress test on 03/18/21 he had no adverse effect. He never had negative symptoms when working outside or walking around his home. Otherwise he was tolerating his medication well.   Today: Overall, he is feeling better and has recovered well.   This past July 2022, he reports that initially he felt left facial numbness and speech difficulty following a 30 min nap after working in the yard. It was noted that he had a stroke.  Because of this, his prior cardiac catheterization was canceled.  Currently he is taking Brilinta, and endorses side effects of shortness of breath, including with exercise.  He denies any palpitations, or chest pain. No lightheadedness, headaches, syncope, orthopnea, or PND. Also has no lower extremity edema.   Past Medical History:  Diagnosis Date   Bicuspid aortic valve    with aortic stenosis and mild  insufficiency, mild to moderate aortic root dilitation   Borderline hyperlipidemia    Diabetes mellitus without complication (HCC)    Elevated fasting glucose    Elevated PSA    due to prostatitis   GERD (gastroesophageal reflux disease)    Heart murmur    Hypertension    Nail fungus    Thoracic ascending aortic aneurysm [facility])     Past Surgical History:  Procedure Laterality Date   AORTIC VALVE REPLACEMENT N/A 12/20/2017   Procedure: AORTIC VALVE REPLACEMENT (AVR);  Surgeon: [name], [name], MD;  Location: [facility] OR;  Service: Open Heart Surgery;  Laterality: N/A;   CATARACT EXTRACTION, BILATERAL     IR ANGIO INTRA EXTRACRAN SEL COM CAROTID INNOMINATE BILAT MOD SED  03/26/2021   IR ANGIO INTRA EXTRACRAN SEL COM CAROTID INNOMINATE UNI L MOD SED  04/03/2021   IR ANGIO VERTEBRAL SEL SUBCLAVIAN INNOMINATE BILAT MOD SED  03/26/2021   IR CT HEAD LTD  03/28/2021   IR INTRAVSC STENT CERV CAROTID W/EMB-PROT MOD SED INCL ANGIO  03/28/2021   IR RADIOLOGIST EVAL & MGMT  04/15/2021   IR US GUIDE VASC ACCESS RIGHT  03/28/2021   RADIOLOGY WITH ANESTHESIA N/A 03/28/2021   Procedure: STENT PLACMENT;  Surgeon: Julieanne Cotton, MD;  Location: [facility] OR;  Service: Radiology;  Laterality: N/A;   RIGHT/LEFT HEART CATH AND CORONARY ANGIOGRAPHY N/A 11/09/2017   Procedure: RIGHT/LEFT HEART CATH AND CORONARY ANGIOGRAPHY;  Surgeon: [name], MD;  Location: [facility]  INVASIVE CV LAB;  Service: Cardiovascular;  Laterality: N/A;   TEE WITHOUT CARDIOVERSION N/A 12/20/2017   Procedure: TRANSESOPHAGEAL ECHOCARDIOGRAM (TEE);  Surgeon: [name], [name], MD;  Location: [facility] OR;  Service: Open Heart Surgery;  Laterality: N/A;    Current Medications: Current Meds  Medication Sig   acetaminophen (TYLENOL) 500 MG tablet Take 2 tablets (1,000 mg total) by mouth every 6 (six) hours as needed.   amoxicillin (AMOXIL) 500 MG capsule TAKE 4 TABLETS BY MOUTH 1 HOUR BEFORE DENTAL PROCEDURE   aspirin EC 81 MG tablet Take 81 mg by mouth  in the morning. Swallow whole.   atorvastatin (LIPITOR) 40 MG tablet Take 1 tablet (40 mg total) by mouth in the morning.   BRILINTA 90 MG TABS tablet Take 90 mg by mouth 2 (two) times daily.   Efinaconazole 10 % SOLN Apply 1 drop topically daily.   hydrochlorothiazide (HYDRODIURIL) 12.5 MG tablet Take 1 tablet by mouth once daily   hydrocortisone cream 1 % Apply 1 application topically daily as needed for itching.   lisinopril (ZESTRIL) 5 MG tablet Take 2.5 mg by mouth in the morning.   metoprolol tartrate (LOPRESSOR) 50 MG tablet Take 1 tablet (50 mg total) by mouth once for 1 dose. Take one tablet 2 hours before your CT scan   neomycin-bacitracin-polymyxin (NEOSPORIN) ointment Apply 1 application topically daily as needed for wound care.   oxymetazoline (AFRIN) 0.05 % nasal spray Place 1 spray into both nostrils daily as needed for congestion.     Allergies:   Tetanus toxoid, adsorbed   Social History   Socioeconomic History   Marital status: Married    Spouse name: Not on file   Number of children: Not on file   Years of education: Not on file   Highest education level: Not on file  Occupational History   Occupation: retired  Tobacco Use   Smoking status: Former    Types: Cigarettes    Quit date: 10/20/2001    Years since quitting: 19.6   Smokeless tobacco: Never  Vaping Use   Vaping Use: Never used  Substance and Sexual Activity   Alcohol use: Yes    Alcohol/week: 1.0 - 2.0 standard drink    Types: 1 - 2 Glasses of wine per week   Drug use: No   Sexual activity: Not on file  Other Topics Concern   Not on file  Social History Narrative   Married 55 yrs (as of July 2022). Enjoys golf.     Social Determinants of Health   Financial Resource Strain: Not on file  Food Insecurity: Not on file  Transportation Needs: Not on file  Physical Activity: Not on file  Stress: Not on file  Social Connections: Not on file     Family History: The patient's family history  includes Prostate cancer in his father.  ROS:   Please see the history of present illness.  (+) Shortness of breath All other systems reviewed and are negative.  EKGs/Labs/Other Studies Reviewed:    The following studies were reviewed today:  INTRAVSC STENT CERV CAROTID W/ EMB-PROT 03/28/2021: FINDINGS:  The left common carotid arteriogram again demonstrates the left  external carotid origin to be narrowed by 50%. Its branches are  normally opacified.     The left internal carotid artery just distal to the bulb again  demonstrates a severe high-grade stenosis associated with a mild  ulceration and filling defect along the posterolateral wall. More  distally there is advancement of  contrast to the cranial skull base.     The petrous, cavernous and supraclinoid segments are widely patent  with mild arteriosclerotic changes involving the proximal cavernous  segment.     The left middle cerebral artery opacifies into the capillary and  venous phases with a delayed focal occlusion of a distal M4 branch  of the inferior division.     Transient antegrade filling is seen of the right anterior cerebral  A1 segment.   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   Carotid Duplex 03/26/2021: Summary:  Right Carotid: Velocities in the right ICA are consistent with a 1-39%  stenosis.   Left Carotid: Velocities in the left ICA are consistent with a 80-99%  stenosis. Significant progression of disease when compared to previous  study 12/16/2017.   Vertebrals:  Bilateral vertebral arteries demonstrate antegrade flow.  Subclavians: Normal flow hemodynamics were seen in bilateral subclavian arteries.   Echo 03/26/2021:  1. Left ventricular ejection fraction, by estimation, is 60 to 65%. The  left ventricle has normal function.  The left ventricle has no regional  wall motion abnormalities. There is mild left ventricular hypertrophy.  Left ventricular diastolic parameters  are consistent with Grade I diastolic dysfunction (impaired relaxation).   2. Right ventricular systolic function is normal. The right ventricular  size is normal.   3. Left atrial size was mildly dilated.   4. The mitral valve is normal in structure. No evidence of mitral valve  regurgitation. No evidence of mitral stenosis.   5. 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 Mean  gradient 12 mmHg with no significant regurgitation.   6. Aortic dilatation noted. There is mild dilatation of the ascending  aorta, measuring 41 mm.   7. The inferior vena cava is normal in size with greater than 50%  respiratory variability, suggesting right atrial pressure of 3 mmHg.   03/18/21 Exercise Tolerance Test:  Study Highlights Blood pressure demonstrated a normal response to exercise. ST segment depression of 5 mm was noted during stress in the II, III, aVF, V5 and V6 leads.   ETT with fair exercise tolerance (6:42); no chest pain but patient did complain of dyspnea; normal blood pressure response; 3 beats nonsustained ventricular tachycardia and frequent PVCs with exercise and 5 beats nonsustained ventricular tachycardia in recovery; 4 to 5 mm of ST depression in the inferior lateral leads and 3 mm ST elevation in AvR; abnormal ETT. Results discussed with Dr Anne Fu.  07/24/2020 Echo:  IMPRESSIONS   1. Frequent PVC's are noted. Left ventricular ejection fraction, by  estimation, is 65 to 70%. The left ventricle has normal function. The left  ventricle has no regional wall motion abnormalities. There is mild  eccentric left ventricular hypertrophy of  the basal-septal segment. Left ventricular diastolic parameters are  consistent with Grade I diastolic dysfunction (impaired relaxation).   2. The mitral valve is abnormal. Trivial mitral  valve regurgitation.   3. The aortic valve has been repaired/replaced. There is a 25 mm Edwards  Inspiris bioprosthetic valve noted in the aortic position. Aortic valve  regurgitation is not visualized. Aortic valve area, by VTI measures 2.19  cm. Aortic valve mean gradient  measures 10.0 mmHg. Aortic valve Vmax measures 2.38 m/s. DI is 0.45.   4. Aortic dilatation noted. There is mild dilatation of the aortic root,  measuring 40 mm. There  is mild to moderate dilatation of the ascending  aorta, measuring 44 mm.   5. Right ventricular systolic function is mildly reduced. The right  ventricular size is normal. There is normal pulmonary artery systolic  pressure.   6. The inferior vena cava is normal in size with greater than 50%  respiratory variability, suggesting right atrial pressure of 3 mmHg.   Comparison(s): Changes from prior study are noted. 01/25/2018: LVEF 60-65%,  bioprosthetic AVR - mean gradient 11 mmHg, aorta dilated to 42 mm.   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 This does not appear to impinge upon the left coronary artery.   Stable thoracic aortic aneurysm with maximal diameter of 4.0 x 4.0 cm.  EKG from 03/26/2021 showed heart rate of 68 bpm with nonspecific ST-T wave depression  Recent Labs: 03/25/2021: ALT 24 03/30/2021: BUN 11; Creatinine, Ser 0.96; Hemoglobin 12.2; Platelets 195; Potassium 3.8; Sodium 139   Recent Lipid Panel    Component Value Date/Time   CHOL 154 03/26/2021 0345   TRIG 118 03/26/2021 0345   HDL 39 (L) 03/26/2021 0345   CHOLHDL 3.9 03/26/2021 0345   VLDL 24 03/26/2021 0345   LDLCALC 91 03/26/2021 0345     Risk Assessment/Calculations:          Physical Exam:    VS:  BP 126/70 (BP Location: Left Arm, Patient Position: Sitting, Cuff Size: Normal)   Pulse 68   Ht 5\' 11"  (1.803 m)   Wt 193 lb (87.5 kg)   SpO2 95%   BMI 26.92 kg/m     Wt Readings from Last 3  Encounters:  06/06/21 193 lb (87.5 kg)  05/08/21 188 lb (85.3 kg)  03/25/21 195 lb (88.5 kg)    GEN: Well nourished, well developed in no acute distress HEENT: Normal NECK: No JVD; No carotid bruits LYMPHATICS: No lymphadenopathy CARDIAC: RRR, soft systolic murmurs, no rubs, gallops RESPIRATORY:  Clear to auscultation without rales, wheezing or rhonchi  ABDOMEN: Soft, non-tender, non-distended MUSCULOSKELETAL:  No edema; No deformity  SKIN: Warm and dry NEUROLOGIC:  Alert and oriented x 3 PSYCHIATRIC:  Normal affect    ASSESSMENT:    1. Pre-procedure lab exam   2. Coronary artery disease involving native coronary artery of native heart without angina pectoris   3. Dilated aortic root (HCC)   4. Bicuspid aortic valve   5. 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 Pure hypercholesterolemia   7. Abnormal cardiovascular stress test   8. Dyspnea on exertion   9. S/P aortic valve replacement with bioprosthetic valve     PLAN:    In order of problems listed above: CAD (coronary artery disease) Abnormal exercise treadmill test, ST depressions noted.  aVR elevation.  Prior cardiac catheterization in 2019 prior to his aortic valve replacement demonstrated mild CAD see above.  Since he has had some shortness of breath which may be an anginal equivalent and recent stroke, we will go ahead and check a coronary CT scan to exclude worsening coronary artery disease.  Dilated aortic root (HCC) This will be assessed as well during coronary CT scan.  Bicuspid aortic valve 2019, He has had aortic valve replacement.    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  Post stroke.  On aspirin and Brilinta.  Pure hypercholesterolemia Recently had lab work done yesterday in [place].  LDL goal is less than  70.  Last check in July it was 91.  He is on atorvastatin 40 mg.  If necessary, increase to  80.  Abnormal cardiovascular stress test His prior stress test was personally reviewed and demonstrates marked ST segment depression with aVR elevation.  He did recently have a cardiac catheterization in 2019 that showed minor nonobstructive coronary artery disease.  Could there be a progression of disease.  Previously we were going to proceed with diagnostic heart catheterization however he ended up having a stroke which canceled this procedure obviously.  He did have a carotid stent placed during stroke.  I would like to pursue this in a noninvasive manner, coronary CT scan with possible FFR analysis.  He is amenable to this.  This will also allow [place] to measure his aorta which was 44 mm on ascending root.  Prior bicuspid valve was replaced in 2019.  S/P aortic valve replacement with bioprosthetic valve 25 mm bioprosthetic valve placed in 2019.  Prior gradient 11 mmHg.  Functioning well.  Soft murmur heard on exam.  Dental prophylaxis.  Aspirin.  Follow-up: 1 year.  Medication Adjustments/Labs and Tests Ordered: Current medicines are reviewed at length with the patient today.  Concerns regarding medicines are outlined above.   Orders Placed This Encounter  Procedures   CT CORONARY MORPH W/CTA COR W/SCORE W/CA W/CM &/OR WO/CM   Basic metabolic panel    Meds ordered this encounter  Medications   metoprolol tartrate (LOPRESSOR) 50 MG tablet    Sig: Take 1 tablet (50 mg total) by mouth once for 1 dose. Take one tablet 2 hours before your CT scan    Dispense:  1 tablet    Refill:  0    Patient Instructions  Medication Instructions:  The current medical regimen is effective;  continue present plan and medications.  *If you need a refill on your cardiac medications before your next appointment, please call your pharmacy*  Lab Work: Will need blood work before your CT scan. (BMP)  If you have labs (blood work) drawn today and your tests are completely normal, you will receive your  results only by: MyChart Message (if you have MyChart) OR A paper copy in the mail If you have any lab test that is abnormal or we need to change your treatment, we will call you to review the results.  Testing/Procedures:   Your cardiac CT will be scheduled at    [facility] [address] [place], [place] [address] [phone]  Please arrive at the [facility] main entrance (entrance A) of [facility] 30 minutes prior to test start time. Proceed to the [facility] Radiology Department (first floor) to check-in and test prep.  Please follow these instructions carefully (unless otherwise directed):  Hold all erectile dysfunction medications at least 3 days (72 hrs) prior to test.  On the Night Before the Test: Be sure to Drink plenty of water. Do not consume any caffeinated/decaffeinated beverages or chocolate 12 hours prior to your test. Do not take any antihistamines 12 hours prior to your test.  On the Day of the Test: Drink plenty of water until 1 hour prior to the test. Do not eat any food 4 hours prior to the test. You may take your regular medications prior to the test.  Take metoprolol (Lopressor) two hours prior to test. HOLD Furosemide/Hydrochlorothiazide morning of the test.  After the Test: Drink plenty of water. After receiving IV contrast, you may experience a mild flushed  feeling. This is normal. On occasion, you may experience a mild rash up to 24 hours after the test. This is not dangerous. If this occurs, you can take Benadryl 25 mg and increase your fluid intake. If you experience trouble breathing, this can be serious. If it is severe call 911 IMMEDIATELY. If it is mild, please call our office.  Please allow 2-4 weeks for scheduling of routine cardiac CTs. Some insurance companies require a pre-authorization which may delay scheduling of this test.   For non-scheduling related questions, please contact the cardiac imaging nurse  navigator should you have any questions/concerns: Rockwell Alexandria, Cardiac Imaging Nurse Navigator Larey Brick, Cardiac Imaging Nurse Navigator [facility] Direct Office Dial: [phone]   For scheduling needs, including cancellations and rescheduling, please call [place], [phone].  Follow-Up: At [facility], you and your health needs are our priority.  As part of our continuing mission to provide you with exceptional heart care, we have created designated Provider Care Teams.  These Care Teams include your primary Cardiologist (physician) and Advanced Practice Providers (APPs -  Physician Assistants and Nurse Practitioners) who all work together to provide you with the care you need, when you need it.  We recommend signing up for the patient portal called "MyChart".  Sign up information is provided on this After Visit Summary.  MyChart is used to connect with patients for Virtual Visits (Telemedicine).  Patients are able to view lab/test results, encounter notes, upcoming appointments, etc.  Non-urgent messages can be sent to your provider as well.   To learn more about what you can do with MyChart, go to [email or web address].    Your next appointment:   6 month(s)  The format for your next appointment:   In Person  Provider:   Donato Schultz, MD  Thank you for choosing [facility]!!     I,Mathew Stumpf,acting as a scribe for Donato Schultz, MD.,have documented all relevant documentation on the behalf of Donato Schultz, MD,as directed by  Donato Schultz, MD while in the presence of Donato Schultz, MD.  I, Donato Schultz, MD, have reviewed all documentation for this visit. The documentation on 06/08/21 for the exam, diagnosis, procedures, and orders are all accurate and complete.   Signed, Donato Schultz, MD  06/08/2021 7:57 AM    [facility]

## 2021-06-06 NOTE — Assessment & Plan Note (Signed)
Recently had lab work done yesterday in [place].  LDL goal is less than 70.  Last check in July it was 91.  He is on atorvastatin 40 mg.  If necessary, increase to 80.

## 2021-06-06 NOTE — Patient Instructions (Addendum)
Medication Instructions:  The current medical regimen is effective;  continue present plan and medications.  *If you need a refill on your cardiac medications before your next appointment, please call your pharmacy*  Lab Work: Will need blood work before your CT scan. (BMP)  If you have labs (blood work) drawn today and your tests are completely normal, you will receive your results only by: MyChart Message (if you have MyChart) OR A paper copy in the mail If you have any lab test that is abnormal or we need to change your treatment, we will call you to review the results.  Testing/Procedures:   Your cardiac CT will be scheduled at    [facility] [address] [place], [place] [address] [phone]  Please arrive at the [facility] main entrance (entrance A) of [facility] 30 minutes prior to test start time. Proceed to the [facility] Radiology Department (first floor) to check-in and test prep.  Please follow these instructions carefully (unless otherwise directed):  Hold all erectile dysfunction medications at least 3 days (72 hrs) prior to test.  On the Night Before the Test: Be sure to Drink plenty of water. Do not consume any caffeinated/decaffeinated beverages or chocolate 12 hours prior to your test. Do not take any antihistamines 12 hours prior to your test.  On the Day of the Test: Drink plenty of water until 1 hour prior to the test. Do not eat any food 4 hours prior to the test. You may take your regular medications prior to the test.  Take metoprolol (Lopressor) two hours prior to test. HOLD Furosemide/Hydrochlorothiazide morning of the test.  After the Test: Drink plenty of water. After receiving IV contrast, you may experience a mild flushed feeling. This is normal. On occasion, you may experience a mild rash up to 24 hours after the test. This is not dangerous. If this occurs, you can take Benadryl 25 mg and increase your fluid  intake. If you experience trouble breathing, this can be serious. If it is severe call 911 IMMEDIATELY. If it is mild, please call our office.  Please allow 2-4 weeks for scheduling of routine cardiac CTs. Some insurance companies require a pre-authorization which may delay scheduling of this test.   For non-scheduling related questions, please contact the cardiac imaging nurse navigator should you have any questions/concerns: Rockwell Alexandria, Cardiac Imaging Nurse Navigator Larey Brick, Cardiac Imaging Nurse Navigator [facility] Direct Office Dial: [phone]   For scheduling needs, including cancellations and rescheduling, please call [place], [phone].  Follow-Up: At [facility], you and your health needs are our priority.  As part of our continuing mission to provide you with exceptional heart care, we have created designated Provider Care Teams.  These Care Teams include your primary Cardiologist (physician) and Advanced Practice Providers (APPs -  Physician Assistants and Nurse Practitioners) who all work together to provide you with the care you need, when you need it.  We recommend signing up for the patient portal called "MyChart".  Sign up information is provided on this After Visit Summary.  MyChart is used to connect with patients for Virtual Visits (Telemedicine).  Patients are able to view lab/test results, encounter notes, upcoming appointments, etc.  Non-urgent messages can be sent to your provider as well.   To learn more about what you can do with MyChart, go to [email or web address].    Your next appointment:   6 month(s)  The format for your next  appointment:   In Person  Provider:   Donato Schultz, MD  Thank you for choosing [facility]!!

## 2021-06-06 NOTE — Assessment & Plan Note (Addendum)
2019, He has had aortic valve replacement.

## 2021-06-08 DIAGNOSIS — R9439 Abnormal result of other cardiovascular function study: Secondary | ICD-10-CM | POA: Insufficient documentation

## 2021-06-08 NOTE — Assessment & Plan Note (Signed)
His prior stress test was personally reviewed and demonstrates marked ST segment depression with aVR elevation.  He did recently have a cardiac catheterization in 2019 that showed minor nonobstructive coronary artery disease.  Could there be a progression of disease.  Previously we were going to proceed with diagnostic heart catheterization however he ended up having a stroke which canceled this procedure obviously.  He did have a carotid stent placed during stroke.  I would like to pursue this in a noninvasive manner, coronary CT scan with possible FFR analysis.  He is amenable to this.  This will also allow [place] to measure his aorta which was 44 mm on ascending root.  Prior bicuspid valve was replaced in 2019.

## 2021-06-08 NOTE — Assessment & Plan Note (Signed)
25 mm bioprosthetic valve placed in 2019.  Prior gradient 11 mmHg.  Functioning well.  Soft murmur heard on exam.  Dental prophylaxis.  Aspirin.

## 2021-06-10 ENCOUNTER — Other Ambulatory Visit: Payer: Self-pay

## 2021-06-10 ENCOUNTER — Other Ambulatory Visit: Payer: Medicare HMO | Admitting: *Deleted

## 2021-06-10 DIAGNOSIS — R9439 Abnormal result of other cardiovascular function study: Secondary | ICD-10-CM

## 2021-06-10 DIAGNOSIS — Z01812 Encounter for preprocedural laboratory examination: Secondary | ICD-10-CM

## 2021-06-10 DIAGNOSIS — R0609 Other forms of dyspnea: Secondary | ICD-10-CM | POA: Diagnosis not present

## 2021-06-10 LAB — BASIC METABOLIC PANEL
BUN/Creatinine Ratio: 22 (ref 10–24)
BUN: 18 mg/dL (ref 8–27)
CO2: 26 mmol/L (ref 20–29)
Calcium: 10 mg/dL (ref 8.6–10.2)
Chloride: 104 mmol/L (ref 96–106)
Creatinine, Ser: 0.83 mg/dL (ref 0.76–1.27)
Glucose: 144 mg/dL — ABNORMAL HIGH (ref 70–99)
Potassium: 4.4 mmol/L (ref 3.5–5.2)
Sodium: 139 mmol/L (ref 134–144)
eGFR: 91 mL/min/{1.73_m2} (ref >59)

## 2021-06-12 ENCOUNTER — Telehealth (HOSPITAL_COMMUNITY): Payer: Self-pay | Admitting: Emergency Medicine

## 2021-06-12 NOTE — Telephone Encounter (Signed)
Reaching out to patient to offer assistance regarding upcoming cardiac imaging study; pt verbalizes understanding of appt date/time, parking situation and where to check in, pre-test NPO status and medications ordered, and verified current allergies; name and call back number provided for further questions should they arise Rockwell Alexandria RN Navigator Cardiac Imaging [facility] [phone] office [phone] cell  50mg  metoprolol 2 hr prior to scan Denies iv issues

## 2021-06-16 ENCOUNTER — Other Ambulatory Visit: Payer: Self-pay | Admitting: Cardiology

## 2021-06-16 ENCOUNTER — Encounter (HOSPITAL_COMMUNITY): Payer: Self-pay

## 2021-06-16 ENCOUNTER — Ambulatory Visit (HOSPITAL_COMMUNITY)
Admission: RE | Admit: 2021-06-16 | Discharge: 2021-06-16 | Disposition: A | Discharge status: Home / Self Care | Payer: Medicare HMO | Source: Ambulatory Visit | Attending: Cardiology | Admitting: Cardiology

## 2021-06-16 ENCOUNTER — Other Ambulatory Visit: Payer: Self-pay

## 2021-06-16 DIAGNOSIS — I2510 Atherosclerotic heart disease of native coronary artery without angina pectoris: Secondary | ICD-10-CM | POA: Insufficient documentation

## 2021-06-16 DIAGNOSIS — R931 Abnormal findings on diagnostic imaging of heart and coronary circulation: Secondary | ICD-10-CM

## 2021-06-16 DIAGNOSIS — R9439 Abnormal result of other cardiovascular function study: Secondary | ICD-10-CM | POA: Insufficient documentation

## 2021-06-16 DIAGNOSIS — R0609 Other forms of dyspnea: Secondary | ICD-10-CM | POA: Insufficient documentation

## 2021-06-16 DIAGNOSIS — I700 Atherosclerosis of aorta: Secondary | ICD-10-CM | POA: Insufficient documentation

## 2021-06-16 IMAGING — CT CT HEART MORP W/ CTA COR W/ SCORE W/ CA W/CM &/OR W/O CM
4 of 7 series · 8 of 20 positions shown, 9 images · IV contrast (APPLIED)
Comparison: None.
COMPARISON: None.

Addendum:
EXAM:
OVER-READ INTERPRETATION  CT CHEST

The following report is an over-read performed by radiologist Dr.
DEEQA RAYAAN [REDACTED] on [DATE]. This
over-read does not include interpretation of cardiac or coronary
anatomy or pathology. The coronary calcium score/coronary CTA
interpretation by the cardiologist is attached.
CLINICAL DATA: Chest pain
Cardiac/Coronary CTA
TECHNIQUE: A non-contrast, gated CT scan was obtained with axial slices of 3 mm
through the heart for calcium scoring. Calcium scoring was performed
using the Agatston method. A 120 kV prospective, gated, contrast
cardiac scan was obtained. Gantry rotation speed was 250 msecs and
collimation was 0.6 mm. Two sublingual nitroglycerin tablets (0.8
mg) were given. The 3D data set was reconstructed in 5% intervals of
the 35-75% of the R-R cycle. Diastolic phases were analyzed on a
dedicated workstation using MPR, MIP, and VRT modes. The patient
received 95 cc of contrast.

[Series 6: ts diast sharp · axial · 0.39mm/px · z∈[+1369,+1408]mm · 2 of 296 slices shown]
[im 99/296  lung]
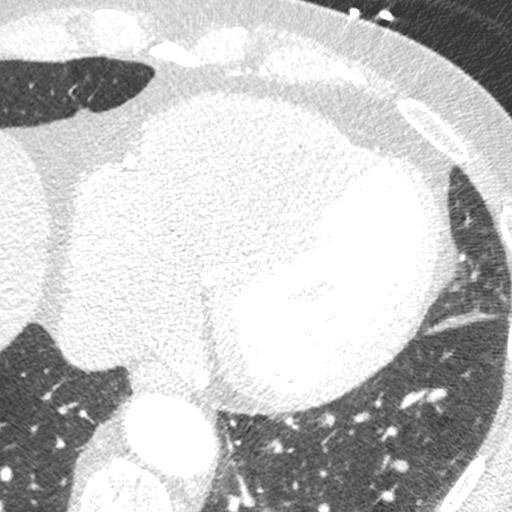
[im 197/296  lung]
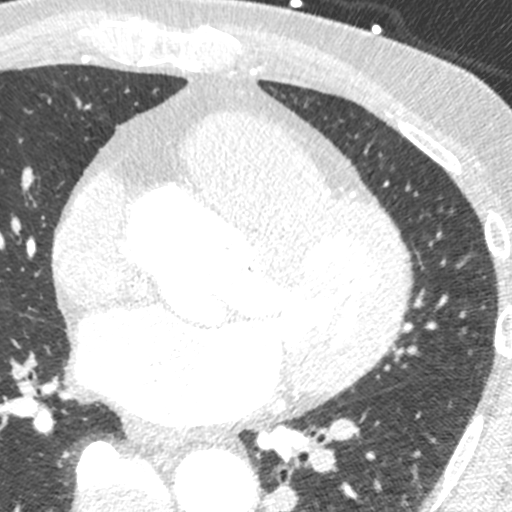

[Series 7: best diast · axial · 0.39mm/px · z∈[+1369,+1408]mm · 2 of 296 slices shown, 3 images]
[im 99/296  vessel]
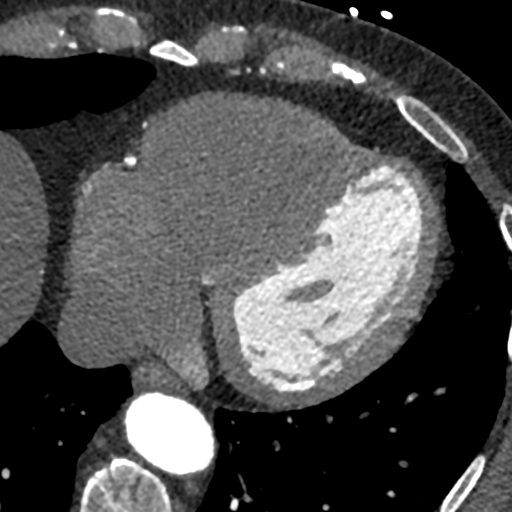
[im 99/296  lung]
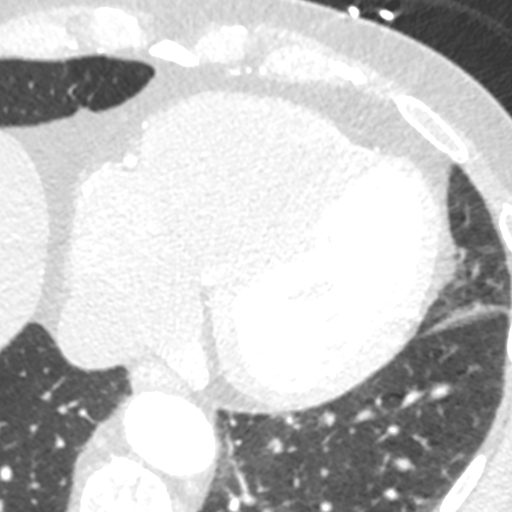
[im 197/296  vessel]
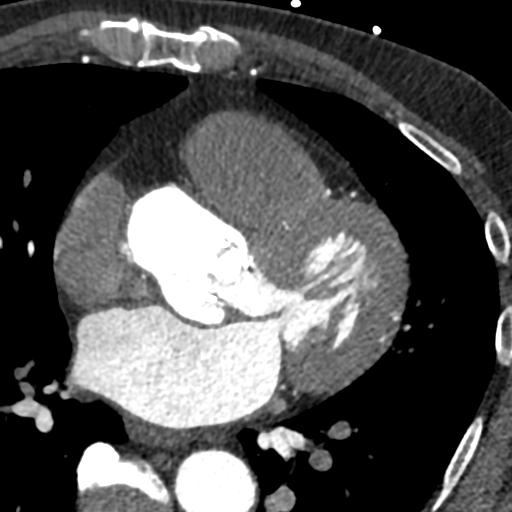

[Series 8: best syst · axial · 0.39mm/px · z∈[+1369,+1408]mm · 2 of 296 slices shown]
[im 99/296  vessel]
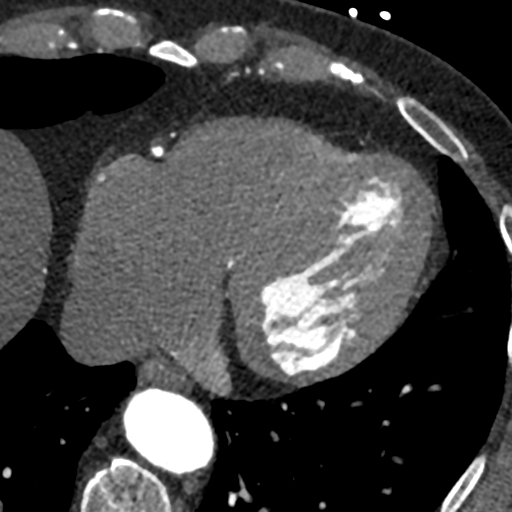
[im 197/296  vessel]
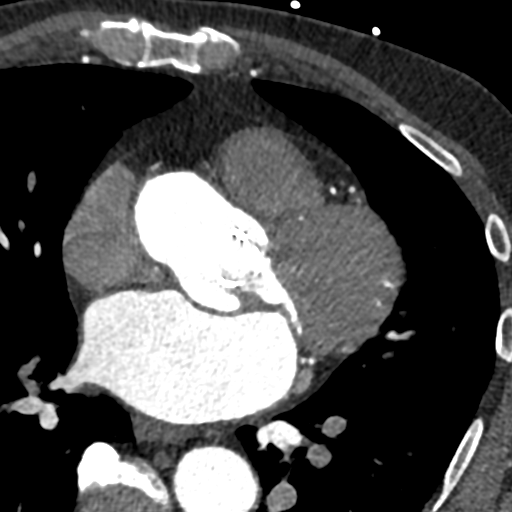

[Series 9: ts syst sharp · axial · 0.39mm/px · z∈[+1369,+1408]mm · 2 of 296 slices shown]
[im 99/296  lung]
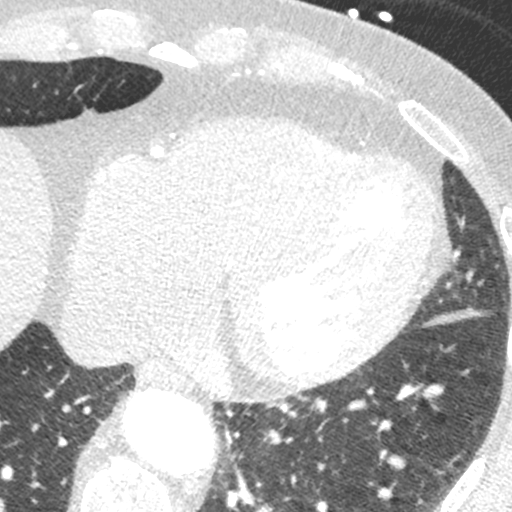
[im 197/296  lung]
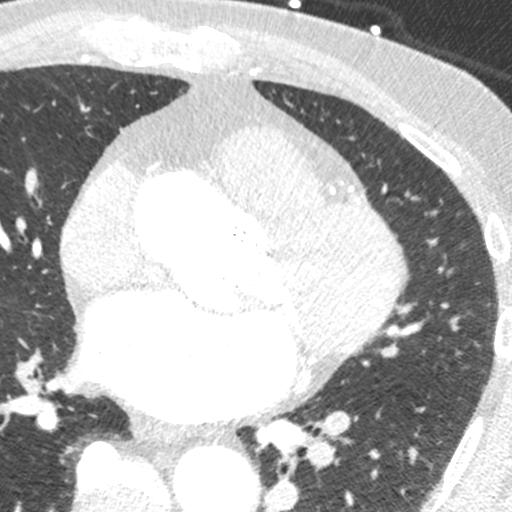

[8 of 20 positions shown; findings below may reference images not displayed]

FINDINGS: Atherosclerotic calcifications in the thoracic aorta. Within the
visualized portions of the thorax there are no suspicious appearing
pulmonary nodules or masses, there is no acute consolidative
airspace disease, no pleural effusions, no pneumothorax and no
lymphadenopathy. Visualized portions of the upper abdomen are
unremarkable. There are no aggressive appearing lytic or blastic
lesions noted in the visualized portions of the skeleton. Median
sternotomy wires.
IMPRESSION: 1.  Aortic Atherosclerosis ([GV]-[GV]).
FINDINGS: Image quality: Excellent.

Noise artifact is: Limited.

Coronary Arteries:  Normal coronary origin.  Right dominance.

Left main: The left main is a large caliber vessel with a normal
take off from the left coronary cusp that bifurcates to form a left
anterior descending artery and a left circumflex artery. trifurcates
into a LAD, LCX, and ramus intermedius. There is no plaque or
stenosis.

Left anterior descending artery: The LAD gives off 2 patent diagonal
branches. There is moderate and possibly severe stenosis of the
proximal LAD with associated stenosis of 50-69% and [DATE]%.
There is significant blooming artifact which may over estimate
degree of stenosis. There is mild calcified plaque in the mid LAD
with associated stenosis of 25-49%.

Ramus intermedius: Patent with no evidence of plaque or stenosis.

Left circumflex artery: The LCX is non-dominant with possible high
grade non calcified plaque in the proximal vessel with 70-99%
stenosis. The LCX gives off 3 obtuse marginal branches. The first
OM1 is now well visualized.

Right coronary artery: The RCA is dominant with normal take off from
the right coronary cusp. The RCA terminates as a PDA and right
posterolateral branch. There is moderate calcified plaque in the
proximal RCA with associated stenosis of 50-69%. There is mild
calcified plaque in the proximal to mid RCA with associated stenosis
of 25-49%.

Right Atrium: Right atrial size is within normal limits.

Right Ventricle: The right ventricular cavity is within normal
limits.

Left Atrium: Left atrial size is normal in size with no left atrial
appendage filling defect.

Left Ventricle: The ventricular cavity size is within normal limits.
There are no stigmata of prior infarction. There is no abnormal
filling defect.

Pulmonary arteries: Normal in size without proximal filling defect.

Pulmonary veins: Normal pulmonary venous drainage.

Pericardium: Normal thickness with no significant effusion or
calcium present.

Cardiac valves: The mitral valve is normal structure with severe
mitral annular calcifications. The Aortic valve has been replaced
with a 25mm [HOSPITAL] bioprosthetic valve.

Aorta: Normal caliber. Scattered calcifications of the ascending and
descending aorta.

Extra-cardiac findings: See attached radiology report for
non-cardiac structures.
IMPRESSION: 1. Coronary calcium score of 300. This was 54th percentile for age-,
sex, and race-matched controls.

2. Normal coronary origin with right dominance.

3. Moderate to severe 2 vessel atherosclerosis of the LAD and LCx.
CAD RADS 3.

RECOMMENDATIONS:
This study has been submitted for FFR flow analysis.

Consider symptom guided anti-ischemic and preventive pharmacotherapy
as well as risk factor modification per guideline-directed care.

1. CAD-RADS 0: No evidence of CAD (0%). Consider non-atherosclerotic
causes of chest pain.

2. CAD-RADS 1: Minimal non-obstructive CAD (0-24%). Consider
non-atherosclerotic causes of chest pain. Consider preventive
therapy and risk factor modification.

3. CAD-RADS 2: Mild non-obstructive CAD (25-49%). Consider
non-atherosclerotic causes of chest pain. Consider preventive
therapy and risk factor modification.

4. CAD-RADS 3: Moderate stenosis. Consider symptom-guided
anti-ischemic pharmacotherapy as well as risk factor modification
per guideline directed care. Additional analysis with CT FFR will be
submitted.

5. CAD-RADS 4: Severe stenosis. (70-99% or > 50% left main). Cardiac
catheterization or CT FFR is recommended. Consider symptom-guided
anti-ischemic pharmacotherapy as well as risk factor modification
per guideline directed care. Invasive coronary angiography
recommended with revascularization per published guideline
statements.

6. CAD-RADS 5: Total coronary occlusion (100%). Consider cardiac
catheterization or viability assessment. Consider symptom-guided
anti-ischemic pharmacotherapy as well as risk factor modification
per guideline directed care.

7. CAD-RADS N: Non-diagnostic study. Obstructive CAD can't be
excluded. Alternative evaluation is recommended.

*** End of Addendum ***
EXAM:
OVER-READ INTERPRETATION  CT CHEST

The following report is an over-read performed by radiologist Dr.
DEEQA RAYAAN [REDACTED] on [DATE]. This
over-read does not include interpretation of cardiac or coronary
anatomy or pathology. The coronary calcium score/coronary CTA
interpretation by the cardiologist is attached.
FINDINGS: Atherosclerotic calcifications in the thoracic aorta. Within the
visualized portions of the thorax there are no suspicious appearing
pulmonary nodules or masses, there is no acute consolidative
airspace disease, no pleural effusions, no pneumothorax and no
lymphadenopathy. Visualized portions of the upper abdomen are
unremarkable. There are no aggressive appearing lytic or blastic
lesions noted in the visualized portions of the skeleton. Median
sternotomy wires.
IMPRESSION: 1.  Aortic Atherosclerosis ([GV]-[GV]).

## 2021-06-16 MED ORDER — NITROGLYCERIN 0.4 MG SL SUBL
0.8000 mg | SUBLINGUAL_TABLET | Freq: Once | SUBLINGUAL | Status: AC
  Administered 2021-06-16: 0.8 mg via SUBLINGUAL

## 2021-06-16 MED ORDER — NITROGLYCERIN 0.4 MG SL SUBL
SUBLINGUAL_TABLET | SUBLINGUAL | Status: AC
  Filled 2021-06-16: qty 2

## 2021-06-16 MED ORDER — IOHEXOL 350 MG/ML SOLN
95.0000 mL | Freq: Once | INTRAVENOUS | Status: AC | PRN
  Administered 2021-06-16: 95 mL via INTRAVENOUS

## 2021-06-17 DIAGNOSIS — R931 Abnormal findings on diagnostic imaging of heart and coronary circulation: Secondary | ICD-10-CM | POA: Diagnosis not present

## 2021-06-17 DIAGNOSIS — I2510 Atherosclerotic heart disease of native coronary artery without angina pectoris: Secondary | ICD-10-CM | POA: Diagnosis not present

## 2021-06-17 DIAGNOSIS — I8312 Varicose veins of left lower extremity with inflammation: Secondary | ICD-10-CM | POA: Diagnosis not present

## 2021-06-19 ENCOUNTER — Other Ambulatory Visit: Payer: Self-pay

## 2021-06-19 ENCOUNTER — Ambulatory Visit: Payer: Medicare HMO | Admitting: Cardiology

## 2021-06-19 ENCOUNTER — Encounter: Payer: Self-pay | Admitting: Cardiology

## 2021-06-19 DIAGNOSIS — I2510 Atherosclerotic heart disease of native coronary artery without angina pectoris: Secondary | ICD-10-CM

## 2021-06-19 DIAGNOSIS — I77810 Thoracic aortic ectasia: Secondary | ICD-10-CM

## 2021-06-19 DIAGNOSIS — R0602 Shortness of breath: Secondary | ICD-10-CM

## 2021-06-19 DIAGNOSIS — Z953 Presence of xenogenic heart valve: Secondary | ICD-10-CM

## 2021-06-19 MED ORDER — CLOPIDOGREL BISULFATE 75 MG PO TABS: 75.0000 mg | ORAL_TABLET | Freq: Every day | ORAL | 3 refills | Status: DC

## 2021-06-19 NOTE — Assessment & Plan Note (Signed)
We will go ahead and transition him over from Brilinta over to Plavix.  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  Occasionally, Brilinta as a side effect will be associated with shortness of breath.  Lets see if this helps.

## 2021-06-19 NOTE — Progress Notes (Signed)
Cardiology Office Note:    Date:  06/19/2021   ID:  Stephen Mullins, DOB 08/02/1946, MRN [id number]  PCP:  Soundra Pilon, FNP   [facility] Providers Cardiologist:  Donato Schultz, MD     Referring MD: Soundra Pilon, FNP    History of Present Illness:    Stephen Mullins is a 75 y.o. male here for follow-up to discuss recent CT results.  At his last appointment we discussed abnormal GXT per MS. his treadmill showed marked ST segment depression diffusely with aVR elevation which is suggestive of multivessel coronary artery disease.  Interestingly in 2019 prior to his aortic valve replacement valve, he had mild nonobstructive disease noted on cardiac cath.  In the past he underwent an aortic valve replacement in April 2019 with a number 25 mm bioprosthetic valve.  LV function normal.  Dilated aorta.  Doing well.   Previous EKG showed sinus rhythm with ventricular bigeminy heart rate 52 but effective heart rate likely in the 30s secondary to PVCs.  Had no dizziness no syncope.  Walking 1 to 2 miles a day.  He was doing reasonably well. After his stress test on 03/18/21 he had no adverse effect. He never had negative symptoms when working outside or walking around his home. Otherwise he was tolerating his medication well.   Today: He is accompanied by his wife. Overall he is feeling good aside from some shortness of breath with exercise. Since his stent placement 2.5 months ago this seems to have worsened. He also believes his shortness of breath is due to taking Brilinta.  For exercise he regularly walks up to a couple miles without significant symptoms. However, he becomes short of breath while walking up inclines.  Occasionally he feels palpitations. He notes that his heart rate is typically 62-64 bpm, but increases to 74-76 with exertion.  He denies any chest pain. No lightheadedness, headaches, syncope, orthopnea, or PND. Also has no lower extremity edema.   Past Medical  History:  Diagnosis Date   Bicuspid aortic valve    with aortic stenosis and mild insufficiency, mild to moderate aortic root dilitation   Borderline hyperlipidemia    Diabetes mellitus without complication (HCC)    Elevated fasting glucose    Elevated PSA    due to prostatitis   GERD (gastroesophageal reflux disease)    Heart murmur    Hypertension    Nail fungus    Thoracic ascending aortic aneurysm     Past Surgical History:  Procedure Laterality Date   AORTIC VALVE REPLACEMENT N/A 12/20/2017   Procedure: AORTIC VALVE REPLACEMENT (AVR);  Surgeon: [name], [name], MD;  Location: [facility] OR;  Service: Open Heart Surgery;  Laterality: N/A;   CATARACT EXTRACTION, BILATERAL     IR ANGIO INTRA EXTRACRAN SEL COM CAROTID INNOMINATE BILAT MOD SED  03/26/2021   IR ANGIO INTRA EXTRACRAN SEL COM CAROTID INNOMINATE UNI L MOD SED  04/03/2021   IR ANGIO VERTEBRAL SEL SUBCLAVIAN INNOMINATE BILAT MOD SED  03/26/2021   IR CT HEAD LTD  03/28/2021   IR INTRAVSC STENT CERV CAROTID W/EMB-PROT MOD SED INCL ANGIO  03/28/2021   IR RADIOLOGIST EVAL & MGMT  04/15/2021   IR US GUIDE VASC ACCESS RIGHT  03/28/2021   RADIOLOGY WITH ANESTHESIA N/A 03/28/2021   Procedure: STENT PLACMENT;  Surgeon: Julieanne Cotton, MD;  Location: [facility] OR;  Service: Radiology;  Laterality: N/A;   RIGHT/LEFT HEART CATH AND CORONARY ANGIOGRAPHY N/A 11/09/2017   Procedure: RIGHT/LEFT HEART  CATH AND CORONARY ANGIOGRAPHY;  Surgeon: [name], MD;  Location: [facility] INVASIVE CV LAB;  Service: Cardiovascular;  Laterality: N/A;   TEE WITHOUT CARDIOVERSION N/A 12/20/2017   Procedure: TRANSESOPHAGEAL ECHOCARDIOGRAM (TEE);  Surgeon: [name], [name], MD;  Location: [facility] OR;  Service: Open Heart Surgery;  Laterality: N/A;    Current Medications: Current Meds  Medication Sig   acetaminophen (TYLENOL) 500 MG tablet Take 2 tablets (1,000 mg total) by mouth every 6 (six) hours as needed.   amoxicillin (AMOXIL) 500 MG capsule TAKE 4 TABLETS BY MOUTH  1 HOUR BEFORE DENTAL PROCEDURE   aspirin EC 81 MG tablet Take 81 mg by mouth in the morning. Swallow whole.   atorvastatin (LIPITOR) 40 MG tablet Take 1 tablet (40 mg total) by mouth in the morning.   clopidogrel (PLAVIX) 75 MG tablet Take 1 tablet (75 mg total) by mouth daily.   Efinaconazole 10 % SOLN Apply 1 drop topically daily.   hydrochlorothiazide (HYDRODIURIL) 12.5 MG tablet Take 1 tablet by mouth once daily   hydrocortisone cream 1 % Apply 1 application topically daily as needed for itching.   lisinopril (ZESTRIL) 5 MG tablet Take 2.5 mg by mouth in the morning.   metoprolol tartrate (LOPRESSOR) 50 MG tablet Take 1 tablet (50 mg total) by mouth once for 1 dose. Take one tablet 2 hours before your CT scan   neomycin-bacitracin-polymyxin (NEOSPORIN) ointment Apply 1 application topically daily as needed for wound care.   oxymetazoline (AFRIN) 0.05 % nasal spray Place 1 spray into both nostrils daily as needed for congestion.   [DISCONTINUED] BRILINTA 90 MG TABS tablet Take 90 mg by mouth 2 (two) times daily.     Allergies:   Tetanus toxoid, adsorbed   Social History   Socioeconomic History   Marital status: Married    Spouse name: Not on file   Number of children: Not on file   Years of education: Not on file   Highest education level: Not on file  Occupational History   Occupation: retired  Tobacco Use   Smoking status: Former    Types: Cigarettes    Quit date: 10/20/2001    Years since quitting: 19.6   Smokeless tobacco: Never  Vaping Use   Vaping Use: Never used  Substance and Sexual Activity   Alcohol use: Yes    Alcohol/week: 1.0 - 2.0 standard drink    Types: 1 - 2 Glasses of wine per week   Drug use: No   Sexual activity: Not on file  Other Topics Concern   Not on file  Social History Narrative   Married 55 yrs (as of July 2022). Enjoys golf.     Social Determinants of Health   Financial Resource Strain: Not on file  Food Insecurity: Not on file   Transportation Needs: Not on file  Physical Activity: Not on file  Stress: Not on file  Social Connections: Not on file     Family History: The patient's family history includes Prostate cancer in his father.  ROS:   Please see the history of present illness.  (+) Exertional shortness of breath (+) Palpitations All other systems reviewed and are negative.  EKGs/Labs/Other Studies Reviewed:    The following studies were reviewed today:  FFRCT Analysis 06/16/2021: FINDINGS: FFRct analysis was performed on the original cardiac CT angiogram dataset. Diagrammatic representation of the FFRct analysis is provided in a separate PDF document in PACS. This dictation was created using the PDF document and an interactive  3D model of the results. 3D model is not available in the EMR/PACS. Normal FFR range is >0.80.   Left Main: findings LM FFR = 0.98.   LAD: No significant stenosis. Proximal FFR =0.91, Mid FFR = 0.85. The dista LAD could not be modeled.   LCX: Possible significant stenosis in the mid to distal LCx. Proximal FFR = 0.98, Mid FFR =0.62, Distal FFR = 0.55. Normal flow in large OM1.   RCA: No significant stenosis. Proximal FFR =0.99, Mid FFR = 0.93, Distal FFR = 0.90. Prox PDA FFR = 0.90, distal PDA FFR = 0.76.   IMPRESSION: 1. Coronary CTA FFR flow analysis demonstrates possible flow limiting lesion in the mid LCx. Proximal LCx FFR 0.90>>Mid LCx FFR 0.62.   2. Recommend aggressive antianginal therapy and consideration of cardiac catheterization.  Cardiac CT w/Calcium Score 06/16/2021: FINDINGS: Image quality: Excellent.   Noise artifact is: Limited.   Coronary Arteries:  Normal coronary origin.  Right dominance.   Left main: The left main is a large caliber vessel with a normal take off from the left coronary cusp that bifurcates to form a left anterior descending artery and a left circumflex artery. trifurcates into a LAD, LCX, and ramus intermedius.  There is no plaque or stenosis.   Left anterior descending artery: The LAD gives off 2 patent diagonal branches. There is moderate and possibly severe stenosis of the proximal LAD with associated stenosis of 50-69% and may be >70%. There is significant blooming artifact which may over estimate degree of stenosis. There is mild calcified plaque in the mid LAD with associated stenosis of 25-49%.   Ramus intermedius: Patent with no evidence of plaque or stenosis.   Left circumflex artery: The LCX is non-dominant with possible high grade non calcified plaque in the proximal vessel with 70-99% stenosis. The LCX gives off 3 obtuse marginal branches. The first OM1 is now well visualized.   Right coronary artery: The RCA is dominant with normal take off from the right coronary cusp. The RCA terminates as a PDA and right posterolateral branch. There is moderate calcified plaque in the proximal RCA with associated stenosis of 50-69%. There is mild calcified plaque in the proximal to mid RCA with associated stenosis of 25-49%.   Right Atrium: Right atrial size is within normal limits.   Right Ventricle: The right ventricular cavity is within normal limits.   Left Atrium: Left atrial size is normal in size with no left atrial appendage filling defect.   Left Ventricle: The ventricular cavity size is within normal limits. There are no stigmata of prior infarction. There is no abnormal filling defect.   Pulmonary arteries: Normal in size without proximal filling defect.   Pulmonary veins: Normal pulmonary venous drainage.   Pericardium: Normal thickness with no significant effusion or calcium present.   Cardiac valves: The mitral valve is normal structure with severe mitral annular calcifications. The Aortic valve has been replaced with a 65mm Edwards Inspiris bioprosthetic valve.   Aorta: Normal caliber. Scattered calcifications of the ascending and descending aorta.    Extra-cardiac findings: See attached radiology report for non-cardiac structures.   IMPRESSION: 1. Coronary calcium score of 300. This was 54th percentile for age-, sex, and race-matched controls.   2. Normal coronary origin with right dominance.   3. 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 CAD RADS 3.   RECOMMENDATIONS: This study has been submitted for FFR flow analysis.   Consider symptom guided anti-ischemic and preventive  pharmacotherapy as well as risk factor modification per guideline-directed care.  Carotid stent post stroke 03/28/2021: FINDINGS:  The left common carotid arteriogram again demonstrates the left  external carotid origin to be narrowed by 50%. Its branches are  normally opacified.     The left internal carotid artery just distal to the bulb again  demonstrates a severe high-grade stenosis associated with a mild  ulceration and filling defect along the posterolateral wall. More  distally there is advancement of contrast to the cranial skull base.     The petrous, cavernous and supraclinoid segments are widely patent  with mild arteriosclerotic changes involving the proximal cavernous  segment.     The left middle cerebral artery opacifies into the capillary and  venous phases with a delayed focal occlusion of a distal M4 branch  of the inferior division.     Transient antegrade filling is seen of the right anterior cerebral  A1 segment.   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   Carotid Duplex 03/26/2021: Summary:  Right Carotid: Velocities in the right ICA are consistent with a 1-39%  stenosis.   Left Carotid: Velocities in the left ICA are consistent with a 80-99%  stenosis. Significant progression of disease when compared to previous  study 12/16/2017.    Vertebrals:  Bilateral vertebral arteries demonstrate antegrade flow.  Subclavians: Normal flow hemodynamics were seen in bilateral subclavian arteries.   Echo 03/26/2021:  1. Left ventricular ejection fraction, by estimation, is 60 to 65%. The  left ventricle has normal function. The left ventricle has no regional  wall motion abnormalities. There is mild left ventricular hypertrophy.  Left ventricular diastolic parameters  are consistent with Grade I diastolic dysfunction (impaired relaxation).   2. Right ventricular systolic function is normal. The right ventricular  size is normal.   3. Left atrial size was mildly dilated.   4. The mitral valve is normal in structure. No evidence of mitral valve  regurgitation. No evidence of mitral stenosis.   5. 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 Mean  gradient 12 mmHg with no significant regurgitation.   6. Aortic dilatation noted. There is mild dilatation of the ascending  aorta, measuring 41 mm.   7. The inferior vena cava is normal in size with greater than 50%  respiratory variability, suggesting right atrial pressure of 3 mmHg.   03/18/21 Exercise Tolerance Test:  Study Highlights Blood pressure demonstrated a normal response to exercise. ST segment depression of 5 mm was noted during stress in the II, III, aVF, V5 and V6 leads.   ETT with fair exercise tolerance (6:42); no chest pain but patient did complain of dyspnea; normal blood pressure response; 3 beats nonsustained ventricular tachycardia and frequent PVCs with exercise and 5 beats nonsustained ventricular tachycardia in recovery; 4 to 5 mm of ST depression in the inferior lateral leads and 3 mm ST elevation in AvR; abnormal ETT. Results discussed with Dr Anne Fu.  07/24/2020 Echo:  IMPRESSIONS   1. Frequent PVC's are noted. Left ventricular ejection fraction, by  estimation, is 65 to 70%. The left ventricle has normal function. The left  ventricle has no  regional wall motion abnormalities. There is mild  eccentric left ventricular hypertrophy of  the basal-septal segment. Left ventricular diastolic parameters are  consistent with Grade I diastolic dysfunction (impaired relaxation).   2. The mitral  valve is abnormal. Trivial mitral valve regurgitation.   3. The aortic valve has been repaired/replaced. There is a 25 mm Edwards  Inspiris bioprosthetic valve noted in the aortic position. Aortic valve  regurgitation is not visualized. Aortic valve area, by VTI measures 2.19  cm. Aortic valve mean gradient  measures 10.0 mmHg. Aortic valve Vmax measures 2.38 m/s. DI is 0.45.   4. Aortic dilatation noted. There is mild dilatation of the aortic root,  measuring 40 mm. There is mild to moderate dilatation of the ascending  aorta, measuring 44 mm.   5. Right ventricular systolic function is mildly reduced. The right  ventricular size is normal. There is normal pulmonary artery systolic  pressure.   6. The inferior vena cava is normal in size with greater than 50%  respiratory variability, suggesting right atrial pressure of 3 mmHg.   Comparison(s): Changes from prior study are noted. 01/25/2018: LVEF 60-65%,  bioprosthetic AVR - mean gradient 11 mmHg, aorta dilated to 42 mm.   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 This does not appear to impinge upon the left coronary artery.   Stable thoracic aortic aneurysm with maximal diameter of 4.0 x 4.0 cm.   EKG:  EKG is personally reviewed and interpreted. 06/19/2021: EKG was not ordered today. 03/26/2021: showed heart rate of 68 bpm with nonspecific ST-T wave depression  Recent Labs: 03/25/2021: ALT 24 03/30/2021: Hemoglobin 12.2; Platelets 195 06/10/2021: BUN 18; Creatinine, Ser 0.83; Potassium 4.4; Sodium 139   Recent Lipid Panel    Component Value Date/Time   CHOL 154 03/26/2021 0345   TRIG 118 03/26/2021 0345   HDL 39 (L)  03/26/2021 0345   CHOLHDL 3.9 03/26/2021 0345   VLDL 24 03/26/2021 0345   LDLCALC 91 03/26/2021 0345     Risk Assessment/Calculations:          Physical Exam:    VS:  BP (!) 150/70 (BP Location: Left Arm, Patient Position: Sitting, Cuff Size: Normal)   Pulse 66   Ht 5\' 11"  (1.803 m)   Wt 193 lb (87.5 kg)   SpO2 96%   BMI 26.92 kg/m     Wt Readings from Last 3 Encounters:  06/19/21 193 lb (87.5 kg)  06/06/21 193 lb (87.5 kg)  05/08/21 188 lb (85.3 kg)    GEN: Well nourished, well developed in no acute distress HEENT: Normal NECK: No JVD; No carotid bruits LYMPHATICS: No lymphadenopathy CARDIAC: RRR, soft systolic murmurs, no rubs, gallops RESPIRATORY:  Clear to auscultation without rales, wheezing or rhonchi  ABDOMEN: Soft, non-tender, non-distended MUSCULOSKELETAL:  No edema; No deformity  SKIN: Warm and dry NEUROLOGIC:  Alert and oriented x 3 PSYCHIATRIC:  Normal affect    ASSESSMENT:    1. Coronary artery disease involving native coronary artery of native heart without angina pectoris   2. S/P aortic valve replacement with bioprosthetic valve   3. Dilated aortic root (HCC)   4. Shortness of breath      PLAN:    In order of problems listed above: CAD (coronary artery disease) CT scan personally reviewed.  FFR analysis does show some evidence of possible flow-limiting disease in circumflex artery.  Other arteries have mild to moderate coronary artery disease with no flow limitation.  I personally reviewed his prior cardiac catheterization and the circumflex artery and branches were fairly small.  It may make sense for [place] to first trial aggressive medical therapy.  If he was having  significant anginal symptoms, we could always consider cardiac catheterization with possible PCI.  For now, continue with current plan.  S/P aortic valve replacement with bioprosthetic valve Doing well.  Echo reassuring  Dilated aortic root (HCC) On most recent CT scan, ascending  aorta measures 40 mm.  Stable from before.  Shortness of breath We will go ahead and transition him over from Brilinta over to Plavix.  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  Occasionally, Brilinta as a side effect will be associated with shortness of breath.  Lets see if this helps.  Follow-up: 3 months.  Medication Adjustments/Labs and Tests Ordered: Current medicines are reviewed at length with the patient today.  Concerns regarding medicines are outlined above.   No orders of the defined types were placed in this encounter.   Meds ordered this encounter  Medications   clopidogrel (PLAVIX) 75 MG tablet    Sig: Take 1 tablet (75 mg total) by mouth daily.    Dispense:  90 tablet    Refill:  3     Patient Instructions  Medication Instructions:  Please discontinue your Brilinta and start Plavix 75 mg a day. Continue all other medications as listed.  *If you need a refill on your cardiac medications before your next appointment, please call your pharmacy*  Follow-Up: At [facility], you and your health needs are our priority.  As part of our continuing mission to provide you with exceptional heart care, we have created designated Provider Care Teams.  These Care Teams include your primary Cardiologist (physician) and Advanced Practice Providers (APPs -  Physician Assistants and Nurse Practitioners) who all work together to provide you with the care you need, when you need it.  We recommend signing up for the patient portal called "MyChart".  Sign up information is provided on this After Visit Summary.  MyChart is used to connect with patients for Virtual Visits (Telemedicine).  Patients are able to view lab/test results, encounter notes, upcoming appointments, etc.  Non-urgent messages can be sent to your provider as well.   To learn more about what you can do with MyChart, go to [email or web address].    Your next appointment:   3 month(s)  The format  for your next appointment:   In Person  Provider:   Donato Schultz, MD  Thank you for choosing [facility]!!     I,Mathew Stumpf,acting as a scribe for Donato Schultz, MD.,have documented all relevant documentation on the behalf of Donato Schultz, MD,as directed by  Donato Schultz, MD while in the presence of Donato Schultz, MD.  I, Donato Schultz, MD, have reviewed all documentation for this visit. The documentation on 06/19/21 for the exam, diagnosis, procedures, and orders are all accurate and complete.   Signed, Donato Schultz, MD  06/19/2021 5:18 PM    [facility]

## 2021-06-19 NOTE — Patient Instructions (Signed)
Medication Instructions:  Please discontinue your Brilinta and start Plavix 75 mg a day. Continue all other medications as listed.  *If you need a refill on your cardiac medications before your next appointment, please call your pharmacy*  Follow-Up: At [facility], you and your health needs are our priority.  As part of our continuing mission to provide you with exceptional heart care, we have created designated Provider Care Teams.  These Care Teams include your primary Cardiologist (physician) and Advanced Practice Providers (APPs -  Physician Assistants and Nurse Practitioners) who all work together to provide you with the care you need, when you need it.  We recommend signing up for the patient portal called "MyChart".  Sign up information is provided on this After Visit Summary.  MyChart is used to connect with patients for Virtual Visits (Telemedicine).  Patients are able to view lab/test results, encounter notes, upcoming appointments, etc.  Non-urgent messages can be sent to your provider as well.   To learn more about what you can do with MyChart, go to [email or web address].    Your next appointment:   3 month(s)  The format for your next appointment:   In Person  Provider:   Donato Schultz, MD  Thank you for choosing [facility]!!

## 2021-06-19 NOTE — Assessment & Plan Note (Signed)
On most recent CT scan, ascending aorta measures 40 mm.  Stable from before.

## 2021-06-19 NOTE — Assessment & Plan Note (Signed)
CT scan personally reviewed.  FFR analysis does show some evidence of possible flow-limiting disease in circumflex artery.  Other arteries have mild to moderate coronary artery disease with no flow limitation.  I personally reviewed his prior cardiac catheterization and the circumflex artery and branches were fairly small.  It may make sense for [place] to first trial aggressive medical therapy.  If he was having significant anginal symptoms, we could always consider cardiac catheterization with possible PCI.  For now, continue with current plan.

## 2021-06-19 NOTE — Assessment & Plan Note (Signed)
Doing well.  Echo reassuring

## 2021-06-24 DIAGNOSIS — H34212 Partial retinal artery occlusion, left eye: Secondary | ICD-10-CM | POA: Diagnosis not present

## 2021-06-30 DIAGNOSIS — I8312 Varicose veins of left lower extremity with inflammation: Secondary | ICD-10-CM | POA: Diagnosis not present

## 2021-07-24 DIAGNOSIS — I10 Essential (primary) hypertension: Secondary | ICD-10-CM | POA: Diagnosis not present

## 2021-07-24 DIAGNOSIS — E1169 Type 2 diabetes mellitus with other specified complication: Secondary | ICD-10-CM | POA: Diagnosis not present

## 2021-07-24 DIAGNOSIS — E7800 Pure hypercholesterolemia, unspecified: Secondary | ICD-10-CM | POA: Diagnosis not present

## 2021-07-24 DIAGNOSIS — I480 Paroxysmal atrial fibrillation: Secondary | ICD-10-CM | POA: Diagnosis not present

## 2021-07-24 DIAGNOSIS — G459 Transient cerebral ischemic attack, unspecified: Secondary | ICD-10-CM | POA: Diagnosis not present

## 2021-07-28 ENCOUNTER — Encounter: Payer: Self-pay | Admitting: Cardiology

## 2021-09-24 ENCOUNTER — Other Ambulatory Visit: Payer: Self-pay

## 2021-09-24 ENCOUNTER — Ambulatory Visit: Payer: Medicare HMO | Admitting: Cardiology

## 2021-09-29 DIAGNOSIS — H5201 Hypermetropia, right eye: Secondary | ICD-10-CM | POA: Diagnosis not present

## 2021-09-29 DIAGNOSIS — H35373 Puckering of macula, bilateral: Secondary | ICD-10-CM | POA: Diagnosis not present

## 2021-09-29 DIAGNOSIS — H34212 Partial retinal artery occlusion, left eye: Secondary | ICD-10-CM | POA: Diagnosis not present

## 2021-10-07 DIAGNOSIS — U071 COVID-19: Secondary | ICD-10-CM | POA: Diagnosis not present

## 2021-10-07 DIAGNOSIS — J029 Acute pharyngitis, unspecified: Secondary | ICD-10-CM | POA: Diagnosis not present

## 2021-10-16 ENCOUNTER — Other Ambulatory Visit: Payer: Self-pay

## 2021-10-16 ENCOUNTER — Ambulatory Visit: Payer: Medicare HMO | Admitting: Podiatry

## 2021-10-16 DIAGNOSIS — B351 Tinea unguium: Secondary | ICD-10-CM

## 2021-10-20 NOTE — Progress Notes (Signed)
Subjective: 76 year old male presents the office today for follow-up evaluation of onychomycosis.  He still been using the Kiln.  Thinks it may be helping some but doing about the same.  No pain in the nails no swelling redness or any drainage.  No other concerns today.    Objective: AAO x3, NAD DP/PT pulses palpable bilaterally, CRT less than 3 seconds The nails most affected today are the hallux toenails as well as the right third digit toenail which is still the case today.  There is still yellow, brown discoloration but there is some slight clear on the proximal nail fold with minimal improvement.  There is no edema, erythema or signs of infection.  There is no open lesions.   No pain with calf compression.  Assessment: Onychomycosis  Plan: -All treatment options discussed with the patient including all alternatives, risks, complications.  -We discussed several treatment options including continue with medication versus nail removal.  Not causing any pain.  Recommend continue with treatment of the nails.  Can continue with Jublia.  If needed we can do another round of Lamisil in the future.  Monitor for any signs or symptom infection.  If symptoms continue or worsen may consider nail removal but discussed that would not recommend doing this for cosmetic reasons.  Trula Slade DPM

## 2021-10-21 ENCOUNTER — Telehealth (HOSPITAL_COMMUNITY): Payer: Self-pay

## 2021-10-21 NOTE — Telephone Encounter (Signed)
Called to schedule us carotid, no answer, left vm. AW  

## 2021-10-30 ENCOUNTER — Other Ambulatory Visit (HOSPITAL_COMMUNITY): Payer: Self-pay | Admitting: Interventional Radiology

## 2021-10-30 DIAGNOSIS — I771 Stricture of artery: Secondary | ICD-10-CM

## 2021-11-05 ENCOUNTER — Other Ambulatory Visit: Payer: Self-pay

## 2021-11-05 ENCOUNTER — Ambulatory Visit (HOSPITAL_COMMUNITY)
Admission: RE | Admit: 2021-11-05 | Discharge: 2021-11-05 | Disposition: A | Discharge status: Home / Self Care | Payer: Medicare HMO | Source: Ambulatory Visit | Attending: Interventional Radiology | Admitting: Interventional Radiology

## 2021-11-05 DIAGNOSIS — I771 Stricture of artery: Secondary | ICD-10-CM | POA: Diagnosis not present

## 2021-11-06 ENCOUNTER — Encounter: Payer: Self-pay | Admitting: Adult Health

## 2021-11-06 ENCOUNTER — Ambulatory Visit: Payer: Medicare HMO | Admitting: Adult Health

## 2021-11-06 VITALS — BP 137/73 | HR 62 | Ht 71.0 in | Wt 192.0 lb

## 2021-11-06 DIAGNOSIS — H34232 Retinal artery branch occlusion, left eye: Secondary | ICD-10-CM

## 2021-11-06 DIAGNOSIS — I63232 Cerebral infarction due to unspecified occlusion or stenosis of left carotid arteries: Secondary | ICD-10-CM | POA: Diagnosis not present

## 2021-11-06 NOTE — Patient Instructions (Signed)
Continue aspirin 81 mg daily  and atorvastatin  for secondary stroke prevention ? ?Continue to follow with Dr. Corliss Skains as advised - ongoing use of plavix will be determined by Dr. Corliss Skains  ? ?Continue to follow up with PCP regarding cholesterol and blood pressure management  ?Maintain strict control of hypertension with blood pressure goal below 130/90 and cholesterol with LDL cholesterol (bad cholesterol) goal below 70 mg/dL.  ? ?Signs of a Stroke? Follow the BEFAST method:  ?Balance Watch for a sudden loss of balance, trouble with coordination or vertigo ?Eyes Is there a sudden loss of vision in one or both eyes? Or double vision?  ?Face: Ask the person to smile. Does one side of the face droop or is it numb?  ?Arms: Ask the person to raise both arms. Does one arm drift downward? Is there weakness or numbness of a leg? ?Speech: Ask the person to repeat a simple phrase. Does the speech sound slurred/strange? Is the person confused ? ?Time: If you observe any of these signs, call 911. ? ? ? ? ? ? ? ?Thank you for coming to see [place] at [facility]. I hope we have been able to provide you high quality care today. ? ?You may receive a patient satisfaction survey over the next few weeks. We would appreciate your feedback and comments so that we may continue to improve ourselves and the health of our patients. ? ?

## 2021-11-06 NOTE — Progress Notes (Signed)
[facility] [address] [place]. [place] [address] [phone]       STROKE FOLLOW UP NOTE  Mr. Stephen Mullins Date of Birth:  08-12-1946 Medical Record Number:  [id number]   Reason for Referral:  stroke follow up    SUBJECTIVE:   CHIEF COMPLAINT:  Chief Complaint  Patient presents with   Follow-up    RM 3 with spouse carolyn  Pt is well and stable, no new concerns     HPI:   Update 11/06/2021 JM: 76 year old male with history of stroke who returns for 4-month follow-up visit.  Overall stable from stroke standpoint without new stroke/TIA symptoms.  Reports residual left eye visual impairment - stable without worsening.  Can interfere with small print reading but otherwise does not interfere with daily activity or functioning.  Continues to maintain ADLs and IADLs independently as well as driving.  Routinely followed by ophthalmology.  Remains on aspirin and atorvastatin without side effects.  Remains on Plavix s/p stent -completed carotid ultrasound yesterday which showed patent stent. Intolerant to Brilinta with SOB side effects - improved after stopping.  Blood pressure today 137/73.  Routinely monitors at home and typically 120s/60s.  No new concerns at this time.     History provided for reference purposes only Initial visit 05/08/2021 JM: Mr. Stephen Mullins is being seen for hospital follow-up accompanied by his wife, Hoyle Sauer.  Overall doing well.  Recovered physically 4 to 5 days after discharge.  He continues to have visual loss in left eye but does not interfere with daily functioning.  He has returned back to all prior activities including driving.  Routinely followed by ophthalmology.  Denies new stroke/TIA symptoms.  Remains on Brilinta and aspirin - reports SOB on exertion on Brilinta but does not effect daily functioning.  Remains on atorvastatin 40 mg daily.  Blood pressure today 140/84. Occasionally monitors at home and typicaly stable - 110-120s/60s. Can  feel dizzy at times if stands too quickly. Eval by Dr. Estanislado Pandy with plans on repeating carotid ultrasound towards the end of this year.  No further concerns at this time.  Stroke admission 03/25/2021 Stephen Mullins is a 76 y.o. male with PMH significant for hyperlipidemia, hypertension, GERD, aortic valve replacement who presented on 03/25/2021 with transient right facial numbness and facial droop and left central vision crescent-shaped visual loss.  Personally reviewed hospitalization pertinent progress notes, lab work and imaging.  Evaluated by Dr. Leonie Man for multifocal left MCA territory infarcts and L BRAO likely due to symptomatic high-grade proximal left ICA stenosis with CUS showing 80 to 89% stenosis.  IR angio preocclusive severe proximal left ICA stenosis s/p stent by Dr. Estanislado Pandy.  DAPT initiated with Brilinta and aspirin and follow-up with Dr. Estanislado Pandy outpatient.  EF 60 to 65%.  LDL 91.  A1c 6.4.  Increase home dose atorvastatin 10 mg to 40 mg daily.  Other stroke risk factors include advanced age, former tobacco use, CAD and AVR.  No prior stroke history.  Advised to follow-up outpatient with ophthalmology for BRAO.  Therapy eval recommended outpatient PT/OT.     PERTINENT IMAGING  Code Stroke CT head: left tempoparietal junction infarct acute vs subacute   MRI brain: multifocal acute infarcts within the posterior left MCA territory   Carotid US: minimal right ICA stenosis. 80-89% stenosis of left ICA. Normal vertebral flow   Dr Estanislado Pandy [id number]  S/P  prox LICA revascularization with stent assisted angioplasty with distal protection for symptomatic stenosis. Rt rad route for angioplasty  with distal protection and RT CFA route for stenting with distal protection . Placement of stent vis rad route unsucceessful due to vessel anatomy.    ROS:   14 system review of systems performed and negative with exception of those listed in HPI  PMH:  Past Medical History:  Diagnosis  Date   Bicuspid aortic valve    with aortic stenosis and mild insufficiency, mild to moderate aortic root dilitation   Borderline hyperlipidemia    Diabetes mellitus without complication (HCC)    Elevated fasting glucose    Elevated PSA    due to prostatitis   GERD (gastroesophageal reflux disease)    Heart murmur    Hypertension    Nail fungus    Thoracic ascending aortic aneurysm     PSH:  Past Surgical History:  Procedure Laterality Date   AORTIC VALVE REPLACEMENT N/A 12/20/2017   Procedure: AORTIC VALVE REPLACEMENT (AVR);  Surgeon: Prescott Gum, Collier Salina, MD;  Location: [facility];  Service: Open Heart Surgery;  Laterality: N/A;   CATARACT EXTRACTION, BILATERAL     IR ANGIO INTRA EXTRACRAN SEL COM CAROTID INNOMINATE BILAT MOD SED  03/26/2021   IR ANGIO INTRA EXTRACRAN SEL COM CAROTID INNOMINATE UNI L MOD SED  04/03/2021   IR ANGIO VERTEBRAL SEL SUBCLAVIAN INNOMINATE BILAT MOD SED  03/26/2021   IR CT HEAD LTD  03/28/2021   IR INTRAVSC STENT CERV CAROTID W/EMB-PROT MOD SED INCL ANGIO  03/28/2021   IR RADIOLOGIST EVAL & MGMT  04/15/2021   IR US GUIDE VASC ACCESS RIGHT  03/28/2021   RADIOLOGY WITH ANESTHESIA N/A 03/28/2021   Procedure: STENT PLACMENT;  Surgeon: Luanne Bras, MD;  Location: [facility];  Service: Radiology;  Laterality: N/A;   RIGHT/LEFT HEART CATH AND CORONARY ANGIOGRAPHY N/A 11/09/2017   Procedure: RIGHT/LEFT HEART CATH AND CORONARY ANGIOGRAPHY;  Surgeon: Burnell Blanks, MD;  Location: [facility];  Service: Cardiovascular;  Laterality: N/A;   TEE WITHOUT CARDIOVERSION N/A 12/20/2017   Procedure: TRANSESOPHAGEAL ECHOCARDIOGRAM (TEE);  Surgeon: Prescott Gum, Collier Salina, MD;  Location: [facility];  Service: Open Heart Surgery;  Laterality: N/A;    Social History:  Social History   Socioeconomic History   Marital status: Married    Spouse name: Not on file   Number of children: Not on file   Years of education: Not on file   Highest education level: Not on file  Occupational  History   Occupation: retired  Tobacco Use   Smoking status: Former    Types: Cigarettes    Quit date: 10/20/2001    Years since quitting: 20.0   Smokeless tobacco: Never  Vaping Use   Vaping Use: Never used  Substance and Sexual Activity   Alcohol use: Yes    Alcohol/week: 1.0 - 2.0 standard drink    Types: 1 - 2 Glasses of wine per week   Drug use: No   Sexual activity: Not on file  Other Topics Concern   Not on file  Social History Narrative   Married 55 yrs (as of July 2022). Enjoys golf.     Social Determinants of Health   Financial Resource Strain: Not on file  Food Insecurity: Not on file  Transportation Needs: Not on file  Physical Activity: Not on file  Stress: Not on file  Social Connections: Not on file  Intimate Partner Violence: Not on file    Family History:  Family History  Problem Relation Age of Onset   Prostate cancer Father  Medications:   Current Outpatient Medications on File Prior to Visit  Medication Sig Dispense Refill   acetaminophen (TYLENOL) 500 MG tablet Take 2 tablets (1,000 mg total) by mouth every 6 (six) hours as needed. 30 tablet 0   amoxicillin (AMOXIL) 500 MG capsule TAKE 4 TABLETS BY MOUTH 1 HOUR BEFORE DENTAL PROCEDURE 4 capsule 3   aspirin EC 81 MG tablet Take 81 mg by mouth in the morning. Swallow whole.     atorvastatin (LIPITOR) 40 MG tablet Take 1 tablet (40 mg total) by mouth in the morning. 30 tablet 0   clopidogrel (PLAVIX) 75 MG tablet Take 1 tablet (75 mg total) by mouth daily. 90 tablet 3   Efinaconazole 10 % SOLN Apply 1 drop topically daily. 4 mL 11   hydrochlorothiazide (HYDRODIURIL) 12.5 MG tablet Take 1 tablet by mouth once daily 90 tablet 3   hydrocortisone cream 1 % Apply 1 application topically daily as needed for itching.     lisinopril (ZESTRIL) 5 MG tablet Take 2.5 mg by mouth in the morning.     neomycin-bacitracin-polymyxin (NEOSPORIN) ointment Apply 1 application topically daily as needed for wound care.      oxymetazoline (AFRIN) 0.05 % nasal spray Place 1 spray into both nostrils daily as needed for congestion.     metoprolol tartrate (LOPRESSOR) 50 MG tablet Take 1 tablet (50 mg total) by mouth once for 1 dose. Take one tablet 2 hours before your CT scan 1 tablet 0   No current facility-administered medications on file prior to visit.    Allergies:   Allergies  Allergen Reactions   Tetanus Toxoid, Adsorbed Other (See Comments)    Pt. States as small child he had tetnus made from horse serum and he turned 'blue' ?? SHORTNESS OF BREATH ??      OBJECTIVE:  Physical Exam  Vitals:   11/06/21 1242  BP: 137/73  Pulse: 62  Weight: 192 lb (87.1 kg)  Height: 5\' 11"  (1.803 m)    Body mass index is 26.78 kg/m. No results found.  General: well developed, well nourished, very pleasant elderly Caucasian male, seated, in no evident distress Head: head normocephalic and atraumatic.   Neck: supple with no carotid or supraclavicular bruits Cardiovascular: regular rate and rhythm, no murmurs Musculoskeletal: no deformity Skin:  no rash/petichiae Vascular:  Normal pulses all extremities   Neurologic Exam Mental Status: Awake and fully alert.  Fluent speech and language.  Oriented to place and time. Recent and remote memory intact. Attention span, concentration and fund of knowledge appropriate. Mood and affect appropriate.  Cranial Nerves: Pupils equal, briskly reactive to light. Extraocular movements full without nystagmus. Visual fields full to confrontation OU but OS "hazy" appearance crescent shaped vision impairment of central vision. Hearing intact. Facial sensation intact. Face, tongue, palate moves normally and symmetrically.  Motor: Normal bulk and tone. Normal strength in all tested extremity muscles Sensory.: intact to touch , pinprick , position and vibratory sensation.  Coordination: Rapid alternating movements normal in all extremities. Finger-to-nose and heel-to-shin  performed accurately bilaterally. Gait and Station: Arises from chair without difficulty. Stance is normal. Gait demonstrates normal stride length and balance without use of assistive device. Tandem walk and heel toe without difficulty.  Reflexes: 1+ and symmetric. Toes downgoing.          ASSESSMENT: BRENHAM RAYNER is a 76 y.o. year old male with multifocal left MCA territory infarcts and L BRAO likely due to symptomatic high-grade proximal left ICA stenosis  s/p stent on 03/25/2021 after presenting with transient right facial numbness and facial droop and OS central vision loss. Vascular risk factors include HTN, HLD, former tobacco use, CAD and AVR.      PLAN:  Left MCA stroke, L BRAO:  Residual deficit: OS visual impairment.  No physical deficits.  Continue to follow with ophthalmology Continue aspirin 81 mg daily  and atorvastatin 40 mg daily for secondary stroke prevention.   Discussed secondary stroke prevention measures and importance of close PCP follow up for aggressive stroke risk factor management including HTN with BP goal<130/90 and HLD with LDL goal<70.   I have gone over the pathophysiology of stroke, warning signs and symptoms, risk factors and their management in some detail with instructions to go to the closest emergency room for symptoms of concern. L ICA stenosis: s/p stent by Dr. Estanislado Pandy.  Carotid ultrasound 11/05/2021 left carotid stent patent, ECA > 50% stenosed, R ICA 1 to 39% stenosis. Was switched from Brilinta to Plavix due to intolerance of Brilinta. Advised to discuss ongoing need of Plavix with IR     Doing well from stroke standpoint and risk factors are managed by PCP. She may follow up PRN, as usual for our patients who are strictly being followed for stroke. If any new neurological issues should arise, request PCP place referral for evaluation by one of our neurologists. Thank you.     CC:  PCP: Kristen Loader, FNP    I spent 31 minutes of  face-to-face and non-face-to-face time with patient and wife.  This included previsit chart review, lab review, study review, electronic health record documentation, patient and wife education regarding prior stroke including etiology and residual deficits, secondary stroke prevention measures and importance of managing stroke risk factors and answered all other questions to patient and wife's satisfaction   Frann Rider, AGNP-BC  [facility] [address] [place], [place] [address]  Phone [phone] Fax [phone] Note: This document was prepared with digital dictation and possible smart phrase technology. Any transcriptional errors that result from this process are unintentional.

## 2021-11-11 ENCOUNTER — Telehealth (HOSPITAL_COMMUNITY): Payer: Self-pay

## 2021-11-11 NOTE — Telephone Encounter (Signed)
Called pt regarding recent imaging, no answer, left vm. AW  

## 2021-11-12 ENCOUNTER — Telehealth (HOSPITAL_COMMUNITY): Payer: Self-pay

## 2021-11-12 NOTE — Telephone Encounter (Signed)
Pt agreed to f/u in 6 months with a us carotid. AW 

## 2021-11-14 ENCOUNTER — Other Ambulatory Visit: Payer: Self-pay | Admitting: Cardiothoracic Surgery

## 2021-11-14 DIAGNOSIS — Z953 Presence of xenogenic heart valve: Secondary | ICD-10-CM

## 2021-11-18 NOTE — Progress Notes (Signed)
?Cardiology Office Note:   ? ?Date:  11/19/2021  ? ?ID:  Stephen LivelyDavid E Malphrus, DOB [birth year], MRN [id number] ? ?PCP:  Soundra PilonBrake, Andrew R, FNP ?  ?[facility] Providers ?Cardiologist:  Donato SchultzMark Exavier Lina, MD    ? ?Referring MD: Soundra PilonBrake, Andrew R, FNP  ? ? ?History of Present Illness:   ? ?Stephen Mullins is a 76 y.o. male here for follow-up coronary artery disease, left carotid stent, aortic valve replacement, prior shortness of breath on Brilinta resolved after switching to Plavix. ? ?At his last appointment we discussed abnormal GXT per MS. his treadmill showed marked ST segment depression diffusely with aVR elevation which is suggestive of multivessel coronary artery disease.  Interestingly in 2019 prior to his aortic valve replacement valve, he had mild nonobstructive disease noted on cardiac cath. ? ?In the past he underwent an aortic valve replacement in April 2019 with a number 25 mm bioprosthetic valve.  LV function normal.  Dilated aorta. Doing well. ?  ?Previous EKG showed sinus rhythm with ventricular bigeminy heart rate 52 but effective heart rate likely in the 30s secondary to PVCs.  Had no dizziness no syncope.  Walking 1 to 2 miles a day. ? ?He was doing reasonably well. After his stress test on 03/18/21 he had no adverse effect. He never had negative symptoms when working outside or walking around his home. Otherwise he was tolerating his medication well.  ? ?At his last visit, he complained of shortness of breath. He was transitioned from Brilinta to Plavix. Carotid ultrasound revealed 1-39% stenosis in the right ICA and >50% stenosis in the left ECA.  ? ?Today, he is doing well with no major complaints. Since switching to Plavix, he has not noticed recurrent episodes of shortness of breath. Since discontinuing the metoprolol, his heart rate is in the 60s bpm and he is feeling better. ? ?He records his blood pressure at home. This morning, his blood pressure was 121/71 which is close to his average.  ? ?He denies any  palpitations, chest pain, or shortness of breath, lightheadedness, headaches, syncope, orthopnea, PND, lower extremity edema or exertional symptoms. ? ?Past Medical History:  ?Diagnosis Date  ? Bicuspid aortic valve   ? with aortic stenosis and mild insufficiency, mild to moderate aortic root dilitation  ? Borderline hyperlipidemia   ? Diabetes mellitus without complication (HCC)   ? Elevated fasting glucose   ? Elevated PSA   ? due to prostatitis  ? GERD (gastroesophageal reflux disease)   ? Heart murmur   ? Hypertension   ? Nail fungus   ? Thoracic ascending aortic aneurysm   ? ? ?Past Surgical History:  ?Procedure Laterality Date  ? AORTIC VALVE REPLACEMENT N/A 12/20/2017  ? Procedure: AORTIC VALVE REPLACEMENT (AVR);  Surgeon: Donata ClayVan Trigt, Theron AristaPeter, MD;  [facility];  Service: Open Heart Surgery;  Laterality: N/A;  ? CATARACT EXTRACTION, BILATERAL    ? IR ANGIO INTRA EXTRACRAN SEL COM CAROTID INNOMINATE BILAT MOD SED  03/26/2021  ? IR ANGIO INTRA EXTRACRAN SEL COM CAROTID INNOMINATE UNI L MOD SED  04/03/2021  ? IR ANGIO VERTEBRAL SEL SUBCLAVIAN INNOMINATE BILAT MOD SED  03/26/2021  ? IR CT HEAD LTD  03/28/2021  ? IR INTRAVSC STENT CERV CAROTID W/EMB-PROT MOD SED INCL ANGIO  03/28/2021  ? IR RADIOLOGIST EVAL & MGMT  04/15/2021  ? IR US GUIDE VASC ACCESS RIGHT  03/28/2021  ? RADIOLOGY WITH ANESTHESIA N/A 03/28/2021  ? Procedure: STENT PLACMENT;  Surgeon: Julieanne Cottoneveshwar, Sanjeev, MD;  Location: [facility];  Service: Radiology;  Laterality: N/A;  ? RIGHT/LEFT HEART CATH AND CORONARY ANGIOGRAPHY N/A 11/09/2017  ? Procedure: RIGHT/LEFT HEART CATH AND CORONARY ANGIOGRAPHY;  Surgeon: [name], MD;  [facility]: [facility];  Service: Cardiovascular;  Laterality: N/A;  ? TEE WITHOUT CARDIOVERSION N/A 12/20/2017  ? Procedure: TRANSESOPHAGEAL ECHOCARDIOGRAM (TEE);  Surgeon: [name], [name], MD;  Location[facility];  Service: Open Heart Surgery;  Laterality: N/A;  ? ? ?Current Medications: ?Current Meds  ?Medication Sig  ?  acetaminophen (TYLENOL) 500 MG tablet Take 2 tablets (1,000 mg total) by mouth every 6 (six) hours as needed.  ? amoxicillin (AMOXIL) 500 MG capsule TAKE 4 TABLETS BY MOUTH 1 HOUR BEFORE DENTAL PROCEDURE  ? aspirin EC 81 MG tablet Take 81 mg by mouth in the morning. Swallow whole.  ? clopidogrel (PLAVIX) 75 MG tablet Take 1 tablet (75 mg total) by mouth daily.  ? Efinaconazole 10 % SOLN Apply 1 drop topically daily.  ? hydrochlorothiazide (HYDRODIURIL) 12.5 MG tablet Take 1 tablet by mouth once daily  ? hydrocortisone cream 1 % Apply 1 application topically daily as needed for itching.  ? lisinopril (ZESTRIL) 5 MG tablet Take 2.5 mg by mouth in the morning.  ? neomycin-bacitracin-polymyxin (NEOSPORIN) ointment Apply 1 application topically daily as needed for wound care.  ? oxymetazoline (AFRIN) 0.05 % nasal spray Place 1 spray into both nostrils daily as needed for congestion.  ?  ? ?Allergies:   Tetanus toxoid, adsorbed  ? ?Social History  ? ?Socioeconomic History  ? Marital status: Married  ?  Spouse name: Not on file  ? Number of children: Not on file  ? Years of education: Not on file  ? Highest education level: Not on file  ?Occupational History  ? Occupation: retired  ?Tobacco Use  ? Smoking status: Former  ?  Types: Cigarettes  ?  Quit date: 10/20/2001  ?  Years since quitting: 20.0  ? Smokeless tobacco: Never  ?Vaping Use  ? Vaping Use: Never used  ?Substance and Sexual Activity  ? Alcohol use: Yes  ?  Alcohol/week: 1.0 - 2.0 standard drink  ?  Types: 1 - 2 Glasses of wine per week  ? Drug use: No  ? Sexual activity: Not on file  ?Other Topics Concern  ? Not on file  ?Social History Narrative  ? Married 55 yrs (as of July 2022). Enjoys golf.    ? ?Social Determinants of Health  ? ?Financial Resource Strain: Not on file  ?Food Insecurity: Not on file  ?Transportation Needs: Not on file  ?Physical Activity: Not on file  ?Stress: Not on file  ?Social Connections: Not on file  ?  ? ?Family History: ?The  patient's family history includes Prostate cancer in his father. ? ?ROS:   ?Please see the history of present illness.  ?All other systems reviewed and are negative. ? ?EKGs/Labs/Other Studies Reviewed:   ? ?The following studies were reviewed today: ?Carotid US /1/23 ?Summary:  ?Right Carotid: Velocities in the right ICA are consistent with a 1-39%  ?stenosis.  ? ?Left Carotid: The ECA appears >50% stenosed. The CCA and ICA (distal) were near-normal with only minimal wall thickening or plaque.  ?Left carotid stent is patent without evidence of stenosis.  ? ?Vertebrals:  Bilateral vertebral arteries demonstrate antegrade flow.  ?Subclavians: Normal flow hemodynamics were seen in bilateral subclavian arteries.  ? ?FFRCT Analysis 06/16/2021: ?FINDINGS: ?FFRct analysis was performed on the original cardiac CT angiogram ?  dataset. Diagrammatic representation of the FFRct analysis is ?provided in a separate PDF document in PACS. This dictation was ?created using the PDF document and an interactive 3D model of the ?results. 3D model is not available in the EMR/PACS. Normal FFR range ?is >0.80. ?  ?Left Main: findings LM FFR = 0.98. ?  ?LAD: No significant stenosis. Proximal FFR =0.91, Mid FFR = 0.85. ?The dista LAD could not be modeled. ?  ?LCX: Possible significant stenosis in the mid to distal LCx. ?Proximal FFR = 0.98, Mid FFR =0.62, Distal FFR = 0.55. Normal flow ?in large OM1. ?  ?RCA: No significant stenosis. Proximal FFR =0.99, Mid FFR = 0.93, ?Distal FFR = 0.90. Prox PDA FFR = 0.90, distal PDA FFR = 0.76. ?  ?IMPRESSION: ?1. Coronary CTA FFR flow analysis demonstrates possible flow ?limiting lesion in the mid LCx. Proximal LCx FFR 0.90>>Mid LCx FFR ?0.62. ?  ?2. Recommend aggressive antianginal therapy and consideration of ?cardiac catheterization. ? ?Cardiac CT w/Calcium Score 06/16/2021: ?FINDINGS: ?Image quality: Excellent. ?  ?Noise artifact is: Limited. ?  ?Coronary Arteries:  Normal coronary origin.  Right  dominance. ?  ?Left main: The left main is a large caliber vessel with a normal ?take off from the left coronary cusp that bifurcates to form a left ?anterior descending artery and a left circumflex artery. trifurcate

## 2021-11-19 ENCOUNTER — Ambulatory Visit: Payer: Medicare HMO | Admitting: Cardiology

## 2021-11-19 ENCOUNTER — Encounter: Payer: Self-pay | Admitting: Cardiology

## 2021-11-19 ENCOUNTER — Other Ambulatory Visit: Payer: Self-pay

## 2021-11-19 VITALS — BP 142/82 | HR 62 | Ht 71.0 in | Wt 196.4 lb

## 2021-11-19 DIAGNOSIS — Z79899 Other long term (current) drug therapy: Secondary | ICD-10-CM | POA: Diagnosis not present

## 2021-11-19 DIAGNOSIS — E7800 Pure hypercholesterolemia, unspecified: Secondary | ICD-10-CM | POA: Diagnosis not present

## 2021-11-19 DIAGNOSIS — Z953 Presence of xenogenic heart valve: Secondary | ICD-10-CM

## 2021-11-19 DIAGNOSIS — I2510 Atherosclerotic heart disease of native coronary artery without angina pectoris: Secondary | ICD-10-CM | POA: Diagnosis not present

## 2021-11-19 DIAGNOSIS — I63232 Cerebral infarction due to unspecified occlusion or stenosis of left carotid arteries: Secondary | ICD-10-CM

## 2021-11-19 LAB — LIPID PANEL
Chol/HDL Ratio: 3.1 ratio (ref 0.0–5.0)
Cholesterol, Total: 130 mg/dL (ref 100–199)
HDL: 42 mg/dL (ref >39)
LDL Chol Calc (NIH): 62 mg/dL (ref 0–99)
Triglycerides: 154 mg/dL — ABNORMAL HIGH (ref 0–149)
VLDL Cholesterol Cal: 26 mg/dL (ref 5–40)

## 2021-11-19 LAB — ALT: ALT: 20 IU/L (ref 0–44)

## 2021-11-19 NOTE — Assessment & Plan Note (Addendum)
Prior CT scan reviewed.  Circumflex artery FFR was abnormal however the arteries appear fairly small in caliber.  He is doing very well without any symptoms currently.  Continue with aggressive medical management. ?After stopping Brilinta, his shortness of breath went away.  He is now on Plavix.  Excellent. ?

## 2021-11-19 NOTE — Assessment & Plan Note (Signed)
Previously LDL was 91.  We increased his atorvastatin from 10 up to 40, high intensity dose.  We will repeat lipid panel today.  No myalgias.  LDL goal less than 70. ?

## 2021-11-19 NOTE — Assessment & Plan Note (Signed)
Left-sided carotid stent.  Plavix and aspirin.  Vascular ultrasound reviewed from 2023.  Widely patent stent.  Continue with aggressive medical management. ?

## 2021-11-19 NOTE — Patient Instructions (Signed)
Medication Instructions:  The current medical regimen is effective;  continue present plan and medications.  *If you need a refill on your cardiac medications before your next appointment, please call your pharmacy*  Lab Work: Please have blood work today (Lipid/ALT)  If you have labs (blood work) drawn today and your tests are completely normal, you will receive your results only by: MyChart Message (if you have MyChart) OR A paper copy in the mail If you have any lab test that is abnormal or we need to change your treatment, we will call you to review the results.  Follow-Up: At [facility], you and your health needs are our priority.  As part of our continuing mission to provide you with exceptional heart care, we have created designated Provider Care Teams.  These Care Teams include your primary Cardiologist (physician) and Advanced Practice Providers (APPs -  Physician Assistants and Nurse Practitioners) who all work together to provide you with the care you need, when you need it.  We recommend signing up for the patient portal called "MyChart".  Sign up information is provided on this After Visit Summary.  MyChart is used to connect with patients for Virtual Visits (Telemedicine).  Patients are able to view lab/test results, encounter notes, upcoming appointments, etc.  Non-urgent messages can be sent to your provider as well.   To learn more about what you can do with MyChart, go to [email or web address].    Your next appointment:   1 year(s)  The format for your next appointment:   In Person  Provider:   Mark Skains, MD     Thank you for choosing [facility]!!    

## 2021-11-19 NOTE — Assessment & Plan Note (Addendum)
Bioprosthetic valve 25 mm.  Doing well.  Echocardiogram reviewed.  Murmur heard on exam.  Continue with dental prophylaxis. ?

## 2021-12-03 DIAGNOSIS — I10 Essential (primary) hypertension: Secondary | ICD-10-CM | POA: Diagnosis not present

## 2021-12-03 DIAGNOSIS — E7800 Pure hypercholesterolemia, unspecified: Secondary | ICD-10-CM | POA: Diagnosis not present

## 2021-12-03 DIAGNOSIS — E1169 Type 2 diabetes mellitus with other specified complication: Secondary | ICD-10-CM | POA: Diagnosis not present

## 2021-12-08 DIAGNOSIS — Z0000 Encounter for general adult medical examination without abnormal findings: Secondary | ICD-10-CM | POA: Diagnosis not present

## 2021-12-08 DIAGNOSIS — Q231 Congenital insufficiency of aortic valve: Secondary | ICD-10-CM | POA: Diagnosis not present

## 2021-12-08 DIAGNOSIS — E7800 Pure hypercholesterolemia, unspecified: Secondary | ICD-10-CM | POA: Diagnosis not present

## 2021-12-08 DIAGNOSIS — I10 Essential (primary) hypertension: Secondary | ICD-10-CM | POA: Diagnosis not present

## 2021-12-08 DIAGNOSIS — E1169 Type 2 diabetes mellitus with other specified complication: Secondary | ICD-10-CM | POA: Diagnosis not present

## 2021-12-08 DIAGNOSIS — D6859 Other primary thrombophilia: Secondary | ICD-10-CM | POA: Diagnosis not present

## 2021-12-08 DIAGNOSIS — Z125 Encounter for screening for malignant neoplasm of prostate: Secondary | ICD-10-CM | POA: Diagnosis not present

## 2022-01-06 ENCOUNTER — Ambulatory Visit: Payer: Medicare HMO | Admitting: Cardiology

## 2022-01-06 ENCOUNTER — Ambulatory Visit
Admission: RE | Admit: 2022-01-06 | Discharge: 2022-01-06 | Disposition: A | Discharge status: Home / Self Care | Payer: Medicare HMO | Source: Ambulatory Visit | Attending: Cardiothoracic Surgery | Admitting: Cardiothoracic Surgery

## 2022-01-06 ENCOUNTER — Ambulatory Visit: Payer: Medicare HMO | Admitting: Surgical

## 2022-01-06 VITALS — BP 150/80 | HR 56 | Resp 20 | Ht 71.0 in | Wt 195.0 lb

## 2022-01-06 DIAGNOSIS — J8410 Pulmonary fibrosis, unspecified: Secondary | ICD-10-CM | POA: Diagnosis not present

## 2022-01-06 DIAGNOSIS — Z953 Presence of xenogenic heart valve: Secondary | ICD-10-CM | POA: Diagnosis not present

## 2022-01-06 DIAGNOSIS — I7121 Aneurysm of the ascending aorta, without rupture: Secondary | ICD-10-CM | POA: Diagnosis not present

## 2022-01-06 DIAGNOSIS — I712 Thoracic aortic aneurysm, without rupture, unspecified: Secondary | ICD-10-CM | POA: Diagnosis not present

## 2022-01-06 DIAGNOSIS — K449 Diaphragmatic hernia without obstruction or gangrene: Secondary | ICD-10-CM | POA: Diagnosis not present

## 2022-01-06 DIAGNOSIS — I700 Atherosclerosis of aorta: Secondary | ICD-10-CM | POA: Diagnosis not present

## 2022-01-06 IMAGING — CT CT ANGIO CHEST
2 of 7 series · 16 of 36 positions shown · IV contrast (agent unspecified)
Comparison: [DATE]

CLINICAL DATA: Follow-up of thoracic aortic aneurysm. Prior aortic
valve repair.

EXAM:
CT ANGIOGRAPHY CHEST WITH CONTRAST
TECHNIQUE: Multidetector CT imaging of the chest was performed using the
standard protocol during bolus administration of intravenous
contrast. Multiplanar CT image reconstructions and MIPs were
obtained to evaluate the vascular anatomy.

[Series 9: cta thorax 1.00 bv36 s3 arterial thins · axial · arterial · 0.79mm/px · z∈[+1429,+1746]mm · 15 of 603 slices shown]
[im 38/603  lung]
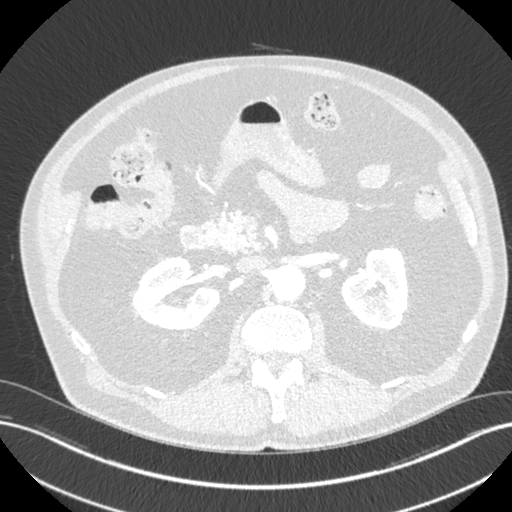
[im 76/603  mediastinal]
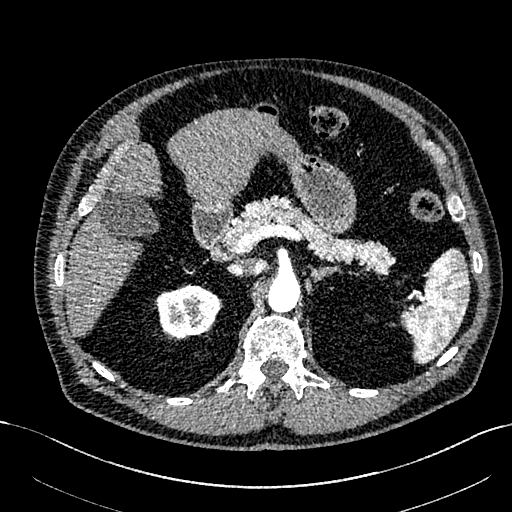
[im 113/603  lung]
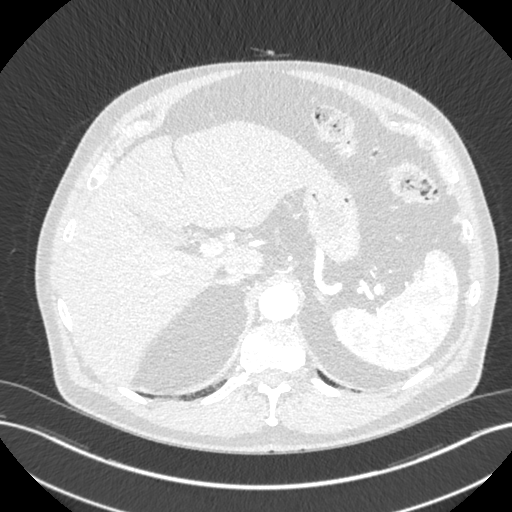
[im 151/603  mediastinal]
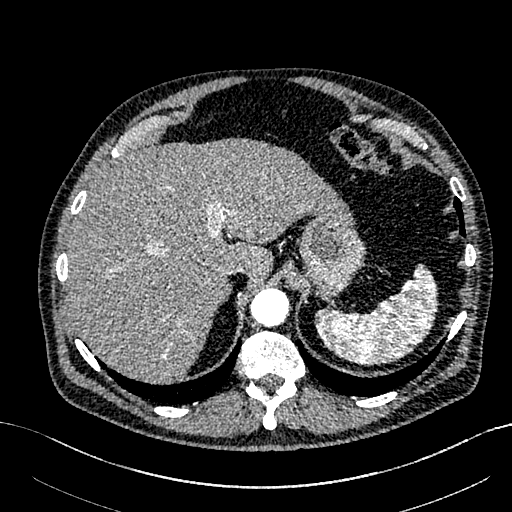
[im 189/603  lung]
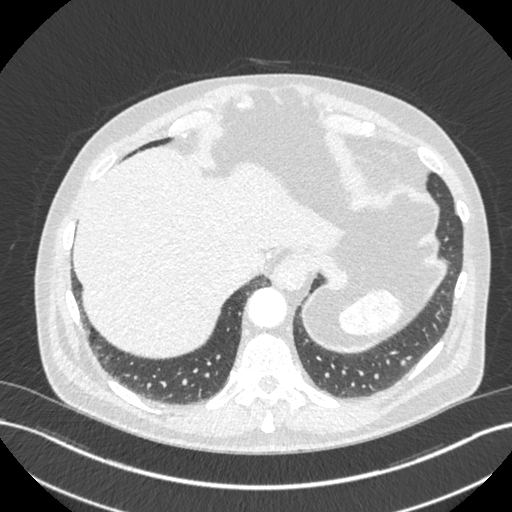
[im 226/603  mediastinal]
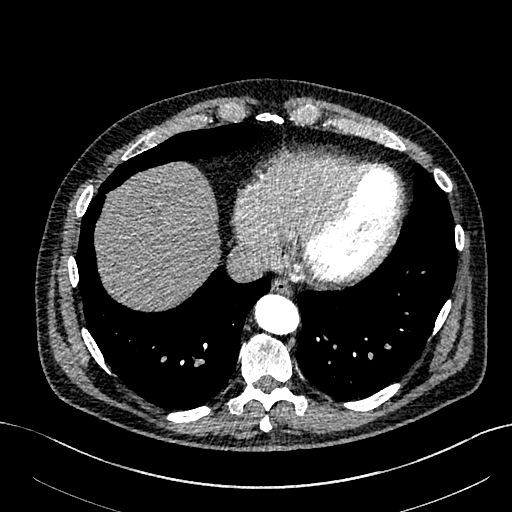
[im 264/603  lung]
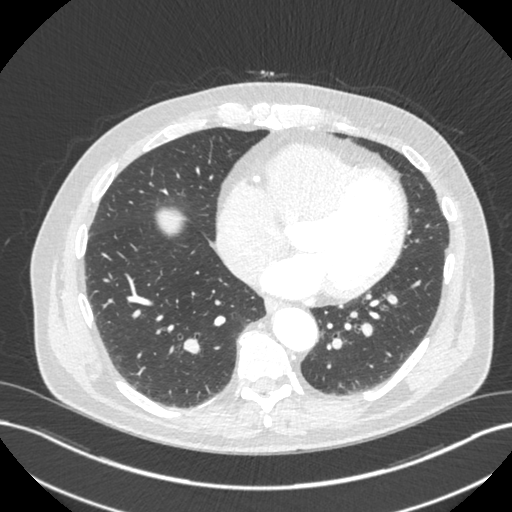
[im 302/603  mediastinal]
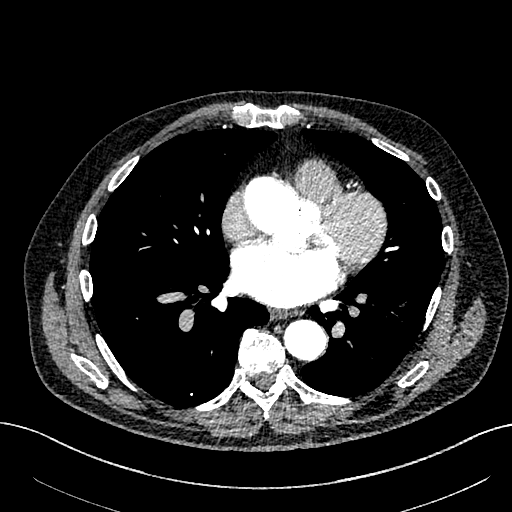
[im 339/603  lung]
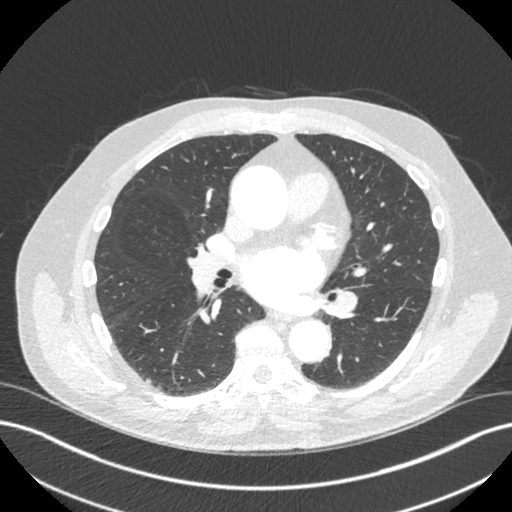
[im 377/603  mediastinal]
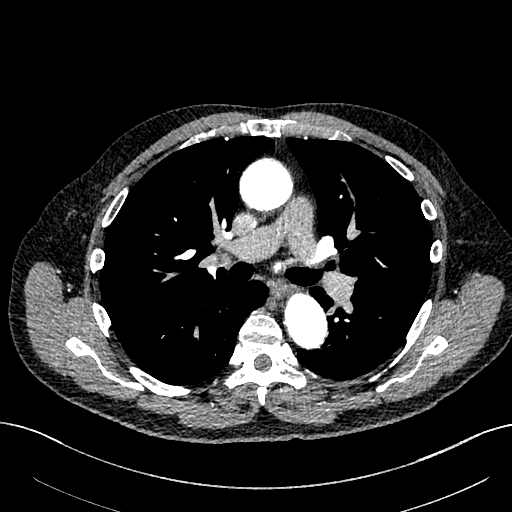
[im 414/603  lung]
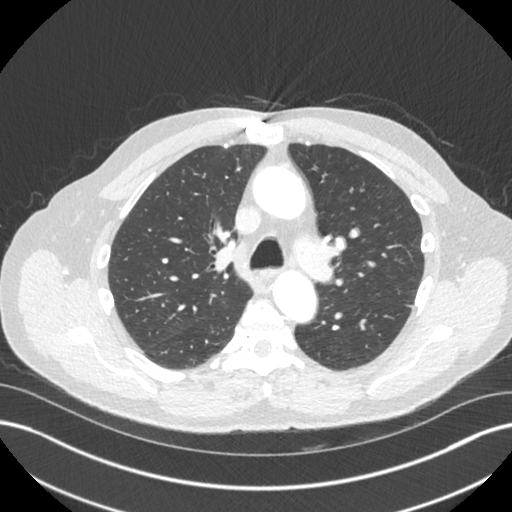
[im 452/603  mediastinal]
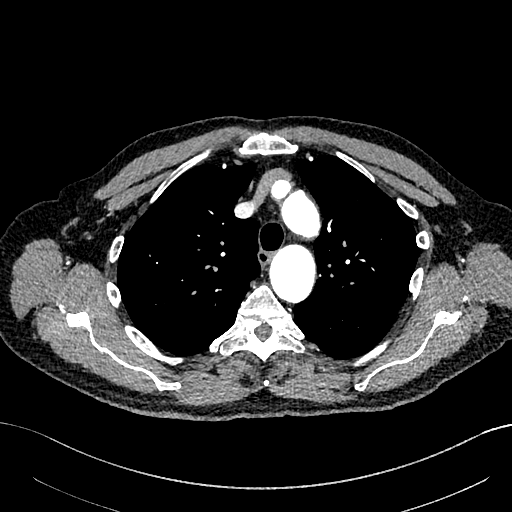
[im 490/603  lung]
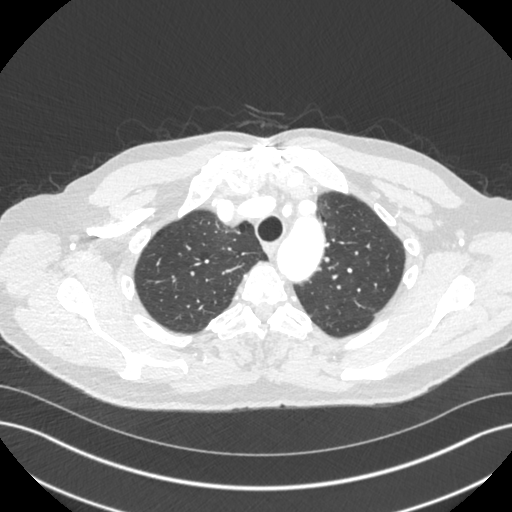
[im 527/603  mediastinal]
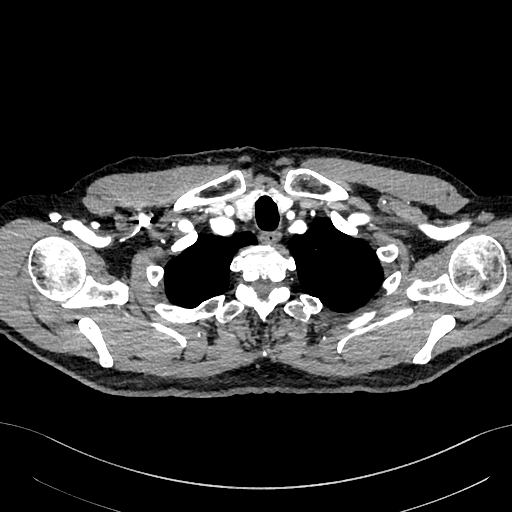
[im 565/603  lung]
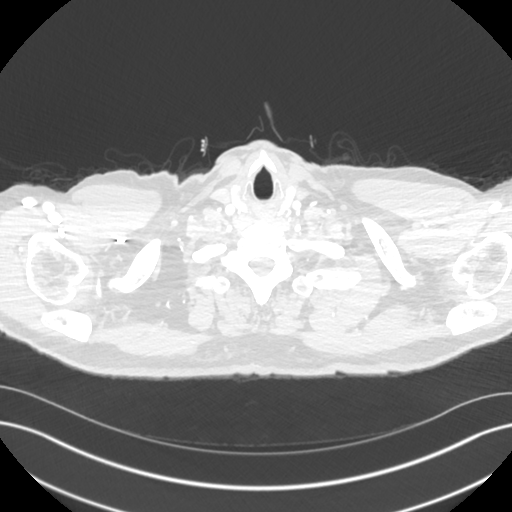

[Series 10: cta thorax 2.00 bv36 s3 cor st · coronal · 0.71mm/px · 1 of 169 slices shown]
[im 85/169  mediastinal]
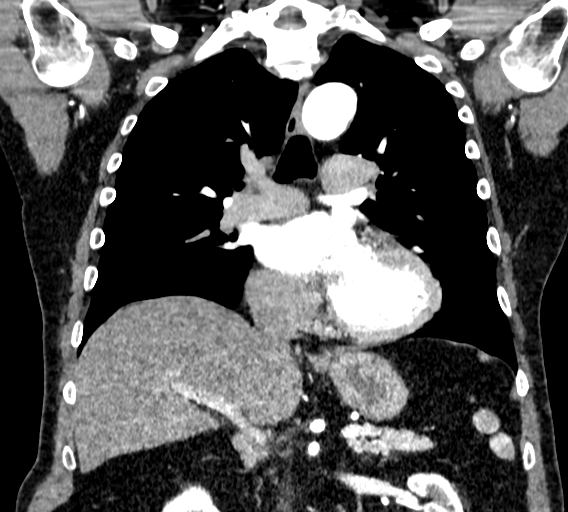

[16 of 36 positions shown; findings below may reference images not displayed]

RADIATION DOSE REDUCTION: This exam was performed according to the
departmental dose-optimization program which includes automated
exposure control, adjustment of the mA and/or kV according to
patient size and/or use of iterative reconstruction technique.

CONTRAST:  75mL [ZK] IOPAMIDOL ([ZK]) INJECTION 76%
FINDINGS: Cardiovascular: Status post aortic valve repair. The left-sided
sinus of Valsalva aneurysm is similar at 2.2 x 2.1 cm on 84/5 (when
remeasured).

The ascending aorta measures 3.6 cm at the level of the sinuses,
cm at the sinotubular junction, and 4.2 cm in the proximal ascending
segment, similar.

Normal transverse aortic caliber with patent normal caliber great
vessels.

The proximal descending aorta measures maximally 3.5 cm on sagittal
image 116 is not significantly changed. Tortuous descending thoracic
aorta.

Mild cardiomegaly. No pericardial effusion. Pulmonary arteries
poorly evaluated secondary to nondedicated bolus timing.

Mediastinum/Nodes: No mediastinal or hilar adenopathy. Tiny hiatal
hernia.

Lungs/Pleura: No pleural fluid. Right lower lobe calcified granuloma
on 91/7.

Clear left lung.

Upper Abdomen: Normal imaged portions of the liver, spleen,
pancreas, gallbladder, adrenal glands, kidneys. Accessory lower pole
right renal artery.

Musculoskeletal: No acute osseous abnormality.

Review of the MIP images confirms the above findings.
IMPRESSION: 1. Status post aortic valve repair. Similar appearance of mild
ascending aortic dilatation and focal aneurysmal dilatation of the
left sinus of Valsalva. Recommend annual imaging followup by CTA or
MRA. This recommendation follows [ZK]
ACCF/AHA/AATS/ACR/ASA/SCA/ROMNI/ROMNI/ROMNI/ROMNI Guidelines for the
Diagnosis and Management of Patients with Thoracic Aortic Disease.
Circulation. [ZK]; 121: [id number]-[id number]. Aortic aneurysm NOS ([ZK]-[ZK])
2.  Aortic Atherosclerosis ([ZK]-[ZK]).

## 2022-01-06 MED ORDER — IOPAMIDOL (ISOVUE-370) INJECTION 76%
75.0000 mL | Freq: Once | INTRAVENOUS | Status: AC | PRN
  Administered 2022-01-06: 75 mL via INTRAVENOUS

## 2022-01-06 NOTE — Progress Notes (Signed)
? ? ?Subjective:  ? ?  ?Patient ID: Stephen Mullins, male    DOB: 05-Oct-1945, 76 y.o.   MRN: [id number] ? ?Chief Complaint  ?Patient presents with  ? Thoracic Aortic Aneurysm  ?  18 Month f/u with CTA Chest  ? ? ?HPI ?Patient is in today for routine follow-up of his ascending aortic aneurysm.  Measurements remain stable.  He is asymptomatic in terms of cardiology symptoms.  He specifically denies chest pain or shortness of breath.  Having any dizziness or lightheadedness.  He is not having palpitations, lower extremity edema or DOE.  He did have a carotid stent placed following a stroke last year by Dr. Estanislado Pandy.  He is fully recovered from this with the exception of some left eye visual impairment..  He does have some coronary artery disease on recent calcium study and is followed by Dr. Marlou Porch of cardiology.  He does not have angina.  He is being treated aggressively for hypercholesterolemia. ? ? ? ?   ?Objective:  ?  ?BP (!) 150/80   Pulse (!) 56   Resp 20   Ht 5\' 11"  (1.803 m)   Wt 195 lb (88.5 kg)   SpO2 96% Comment: RA  BMI 27.20 kg/m?  ?BP Readings from Last 3 Encounters:  ?01/06/22 (!) 150/80  ?11/19/21 (!) 142/82  ?11/06/21 137/73  ? ? ?Physical Exam ?Constitutional:   ?   Appearance: Normal appearance. He is normal weight.  ?HENT:  ?   Head: Normocephalic and atraumatic.  ?Cardiovascular:  ?   Rate and Rhythm: Normal rate and regular rhythm.  ?   Heart sounds: Murmur heard.  ?  No gallop.  ?Pulmonary:  ?   Effort: Pulmonary effort is normal.  ?   Breath sounds: Normal breath sounds.  ?Skin: ?   General: Skin is warm and dry.  ?Neurological:  ?   Mental Status: He is alert.  ? ? ?No results found for any visits on 01/06/22. ? ? ?   ?Assessment & Plan:  ? ?Problem List Items Addressed This Visit   ? ? 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 ?Other Visit Diagnoses   ? ? Aneurysm of ascending aorta without rupture (HCC)    -  Primary  ? ?  ? ?Narrative & Impression  ?CLINICAL  DATA:  Follow-up of thoracic aortic aneurysm. Prior aortic ?valve repair. ?  ?EXAM: ?CT ANGIOGRAPHY CHEST WITH CONTRAST ?  ?TECHNIQUE: ?Multidetector CT imaging of the chest was performed using the ?standard protocol during bolus administration of intravenous ?contrast. Multiplanar CT image reconstructions and MIPs were ?obtained to evaluate the vascular anatomy. ?  ?RADIATION DOSE REDUCTION: This exam was performed according to the ?departmental dose-optimization program which includes automated ?exposure control, adjustment of the mA and/or kV according to ?patient size and/or use of iterative reconstruction technique. ?  ?CONTRAST:  17mL ISOVUE-370 IOPAMIDOL (ISOVUE-370) INJECTION 76% ?  ?COMPARISON:  06/26/2020 ?  ?FINDINGS: ?Cardiovascular: Status post aortic valve repair. 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 ?  ?The ascending aorta measures 3.6 cm at the level of the sinuses, 3.0 ?cm at the sinotubular junction, and 4.2 cm in the proximal ascending ?segment, similar. ?  ?Normal transverse aortic caliber with patent normal caliber great ?vessels. ?  ?The proximal descending aorta measures maximally 3.5 cm on sagittal ?image 116 is not significantly changed. Tortuous descending thoracic ?aorta. ?  ?Mild cardiomegaly. No pericardial effusion.  Pulmonary arteries ?poorly evaluated secondary to nondedicated bolus timing. ?  ?Mediastinum/Nodes: No mediastinal or hilar adenopathy. Tiny hiatal ?hernia. ?  ?Lungs/Pleura: No pleural fluid. Right lower lobe calcified granuloma ?on 91/7. ?  ?Clear left lung. ?  ?Upper Abdomen: Normal imaged portions of the liver, spleen, ?pancreas, gallbladder, adrenal glands, kidneys. Accessory lower pole ?right renal artery. ?  ?Musculoskeletal: No acute osseous abnormality. ?  ?Review of the MIP images confirms the above findings. ?  ?IMPRESSION: ?1. Status post aortic valve repair. 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 Recommend annual imaging followup by CTA or ?MRA. This recommendation follows 2010 ?ACCF/AHA/AATS/ACR/ASA/SCA/SCAI/SIR/STS/SVM Guidelines for the ?Diagnosis and Management of Patients with Thoracic Aortic Disease. ?Circulation. 2010; [id number]. Aortic aneurysm NOS (ICD10-I71.9) ?2.  Aortic Atherosclerosis (ICD10-I70.0). ?  ?  ?Electronically Signed ?  By: Abigail Miyamoto M.D. ?  On: 01/06/2022 12:46  ? ? ? ?A/P: Stable findings on scan for thoracic aortic aneurysm.  We will continue yearly surveillance with CTA of the chest.  He will continue lifestyle management as able.  He reports his blood pressure is typically better than observed but a encouraged him to continue to follow that closely with a blood pressure diary for his primary care physician and cardiologist to help manage blood pressure risks.  We also discussed routine lifestyle and nutrition recommendations. ? ? ?No follow-ups on file. ? ?John Giovanni, PA-C ? ? ?

## 2022-01-06 NOTE — Patient Instructions (Signed)
Routine activity and lifestyle modification were discussed ?

## 2022-01-09 ENCOUNTER — Other Ambulatory Visit: Payer: Self-pay | Admitting: Cardiology

## 2022-01-12 DIAGNOSIS — I480 Paroxysmal atrial fibrillation: Secondary | ICD-10-CM | POA: Diagnosis not present

## 2022-01-12 DIAGNOSIS — I10 Essential (primary) hypertension: Secondary | ICD-10-CM | POA: Diagnosis not present

## 2022-01-12 DIAGNOSIS — E1169 Type 2 diabetes mellitus with other specified complication: Secondary | ICD-10-CM | POA: Diagnosis not present

## 2022-01-12 DIAGNOSIS — E7800 Pure hypercholesterolemia, unspecified: Secondary | ICD-10-CM | POA: Diagnosis not present

## 2022-02-19 ENCOUNTER — Ambulatory Visit: Payer: Medicare HMO | Admitting: Podiatry

## 2022-03-05 ENCOUNTER — Ambulatory Visit: Payer: Medicare HMO | Admitting: Podiatry

## 2022-03-05 DIAGNOSIS — B351 Tinea unguium: Secondary | ICD-10-CM | POA: Diagnosis not present

## 2022-03-05 DIAGNOSIS — Z79899 Other long term (current) drug therapy: Secondary | ICD-10-CM

## 2022-03-05 NOTE — Patient Instructions (Signed)
Terbinafine Tablets What is this medication? TERBINAFINE (TER bin a feen) treats fungal infections of the nails. It belongs to a group of medications called antifungals. It will not treat infections caused by bacteria or viruses. This medicine may be used for other purposes; ask your health care provider or pharmacist if you have questions. COMMON BRAND NAME(S): Lamisil, Terbinex What should I tell my care team before I take this medication? They need to know if you have any of these conditions: Liver disease An unusual or allergic reaction to terbinafine, other medications, foods, dyes, or preservatives Pregnant or trying to get pregnant Breast-feeding How should I use this medication? Take this medication by mouth with water. Take it as directed on the prescription label at the same time every day. You can take it with or without food. If it upsets your stomach, take it with food. Keep taking it unless your care team tells you to stop. A special MedGuide will be given to you by the pharmacist with each prescription and refill. Be sure to read this information carefully each time. Talk to your care team regarding the use of this medication in children. Special care may be needed. Overdosage: If you think you have taken too much of this medicine contact a poison control center or emergency room at once. NOTE: This medicine is only for you. Do not share this medicine with others. What if I miss a dose? If you miss a dose, take it as soon as you can unless it is more than 4 hours late. If it is more than 4 hours late, skip the missed dose. Take the next dose at the normal time. What may interact with this medication? Do not take this medication with any of the following: Pimozide Thioridazine This medication may also interact with the following: Beta blockers Caffeine Certain medications for mental health conditions Cimetidine Cyclosporine Medications for fungal infections like fluconazole  and ketoconazole Medications for irregular heartbeat like amiodarone, flecainide and propafenone Rifampin Warfarin This list may not describe all possible interactions. Give your health care provider a list of all the medicines, herbs, non-prescription drugs, or dietary supplements you use. Also tell them if you smoke, drink alcohol, or use illegal drugs. Some items may interact with your medicine. What should I watch for while using this medication? Visit your care team for regular checks on your progress. You may need blood work while you are taking this medication. It may be some time before you see the benefit from this medication. This medication may cause serious skin reactions. They can happen weeks to months after starting the medication. Contact your care team right away if you notice fevers or flu-like symptoms with a rash. The rash may be red or purple and then turn into blisters or peeling of the skin. Or, you might notice a red rash with swelling of the face, lips or lymph nodes in your neck or under your arms. This medication can make you more sensitive to the sun. Keep out of the sun, If you cannot avoid being in the sun, wear protective clothing and sunscreen. Do not use sun lamps or tanning beds/booths. What side effects may I notice from receiving this medication? Side effects that you should report to your care team as soon as possible: Allergic reactions--skin rash, itching, hives, swelling of the face, lips, tongue, or throat Change in sense of smell Change in taste Infection--fever, chills, cough, or sore throat Liver injury--right upper belly pain, loss of appetite, nausea,   light-colored stool, dark yellow or brown urine, yellowing skin or eyes, unusual weakness or fatigue Low red blood cell level--unusual weakness or fatigue, dizziness, headache, trouble breathing Lupus-like syndrome--joint pain, swelling, or stiffness, butterfly-shaped rash on the face, rashes that get worse  in the sun, fever, unusual weakness or fatigue Rash, fever, and swollen lymph nodes Redness, blistering, peeling, or loosening of the skin, including inside the mouth Unusual bruising or bleeding Worsening mood, feelings of depression Side effects that usually do not require medical attention (report to your care team if they continue or are bothersome): Diarrhea Gas Headache Nausea Stomach pain Upset stomach This list may not describe all possible side effects. Call your doctor for medical advice about side effects. You may report side effects to FDA at 1-800-FDA-1088. Where should I keep my medication? Keep out of the reach of children and pets. Store between 20 and 25 degrees C (68 and 77 degrees F). Protect from light. Get rid of any unused medication after the expiration date. To get rid of medications that are no longer needed or have expired: Take the medication to a medication take-back program. Check with your pharmacy or law enforcement to find a location. If you cannot return the medication, check the label or package insert to see if the medication should be thrown out in the garbage or flushed down the toilet. If you are not sure, ask your care team. If it is safe to put it in the trash, take the medication out of the container. Mix the medication with cat litter, dirt, coffee grounds, or other unwanted substance. Seal the mixture in a bag or container. Put it in the trash. NOTE: This sheet is a summary. It may not cover all possible information. If you have questions about this medicine, talk to your doctor, pharmacist, or health care provider.  2023 Elsevier/Gold Standard (2021-04-09 00:00:00)  

## 2022-03-06 LAB — HEPATIC FUNCTION PANEL
ALT: 18 IU/L (ref 0–44)
AST: 18 IU/L (ref 0–40)
Albumin: 4.5 g/dL (ref 3.7–4.7)
Alkaline Phosphatase: 65 IU/L (ref 44–121)
Bilirubin Total: 0.3 mg/dL (ref 0.0–1.2)
Bilirubin, Direct: 0.12 mg/dL (ref 0.00–0.40)
Total Protein: 7 g/dL (ref 6.0–8.5)

## 2022-03-06 LAB — CBC WITH DIFFERENTIAL/PLATELET
Basophils Absolute: 0.1 10*3/uL (ref 0.0–0.2)
Basos: 1 %
EOS (ABSOLUTE): 0.2 10*3/uL (ref 0.0–0.4)
Eos: 3 %
Hematocrit: 45.4 % (ref 37.5–51.0)
Hemoglobin: 15.9 g/dL (ref 13.0–17.7)
Immature Grans (Abs): 0 10*3/uL (ref 0.0–0.1)
Immature Granulocytes: 0 %
Lymphocytes Absolute: 1.9 10*3/uL (ref 0.7–3.1)
Lymphs: 24 %
MCH: 30.5 pg (ref 26.6–33.0)
MCHC: 35 g/dL (ref 31.5–35.7)
MCV: 87 fL (ref 79–97)
Monocytes Absolute: 0.6 10*3/uL (ref 0.1–0.9)
Monocytes: 8 %
Neutrophils Absolute: 5 10*3/uL (ref 1.4–7.0)
Neutrophils: 64 %
Platelets: 239 10*3/uL (ref 150–450)
RBC: 5.22 x10E6/uL (ref 4.14–5.80)
RDW: 12.4 % (ref 11.6–15.4)
WBC: 7.8 10*3/uL (ref 3.4–10.8)

## 2022-03-07 ENCOUNTER — Other Ambulatory Visit: Payer: Self-pay | Admitting: Podiatry

## 2022-03-07 DIAGNOSIS — Z79899 Other long term (current) drug therapy: Secondary | ICD-10-CM

## 2022-03-07 MED ORDER — TERBINAFINE HCL 250 MG PO TABS: 250.0000 mg | ORAL_TABLET | Freq: Every day | ORAL | 0 refills | Status: DC

## 2022-03-08 NOTE — Progress Notes (Signed)
Subjective: 76 year old male presents the office today for follow-up evaluation of onychomycosis.  States he is doing about the same.  No swelling redness or any drainage.  No open lesions.  No other concerns.  He has been using the topical medication.    Objective: AAO x3, NAD DP/PT pulses palpable bilaterally, CRT less than 3 seconds The nails most affected today are the hallux toenails as well as the right third digit toenail which is still the case today.  There is still yellow, brown discoloration but there is some slight clear on the proximal nail fold with minimal improvement.  Patient will think over the last appointment.  There is no edema, erythema or signs of infection.  There is no open lesions.   No pain with calf compression.  Assessment: Onychomycosis  Plan: -All treatment options discussed with the patient including all alternatives, risks, complications.  -We discussed several treatment options including continue with medication versus nail removal.  Also left knee removal.  Discussed going back to Lamisil as he did well with this previously.  He wants to try this again.  We will check a CBC and LFT prior to starting medication.  Monitor for any side effects.  He has tolerated well previously.  Vivi Barrack DPM

## 2022-03-31 DIAGNOSIS — H35373 Puckering of macula, bilateral: Secondary | ICD-10-CM | POA: Diagnosis not present

## 2022-04-16 DIAGNOSIS — Z79899 Other long term (current) drug therapy: Secondary | ICD-10-CM | POA: Diagnosis not present

## 2022-04-17 ENCOUNTER — Other Ambulatory Visit: Payer: Self-pay | Admitting: Podiatry

## 2022-04-17 DIAGNOSIS — Z79899 Other long term (current) drug therapy: Secondary | ICD-10-CM

## 2022-04-17 LAB — CBC WITH DIFFERENTIAL/PLATELET
Basophils Absolute: 0.1 10*3/uL (ref 0.0–0.2)
Basos: 1 %
EOS (ABSOLUTE): 0.2 10*3/uL (ref 0.0–0.4)
Eos: 2 %
Hematocrit: 44.8 % (ref 37.5–51.0)
Hemoglobin: 15.3 g/dL (ref 13.0–17.7)
Immature Grans (Abs): 0 10*3/uL (ref 0.0–0.1)
Immature Granulocytes: 0 %
Lymphocytes Absolute: 1.7 10*3/uL (ref 0.7–3.1)
Lymphs: 22 %
MCH: 30.1 pg (ref 26.6–33.0)
MCHC: 34.2 g/dL (ref 31.5–35.7)
MCV: 88 fL (ref 79–97)
Monocytes Absolute: 0.7 10*3/uL (ref 0.1–0.9)
Monocytes: 9 %
Neutrophils Absolute: 5.1 10*3/uL (ref 1.4–7.0)
Neutrophils: 66 %
Platelets: 244 10*3/uL (ref 150–450)
RBC: 5.09 x10E6/uL (ref 4.14–5.80)
RDW: 12.7 % (ref 11.6–15.4)
WBC: 7.8 10*3/uL (ref 3.4–10.8)

## 2022-04-17 LAB — HEPATIC FUNCTION PANEL
ALT: 22 IU/L (ref 0–44)
AST: 19 IU/L (ref 0–40)
Albumin: 4.4 g/dL (ref 3.8–4.8)
Alkaline Phosphatase: 56 IU/L (ref 44–121)
Bilirubin Total: 0.5 mg/dL (ref 0.0–1.2)
Bilirubin, Direct: 0.16 mg/dL (ref 0.00–0.40)
Total Protein: 6.8 g/dL (ref 6.0–8.5)

## 2022-04-17 MED ORDER — TERBINAFINE HCL 250 MG PO TABS: 250.0000 mg | ORAL_TABLET | Freq: Every day | ORAL | 0 refills | Status: DC

## 2022-05-05 DIAGNOSIS — L720 Epidermal cyst: Secondary | ICD-10-CM | POA: Diagnosis not present

## 2022-06-02 ENCOUNTER — Other Ambulatory Visit (HOSPITAL_COMMUNITY): Payer: Self-pay | Admitting: Interventional Radiology

## 2022-06-02 ENCOUNTER — Encounter: Payer: Self-pay | Admitting: Podiatry

## 2022-06-02 DIAGNOSIS — I771 Stricture of artery: Secondary | ICD-10-CM

## 2022-06-02 DIAGNOSIS — Z79899 Other long term (current) drug therapy: Secondary | ICD-10-CM | POA: Diagnosis not present

## 2022-06-03 LAB — CBC WITH DIFFERENTIAL/PLATELET
Basophils Absolute: 0.1 10*3/uL (ref 0.0–0.2)
Basos: 1 %
EOS (ABSOLUTE): 0.2 10*3/uL (ref 0.0–0.4)
Eos: 2 %
Hematocrit: 47.9 % (ref 37.5–51.0)
Hemoglobin: 16.1 g/dL (ref 13.0–17.7)
Immature Grans (Abs): 0 10*3/uL (ref 0.0–0.1)
Immature Granulocytes: 0 %
Lymphocytes Absolute: 1.6 10*3/uL (ref 0.7–3.1)
Lymphs: 23 %
MCH: 30 pg (ref 26.6–33.0)
MCHC: 33.6 g/dL (ref 31.5–35.7)
MCV: 89 fL (ref 79–97)
Monocytes Absolute: 0.4 10*3/uL (ref 0.1–0.9)
Monocytes: 6 %
Neutrophils Absolute: 4.6 10*3/uL (ref 1.4–7.0)
Neutrophils: 68 %
Platelets: 243 10*3/uL (ref 150–450)
RBC: 5.37 x10E6/uL (ref 4.14–5.80)
RDW: 12.6 % (ref 11.6–15.4)
WBC: 6.8 10*3/uL (ref 3.4–10.8)

## 2022-06-03 LAB — HEPATIC FUNCTION PANEL
ALT: 24 IU/L (ref 0–44)
AST: 22 IU/L (ref 0–40)
Albumin: 4.4 g/dL (ref 3.8–4.8)
Alkaline Phosphatase: 53 IU/L (ref 44–121)
Bilirubin Total: 0.4 mg/dL (ref 0.0–1.2)
Bilirubin, Direct: 0.14 mg/dL (ref 0.00–0.40)
Total Protein: 6.9 g/dL (ref 6.0–8.5)

## 2022-06-04 ENCOUNTER — Other Ambulatory Visit: Payer: Self-pay | Admitting: Podiatry

## 2022-06-04 MED ORDER — FLUCONAZOLE 150 MG PO TABS: 150.0000 mg | ORAL_TABLET | ORAL | 0 refills | Status: DC

## 2022-06-04 NOTE — Progress Notes (Signed)
Fluconazole ordered

## 2022-06-12 ENCOUNTER — Other Ambulatory Visit: Payer: Self-pay | Admitting: Cardiology

## 2022-06-15 ENCOUNTER — Ambulatory Visit (HOSPITAL_COMMUNITY)
Admission: RE | Admit: 2022-06-15 | Discharge: 2022-06-15 | Disposition: A | Discharge status: Home / Self Care | Payer: Medicare HMO | Source: Ambulatory Visit | Attending: Interventional Radiology | Admitting: Interventional Radiology

## 2022-06-15 ENCOUNTER — Telehealth: Payer: Self-pay | Admitting: Podiatry

## 2022-06-15 DIAGNOSIS — I771 Stricture of artery: Secondary | ICD-10-CM | POA: Insufficient documentation

## 2022-06-15 NOTE — Progress Notes (Signed)
Carotid duplex has been completed.   Preliminary results in CV Proc.   Pedram Goodchild Tylan Kinn 06/15/2022 10:10 AM

## 2022-06-15 NOTE — Telephone Encounter (Signed)
Can you schedule a follow-up? We can do a virtual visit for this.

## 2022-06-15 NOTE — Telephone Encounter (Signed)
Pt would like to setup a virtual appt. What day and time would this need to be scheduled?  Please advise

## 2022-06-16 ENCOUNTER — Ambulatory Visit (INDEPENDENT_AMBULATORY_CARE_PROVIDER_SITE_OTHER): Payer: Medicare HMO | Admitting: Podiatry

## 2022-06-16 DIAGNOSIS — B351 Tinea unguium: Secondary | ICD-10-CM | POA: Diagnosis not present

## 2022-06-16 NOTE — Progress Notes (Addendum)
I connected with  [name] on 06/16/22 by a video enabled telemedicine application and verified that I am speaking with the correct person using two identifiers.   I discussed the limitations of evaluation and management by telemedicine. The patient expressed understanding and agreed to proceed.  Patient: Home Provider: Office  Subjective: 76 year old male has concerns about starting fluconazole as he does have a history of heart arrhythmia.  He is attempted numerous treatments including Lamisil, topical medications without significant resolution of the nail discoloration.  Objective: States the nails unchanged  Assessment: Onychomycosis  Plan: We discussed that since he has the arrhythmia to hold off on the fluconazole.  Recommend holding off on any further oral medications at this time.  He is attempted numerous treatments we discussed other topicals that we could try or switching to urea nail gel which I would recommend to help.  Also regular debridement of the nail can be helpful.  He denies any rest options and let me know.  6 minutes spent with the patinet

## 2022-06-17 ENCOUNTER — Telehealth (HOSPITAL_COMMUNITY): Payer: Self-pay

## 2022-06-17 NOTE — Telephone Encounter (Signed)
Called pt regarding recent imaging, no answer, left vm. AW  

## 2022-06-23 DIAGNOSIS — D6859 Other primary thrombophilia: Secondary | ICD-10-CM | POA: Diagnosis not present

## 2022-06-23 DIAGNOSIS — I480 Paroxysmal atrial fibrillation: Secondary | ICD-10-CM | POA: Diagnosis not present

## 2022-06-23 DIAGNOSIS — E1169 Type 2 diabetes mellitus with other specified complication: Secondary | ICD-10-CM | POA: Diagnosis not present

## 2022-06-23 DIAGNOSIS — E7800 Pure hypercholesterolemia, unspecified: Secondary | ICD-10-CM | POA: Diagnosis not present

## 2022-06-23 DIAGNOSIS — I10 Essential (primary) hypertension: Secondary | ICD-10-CM | POA: Diagnosis not present

## 2022-06-23 DIAGNOSIS — Z6827 Body mass index (BMI) 27.0-27.9, adult: Secondary | ICD-10-CM | POA: Diagnosis not present

## 2022-06-24 ENCOUNTER — Telehealth (HOSPITAL_COMMUNITY): Payer: Self-pay

## 2022-06-24 NOTE — Telephone Encounter (Signed)
Pt agreed to f/u in 6 months with a us carotid. AW 

## 2022-10-08 ENCOUNTER — Other Ambulatory Visit: Payer: Self-pay | Admitting: Cardiology

## 2022-11-30 DIAGNOSIS — L821 Other seborrheic keratosis: Secondary | ICD-10-CM | POA: Diagnosis not present

## 2022-12-09 ENCOUNTER — Other Ambulatory Visit: Payer: Self-pay | Admitting: Cardiology

## 2022-12-15 DIAGNOSIS — Z1389 Encounter for screening for other disorder: Secondary | ICD-10-CM | POA: Diagnosis not present

## 2022-12-15 DIAGNOSIS — D6859 Other primary thrombophilia: Secondary | ICD-10-CM | POA: Diagnosis not present

## 2022-12-15 DIAGNOSIS — E7800 Pure hypercholesterolemia, unspecified: Secondary | ICD-10-CM | POA: Diagnosis not present

## 2022-12-15 DIAGNOSIS — I480 Paroxysmal atrial fibrillation: Secondary | ICD-10-CM | POA: Diagnosis not present

## 2022-12-15 DIAGNOSIS — Z0000 Encounter for general adult medical examination without abnormal findings: Secondary | ICD-10-CM | POA: Diagnosis not present

## 2022-12-15 DIAGNOSIS — R978 Other abnormal tumor markers: Secondary | ICD-10-CM | POA: Diagnosis not present

## 2022-12-15 DIAGNOSIS — I10 Essential (primary) hypertension: Secondary | ICD-10-CM | POA: Diagnosis not present

## 2022-12-15 DIAGNOSIS — E1169 Type 2 diabetes mellitus with other specified complication: Secondary | ICD-10-CM | POA: Diagnosis not present

## 2022-12-15 DIAGNOSIS — Z125 Encounter for screening for malignant neoplasm of prostate: Secondary | ICD-10-CM | POA: Diagnosis not present

## 2022-12-16 ENCOUNTER — Other Ambulatory Visit: Payer: Self-pay | Admitting: Cardiothoracic Surgery

## 2022-12-16 DIAGNOSIS — I7121 Aneurysm of the ascending aorta, without rupture: Secondary | ICD-10-CM

## 2022-12-22 DIAGNOSIS — D6859 Other primary thrombophilia: Secondary | ICD-10-CM | POA: Diagnosis not present

## 2022-12-22 DIAGNOSIS — I700 Atherosclerosis of aorta: Secondary | ICD-10-CM | POA: Diagnosis not present

## 2022-12-22 DIAGNOSIS — I7121 Aneurysm of the ascending aorta, without rupture: Secondary | ICD-10-CM | POA: Diagnosis not present

## 2022-12-22 DIAGNOSIS — E7800 Pure hypercholesterolemia, unspecified: Secondary | ICD-10-CM | POA: Diagnosis not present

## 2022-12-22 DIAGNOSIS — Z0000 Encounter for general adult medical examination without abnormal findings: Secondary | ICD-10-CM | POA: Diagnosis not present

## 2022-12-22 DIAGNOSIS — I480 Paroxysmal atrial fibrillation: Secondary | ICD-10-CM | POA: Diagnosis not present

## 2022-12-22 DIAGNOSIS — E119 Type 2 diabetes mellitus without complications: Secondary | ICD-10-CM | POA: Diagnosis not present

## 2022-12-22 DIAGNOSIS — I10 Essential (primary) hypertension: Secondary | ICD-10-CM | POA: Diagnosis not present

## 2022-12-30 ENCOUNTER — Other Ambulatory Visit (HOSPITAL_COMMUNITY): Payer: Self-pay | Admitting: Interventional Radiology

## 2022-12-30 DIAGNOSIS — I771 Stricture of artery: Secondary | ICD-10-CM

## 2023-01-06 ENCOUNTER — Ambulatory Visit (HOSPITAL_COMMUNITY)
Admission: RE | Admit: 2023-01-06 | Discharge: 2023-01-06 | Disposition: A | Discharge status: Home / Self Care | Payer: Medicare HMO | Source: Ambulatory Visit | Attending: Interventional Radiology | Admitting: Interventional Radiology

## 2023-01-06 DIAGNOSIS — I771 Stricture of artery: Secondary | ICD-10-CM | POA: Diagnosis not present

## 2023-01-08 ENCOUNTER — Other Ambulatory Visit: Payer: Self-pay | Admitting: Cardiology

## 2023-01-11 ENCOUNTER — Telehealth (HOSPITAL_COMMUNITY): Payer: Self-pay

## 2023-01-11 NOTE — Telephone Encounter (Signed)
Pt agreed to f/u in 6 months with a us carotid. AB  

## 2023-01-11 NOTE — Progress Notes (Unsigned)
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 [name] on December 20, 2017. 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 He was last seen by [facility] in May 2023 and the ATAA was 4.2 cm. He does have CAD and is followed by Dr. Anne Fu. He denies chest pain, pressure, tightness, or LE edema.  Past Medical History:  Diagnosis Date   Bicuspid aortic valve    with aortic stenosis and mild insufficiency, mild to moderate aortic root dilitation   Borderline hyperlipidemia    Diabetes mellitus without complication (HCC)    Elevated fasting glucose    Elevated PSA    due to prostatitis   GERD (gastroesophageal reflux disease)    Heart murmur    Hypertension    Nail fungus    Thoracic ascending aortic aneurysm [facility]     Past Surgical History:  Procedure Laterality Date   AORTIC VALVE REPLACEMENT N/A 12/20/2017   Procedure: AORTIC VALVE REPLACEMENT (AVR);  Surgeon: [name], [name], MD;  Location: [facility] OR;  Service: Open Heart Surgery;  Laterality: N/A;   CATARACT EXTRACTION, BILATERAL     IR ANGIO INTRA EXTRACRAN SEL COM CAROTID INNOMINATE BILAT MOD SED  03/26/2021   IR ANGIO INTRA EXTRACRAN SEL COM CAROTID INNOMINATE UNI L MOD SED  04/03/2021   IR ANGIO VERTEBRAL SEL SUBCLAVIAN INNOMINATE BILAT MOD SED  03/26/2021   IR CT HEAD LTD  03/28/2021   IR INTRAVSC STENT CERV CAROTID W/EMB-PROT MOD SED INCL ANGIO  03/28/2021   IR RADIOLOGIST EVAL & MGMT  04/15/2021   IR US GUIDE VASC ACCESS RIGHT  03/28/2021   RADIOLOGY WITH ANESTHESIA N/A 03/28/2021   Procedure: STENT PLACMENT;  Surgeon: Julieanne Cotton, MD;  Location: [facility] OR;  Service: Radiology;   Laterality: N/A;   RIGHT/LEFT HEART CATH AND CORONARY ANGIOGRAPHY N/A 11/09/2017   Procedure: RIGHT/LEFT HEART CATH AND CORONARY ANGIOGRAPHY;  Surgeon: [name], MD;  Location: [facility] INVASIVE CV LAB;  Service: Cardiovascular;  Laterality: N/A;   TEE WITHOUT CARDIOVERSION N/A 12/20/2017   Procedure: TRANSESOPHAGEAL ECHOCARDIOGRAM (TEE);  Surgeon: [name], [name], MD;  Location: [facility] OR;  Service: Open Heart Surgery;  Laterality: N/A;   Family History: Family History  Problem Relation Age of Onset   Prostate cancer Father     Social History Social History   Tobacco Use   Smoking status: Former    Types: Cigarettes    Quit date: 10/20/2001    Years since quitting: 21.2   Smokeless tobacco: Never  Vaping Use   Vaping Use: Never used  Substance Use Topics   Alcohol use: Yes    Alcohol/week: 1.0 - 2.0 standard drink of alcohol    Types: 1 - 2 Glasses of wine per week   Drug use: No    Current Outpatient Medications  Medication Sig Dispense Refill   acetaminophen (TYLENOL) 500 MG tablet Take 2 tablets (1,000 mg total) by mouth every 6 (six) hours as needed. 30 tablet 0   amoxicillin (AMOXIL) 500 MG capsule TAKE 4 TABLETS BY MOUTH  1 HOUR BEFORE DENTAL PROCEDURE 4 capsule 3   aspirin EC 81 MG tablet Take 81 mg by mouth in the morning. Swallow whole.     atorvastatin (LIPITOR) 40 MG tablet Take 1 tablet (40 mg total) by mouth in the morning. 30 tablet 0   clopidogrel (PLAVIX) 75 MG tablet Take 1 tablet (75 mg total) by mouth daily. Keep upcoming appt for # 90 day supply. 30 tablet 1   Efinaconazole 10 % SOLN Apply 1 drop topically daily. 4 mL 11   fluconazole (DIFLUCAN) 150 MG tablet Take 1 tablet (150 mg total) by mouth once a week. 12 tablet 0   hydrochlorothiazide (HYDRODIURIL) 12.5 MG tablet Take 1 tablet by mouth once daily 30 tablet 0   hydrocortisone cream 1 % Apply 1 application topically daily as needed for itching.     lisinopril (ZESTRIL) 5 MG tablet Take 2.5 mg by  mouth in the morning.     metoprolol tartrate (LOPRESSOR) 50 MG tablet Take 1 tablet (50 mg total) by mouth once for 1 dose. Take one tablet 2 hours before your CT scan 1 tablet 0   neomycin-bacitracin-polymyxin (NEOSPORIN) ointment Apply 1 application topically daily as needed for wound care.     oxymetazoline (AFRIN) 0.05 % nasal spray Place 1 spray into both nostrils daily as needed for congestion.     terbinafine (LAMISIL) 250 MG tablet Take 1 tablet (250 mg total) by mouth daily. 90 tablet 0   terbinafine (LAMISIL) 250 MG tablet Take 1 tablet (250 mg total) by mouth daily. 90 tablet 0   No current facility-administered medications for this visit.    Allergies  Allergen Reactions   Tetanus Toxoid, Adsorbed Other (See Comments)    Pt. States as small child he had tetnus made from horse serum and he turned 'blue' ?? SHORTNESS OF BREATH ??    Review of Systems:  Chest Pain [  ] Resting SOB [ ]  Exertional SOB [  ]  Pedal Edema [  ] Syncope [  ] Presyncope [  ]  General Review of Systems: [Y] = yes [ N]=no  Consitutional:   nausea [ ] ;  fever [ ] ;  Eye : blurred vision [ ] ; Amaurosis fugax[  ];  Resp: cough [ ] ;  hemoptysis[ ] ;  GI: vomiting[ ] ; melena[ ] ; hematochezia [] ;  WG:[id number] ; Musculoskeletal: myalgias[ ] ; joint swelling[  ]; joint erythema[ ] ;  Heme/Lymph: anemia[ ] ;  Neuro: TIA[ ] ;stroke[ ] ;  seizures[ ] ;  Endocrine: diabetes[ ] ;   Vital Signs:  Physical Exam: CV- Pulmonary- Neck- Abdomen- Extremities- Neurologic-   Diagnostic Tests:   Impression and Plan: Risk Modification in those with ascending thoracic aortic aneurysm:  Continue good control of blood pressure (prefer SBP 130/80 or less)-continue HCTZ an Lisinopril  2. Avoid fluoroquinolone antibiotics (I.e Ciprofloxacin, Avelox, Levofloxacin, Ofloxacin)  3.  Use of statin (to decrease cardiovascular risk)-Continue Atorvastatin  4.  Exercise and activity limitations is individualized, but in  general, contact sports are to be  avoided and one should avoid heavy lifting (defined as half of ideal body weight) and exercises involving sustained Valsalva maneuver.  5. Counseling for those suspected of having genetically mediated disease. First-degree relatives of those with TAA disease should be screened as well as those who have a connective tissue disease (I.e with Marfan syndrome, Ehlers-Danlos syndrome,  and Loeys-Dietz syndrome) or a  bicuspid aortic valve,have an increased risk for  complications related to TAA  6.He has a remote history of tobacco  abuse-quit 2003   Impression and Plan: CTA with a *** cm ascending aortic aneurysm.  Echocardiogram done in July 2022 showed a bioprosthetic aortic valve without evidence of regurgitation.  We discussed the natural history and and risk factors for growth of ascending aortic aneurysms.  We covered the importance of smoking cessation, tight blood pressure control, refraining from lifting heavy objects, and avoiding fluoroquinolones.  The patient is aware of signs and symptoms of aortic dissection and when to present to the emergency department.  We will continue surveillance and a repeat CTA was ordered for one year.    Ardelle Balls, PA-C [facility] [phone]

## 2023-01-14 ENCOUNTER — Encounter: Payer: Self-pay | Admitting: Cardiothoracic Surgery

## 2023-01-20 ENCOUNTER — Encounter: Payer: Self-pay | Admitting: Physician Assistant

## 2023-01-20 ENCOUNTER — Ambulatory Visit
Admission: RE | Admit: 2023-01-20 | Discharge: 2023-01-20 | Disposition: A | Discharge status: Home / Self Care | Payer: Medicare HMO | Source: Ambulatory Visit | Attending: Cardiothoracic Surgery | Admitting: Cardiothoracic Surgery

## 2023-01-20 ENCOUNTER — Ambulatory Visit: Payer: Medicare HMO | Admitting: Physician Assistant

## 2023-01-20 VITALS — BP 118/77 | HR 60 | Resp 20 | Ht 71.0 in | Wt 197.0 lb

## 2023-01-20 DIAGNOSIS — I7121 Aneurysm of the ascending aorta, without rupture: Secondary | ICD-10-CM | POA: Diagnosis not present

## 2023-01-20 DIAGNOSIS — I700 Atherosclerosis of aorta: Secondary | ICD-10-CM | POA: Diagnosis not present

## 2023-01-20 DIAGNOSIS — I719 Aortic aneurysm of unspecified site, without rupture: Secondary | ICD-10-CM | POA: Diagnosis not present

## 2023-01-20 MED ORDER — IOPAMIDOL (ISOVUE-370) INJECTION 76%
75.0000 mL | Freq: Once | INTRAVENOUS | Status: AC | PRN
  Administered 2023-01-20: 75 mL via INTRAVENOUS

## 2023-01-20 NOTE — Patient Instructions (Signed)
Risk Modification in those with ascending thoracic aortic aneurysm:  Continue good control of blood pressure (prefer SBP 130/80 or less)-continue HCTZ an Lisinopril  2. Avoid fluoroquinolone antibiotics (I.e Ciprofloxacin, Avelox, Levofloxacin, Ofloxacin)  3.  Use of statin (to decrease cardiovascular risk)-Continue Atorvastatin  4.  Exercise and activity limitations is individualized, but in general, contact sports are to be  avoided and one should avoid heavy lifting (defined as half of ideal body weight) and exercises involving sustained Valsalva maneuver.  5. Counseling for those suspected of having genetically mediated disease. First-degree relatives of those with TAA disease should be screened as well as those who have a connective tissue disease (I.e with Marfan syndrome, Ehlers-Danlos syndrome,  and Loeys-Dietz syndrome) or a  bicuspid aortic valve,have an increased risk for  complications related to TAA. He had severe AS and mild to moderate AI and underwent Ian  AVR (bioprosthetic) 2019. No family history of above.   6.He has a remote history of tobacco abuse-quit 2003

## 2023-01-26 DIAGNOSIS — R69 Illness, unspecified: Secondary | ICD-10-CM | POA: Diagnosis not present

## 2023-01-27 ENCOUNTER — Ambulatory Visit: Payer: Medicare HMO | Attending: Cardiology | Admitting: Cardiology

## 2023-01-27 ENCOUNTER — Encounter: Payer: Self-pay | Admitting: Cardiology

## 2023-01-27 VITALS — BP 126/78 | HR 73 | Ht 71.0 in | Wt 195.4 lb

## 2023-01-27 DIAGNOSIS — Z953 Presence of xenogenic heart valve: Secondary | ICD-10-CM | POA: Diagnosis not present

## 2023-01-27 DIAGNOSIS — I2510 Atherosclerotic heart disease of native coronary artery without angina pectoris: Secondary | ICD-10-CM | POA: Diagnosis not present

## 2023-01-27 DIAGNOSIS — E7800 Pure hypercholesterolemia, unspecified: Secondary | ICD-10-CM | POA: Diagnosis not present

## 2023-01-27 NOTE — Progress Notes (Signed)
Cardiology Office Note:    Date:  01/27/2023   ID:  Stephen Mullins, DOB 05/19/1946, MRN [id number]  PCP:  Soundra Pilon, FNP   [facility] Providers Cardiologist:  Donato Schultz, MD     Referring MD: Soundra Pilon, FNP    History of Present Illness:    Stephen Mullins is a 77 y.o. 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  Dilated ascending aorta 42 to 45 mm  At his last appointment we discussed abnormal GXT per MS. his treadmill showed marked ST segment depression diffusely with aVR elevation which is suggestive of multivessel coronary artery disease.  Interestingly in 2019 prior to his aortic valve replacement valve, he had mild nonobstructive disease noted on cardiac cath.  In the past he underwent an aortic valve replacement in April 2019 with a number 25 mm bioprosthetic valve.  LV function normal.  Dilated aorta. Doing well.   Previous EKG showed sinus rhythm with ventricular bigeminy heart rate 52 but effective heart rate likely in the 30s secondary to PVCs.  Had no dizziness no syncope.  Walking 1 to 2 miles a day with his wife weather permitting.  He was doing reasonably well. After his stress test on 03/18/21 he had no adverse effect. He never had negative symptoms when working outside or walking around his home. Otherwise he was tolerating his medication well.   At prior visit, he complained of shortness of breath. He was transitioned from Brilinta to Plavix. Carotid ultrasound revealed 1-39% stenosis in the right ICA and >50% stenosis in the left ECA.   Feels well without any chest pain or shortness of breath.  Past Medical History:  Diagnosis Date   Bicuspid aortic valve    with aortic stenosis and mild insufficiency, mild to moderate aortic root dilitation   Borderline  hyperlipidemia    Diabetes mellitus without complication (HCC)    Elevated fasting glucose    Elevated PSA    due to prostatitis   GERD (gastroesophageal reflux disease)    Heart murmur    Hypertension    Nail fungus    Thoracic ascending aortic aneurysm [facility])     Past Surgical History:  Procedure Laterality Date   AORTIC VALVE REPLACEMENT N/A 12/20/2017   Procedure: AORTIC VALVE REPLACEMENT (AVR);  Surgeon: [name], [name], MD;  Location: [facility] OR;  Service: Open Heart Surgery;  Laterality: N/A;   CATARACT EXTRACTION, BILATERAL     IR ANGIO INTRA EXTRACRAN SEL COM CAROTID INNOMINATE BILAT MOD SED  03/26/2021   IR ANGIO INTRA EXTRACRAN SEL COM CAROTID INNOMINATE UNI L MOD SED  04/03/2021   IR ANGIO VERTEBRAL SEL SUBCLAVIAN INNOMINATE BILAT MOD SED  03/26/2021   IR CT HEAD LTD  03/28/2021   IR INTRAVSC STENT CERV CAROTID W/EMB-PROT MOD SED INCL ANGIO  03/28/2021   IR RADIOLOGIST EVAL & MGMT  04/15/2021   IR US GUIDE VASC ACCESS RIGHT  03/28/2021   RADIOLOGY WITH ANESTHESIA N/A 03/28/2021   Procedure: STENT PLACMENT;  Surgeon: Julieanne Cotton, MD;  Location: [facility] OR;  Service: Radiology;  Laterality: N/A;   RIGHT/LEFT HEART CATH AND CORONARY ANGIOGRAPHY N/A 11/09/2017   Procedure: RIGHT/LEFT HEART CATH AND CORONARY ANGIOGRAPHY;  Surgeon: [name], MD;  Location: [facility] INVASIVE CV LAB;  Service: Cardiovascular;  Laterality: N/A;   TEE WITHOUT CARDIOVERSION N/A  12/20/2017   Procedure: TRANSESOPHAGEAL ECHOCARDIOGRAM (TEE);  Surgeon: [name], [name], MD;  Location: [facility] OR;  Service: Open Heart Surgery;  Laterality: N/A;    Current Medications: Current Meds  Medication Sig   acetaminophen (TYLENOL) 500 MG tablet Take 2 tablets (1,000 mg total) by mouth every 6 (six) hours as needed.   amoxicillin (AMOXIL) 500 MG capsule TAKE 4 TABLETS BY MOUTH 1 HOUR BEFORE DENTAL PROCEDURE   aspirin EC 81 MG tablet Take 81 mg by mouth in the morning. Swallow whole.   clopidogrel (PLAVIX) 75 MG tablet  Take 1 tablet (75 mg total) by mouth daily. Keep upcoming appt for # 90 day supply.   hydrochlorothiazide (HYDRODIURIL) 12.5 MG tablet Take 1 tablet by mouth once daily   hydrocortisone cream 1 % Apply 1 application topically daily as needed for itching.   lisinopril (ZESTRIL) 5 MG tablet Take 2.5 mg by mouth in the morning.   metformin (FORTAMET) 500 MG (OSM) 24 hr tablet Take 500 mg by mouth daily with breakfast.   neomycin-bacitracin-polymyxin (NEOSPORIN) ointment Apply 1 application topically daily as needed for wound care.   oxymetazoline (AFRIN) 0.05 % nasal spray Place 1 spray into both nostrils daily as needed for congestion.     Allergies:   Tetanus toxoid, adsorbed   Social History   Socioeconomic History   Marital status: Married    Spouse name: Not on file   Number of children: Not on file   Years of education: Not on file   Highest education level: Not on file  Occupational History   Occupation: retired  Tobacco Use   Smoking status: Former    Types: Cigarettes    Quit date: 10/20/2001    Years since quitting: 21.2   Smokeless tobacco: Never  Vaping Use   Vaping Use: Never used  Substance and Sexual Activity   Alcohol use: Yes    Alcohol/week: 1.0 - 2.0 standard drink of alcohol    Types: 1 - 2 Glasses of wine per week   Drug use: No   Sexual activity: Not on file  Other Topics Concern   Not on file  Social History Narrative   Married 55 yrs (as of July 2022). Enjoys golf.     Social Determinants of Health   Financial Resource Strain: Not on file  Food Insecurity: Not on file  Transportation Needs: Not on file  Physical Activity: Not on file  Stress: Not on file  Social Connections: Not on file     Family History: The patient's family history includes Prostate cancer in his father.  ROS:   Please see the history of present illness.  All other systems reviewed and are negative.  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 Ascending thoracic aortic aneurysm. Recommend semi-annual imaging followup by CTA  Carotid US /1/23 Summary:  Right Carotid: Velocities in the right ICA are consistent with a 1-39%  stenosis.   Left Carotid: The ECA appears >50% stenosed. The CCA and ICA (distal) were near-normal with only minimal wall thickening or plaque.  Left carotid stent is patent without evidence of stenosis.   Vertebrals:  Bilateral vertebral arteries demonstrate antegrade flow.  Subclavians: Normal flow hemodynamics were seen in bilateral subclavian arteries.   FFRCT Analysis 06/16/2021: FINDINGS: FFRct analysis was performed on the original cardiac CT angiogram dataset. Diagrammatic representation of the FFRct analysis  is provided in a separate PDF document in PACS. This dictation was created using the PDF document and an interactive 3D model of the results. 3D model is not available in the EMR/PACS. Normal FFR range is >0.80.   Left Main: findings LM FFR = 0.98.   LAD: No significant stenosis. Proximal FFR =0.91, Mid FFR = 0.85. The dista LAD could not be modeled.   LCX: Possible significant stenosis in the mid to distal LCx. Proximal FFR = 0.98, Mid FFR =0.62, Distal FFR = 0.55. Normal flow in large OM1.   RCA: No significant stenosis. Proximal FFR =0.99, Mid FFR = 0.93, Distal FFR = 0.90. Prox PDA FFR = 0.90, distal PDA FFR = 0.76.   IMPRESSION: 1. Coronary CTA FFR flow analysis demonstrates possible flow limiting lesion in the mid LCx. Proximal LCx FFR 0.90>>Mid LCx FFR 0.62.   2. Recommend aggressive antianginal therapy and consideration of cardiac catheterization.  Cardiac CT w/Calcium Score 06/16/2021: FINDINGS: Image quality: Excellent.   Noise artifact is: Limited.   Coronary Arteries:  Normal coronary origin.  Right dominance.   Left main: The left main is a  large caliber vessel with a normal take off from the left coronary cusp that bifurcates to form a left anterior descending artery and a left circumflex artery. trifurcates into a LAD, LCX, and ramus intermedius. There is no plaque or stenosis.   Left anterior descending artery: The LAD gives off 2 patent diagonal branches. There is moderate and possibly severe stenosis of the proximal LAD with associated stenosis of 50-69% and may be >70%. There is significant blooming artifact which may over estimate degree of stenosis. There is mild calcified plaque in the mid LAD with associated stenosis of 25-49%.   Ramus intermedius: Patent with no evidence of plaque or stenosis.   Left circumflex artery: The LCX is non-dominant with possible high grade non calcified plaque in the proximal vessel with 70-99% stenosis. The LCX gives off 3 obtuse marginal branches. The first OM1 is now well visualized.   Right coronary artery: The RCA is dominant with normal take off from the right coronary cusp. The RCA terminates as a PDA and right posterolateral branch. There is moderate calcified plaque in the proximal RCA with associated stenosis of 50-69%. There is mild calcified plaque in the proximal to mid RCA with associated stenosis of 25-49%.   Right Atrium: Right atrial size is within normal limits.   Right Ventricle: The right ventricular cavity is within normal limits.   Left Atrium: Left atrial size is normal in size with no left atrial appendage filling defect.   Left Ventricle: The ventricular cavity size is within normal limits. There are no stigmata of prior infarction. There is no abnormal filling defect.   Pulmonary arteries: Normal in size without proximal filling defect.   Pulmonary veins: Normal pulmonary venous drainage.   Pericardium: Normal thickness with no significant effusion or calcium present.   Cardiac valves: The mitral valve is normal structure with severe mitral  annular calcifications. The Aortic valve has been replaced with a 25mm Edwards Inspiris bioprosthetic valve.   Aorta: Normal caliber. Scattered calcifications of the ascending and descending aorta.   Extra-cardiac findings: See attached radiology report for non-cardiac structures.   IMPRESSION: 1. Coronary calcium score of 300. This was 54th percentile for age-, sex, and race-matched controls.   2. Normal coronary origin with right dominance.   3. 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 CAD RADS 3.  RECOMMENDATIONS: This study has been submitted for FFR flow analysis.   Consider symptom guided anti-ischemic and preventive pharmacotherapy as well as risk factor modification per guideline-directed care.  Carotid stent post stroke 03/28/2021: FINDINGS:  The left common carotid arteriogram again demonstrates the left  external carotid origin to be narrowed by 50%. Its branches are  normally opacified.     The left internal carotid artery just distal to the bulb again  demonstrates a severe high-grade stenosis associated with a mild  ulceration and filling defect along the posterolateral wall. More  distally there is advancement of contrast to the cranial skull base.     The petrous, cavernous and supraclinoid segments are widely patent  with mild arteriosclerotic changes involving the proximal cavernous  segment.     The left middle cerebral artery opacifies into the capillary and  venous phases with a delayed focal occlusion of a distal M4 branch  of the inferior division.     Transient antegrade filling is seen of the right anterior cerebral  A1 segment.   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   Carotid Duplex 03/26/2021: Summary:  Right Carotid: Velocities in the right ICA are  consistent with a 1-39%  stenosis.   Left Carotid: Velocities in the left ICA are consistent with a 80-99%  stenosis. Significant progression of disease when compared to previous  study 12/16/2017.   Vertebrals:  Bilateral vertebral arteries demonstrate antegrade flow.  Subclavians: Normal flow hemodynamics were seen in bilateral subclavian arteries.   Echo 03/26/2021:  1. Left ventricular ejection fraction, by estimation, is 60 to 65%. The  left ventricle has normal function. The left ventricle has no regional  wall motion abnormalities. There is mild left ventricular hypertrophy.  Left ventricular diastolic parameters  are consistent with Grade I diastolic dysfunction (impaired relaxation).   2. Right ventricular systolic function is normal. The right ventricular  size is normal.   3. Left atrial size was mildly dilated.   4. The mitral valve is normal in structure. No evidence of mitral valve  regurgitation. No evidence of mitral stenosis.   5. 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 Mean  gradient 12 mmHg with no significant regurgitation.   6. Aortic dilatation noted. There is mild dilatation of the ascending  aorta, measuring 41 mm.   7. The inferior vena cava is normal in size with greater than 50%  respiratory variability, suggesting right atrial pressure of 3 mmHg.   03/18/21 Exercise Tolerance Test:  Study Highlights Blood pressure demonstrated a normal response to exercise. ST segment depression of 5 mm was noted during stress in the II, III, aVF, V5 and V6 leads.   ETT with fair exercise tolerance (6:42); no chest pain but patient did complain of dyspnea; normal blood pressure response; 3 beats nonsustained ventricular tachycardia and frequent PVCs with exercise and 5 beats nonsustained ventricular tachycardia in recovery; 4 to 5 mm of ST depression in the inferior lateral leads and 3 mm ST elevation in AvR; abnormal ETT. Results discussed with Dr  Anne Fu.  07/24/2020 Echo:  IMPRESSIONS   1. Frequent PVC's are noted. Left ventricular ejection fraction, by  estimation, is 65 to 70%. The left ventricle has normal function. The left  ventricle has no regional wall motion abnormalities. There is mild  eccentric left ventricular hypertrophy of  the basal-septal segment. Left  ventricular diastolic parameters are  consistent with Grade I diastolic dysfunction (impaired relaxation).   2. The mitral valve is abnormal. Trivial mitral valve regurgitation.   3. The aortic valve has been repaired/replaced. There is a 25 mm Edwards  Inspiris bioprosthetic valve noted in the aortic position. Aortic valve  regurgitation is not visualized. Aortic valve area, by VTI measures 2.19  cm. Aortic valve mean gradient  measures 10.0 mmHg. Aortic valve Vmax measures 2.38 m/s. DI is 0.45.   4. Aortic dilatation noted. There is mild dilatation of the aortic root,  measuring 40 mm. There is mild to moderate dilatation of the ascending  aorta, measuring 44 mm.   5. Right ventricular systolic function is mildly reduced. The right  ventricular size is normal. There is normal pulmonary artery systolic  pressure.   6. The inferior vena cava is normal in size with greater than 50%  respiratory variability, suggesting right atrial pressure of 3 mmHg.   Comparison(s): Changes from prior study are noted. 01/25/2018: LVEF 60-65%,  bioprosthetic AVR - mean gradient 11 mmHg, aorta dilated to 42 mm.   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 This does not appear to impinge upon the left coronary artery.   Stable thoracic aortic aneurysm with maximal diameter of 4.0 x 4.0 cm.   EKG:   01/27/2023-sinus rhythm PVCs noted 73 bpm nonspecific ST-T wave changes 06/19/2021: EKG was not ordered today. 03/26/2021: showed heart rate of 68 bpm with nonspecific ST-T wave depression  Recent Labs: 06/02/2022: ALT  24; Hemoglobin 16.1; Platelets 243   Recent Lipid Panel    Component Value Date/Time   CHOL 130 11/19/2021 1105   TRIG 154 (H) 11/19/2021 1105   HDL 42 11/19/2021 1105   CHOLHDL 3.1 11/19/2021 1105   CHOLHDL 3.9 03/26/2021 0345   VLDL 24 03/26/2021 0345   LDLCALC 62 11/19/2021 1105     Risk Assessment/Calculations:          Physical Exam:    VS:  BP 126/78   Pulse 73   Ht 5\' 11"  (1.803 m)   Wt 195 lb 6.4 oz (88.6 kg)   SpO2 98%   BMI 27.25 kg/m     Wt Readings from Last 3 Encounters:  01/27/23 195 lb 6.4 oz (88.6 kg)  01/20/23 197 lb (89.4 kg)  01/06/22 195 lb (88.5 kg)    GEN: Well nourished, well developed in no acute distress HEENT: Normal NECK: No JVD; No carotid bruits LYMPHATICS: No lymphadenopathy CARDIAC: RRR, 2/6 systolic murmur, no rubs, gallops, no changes RESPIRATORY:  Clear to auscultation without rales, wheezing or rhonchi  ABDOMEN: Soft, non-tender, non-distended MUSCULOSKELETAL:  No edema; No deformity  SKIN: Warm and dry NEUROLOGIC:  Alert and oriented x 3 PSYCHIATRIC:  Normal affect   ASSESSMENT:    1. Coronary artery disease involving native coronary artery of native heart without angina pectoris   2. Pure hypercholesterolemia   3. S/P aortic valve replacement with bioprosthetic valve        PLAN:     Pure hypercholesterolemia Previously LDL was 91.  We increased his atorvastatin from 10 up to 40, high intensity dose.  We will repeat lipid panel today.  No myalgias.  LDL goal less than 70.  S/P aortic valve replacement with bioprosthetic valve Bioprosthetic valve 25 mm.  Doing well.  Echocardiogram reviewed.  Murmur heard on exam.  Continue with dental prophylaxis. Aortic root 45 from 42. 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  Plavix and aspirin.  Vascular ultrasound reviewed from 2023.  Widely patent stent.   Continue with aggressive medical management. No changes  CAD (coronary artery disease) Prior CT scan reviewed.  Circumflex artery FFR was abnormal however the arteries appear fairly small in caliber.  He is doing very well without any symptoms currently.  Continue with aggressive medical management. After stopping Brilinta, his shortness of breath went away.  He is now on Plavix.  Excellent. No CP no SOB.    Medication Adjustments/Labs and Tests Ordered: Current medicines are reviewed at length with the patient today.  Concerns regarding medicines are outlined above.   Orders Placed This Encounter  Procedures   EKG 12-Lead     No orders of the defined types were placed in this encounter.    Patient Instructions  Medication Instructions:  Your physician recommends that you continue on your current medications as directed. Please refer to the Current Medication list given to you today.  *If you need a refill on your cardiac medications before your next appointment, please call your pharmacy*  Follow-Up: At [facility], you and your health needs are our priority.  As part of our continuing mission to provide you with exceptional heart care, we have created designated Provider Care Teams.  These Care Teams include your primary Cardiologist (physician) and Advanced Practice Providers (APPs -  Physician Assistants and Nurse Practitioners) who all work together to provide you with the care you need, when you need it.   Your next appointment:   1 year(s)  Provider:   Donato Schultz, MD       Sherin Quarry as a scribe for Donato Schultz, MD.,have documented all relevant documentation on the behalf of Donato Schultz, MD,as directed by  Donato Schultz, MD while in the presence of Donato Schultz, MD.  I, Donato Schultz, MD, have reviewed all documentation for this visit. The documentation on 01/27/23 for the exam, diagnosis, procedures, and orders are all accurate and complete.    Signed, Donato Schultz, MD  01/27/2023 11:18 AM    [facility]

## 2023-01-27 NOTE — Patient Instructions (Signed)
Medication Instructions:  Your physician recommends that you continue on your current medications as directed. Please refer to the Current Medication list given to you today.  *If you need a refill on your cardiac medications before your next appointment, please call your pharmacy*   Follow-Up: At [facility], you and your health needs are our priority.  As part of our continuing mission to provide you with exceptional heart care, we have created designated Provider Care Teams.  These Care Teams include your primary Cardiologist (physician) and Advanced Practice Providers (APPs -  Physician Assistants and Nurse Practitioners) who all work together to provide you with the care you need, when you need it.   Your next appointment:   1 year(s)  Provider:   Mark Skains, MD     

## 2023-01-29 ENCOUNTER — Other Ambulatory Visit: Payer: Self-pay | Admitting: Cardiology

## 2023-02-02 ENCOUNTER — Other Ambulatory Visit: Payer: Self-pay | Admitting: Cardiology

## 2023-03-22 DIAGNOSIS — H5203 Hypermetropia, bilateral: Secondary | ICD-10-CM | POA: Diagnosis not present

## 2023-03-22 DIAGNOSIS — Z961 Presence of intraocular lens: Secondary | ICD-10-CM | POA: Diagnosis not present

## 2023-03-22 DIAGNOSIS — E119 Type 2 diabetes mellitus without complications: Secondary | ICD-10-CM | POA: Diagnosis not present

## 2023-04-06 DIAGNOSIS — H509 Unspecified strabismus: Secondary | ICD-10-CM | POA: Diagnosis not present

## 2023-05-31 DIAGNOSIS — Z1283 Encounter for screening for malignant neoplasm of skin: Secondary | ICD-10-CM | POA: Diagnosis not present

## 2023-05-31 DIAGNOSIS — L820 Inflamed seborrheic keratosis: Secondary | ICD-10-CM | POA: Diagnosis not present

## 2023-05-31 DIAGNOSIS — D225 Melanocytic nevi of trunk: Secondary | ICD-10-CM | POA: Diagnosis not present

## 2023-06-16 ENCOUNTER — Other Ambulatory Visit: Payer: Self-pay | Admitting: Cardiothoracic Surgery

## 2023-06-16 DIAGNOSIS — I7121 Aneurysm of the ascending aorta, without rupture: Secondary | ICD-10-CM

## 2023-06-29 DIAGNOSIS — E7800 Pure hypercholesterolemia, unspecified: Secondary | ICD-10-CM | POA: Diagnosis not present

## 2023-06-29 DIAGNOSIS — I719 Aortic aneurysm of unspecified site, without rupture: Secondary | ICD-10-CM | POA: Diagnosis not present

## 2023-06-29 DIAGNOSIS — Z23 Encounter for immunization: Secondary | ICD-10-CM | POA: Diagnosis not present

## 2023-06-29 DIAGNOSIS — I10 Essential (primary) hypertension: Secondary | ICD-10-CM | POA: Diagnosis not present

## 2023-06-29 DIAGNOSIS — I480 Paroxysmal atrial fibrillation: Secondary | ICD-10-CM | POA: Diagnosis not present

## 2023-06-29 DIAGNOSIS — E119 Type 2 diabetes mellitus without complications: Secondary | ICD-10-CM | POA: Diagnosis not present

## 2023-07-07 ENCOUNTER — Telehealth (HOSPITAL_COMMUNITY): Payer: Self-pay

## 2023-07-07 NOTE — Telephone Encounter (Signed)
Called to schedule US carotid, no answer, left vm. AB

## 2023-07-08 ENCOUNTER — Other Ambulatory Visit (HOSPITAL_COMMUNITY): Payer: Self-pay | Admitting: Interventional Radiology

## 2023-07-08 DIAGNOSIS — I771 Stricture of artery: Secondary | ICD-10-CM

## 2023-07-16 ENCOUNTER — Ambulatory Visit
Admission: RE | Admit: 2023-07-16 | Discharge: 2023-07-16 | Disposition: A | Discharge status: Home / Self Care | Payer: Medicare HMO | Source: Ambulatory Visit | Attending: Cardiothoracic Surgery | Admitting: Cardiothoracic Surgery

## 2023-07-16 DIAGNOSIS — I700 Atherosclerosis of aorta: Secondary | ICD-10-CM | POA: Diagnosis not present

## 2023-07-16 DIAGNOSIS — I7121 Aneurysm of the ascending aorta, without rupture: Secondary | ICD-10-CM | POA: Diagnosis not present

## 2023-07-16 DIAGNOSIS — Z952 Presence of prosthetic heart valve: Secondary | ICD-10-CM | POA: Diagnosis not present

## 2023-07-16 DIAGNOSIS — I2510 Atherosclerotic heart disease of native coronary artery without angina pectoris: Secondary | ICD-10-CM | POA: Diagnosis not present

## 2023-07-16 MED ORDER — IOPAMIDOL (ISOVUE-370) INJECTION 76%
200.0000 mL | Freq: Once | INTRAVENOUS | Status: AC | PRN
  Administered 2023-07-16: 75 mL via INTRAVENOUS

## 2023-07-26 ENCOUNTER — Ambulatory Visit: Payer: Medicare HMO | Admitting: Cardiothoracic Surgery

## 2023-07-26 ENCOUNTER — Encounter: Payer: Self-pay | Admitting: Cardiothoracic Surgery

## 2023-07-26 VITALS — BP 155/66 | HR 56 | Resp 20 | Wt 195.0 lb

## 2023-07-26 DIAGNOSIS — I712 Thoracic aortic aneurysm, without rupture, unspecified: Secondary | ICD-10-CM | POA: Insufficient documentation

## 2023-07-26 DIAGNOSIS — I7121 Aneurysm of the ascending aorta, without rupture: Secondary | ICD-10-CM

## 2023-07-26 NOTE — Progress Notes (Signed)
HPI: The patient presents for scheduled visit with CT of the thoracic aorta for a longstanding asymptomatic moderate fusiform ascending thoracic aneurysm.  He was last seen about a year and a half ago at which time the aortic diameter was 4.5 cm.  The current scan performed this month demonstrates no change in the diameter of the thoracic aorta and no features of mural thickening or hematoma or ulceration.  It measures at 4.5 cm. Since his last visit the patient had a left hemispheric stroke treated with left carotid stent and fortunately minimal functional deficit. In 2019 the patient had aortic valve replacement for aortic stenosis and has done well.  His last echocardiogram in 2022 showed normal functioning of the valve with minimal gradient.  The patient denies chest pain shortness of breath or palpitations.  Patient has well-controlled hypertension with low-dose lisinopril.   Current Outpatient Medications  Medication Sig Dispense Refill   acetaminophen (TYLENOL) 500 MG tablet Take 2 tablets (1,000 mg total) by mouth every 6 (six) hours as needed. 30 tablet 0   amoxicillin (AMOXIL) 500 MG capsule TAKE 4 TABLETS BY MOUTH 1 HOUR BEFORE DENTAL PROCEDURE 4 capsule 3   aspirin EC 81 MG tablet Take 81 mg by mouth in the morning. Swallow whole.     atorvastatin (LIPITOR) 40 MG tablet Take 1 tablet (40 mg total) by mouth in the morning. 30 tablet 0   clopidogrel (PLAVIX) 75 MG tablet Take 1 tablet by mouth once daily 90 tablet 3   hydrochlorothiazide (HYDRODIURIL) 12.5 MG tablet Take 1 tablet by mouth once daily 90 tablet 3   hydrocortisone cream 1 % Apply 1 application topically daily as needed for itching.     lisinopril (ZESTRIL) 5 MG tablet Take 2.5 mg by mouth in the morning.     metformin (FORTAMET) 500 MG (OSM) 24 hr tablet Take 500 mg by mouth daily with breakfast.     neomycin-bacitracin-polymyxin (NEOSPORIN) ointment Apply 1 application topically daily as needed for wound care.      oxymetazoline (AFRIN) 0.05 % nasal spray Place 1 spray into both nostrils daily as needed for congestion.     No current facility-administered medications for this visit.     Physical Exam: Blood pressure (!) 155/66, pulse (!) 56, resp. rate 20, weight 195 lb (88.5 kg), SpO2 97%.  Exam Alert and appropriate Lungs clear Heart rate regular Soft 1/6 systolic flow murmur without diastolic murmur or gallop Extremities warm with good pulses No pedal edema  Diagnostic Tests: Images of CTA of the thoracic aorta personally reviewed and counseled with the patient.  As stated above his aorta remains stable at 4.5 cm diameter.  Impression: Stable asymptomatic moderate fusiform ascending aneurysm Well-controlled hypertension History of aortic valve replacement for aortic stenosis Type 2 diabetes mellitus Left carotid stenosis with CVA, treated with carotid stent and with minimal deficit.  Plan: Patient understands importance of blood pressure control going forward to control further expansion of the thoracic aorta.  We will plan to repeat CT scan in 12 months.   Lovett Sox, MD [facility] [phone]

## 2023-07-28 ENCOUNTER — Ambulatory Visit (HOSPITAL_COMMUNITY)
Admission: RE | Admit: 2023-07-28 | Discharge: 2023-07-28 | Disposition: A | Discharge status: Home / Self Care | Payer: Medicare HMO | Source: Ambulatory Visit | Attending: Interventional Radiology | Admitting: Interventional Radiology

## 2023-07-28 DIAGNOSIS — I771 Stricture of artery: Secondary | ICD-10-CM | POA: Insufficient documentation

## 2023-07-28 NOTE — Progress Notes (Addendum)
Carotid artery duplex completed. Please see CV Procedures for preliminary results.  Shona Simpson, RVT 07/28/23 10:22 AM

## 2023-09-23 ENCOUNTER — Telehealth (HOSPITAL_COMMUNITY): Payer: Self-pay

## 2023-09-23 NOTE — Telephone Encounter (Signed)
Pt agreed to f/u in 6 months with a us carotid. AB  

## 2023-12-29 DIAGNOSIS — Z1211 Encounter for screening for malignant neoplasm of colon: Secondary | ICD-10-CM | POA: Diagnosis not present

## 2023-12-29 DIAGNOSIS — Z6827 Body mass index (BMI) 27.0-27.9, adult: Secondary | ICD-10-CM | POA: Diagnosis not present

## 2023-12-29 DIAGNOSIS — E7800 Pure hypercholesterolemia, unspecified: Secondary | ICD-10-CM | POA: Diagnosis not present

## 2023-12-29 DIAGNOSIS — Z0000 Encounter for general adult medical examination without abnormal findings: Secondary | ICD-10-CM | POA: Diagnosis not present

## 2023-12-29 DIAGNOSIS — I480 Paroxysmal atrial fibrillation: Secondary | ICD-10-CM | POA: Diagnosis not present

## 2023-12-29 DIAGNOSIS — E1169 Type 2 diabetes mellitus with other specified complication: Secondary | ICD-10-CM | POA: Diagnosis not present

## 2023-12-29 DIAGNOSIS — I10 Essential (primary) hypertension: Secondary | ICD-10-CM | POA: Diagnosis not present

## 2024-01-27 ENCOUNTER — Other Ambulatory Visit: Payer: Self-pay | Admitting: Cardiology

## 2024-01-30 ENCOUNTER — Other Ambulatory Visit: Payer: Self-pay | Admitting: Cardiology

## 2024-03-04 ENCOUNTER — Encounter (HOSPITAL_COMMUNITY): Payer: Self-pay | Admitting: Interventional Radiology

## 2024-03-23 DIAGNOSIS — E119 Type 2 diabetes mellitus without complications: Secondary | ICD-10-CM | POA: Diagnosis not present

## 2024-03-23 DIAGNOSIS — H524 Presbyopia: Secondary | ICD-10-CM | POA: Diagnosis not present

## 2024-04-10 DIAGNOSIS — L720 Epidermal cyst: Secondary | ICD-10-CM | POA: Diagnosis not present

## 2024-04-10 DIAGNOSIS — L820 Inflamed seborrheic keratosis: Secondary | ICD-10-CM | POA: Diagnosis not present

## 2024-04-30 ENCOUNTER — Other Ambulatory Visit: Payer: Self-pay | Admitting: Cardiology

## 2024-05-01 ENCOUNTER — Other Ambulatory Visit: Payer: Self-pay | Admitting: Cardiology

## 2024-05-02 ENCOUNTER — Ambulatory Visit: Attending: Cardiology | Admitting: Cardiology

## 2024-05-02 VITALS — BP 148/77 | HR 53 | Ht 71.0 in | Wt 191.0 lb

## 2024-05-02 DIAGNOSIS — I739 Peripheral vascular disease, unspecified: Secondary | ICD-10-CM

## 2024-05-02 DIAGNOSIS — E7800 Pure hypercholesterolemia, unspecified: Secondary | ICD-10-CM

## 2024-05-02 DIAGNOSIS — Z953 Presence of xenogenic heart valve: Secondary | ICD-10-CM | POA: Diagnosis not present

## 2024-05-02 DIAGNOSIS — I77810 Thoracic aortic ectasia: Secondary | ICD-10-CM | POA: Diagnosis not present

## 2024-05-02 DIAGNOSIS — Q2381 Bicuspid aortic valve: Secondary | ICD-10-CM

## 2024-05-02 DIAGNOSIS — I2510 Atherosclerotic heart disease of native coronary artery without angina pectoris: Secondary | ICD-10-CM

## 2024-05-02 MED ORDER — HYDROCHLOROTHIAZIDE 12.5 MG PO TABS: 12.5000 mg | ORAL_TABLET | Freq: Every day | ORAL | 3 refills | Status: AC

## 2024-05-02 NOTE — Patient Instructions (Signed)
 Medication Instructions:  The current medical regimen is effective;  continue present plan and medications.  *If you need a refill on your cardiac medications before your next appointment, please call your pharmacy*  Testing/Procedures: Your physician has requested that you have an echocardiogram. Echocardiography is a painless test that uses sound waves to create images of your heart. It provides your doctor with information about the size and shape of your heart and how well your heart's chambers and valves are working. This procedure takes approximately one hour. There are no restrictions for this procedure. Please do NOT wear cologne, perfume, aftershave, or lotions (deodorant is allowed). Please arrive 15 minutes prior to your appointment time.  Please note: We ask at that you not bring children with you during ultrasound (echo/ vascular) testing. Due to room size and safety concerns, children are not allowed in the ultrasound rooms during exams. Our front office staff cannot provide observation of children in our lobby area while testing is being conducted. An adult accompanying a patient to their appointment will only be allowed in the ultrasound room at the discretion of the ultrasound technician under special circumstances. We apologize for any inconvenience.  Follow-Up: At [facility], you and your health needs are our priority.  As part of our continuing mission to provide you with exceptional heart care, our providers are all part of one team.  This team includes your primary Cardiologist (physician) and Advanced Practice Providers or APPs (Physician Assistants and Nurse Practitioners) who all work together to provide you with the care you need, when you need it.  Your next appointment:   1 year(s)  Provider:   Dorothye Gathers, MD    We recommend signing up for the patient portal called "MyChart".  Sign up information is provided on this After Visit Summary.  MyChart is used to  connect with patients for Virtual Visits (Telemedicine).  Patients are able to view lab/test results, encounter notes, upcoming appointments, etc.  Non-urgent messages can be sent to your provider as well.   To learn more about what you can do with MyChart, go to [email or web address].

## 2024-05-02 NOTE — Progress Notes (Signed)
 Cardiology Office Note:  .   Date:  05/02/2024  ID:  Stephen Mullins Chamber, DOB 01/24/1946, MRN [id number] PCP: Marvene Prentice SAUNDERS, FNP  [facility] Providers Cardiologist:  Oneil Parchment, MD    History of Present Illness: .   Stephen Mullins is a 78 y.o. male Discussed the use of AI scribe software for clinical note transcription with the patient, who gave verbal consent to proceed.  History of Present Illness Stephen Mullins is a 78 year old male with aortic valve replacement and coronary artery disease who presents for follow-up of his cardiovascular conditions.  He underwent aortic valve replacement in April 2019 with a 25 mm bioprosthetic valve due to a bicuspid aortic valve and aortic stenosis. He experiences occasional chest pain lasting about one second, occurring every few days, which he feels is located at the incision site. No shortness of breath is reported.  His ascending aorta has been monitored for dilation, with the last CT scan on July 16, 2023, showing stable measurements of 4.3 by 4.1 cm. He has a history of coronary artery disease with a coronary CT scan in October 2022 showing moderate disease of the LAD and circumflex arteries, with a coronary calcium  score of 300 (54th percentile).  He takes atorvastatin  40 mg daily for hyperlipidemia, hydrochlorothiazide  12.5 mg daily for hypertension, lisinopril  2.5 mg daily for renal protection and hypertension, aspirin  81 mg daily due to the bioprosthetic valve, and clopidogrel  75 mg daily for coronary artery disease.  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 He reports a gray area in his upper central vision in the left eye, which he compensates for with his right eye. 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  His LDL was 69, HDL 43, hemoglobin A1c 6.6, creatinine 0.8, hemoglobin 15.3, and ALT 21 as of  December 27, 2023.  He has a family history of bicuspid aortic valve, with his youngest son potentially having the condition.     Studies Reviewed: SABRA   EKG Interpretation Date/Time:  Tuesday May 02 2024 10:08:40 EDT Ventricular Rate:  53 PR Interval:  178 QRS Duration:  102 QT Interval:  430 QTC Calculation: 403 R Axis:   12  Text Interpretation: Sinus bradycardia with occasional Premature ventricular complexes Nonspecific ST and T wave abnormality When compared with ECG of 26-Mar-2021 10:19, No significant change since last tracing Confirmed by Parchment Oneil ([id number]) on 05/02/2024 10:15:10 AM    Results LABS LDL: 69 (12/27/2023) HDL: 43 (12/27/2023) Hemoglobin A1c: 6.6% (12/27/2023) Creatinine: 0.8 mg/dL (95/78/7974) Hemoglobin: 15.3 g/dL (95/78/7974) ALT: 21 U/L (12/27/2023)  RADIOLOGY CT scan: Dilated ascending aorta 4.3 x 4.1 cm, coronary calcification noted (07/16/2023) Coronary CT scan: Moderate disease of LAD and circumflex, non-flow limiting, coronary calcium  score 300, 54% (October 2022)  DIAGNOSTIC EKG: Normal Risk Assessment/Calculations:           Physical Exam:   VS:  BP (!) 148/77 (BP Location: Left Arm, Patient Position: Sitting, Cuff Size: Normal)   Pulse (!) 53   Ht 5' 11 (1.803 m)   Wt 191 lb (86.6 kg)   BMI 26.64 kg/m    Wt Readings from Last 3 Encounters:  05/02/24 191 lb (86.6 kg)  07/26/23 195 lb (88.5 kg)  01/27/23 195 lb 6.4 oz (88.6 kg)    GEN: Well nourished, well developed in no acute distress NECK: No JVD; No  carotid bruits CARDIAC: RRR, 1/6 systolic murmur, no rubs, no gallops RESPIRATORY:  Clear to auscultation without rales, wheezing or rhonchi  ABDOMEN: Soft, non-tender, non-distended EXTREMITIES:  No edema; No deformity   ASSESSMENT AND PLAN: .    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  Disease is non-obstructive, no stent required. - Continue aspirin  and clopidogrel  for coronary artery disease management.  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 Current valve function is stable with a mean gradient of 12 mmHg noted in 2022. - Order echocardiogram to assess valve function. - Inform that bioprosthetic valves typically last 15-20 years, but monitoring is essential to detect early signs of deterioration.  Stable ascending aortic dilation Ascending aorta measured 4.3 x 4.1 cm on last CT scan in August 2024, no significant change. No need for surgical intervention. - Monitor for changes. Inform that significant dilation could increase the risk of aortic dissection, but current measurements do not warrant intervention. Followed by CT surgery  Hyperlipidemia on statin therapy LDL cholesterol level is well-controlled at 69 mg/dL as of December 27, 2023. - Continue atorvastatin  40 mg daily.  Hypertension, well-controlled Blood pressure is well-controlled. - Continue hydrochlorothiazide  and lisinopril .  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         Dispo: 1 yr  Signed, Oneil Parchment, MD

## 2024-06-02 ENCOUNTER — Ambulatory Visit (HOSPITAL_COMMUNITY)
Admission: RE | Admit: 2024-06-02 | Discharge: 2024-06-02 | Disposition: A | Discharge status: Home / Self Care | Source: Ambulatory Visit | Attending: Internal Medicine | Admitting: Internal Medicine

## 2024-06-02 ENCOUNTER — Ambulatory Visit: Payer: Self-pay | Admitting: Cardiology

## 2024-06-02 DIAGNOSIS — I77810 Thoracic aortic ectasia: Secondary | ICD-10-CM | POA: Insufficient documentation

## 2024-06-02 DIAGNOSIS — Z953 Presence of xenogenic heart valve: Secondary | ICD-10-CM | POA: Insufficient documentation

## 2024-06-02 DIAGNOSIS — I340 Nonrheumatic mitral (valve) insufficiency: Secondary | ICD-10-CM | POA: Diagnosis not present

## 2024-06-02 LAB — ECHOCARDIOGRAM COMPLETE
AR max vel: 2.18 cm2
AV Area VTI: 2.37 cm2
AV Area mean vel: 2.19 cm2
AV Mean grad: 12 mmHg
AV Peak grad: 22.7 mmHg
Ao pk vel: 2.38 m/s
Area-P 1/2: 3.19 cm2
MV VTI: 2.35 cm2
S' Lateral: 3.9 cm

## 2024-07-06 ENCOUNTER — Other Ambulatory Visit: Payer: Self-pay | Admitting: *Deleted

## 2024-07-06 DIAGNOSIS — Z953 Presence of xenogenic heart valve: Secondary | ICD-10-CM

## 2024-07-06 DIAGNOSIS — Q2381 Bicuspid aortic valve: Secondary | ICD-10-CM

## 2024-07-06 DIAGNOSIS — I77810 Thoracic aortic ectasia: Secondary | ICD-10-CM

## 2024-10-11 NOTE — Progress Notes (Signed)
 Stephen Mullins                                          MRN: [id number]   10/11/2024   The [facility] Quality Team Specialist reviewed this patient medical record for the purposes of chart review for care gap closure. The following were reviewed: chart review for care gap closure-controlling blood pressure.    [facility] Quality Team
